# Patient Record
Sex: Female | Born: 1970
Health system: Southern US, Community
[De-identification: ages and names within clinical notes are randomized; demographics above are authoritative.]

## PROBLEM LIST (undated history)

## (undated) DIAGNOSIS — Z8614 Personal history of Methicillin resistant Staphylococcus aureus infection: Secondary | ICD-10-CM

## (undated) DIAGNOSIS — F429 Obsessive-compulsive disorder, unspecified: Secondary | ICD-10-CM

## (undated) DIAGNOSIS — D649 Anemia, unspecified: Secondary | ICD-10-CM

## (undated) DIAGNOSIS — I10 Essential (primary) hypertension: Secondary | ICD-10-CM

## (undated) DIAGNOSIS — M549 Dorsalgia, unspecified: Secondary | ICD-10-CM

## (undated) DIAGNOSIS — E1121 Type 2 diabetes mellitus with diabetic nephropathy: Secondary | ICD-10-CM

## (undated) DIAGNOSIS — E039 Hypothyroidism, unspecified: Secondary | ICD-10-CM

## (undated) DIAGNOSIS — Z8669 Personal history of other diseases of the nervous system and sense organs: Secondary | ICD-10-CM

## (undated) DIAGNOSIS — M79605 Pain in left leg: Secondary | ICD-10-CM

## (undated) DIAGNOSIS — M79604 Pain in right leg: Secondary | ICD-10-CM

## (undated) DIAGNOSIS — F319 Bipolar disorder, unspecified: Secondary | ICD-10-CM

## (undated) DIAGNOSIS — M1990 Unspecified osteoarthritis, unspecified site: Secondary | ICD-10-CM

## (undated) DIAGNOSIS — F32A Depression, unspecified: Secondary | ICD-10-CM

## (undated) DIAGNOSIS — K759 Inflammatory liver disease, unspecified: Secondary | ICD-10-CM

## (undated) DIAGNOSIS — E669 Obesity, unspecified: Secondary | ICD-10-CM

## (undated) DIAGNOSIS — K219 Gastro-esophageal reflux disease without esophagitis: Secondary | ICD-10-CM

## (undated) DIAGNOSIS — E079 Disorder of thyroid, unspecified: Secondary | ICD-10-CM

## (undated) DIAGNOSIS — F909 Attention-deficit hyperactivity disorder, unspecified type: Secondary | ICD-10-CM

## (undated) DIAGNOSIS — E119 Type 2 diabetes mellitus without complications: Secondary | ICD-10-CM

## (undated) DIAGNOSIS — M5430 Sciatica, unspecified side: Secondary | ICD-10-CM

## (undated) DIAGNOSIS — F329 Major depressive disorder, single episode, unspecified: Secondary | ICD-10-CM

## (undated) DIAGNOSIS — F419 Anxiety disorder, unspecified: Secondary | ICD-10-CM

## (undated) DIAGNOSIS — G8929 Other chronic pain: Secondary | ICD-10-CM

## (undated) DIAGNOSIS — F4010 Social phobia, unspecified: Secondary | ICD-10-CM

## (undated) DIAGNOSIS — L719 Rosacea, unspecified: Secondary | ICD-10-CM

## (undated) DIAGNOSIS — T83718A Erosion of other implanted mesh and other prosthetic materials to surrounding organ or tissue, initial encounter: Secondary | ICD-10-CM

## (undated) DIAGNOSIS — R300 Dysuria: Secondary | ICD-10-CM

## (undated) HISTORY — DX: Attention-deficit hyperactivity disorder, unspecified type: F90.9

## (undated) HISTORY — PX: SPINAL CORD STIMULATOR REMOVAL: SHX2423

## (undated) HISTORY — PX: EYE SURGERY: SHX253

## (undated) HISTORY — PX: BACK SURGERY: SHX140

## (undated) HISTORY — DX: Anxiety disorder, unspecified: F41.9

## (undated) HISTORY — PX: INCONTINENCE SURGERY: SHX676

## (undated) HISTORY — PX: SPINAL CORD STIMULATOR INSERTION: SHX5378

## (undated) SURGERY — ULTRASOUND, UPPER GI TRACT, ENDOSCOPIC
Anesthesia: Monitor Anesthesia Care | Laterality: Left

---

## 1998-01-12 ENCOUNTER — Ambulatory Visit (HOSPITAL_COMMUNITY): Admission: RE | Admit: 1998-01-12 | Discharge: 1998-01-13 | Payer: Self-pay | Admitting: Orthopaedic Surgery

## 1999-04-15 ENCOUNTER — Other Ambulatory Visit: Admission: RE | Admit: 1999-04-15 | Discharge: 1999-04-15 | Payer: Self-pay | Admitting: Obstetrics & Gynecology

## 1999-06-10 ENCOUNTER — Encounter: Payer: Self-pay | Admitting: Obstetrics and Gynecology

## 1999-06-10 ENCOUNTER — Ambulatory Visit (HOSPITAL_COMMUNITY): Admission: RE | Admit: 1999-06-10 | Discharge: 1999-06-10 | Payer: Self-pay | Admitting: Obstetrics and Gynecology

## 1999-10-31 ENCOUNTER — Inpatient Hospital Stay (HOSPITAL_COMMUNITY): Admission: AD | Admit: 1999-10-31 | Discharge: 1999-11-03 | Payer: Self-pay | Admitting: Obstetrics and Gynecology

## 1999-11-05 ENCOUNTER — Encounter: Admission: RE | Admit: 1999-11-05 | Discharge: 2000-01-11 | Payer: Self-pay | Admitting: Obstetrics and Gynecology

## 2001-05-04 ENCOUNTER — Ambulatory Visit (HOSPITAL_COMMUNITY): Admission: RE | Admit: 2001-05-04 | Discharge: 2001-05-04 | Payer: Self-pay | Admitting: Orthopaedic Surgery

## 2001-05-25 ENCOUNTER — Inpatient Hospital Stay (HOSPITAL_COMMUNITY): Admission: RE | Admit: 2001-05-25 | Discharge: 2001-05-26 | Payer: Self-pay | Admitting: Orthopaedic Surgery

## 2002-12-25 ENCOUNTER — Other Ambulatory Visit: Admission: RE | Admit: 2002-12-25 | Discharge: 2002-12-25 | Payer: Self-pay | Admitting: Obstetrics and Gynecology

## 2003-01-15 ENCOUNTER — Encounter: Payer: Self-pay | Admitting: Ophthalmology

## 2003-01-17 ENCOUNTER — Ambulatory Visit (HOSPITAL_COMMUNITY): Admission: RE | Admit: 2003-01-17 | Discharge: 2003-01-17 | Payer: Self-pay | Admitting: Ophthalmology

## 2003-01-17 ENCOUNTER — Encounter (INDEPENDENT_AMBULATORY_CARE_PROVIDER_SITE_OTHER): Payer: Self-pay | Admitting: *Deleted

## 2004-03-29 ENCOUNTER — Other Ambulatory Visit: Admission: RE | Admit: 2004-03-29 | Discharge: 2004-03-29 | Payer: Self-pay | Admitting: Obstetrics and Gynecology

## 2004-09-05 HISTORY — PX: TUBAL LIGATION: SHX77

## 2004-11-17 ENCOUNTER — Ambulatory Visit (HOSPITAL_COMMUNITY): Admission: RE | Admit: 2004-11-17 | Discharge: 2004-11-17 | Payer: Self-pay | Admitting: Obstetrics and Gynecology

## 2005-01-07 ENCOUNTER — Inpatient Hospital Stay (HOSPITAL_COMMUNITY): Admission: AD | Admit: 2005-01-07 | Discharge: 2005-01-08 | Payer: Self-pay | Admitting: Obstetrics and Gynecology

## 2005-03-07 ENCOUNTER — Inpatient Hospital Stay (HOSPITAL_COMMUNITY): Admission: AD | Admit: 2005-03-07 | Discharge: 2005-03-08 | Payer: Self-pay | Admitting: Obstetrics and Gynecology

## 2005-03-26 ENCOUNTER — Inpatient Hospital Stay (HOSPITAL_COMMUNITY): Admission: AD | Admit: 2005-03-26 | Discharge: 2005-03-29 | Payer: Self-pay | Admitting: Obstetrics and Gynecology

## 2005-03-28 ENCOUNTER — Encounter (INDEPENDENT_AMBULATORY_CARE_PROVIDER_SITE_OTHER): Payer: Self-pay | Admitting: *Deleted

## 2005-05-10 ENCOUNTER — Other Ambulatory Visit: Admission: RE | Admit: 2005-05-10 | Discharge: 2005-05-10 | Payer: Self-pay | Admitting: Obstetrics and Gynecology

## 2006-06-09 ENCOUNTER — Other Ambulatory Visit: Admission: RE | Admit: 2006-06-09 | Discharge: 2006-06-09 | Payer: Self-pay | Admitting: Obstetrics and Gynecology

## 2006-06-13 ENCOUNTER — Encounter: Admission: RE | Admit: 2006-06-13 | Discharge: 2006-06-13 | Payer: Self-pay | Admitting: Obstetrics and Gynecology

## 2006-09-05 HISTORY — PX: SPINAL FUSION: SHX223

## 2006-12-08 ENCOUNTER — Ambulatory Visit (HOSPITAL_COMMUNITY): Admission: RE | Admit: 2006-12-08 | Discharge: 2006-12-08 | Payer: Self-pay | Admitting: Ophthalmology

## 2007-01-15 ENCOUNTER — Encounter (INDEPENDENT_AMBULATORY_CARE_PROVIDER_SITE_OTHER): Payer: Self-pay | Admitting: Specialist

## 2007-01-15 ENCOUNTER — Ambulatory Visit (HOSPITAL_BASED_OUTPATIENT_CLINIC_OR_DEPARTMENT_OTHER): Admission: RE | Admit: 2007-01-15 | Discharge: 2007-01-15 | Payer: Self-pay | Admitting: Ophthalmology

## 2007-01-30 ENCOUNTER — Ambulatory Visit (HOSPITAL_COMMUNITY): Admission: RE | Admit: 2007-01-30 | Discharge: 2007-01-31 | Payer: Self-pay | Admitting: Neurosurgery

## 2007-02-19 ENCOUNTER — Encounter: Admission: RE | Admit: 2007-02-19 | Discharge: 2007-02-19 | Payer: Self-pay | Admitting: Neurosurgery

## 2007-02-27 ENCOUNTER — Ambulatory Visit (HOSPITAL_COMMUNITY): Admission: RE | Admit: 2007-02-27 | Discharge: 2007-03-01 | Payer: Self-pay | Admitting: Neurosurgery

## 2007-03-26 ENCOUNTER — Emergency Department (HOSPITAL_COMMUNITY): Admission: EM | Admit: 2007-03-26 | Discharge: 2007-03-26 | Payer: Self-pay | Admitting: Emergency Medicine

## 2007-04-12 ENCOUNTER — Encounter: Admission: RE | Admit: 2007-04-12 | Discharge: 2007-04-12 | Payer: Self-pay | Admitting: Neurological Surgery

## 2007-05-11 ENCOUNTER — Inpatient Hospital Stay (HOSPITAL_COMMUNITY): Admission: AD | Admit: 2007-05-11 | Discharge: 2007-05-14 | Payer: Self-pay | Admitting: Neurological Surgery

## 2007-06-19 ENCOUNTER — Encounter: Admission: RE | Admit: 2007-06-19 | Discharge: 2007-06-19 | Payer: Self-pay | Admitting: Neurological Surgery

## 2007-08-10 ENCOUNTER — Encounter: Admission: RE | Admit: 2007-08-10 | Discharge: 2007-08-10 | Payer: Self-pay | Admitting: Neurological Surgery

## 2007-09-06 HISTORY — PX: OTHER SURGICAL HISTORY: SHX169

## 2007-09-07 ENCOUNTER — Encounter: Admission: RE | Admit: 2007-09-07 | Discharge: 2007-09-07 | Payer: Self-pay | Admitting: Neurological Surgery

## 2007-11-13 ENCOUNTER — Encounter: Admission: RE | Admit: 2007-11-13 | Discharge: 2007-11-13 | Payer: Self-pay | Admitting: Neurological Surgery

## 2008-02-11 ENCOUNTER — Encounter: Admission: RE | Admit: 2008-02-11 | Discharge: 2008-02-11 | Payer: Self-pay | Admitting: Neurological Surgery

## 2008-05-05 ENCOUNTER — Encounter: Admission: RE | Admit: 2008-05-05 | Discharge: 2008-05-05 | Payer: Self-pay | Admitting: Neurological Surgery

## 2008-12-01 ENCOUNTER — Emergency Department (HOSPITAL_COMMUNITY): Admission: EM | Admit: 2008-12-01 | Discharge: 2008-12-01 | Payer: Self-pay | Admitting: Emergency Medicine

## 2008-12-10 ENCOUNTER — Other Ambulatory Visit: Payer: Self-pay

## 2008-12-10 ENCOUNTER — Inpatient Hospital Stay (HOSPITAL_COMMUNITY): Admission: RE | Admit: 2008-12-10 | Discharge: 2008-12-12 | Payer: Self-pay | Admitting: *Deleted

## 2008-12-10 ENCOUNTER — Ambulatory Visit: Payer: Self-pay | Admitting: *Deleted

## 2009-02-15 ENCOUNTER — Emergency Department: Payer: Self-pay | Admitting: Unknown Physician Specialty

## 2010-09-27 ENCOUNTER — Encounter
Admission: RE | Admit: 2010-09-27 | Discharge: 2010-09-27 | Payer: Self-pay | Source: Home / Self Care | Attending: Orthopaedic Surgery | Admitting: Orthopaedic Surgery

## 2010-10-30 ENCOUNTER — Emergency Department (HOSPITAL_COMMUNITY)
Admission: EM | Admit: 2010-10-30 | Discharge: 2010-10-30 | Disposition: A | Discharge status: Home / Self Care | Payer: Medicare Other | Attending: Emergency Medicine | Admitting: Emergency Medicine

## 2010-10-30 ENCOUNTER — Emergency Department (HOSPITAL_COMMUNITY): Payer: Medicare Other

## 2010-10-30 DIAGNOSIS — R0602 Shortness of breath: Secondary | ICD-10-CM | POA: Insufficient documentation

## 2010-10-30 DIAGNOSIS — IMO0002 Reserved for concepts with insufficient information to code with codable children: Secondary | ICD-10-CM | POA: Insufficient documentation

## 2010-10-30 DIAGNOSIS — F341 Dysthymic disorder: Secondary | ICD-10-CM | POA: Insufficient documentation

## 2010-10-30 DIAGNOSIS — J40 Bronchitis, not specified as acute or chronic: Secondary | ICD-10-CM | POA: Insufficient documentation

## 2010-10-30 DIAGNOSIS — R05 Cough: Secondary | ICD-10-CM | POA: Insufficient documentation

## 2010-10-30 DIAGNOSIS — K219 Gastro-esophageal reflux disease without esophagitis: Secondary | ICD-10-CM | POA: Insufficient documentation

## 2010-10-30 DIAGNOSIS — R059 Cough, unspecified: Secondary | ICD-10-CM | POA: Insufficient documentation

## 2010-12-15 LAB — DIFFERENTIAL
Basophils Absolute: 0.2 10*3/uL — ABNORMAL HIGH (ref 0.0–0.1)
Basophils Relative: 1 % (ref 0–1)
Eosinophils Absolute: 0.2 10*3/uL (ref 0.0–0.7)
Eosinophils Relative: 1 % (ref 0–5)
Lymphocytes Relative: 30 % (ref 12–46)
Lymphs Abs: 3.9 10*3/uL (ref 0.7–4.0)
Monocytes Absolute: 0.7 10*3/uL (ref 0.1–1.0)
Monocytes Relative: 6 % (ref 3–12)
Neutro Abs: 8 10*3/uL — ABNORMAL HIGH (ref 1.7–7.7)
Neutrophils Relative %: 62 % (ref 43–77)

## 2010-12-15 LAB — URINE MICROSCOPIC-ADD ON

## 2010-12-15 LAB — URINALYSIS, ROUTINE W REFLEX MICROSCOPIC
Bilirubin Urine: NEGATIVE
Glucose, UA: NEGATIVE mg/dL
Hgb urine dipstick: NEGATIVE
Ketones, ur: NEGATIVE mg/dL
Nitrite: NEGATIVE
Protein, ur: NEGATIVE mg/dL
Specific Gravity, Urine: 1.009 (ref 1.005–1.030)
Urobilinogen, UA: 0.2 mg/dL (ref 0.0–1.0)
pH: 6 (ref 5.0–8.0)

## 2010-12-15 LAB — BASIC METABOLIC PANEL
BUN: 7 mg/dL (ref 6–23)
CO2: 28 mEq/L (ref 19–32)
Calcium: 9.7 mg/dL (ref 8.4–10.5)
Chloride: 102 mEq/L (ref 96–112)
Creatinine, Ser: 0.73 mg/dL (ref 0.4–1.2)
GFR calc Af Amer: >60 mL/min (ref >60)
GFR calc non Af Amer: >60 mL/min (ref >60)
Glucose, Bld: 100 mg/dL — ABNORMAL HIGH (ref 70–99)
Potassium: 4.4 mEq/L (ref 3.5–5.1)
Sodium: 137 mEq/L (ref 135–145)

## 2010-12-15 LAB — CBC
HCT: 42.9 % (ref 36.0–46.0)
Hemoglobin: 14.4 g/dL (ref 12.0–15.0)
MCHC: 33.5 g/dL (ref 30.0–36.0)
MCV: 80.9 fL (ref 78.0–100.0)
Platelets: 409 10*3/uL — ABNORMAL HIGH (ref 150–400)
RBC: 5.3 MIL/uL — ABNORMAL HIGH (ref 3.87–5.11)
RDW: 17.4 % — ABNORMAL HIGH (ref 11.5–15.5)
WBC: 12.9 10*3/uL — ABNORMAL HIGH (ref 4.0–10.5)

## 2010-12-15 LAB — RAPID URINE DRUG SCREEN, HOSP PERFORMED
Amphetamines: NOT DETECTED
Barbiturates: NOT DETECTED
Benzodiazepines: POSITIVE — AB
Cocaine: NOT DETECTED
Opiates: POSITIVE — AB
Tetrahydrocannabinol: NOT DETECTED

## 2010-12-15 LAB — ETHANOL: Alcohol, Ethyl (B): <5 mg/dL (ref 0–10)

## 2010-12-15 LAB — POCT PREGNANCY, URINE: Preg Test, Ur: NEGATIVE

## 2011-01-18 NOTE — Op Note (Signed)
NAMEKIRSTINE, [name]                ACCOUNT NO.:  [id number]   MEDICAL RECORD NO.:  [id number]          PATIENT TYPE:  INP   LOCATION:  3172                         FACILITY:  [facility]   PHYSICIAN:  Tia Alert, MD     DATE OF BIRTH:  [birth year]   DATE OF PROCEDURE:  05/11/2007  DATE OF DISCHARGE:                               OPERATIVE REPORT   PREOPERATIVE DIAGNOSES:  1. Failed back syndrome.  2. Degenerative disease L4-5, L5-S1.  3. Recurrent lumbar disk herniation.  4. Back and leg pain.   POSTOPERATIVE DIAGNOSES:  1. Failed back syndrome.  2. Degenerative disease L4-5, L5-S1.  3. Recurrent lumbar disk herniation.  4. Back and leg pain.   PROCEDURES:  1. Redo lumbar decompressive laminectomy, hemifacetectomy and      foraminotomies at L4-5 and L5-S1 bilaterally for decompression of      the L4, L5 and S1 nerve roots.  2. Posterior lumbar interbody fusion L4-5, L5-S1 utilizing 8 mm      tangent interbody bone wedges and 8 mm PEEK interbody cages packed      with local autograft and Actifuse.  3. Intertransverse arthrodesis L4 to S1 bilaterally utilizing locally      harvested morselized autologous bone graft and Actifuse  4. Segmental fixation L4 to S1 bilaterally utilizing the Stryker      radius pedicle screw system.   SURGEON:  Dr. Marikay Alar.   ASSISTANT:  Dr. Altamease Oiler.   ANESTHESIA:  General endotracheal.   COMPLICATIONS:  None apparent.   INDICATIONS FOR PROCEDURE:  Ms. [name] is a 40 year old female who had  undergone two previous microdiskectomies, one at L4-5 and one at L5-S1  by another surgeon about 7 or 8 years ago.  She has had significant back  pain since that time with progressively worsening diskogenic pain with  some leg pain.  She underwent a spinal cord stimulator trial and failed  this and had to have it removed. I reviewed her films and felt that she  had significant disk desiccation. She had a CT myelogram and an MRI  which suggested  significant disk desiccation at L4-5 and L5-S1 with some  underfilling of the left S1 nerve root with recurrent disk herniation  and significant spondylosis and facet arthrosis.  I recommended a two-  level decompressive laminectomy and instrumented fusion.  She understood  the risks, benefits, and expected outcome and wished to proceed.   DESCRIPTION OF PROCEDURE:  The patient was taken to operating room and  after induction of adequate generalized endotracheal anesthesia, she was  rolled in the  prone position on chest rolls and all pressure points  were padded.  Her lumbar region was prepped with DuraPrep and then  draped in the usual sterile fashion. 10 mL of local anesthesia was  injected and her old incision was ellipsed out. The incision was then  carried down to the fascia.  The lumbosacral fascia was then opened and  the musculature was taken down in subperiosteal fashion to expose L4-5  and L5-S1. Once intraoperative fluoroscopy confirmed my levels, I took  the dissection out over the facets to expose the transverse processes of  L4 and L5 and the sacrum.  I then used a combination of Leksell rongeur  and Kerrison punches to perform complete laminectomies,  hemifacetectomies and foraminotomies at L4-5 and L5-S1.  There was  significant scarring at L4-5 on the right and L5-S1 on the left. I spent  considerable time tethering the scar from the bony edges and then  completing the hemifacetectomy. Wide foraminotomies were performed, the  nerve roots were well decompressed into their respective foramina and  once my decompression was complete, I began the posterior interbody  fusion by incising the disk space bilaterally at L5-S1distracting the  disk space up to 8 mm and then using a combination of the rotating  cutter, curettes and 8 mm cutting chisel to prepare the endplates  bilaterally at L5-S1.  We then used an 8 mm tangent interbody bone wedge  and an 8 mm Peak interbody cage  packed with local autograft and Actifuse  to perform the posterior lumbar interbody fusion at L5-S1. The midline  was packed with local autograft and Actifuse. The same procedure was  performed at L4-5, the annulus was incised. The initial diskectomy was  done with pituitary rongeurs and then we distracted the disk space up to  8 mm and then used a combination of the rotating cutter, round scraper  and cutting chisel to prepare the endplates. The midline was once again  packed with local autograft and Actifuse and we used a combination of  the 8 mm PEEK interbody cage and the tangent interbody bone wedge to  complete our PLIF at L4-5.  We then localized the pedicle screw entry  zones at L4, L5 and S1.  We probed each pedicle with a pedicle probe,  tapped each pedicle with a 5/5 tap and then placed 6.75 x 40 mm pedicle  screws in the L4 pedicles and L5 pedicles bilaterally and 6.75 x 30 mm  pedicle screws in the sacral pedicles bilaterally.  We then decorticated  the transverse processes and placed a mixture of local autograft and  Actifuse out over these to perform intertransverse arthrodesis L4 to S1.  We then placed lordotic rods into the multiaxial screw heads of the  pedicle screws and locked these into position with the locking caps and  the anti-torque device while achieving compression on our grafts.  I  then irrigated with saline solution containing bacitracin, dried all  bleeding points as best I could, placed a medium Hemovac drain through a  separate incision, lined the dura with Gelfoam and then closed the  muscle and then the fascia with #0 Vicryl, closed the subcutaneous and  subcuticular tissues with 2-0 and 3-0 Vicryl, and closed the skin with  Benzoin and Steri-Strips.  The drapes were removed, a sterile dressing  was applied.  The patient was awakened from general anesthesia and  transported to the recovery room in stable condition.  At the end of the  procedure all  sponge, needle and instrument counts were correct.      Tia Alert, MD  Electronically Signed     DSJ/MEDQ  D:  05/11/2007  T:  05/11/2007  Job:  [id number]

## 2011-01-18 NOTE — Discharge Summary (Signed)
[name], [name]                ACCOUNT NO.:  [id number]   MEDICAL RECORD NO.:  [id number]          PATIENT TYPE:  INP   LOCATION:  3038                         FACILITY:  [facility]   PHYSICIAN:  Tia Alert, MD     DATE OF BIRTH:  [birth year]   DATE OF ADMISSION:  05/11/2007  DATE OF DISCHARGE:  05/14/2007                               DISCHARGE SUMMARY   ADMISSION DIAGNOSIS:  Failed back syndrome with degenerative disease at  L4-5, L5-S1.   PROCEDURES:  Posterior lumbar interbody fusion L4-5, L5-S1.   HISTORY OF PRESENT ILLNESS:  [name] is a very pleasant, 40-year-  old female who had undergone two previous back surgeries by Dr. Ophelia Charter.  She had progressive low back pain.  Since that time, she underwent a  spinal cord stimulator placement which failed and actually became  infected.  She saw me as a second opinion.  She was found to have on MRI  and then CT myelogram significant degenerative disk disease with Modic  end-plate changes at L4-5 and L5-S1.  She complained of diskogenic back  pain.  She had facet arthropathy at the  two-levels with recurrent disk  bulges.  I recommended a two-level lumbar fusion.  She understood the  risks, benefits and expected outcome and wished to proceed.   HOSPITAL COURSE:  The patient was admitted on May 11, 2007, and  taken to the operating room where she underwent a two-level PLIF at L4-  5, L5-S1.  The patient tolerated the procedure well and was taken to the  recovery room and then to the floor in stable condition.  For details of  the operative procedure, please see the dictated operative note.  The  patient's hospital course was routine.  There were no complications.  She spent the first night at bed rest with a Foley catheter in place and  Dilaudid PCA for pain control.  The next day, she was up ambulating  without difficulty with the therapist.  She had appropriate back  soreness, but no more leg pain.  She had good strength in  all her  extremities.  Her Foley catheter was removed.  She was taken off her  Dilaudid PCA.  Her pain became more controlled on oral pain medications.  She remained afebrile with stable vital signs.  Her incision remained  clean, dry and intact.  She was discharged home in stable condition on  May 14, 2007, with plans to follow up with Dr. Yetta Barre in 2 weeks.   DISCHARGE MEDICATIONS:  1. Percocet.  2. Flexeril.   DISCHARGE DIAGNOSIS:  Posterior lumbar interbody fusion L4-5, L4-S1.      Tia Alert, MD  Electronically Signed     DSJ/MEDQ  D:  05/14/2007  T:  05/14/2007  Job:  [id number]

## 2011-01-18 NOTE — H&P (Signed)
[name], [name]                ACCOUNT NO.:  [id number]   MEDICAL RECORD NO.:  [id number]          PATIENT TYPE:  IPS   LOCATION:  0307                          FACILITY:  [facility]   PHYSICIAN:  Jasmine Pang, M.D. DATE OF BIRTH:  06-11-1971   DATE OF ADMISSION:  12/10/2008  DATE OF DISCHARGE:                       PSYCHIATRIC ADMISSION ASSESSMENT   A 40 year old female voluntarily admitted on December 10, 2008.   HISTORY OF PRESENT ILLNESS:  The patient reports a history of  depression, feeling very hopeless because of her chronic pain.  She  states that she can't take it anymore, was thinking about suicide but  states that she will not do it because of her children.  Again, having  thoughts about not wanting to live.  She has been experiencing panic  attacks again and depression and feels that she is not getting the help  that she needs due to her chronic pain.   PAST PSYCHIATRIC HISTORY:  First admission to [facility].  No current outpatient mental health treatment.  Currently on Effexor.   SOCIAL HISTORY:  She is a divorced female, has 2 children, ages 8 and 21.  Patient is on disability.   FAMILY HISTORY:  None.   ALCOHOL AND DRUG HISTORY:  Denies any alcohol or drug use.   PRIMARY CARE Reilley Valentine:  Was going to the [facility].  They  were tapering her methadone.  Her primary care Shaman Muscarella is [facility], sees Primus Bravo.   MEDICAL PROBLEMS:  1. History of back pain with back surgeries x5.  2. Hypothyroidism.  3. Heartburn.  4. States she gets multiple urinary tract infections.   MEDICATIONS LISTED:  1. Effexor 100 mg t.i.d.  2. Xanax 1 mg t.i.d.  3. Vitamin D weekly.  4. Ibuprofen 600 mg t.i.d.  5. Synthroid 50 mcg daily.  6. Again, her methadone was being tapered.   DRUG ALLERGIES:  NO KNOWN ALLERGIES.   PHYSICAL EXAM:  This is an overweight female.  She is very tearful,  complaining of pain.  She was assessed at  [facility] and her physical  exam is reviewed.  No significant findings.  Temperature of 97.7, 81  heart rate, 18 respirations, blood pressure is 150/87, 234 pounds, 5  feet 4 inches tall.   LABORATORY DATA:  Shows an alcohol level less than 5.  Urine pregnancy  test is negative.  WBC count is 12.9.  Urine drug screen is positive for  opiates and positive for benzodiazepines.   MENTAL STATUS EXAM:  She is fully alert and cooperative, somewhat  disheveled.  Her eye contact is fair.  Her speech is slow, soft.  She is  depressed and anxious, sobbing.  Thought processes are coherent and  within normal limits.  Her judgment is fair.  Insight is present.   AXIS I:  Depressive disorder, NOS.  AXIS II:  Deferred.  AXIS III:  1. Chronic back pain.  2. Hyperthyroidism.  3. GERD.  AXIS IV:  Medical problems, possible problems with access to health care  services.  AXIS V:  Current is 35.  PLAN:  Stabilize mood and thinking.  We will contract for safety.  We  will continue to assess other comorbidities.  We will continue with  patient's Effexor.  We will put patient on the clonidine protocol to  help with withdrawal symptoms from opiate use and also put patient on a  Librium protocol, detox off the Xanax which was discussed with the  patient, patient is agreeable.  We will also offer Seroquel on a p.r.n.  basis for anxiety and agitation and some mood stabilization.  Patient  will follow up with a primary care Maelynn Moroney.  We will attempt to make an  appointment for possible referral and management of her medical issues.   TENTATIVE LENGTH OF STAY:  At this time, is 2 to 3 days.      Landry Corporal, N.P.      Jasmine Pang, M.D.  Electronically Signed    JO/MEDQ  D:  12/12/2008  T:  12/12/2008  Job:  [id number]

## 2011-01-18 NOTE — Op Note (Signed)
[name], [name]                ACCOUNT NO.:  [id number]   MEDICAL RECORD NO.:  [id number]          PATIENT TYPE:  OIB   LOCATION:  3172                         FACILITY:  [facility]   PHYSICIAN:  Reinaldo Meeker, M.D. DATE OF BIRTH:  [birth year]   DATE OF PROCEDURE:  01/30/2007  DATE OF DISCHARGE:                               OPERATIVE REPORT   PREOPERATIVE DIAGNOSIS:  Failed back syndrome.   POSTOPERATIVE DIAGNOSIS:  Failed back syndrome.   PROCEDURE:  Permanent spinal stimulator placement and stimulator battery  placement   SURGEON:  Dr. Gerlene Fee.   PROCEDURE IN DETAIL:  After placed in the prone position, the patient's  back in the thoracic region, lumbar region and down to the left buttock  region, was prepped and draped in usual sterile fashion.  Localizing  fluoroscopy was used to identify the appropriate level.  Midline  incision made over the spinous process of approximately T10 and T11.  Using Bovie cutting current, the incision was carried down the spinous  processes.  Subperiosteal dissection was then carried out on the left-  sided spinous processes and lamina.  Self retractor was placed for  exposure.  X-rays showed approach to be good position.  The lamina of  T10 was then removed on the left side.  Ligamentum flavum and epidural  tissue removed until the thoracic spinal dura was well exposed.  Some  trial passing of the lead was carried out until was found to have been  passed into good position.  Incision was then made in the left buttock  carried down into the subcutaneous fat.  A pocket was made for the  battery and sized appropriately.  A passer was then passed from inferior  to superior direction and the lead passed through the passer without  difficulty.  Lead was then passed into good position until this was  covering the lower part of T8.  The entire T8-9 disk space region of T9,  the T9-T10 region.  It was well placed in the midline.  The wounds were  irrigated and bleeding controlled with Gelfoam.  The lead was then  attached to the battery and testing showed all leads to be in good  contact.  The battery was then placed in the buttock pouch with out in  the difficulty and the wound was irrigated copiously.  Both wounds were  closed with interrupted Vicryl on the fascia, subcutaneous subcu tissues  and staples were placed on the skin.  Sterile dressings were then  applied.  The patient was extubated, taken to recovery room in stable  condition.           ______________________________  Reinaldo Meeker, M.D.     ROK/MEDQ  D:  01/30/2007  T:  01/30/2007  Job:  [id number]

## 2011-01-18 NOTE — Op Note (Signed)
[name], [name]                ACCOUNT NO.:  [id number]   MEDICAL RECORD NO.:  [id number]          PATIENT TYPE:  AMB   LOCATION:  SDS                          FACILITY:  [facility]   PHYSICIAN:  Salley Scarlet., M.D.DATE OF BIRTH:  1971/08/28   DATE OF PROCEDURE:  01/19/2007  DATE OF DISCHARGE:                               OPERATIVE REPORT   PREOPERATIVE DIAGNOSIS:  Cyst, conjunctiva medial aspect, right eye.   POSTOPERATIVE DIAGNOSIS:  Cyst, conjunctiva medial aspect, right eye.   OPERATION:  Cyst excision.   ANESTHESIA:  Local using Xylocaine 1%.   PROCEDURE:  This is a 40 year old lady who recently underwent pterygium  excision from her right eye.  During the postoperative period, she  developed a cystic lesion posterior to the surgical site.  This lesion  was very uncomfortable and she requested it be excised.  She is  therefore admitted for that purpose.   PROCEDURE:  The the patient was prepped and draped in the usual manner.  The conjunctiva was infiltrated with 7 mL of Xylocaine in the region of  the cyst which was located just lateral to the medial canthus. Using  sharp and blunt dissection the lesion was excised in toto.  The  conjunctiva was reapproximated using an 8-0 roll Vicryl suture.  Polysporin ophthalmic ointment and pressure patch was applied.  The  patient tolerated the procedure well and was discharged to post  anesthesia recovery room in satisfactory condition.   She is instructed to remove the patch tomorrow ,to resume her drops four  times a day and see me in office in one week.   DISCHARGE DIAGNOSIS:  Conjunctival cyst, right eye.      Salley Scarlet., M.D.  Electronically Signed     TB/MEDQ  D:  01/20/2007  T:  01/20/2007  Job:  [id number]

## 2011-01-18 NOTE — Op Note (Signed)
NAMEJOSLYNE, [name]                ACCOUNT NO.:  [id number]   MEDICAL RECORD NO.:  [id number]          PATIENT TYPE:  OIB   LOCATION:  3035                         FACILITY:  [facility]   PHYSICIAN:  Reinaldo Meeker, M.D. DATE OF BIRTH:  December 11, 1970   DATE OF PROCEDURE:  02/27/2007  DATE OF DISCHARGE:                               OPERATIVE REPORT   PREOPERATIVE DIAGNOSIS:  Infected spinal stimulator battery.   POSTOPERATIVE DIAGNOSIS:  Infected spinal stimulator battery.   PROCEDURE:  Removal of spinal stimulator and spinal stimulator battery.   SURGEON:  Reinaldo Meeker, M.D.   PROCEDURE IN DETAIL:  After being placed in the prone position the  patient's buttock incision was reopened.  There was purulent material  was immediately encountered and cultured both aerobically and  anaerobically and sent for Gram stain.  The stimulator battery was then  withdrawn from the pocket.  Gentle pressure was used to try and bring  the entire lead out through the buttock incision.  This was done without  any difficulty and no resistance was met.  The lead and wire and battery  were removed intact.  The buttock incision was then irrigated copiously  with antibiotic irrigation and the necrotic looking tissue was removed.  The wound was then closed with interrupted Vicryl in the subcutaneous  and subcuticular tissues and Dermabond on the skin.  A sterile dressing  was then applied.  The patient was extubated, taken to recovery room in  stable condition.           ______________________________  Reinaldo Meeker, M.D.     ROK/MEDQ  D:  02/27/2007  T:  02/28/2007  Job:  [id number]

## 2011-01-21 NOTE — Op Note (Signed)
[name], [name]                ACCOUNT NO.:  [id number]   MEDICAL RECORD NO.:  [id number]          PATIENT TYPE:  INP   LOCATION:  9121                          FACILITY:  [facility]   PHYSICIAN:  Crist Fat. Rivard, M.D. DATE OF BIRTH:  November 07, 1970   DATE OF PROCEDURE:  03/28/2005  DATE OF DISCHARGE:                                 OPERATIVE REPORT   PREOPERATIVE DIAGNOSIS:  Desire for sterilization.   POSTOPERATIVE DIAGNOSIS:  Desire for sterilization.   ANESTHESIA:  Epidural, Dr. Jean Rosenthal.   PROCEDURE:  Postpartum bilateral tubal ligation.   SURGEON:  Dr. Estanislado Pandy.   ESTIMATED BLOOD LOSS:  Minimal.   PROCEDURE:  After being informed of the planned procedure with possible  complications including, irreversibility, failure rate of 1 in 1000,  bleeding, infection, and injury to other organs, informed consent is  obtained.  The patient is taken to OR #3 and given epidural anesthesia  through the previously placed epidural catheter.  She is placed in the  dorsal decubitus position, prepped and draped in a sterile fashion.  After  assessing adequate level of anesthesia, the umbilical area is infiltrated  with 10 mL of Marcaine 0.25, and we perform a semi-elliptical incision which  is brought down to the fascia.  Fascia is visually identified and grasped  with Allis forceps, and fascia is entered with Mayo scissors.  Peritoneum is  then identified, grasped with two hemostat clamps, and entered sharply.  We  are then able to perform the bilateral tubal ligation by identifying each  tube, grasping each tube with two Babcock forceps until the fimbrial end is  fully exteriorized.  Each tube is then doubly ligated, and a section of 1.5  cm is removed from the isthmi ampullary area.  Both stumps are doubly  ligated with 0 chromic.  Both stumps are then cauterized.  Hemostasis is  adequate on both sides.  Please note that some fimbrial bleeding on the left  tube was cauterized, and hemostasis was  adequate.   The fascia is then closed with a running suture of 0 Vicryl, and skin is  closed with a subcuticular suture of 4-0 Monocryl and Steri-Strips.   Instruments and sponge count is complete x 2.  Estimated blood loss is  minimal.  Procedure is well-tolerated by the patient, who is taken to  recovery room in a well and stable condition.       SAR/MEDQ  D:  03/28/2005  T:  03/28/2005  Job:  [id number]

## 2011-01-21 NOTE — Discharge Summary (Signed)
NAMEGERRIANNE, [name]                ACCOUNT NO.:  [id number]   MEDICAL RECORD NO.:  [id number]          PATIENT TYPE:  OIB   LOCATION:  3035                         FACILITY:  [facility]   PHYSICIAN:  Reinaldo Meeker, M.D. DATE OF BIRTH:  06/14/1971   DATE OF ADMISSION:  02/27/2007  DATE OF DISCHARGE:  03/01/2007                               DISCHARGE SUMMARY   PRIMARY DIAGNOSIS:  Infected spinal stimulator battery pack.   FINAL DIAGNOSIS:  Infected spinal stimulator battery pack.   PROCEDURE:  Removal of spinal stimulator.   HISTORY:  Ms. [name] is a 40 year old female who had a spinal  stimulator placed approximately ten days prior to this admission.  Now,  she has gross purulent discharge from the battery pocket and complains  of headache as well.  She is admitted this time for removal of her  spinal stimulator system.  She was not very happy with the results of  the stimulator anyway and it will not be put back at anytime in the  future.  On June 24, the patient was taken to the operating room where  she underwent the above-mentioned procedure.  The entire system was able  to be removed through the buttock pouch incision.  She tolerated the  procedure well over subsequent days; she was able to increase her  activities and advance her diet without difficulty.  A PICC line was  started so that she could have home antibiotics with Vancomycin.  By  June 26, she was up ambulating well.  A PICC line had been placed.  She  was ready for discharge.   DISCHARGE MEDICATIONS:  Vancomycin, pain medication.   Her condition was markedly improved versus admission.  Her buttock  incision appeared to be healing quite well at this time.   Plans are for her to follow up in the office in approximately four days'  time for a wound check.  She was instructed to call us if she had any  issues regarding her antibiotic treatment.           ______________________________  Reinaldo Meeker,  M.D.     ROK/MEDQ  D:  04/19/2007  T:  04/19/2007  Job:  [id number]

## 2011-01-21 NOTE — Discharge Summary (Signed)
[name], Kim                ACCOUNT NO.:  [id number]   MEDICAL RECORD NO.:  [id number]          PATIENT TYPE:  IPS   LOCATION:  0307                          FACILITY:  [facility]   PHYSICIAN:  Jasmine Pang, M.D. DATE OF BIRTH:  [birth year]   DATE OF ADMISSION:  12/10/2008  DATE OF DISCHARGE:  12/12/2008                               DISCHARGE SUMMARY   IDENTIFICATION:  This is a 40 year old divorced female who was admitted  on a voluntary basis on December 10, 2008.   HISTORY OF PRESENT ILLNESS:  The patient reports a history of  depression.  She states she is feeling very hopeless because of her  chronic pain.  She states that she cannot take it anymore.  She was  thinking about suicide, but states she will not do this because of her  children.  Again, she was having thoughts about not wanting to live.  She has been experiencing panic attacks and depression, and feels she is  not getting the help that she needs for her chronic pain.  For further  admission information, see psychiatric admission assessment.   PHYSICAL FINDINGS:  There were no acute physical or medical problems  noted.   LABORATORY DATA:  Shows an alcohol level of less than 5.  Urine  pregnancy test was negative.  WBC count was 12.9.  Urine drug screen was  positive for opiates and positive for benzodiazepines.   HOSPITAL COURSE:  Upon admission, the patient was restarted on her home  medications of venlafaxine HCl 100 mg 1 tablet three times daily, Xanax  1 mg 1 tablet three times daily, ibuprofen 600 mg 1 tablet three times a  day, and Synthroid 50 mcg p.o. 1 tablet in the morning.  She was started  on Ambien 10 mg p.o. q.h.s. p.r.n. insomnia.  She was also started on  clonidine detox protocol and Librium detox protocol and she was started  on Seroquel 50 mg p.o. t.i.d. p.r.n. anxiety and agitation.  Xanax was  discontinued with the start of the Librium detox protocol.  In  individual sessions, the patient was  initially disheveled with fair eye  contact.  There was positive psychomotor retardation.  Speech was soft  and slow.  Mood was depressed and anxious.  Affect consistent with mood.  There was no evidence of thought disorder or psychosis.  She  participated appropriately in unit therapeutic groups and activities.  On December 12, 2008, the patient wanted to go home.  Her sleep was good.  Appetite was good.  Mood was less depressed and less anxious.  Affect  consistent with mood.  There was no suicidal or homicidal ideation.  No  thoughts of self-injurious behavior.  No auditory or visual  hallucinations.  No paranoia or delusions.  Thoughts were logical and  goal-directed.  Thought content no predominant theme.  Cognitive grossly  intact.  Insight good.  Judgment good.  Impulse control good.  It was  felt the patient was safe for discharge today.   DISCHARGE DIAGNOSES:  Axis I:  Mood disorder, not otherwise specified.  Axis II:  None.  Axis III:  Chronic back pain, hypothyroidism, and gastroesophageal  reflux disease.  Axis IV:  Severe (medical problems, possible problems with access to  healthcare services, burden of psychiatric illness).  Axis V:  Global assessment of functioning was 50 upon discharge.  GAF  was 35 upon admission.  GAF was 65 highest past year.   DISCHARGE PLANS:  There was no specific activity level or dietary  restrictions.   POSTHOSPITAL CARE PLANS:  The patient will go to the [facility] on April 15th at 9 o'clock a.m.  She has a an  appointment with [facility] on April 20th at 10:45 a.m. for followup  medical issues.   DISCHARGE MEDICATIONS:  1. Effexor 100 mg t.i.d.  2. Synthroid 50 mcg daily.  3. Vitamin D as prescribed.  4. Ibuprofen as directed.  5. Librium taper will be continued.  6. She was not continued on the clonidine detox protocol.      Jasmine Pang, M.D.  Electronically Signed     BHS/MEDQ  D:  12/28/2008  T:   12/29/2008  Job:  [id number]

## 2011-01-21 NOTE — Op Note (Signed)
[name]. [facility]  Patient:    [name], [name] Visit Number: [id number] MRN: [id number]          Service Type: SUR Location: 5000 [id number] Attending Physician:  Jacki Cones Dictated by:   Veverly Fells Ophelia Charter, M.D. Proc. Date: 05/25/01 Admit Date:  05/25/2001                             Operative Report  PREOPERATIVE DIAGNOSIS:  L4-5 herniated nucleus pulposus right, L5-S1 herniated nucleus pulposus left.  POSTOPERATIVE DIAGNOSIS:  L4-5 herniated nucleus pulposus right, L5-S1 herniated nucleus pulposus left.  PROCEDURE:  L4-5 redo microdiskectomy, right L4-5; left L5-S1 microdiskectomy.  SURGEON:  Mark C. Ophelia Charter, M.D.  ASSISTANTS:  Humberto Leep. Wyonia Hough, M.D., and Zonia Kief, P.A.-C.  ANESTHESIA:  GOT.  ESTIMATED BLOOD LOSS:  100 ml.  DESCRIPTION OF PROCEDURE:  After induction of general anesthesia, patient was placed on the Andrews frame and preoperative Ancef was given prophylactically. She was placed in the kneeling position with careful padding and positioning. Back was prepped with Duraprep, area was scrubbed with towels, and sterile skin marker used on the old microdiskectomy incision at L4-5 and Betadine Vi-Drape applied.  Midline incision was made.  Dissection was performed on the right at L4-5 and the left at L5-S1.  Crosstable lateral x-ray was taken with a #4 Penfield at the L4-5 level, confirming this was the appropriate level. Laminotomy was enlarged, going cephalad.  The lateral gutters were debrided. Dissection in the normal area cephalad was performed, identifying the dura and then down to the L4 vertebral body floor.  This was followed distally, dissecting the scar tissue off the nerve root.  The nerve root was significantly displaced and adherent.  Using a nerve hook underneath the nerve root, a piece of disk material was mobilized and then gently grasped using micropituitaries, teasing a large free fragment loose.  Continued  dissection was performed, and this had loosened up the nerve root so the nerve root could be dissected away.  It was followed all the way past the pedicle and out the foramina.  The foramina were enlarged, and two other large chunks of free fragment disk material were removed.  There was disk material above and below the level of the disk, and this was removed with the large pituitaries and Kerrison rongeur and Epstein curettes.  Some disk was found also in the midline and using a combination of the hockey stick and Epstein curettes, disk was teased from the midline off to the right side and then removed.  Hockey stick could be taken through 180 degrees sweep, and the opposite pedicle was palpated with no areas of compression left.  Foramina were enlarged.  After irrigation with saline solution, attention was then turned to the L5-S1 level on the left.  At this level minimal laminotomy had to be performed.  There was some facet overhang, which had to be trimmed back to the level of the pedicle. A large bulging disk was present.  As soon as the posterior longitudinal ligament was incised, several large clumps of disk were removed.  These were all retained by the ligament.  There was no free fragment, and the disk was central and left-side.  Using Epstein curettes, up and down pituitaries, micropituitaries, the disk herniation was decompressed until the disk was flat.  Nerve root takeoff was slightly high, and the level of the disk actually was in the axilla of  the nerve root.  Once the disk was decompressed, the nerve root could easily be mobilized laterally, inspection of the axilla showing that it was clear, and out the foramina with no areas of compression. Irrigation with saline solution was performed.  Final sweeps were made of both levels with 180 degree sweeps made anterior to the dura with no areas of compression, no free fragments, and the foramina were wide open.  The fascia was  closed with 0 Vicryl, 2-0 Vicryl subcutaneous tissue, and 4-0 Vicryl subcuticular closure.  Tincture of benzoin and Steri-Strips, 4 x 4 and tape dressing.  The patient was transferred to the recovery room in stable condition.  Instrument count and needle count were correct.  As the patient was being extubated, NG tube was being removed and the patient bit down and bit the small tip of the NG tube.  Patient experienced no airway problems. Dictated by:   Veverly Fells Ophelia Charter, M.D. Attending Physician:  Jacki Cones DD:  05/25/01 TD:  05/25/01 Job: [id number]

## 2011-01-21 NOTE — H&P (Signed)
[facility]  Patient:    [name], [name]                         MRN: [id number] Adm. Date:  [id number] Attending:  Pleas Koch Dictator:   Mack Guise, C.N.M.                         History and Physical  HISTORY OF PRESENT ILLNESS:       [name] is a 40 year old, gravida 2, para 0-0-1-0 at term, EDD October 30, 1999, by 19-week ultrasound, who presents with spontaneous rupture of membranes at home on Saturday, October 30, 1999, at 0900 for clear fluid.  She has been leaking small amounts of clear fluid since that time, has been having mild and irregular contractions.  The patient states that she did not know that she should call, but friends at church today instructed her that her water might have broken and that she needed to call.  The patient was instructed to report to the maternity admissions unit at [facility] [place] for evaluation.  She presented at 1525.  She reports positive fetal movement, no bleeding.  She denies any headache, visual changes, or epigastric pain.  Her pregnancy has been followed by the C.N.M. service at [facility] and is remarkable for (1) History of depression.  The patient was taking Effexor prior to pregnancy and stopped with positive urine pregnancy test.  (2) Family history of congenital anomaly.  The patients half-sisters baby has an emphalocele. (3) Rubella nonimmune.  (4) Chronic constipation.  (5) Group B Strep negative. (6) Premature rupture of membranes x 31 hours.  PRENATAL LABORATORY DATA:         On April 15, 1999, hemoglobin and hematocrit  11.7 and 35.9, platelets 282,000.  Blood type and Rh O positive.  Antibody screen negative.  VDRL nonreactive.  Toxo titer is negative/negative.  Rubella not immune.  Hepatitis B surface antigen negative.  HIV nonreactive.  GC and chlamydia negative.  On May 24, 1999, MSAFP testing finds results within normal range. On August 12, 1999, at 28 weeks, one-hour glucose challenge 74, hemoglobin 11.4. Culture of the vaginal tract at 36 weeks negative for group B strep.  HISTORY OF PRESENT PREGNANCY:     This patient was initially evaluated at the office of [facility] on March 11, 1999, at approximately 5 weeks.  Her pregnancy has been essentially uncomplicated.  EDC was determined by 19-week ultrasound. Throughout the patients prenatal course, the growth of uterine fundus has been noted to be  compatible in size with her dates.  Systolic blood pressure has ranged from 100 to 120.  Diastolic blood pressure has ranged from 60 to 80.  There has been no proteinuria.  OBSTETRICAL HISTORY:              1989, induced AB.  PAST MEDICAL HISTORY:             The patient has a history of a heart murmur. She does not receive any prophylactic antibiotics.  She has a history of chronic constipation, history of depression.  In 1999, the patient had a ruptured disk.  FAMILY HISTORY:                   The patients half sister has a history of anemia, maternal grandmother with a history of diabetes, maternal grandmother with history of hypothyroidism, mother with history  of hyperthyroidism, second cousin with epilepsy, maternal grandmother and maternal grandfather with history of alcohol abuse.  MEDICATIONS:                      Prenatal vitamins.  ALLERGIES:                        No known drug allergies.  HABITS:                           The patient denies the use to tobacco, alcohol, or illicit drugs throughout her pregnancy.  She quit smoking one year ago, quit  using alcohol one and one-half years ago, and quit using illicit drugs two years ago.  GENETIC HISTORY:                  The patients half-sisters baby with emphalocele, and father of the babys nephew, abdominal surgery at birth.  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  Her husband, Ellianna Ruest, is African-American.  He is involved and supportive.  He works as a Consulting civil engineer and as a Financial risk analyst.  They are of the [employer] faith.  PHYSICAL EXAMINATION:  VITAL SIGNS:                      Afebrile.  Blood pressures have been 141/95, 140/84, 140/96.  HEENT:                            Unremarkable.  LUNGS:                            Clear.  HEART:                            Regular rate and rhythm.  ABDOMEN:                          Gravid in its contour.  Uterine fundus is noted to extend 42 cm above the level of the pubic symphysis.  Leopold maneuver finds the infant in longitudinal lie at presentation, and the estimated fetal weight is 8 to 8-1/2 pounds.  The baseline of the fetal heart rate monitor is 130s with average long-term variability.  Reactivity is present with no periodic changes.  The patient is contracting mildly and irregularly.  PELVIC:

## 2011-01-21 NOTE — H&P (Signed)
NAMEJUVIA, [name]                ACCOUNT NO.:  [id number]   MEDICAL RECORD NO.:  [id number]          PATIENT TYPE:  INP   LOCATION:  9165                          [facility]ILITY:  [facility]   PHYSICIAN:  Naima A. Dillard, M.D. DATE OF BIRTH:  1971/02/04   DATE OF ADMISSION:  03/26/2005  DATE OF DISCHARGE:                                HISTORY & PHYSICAL   HISTORY OF PRESENT ILLNESS:  [name] is a 40 year old married white  female, gravida 3, para 1-0-1-1, at 40-3/7 weeks who presents with leaking  fluid since 8:45 p.m. with mild contractions since then.  She denies  bleeding or signs and symptoms of PIH.  She reports positive fetal movement.  Her pregnancy has been followed by the [facility] certified nurse  midwife service and has been remarkable for:  (1) Anxiety/depression.  (2)  Back surgery x2.  (3) Group B Strep negative.   PRENATAL LABORATORY DATA:  Collected on August 26, 2004; hemoglobin 12.7,  hematocrit 38.8, platelets 279,000.  Blood type O positive, antibody  negative, RPR nonreactive, rubella immune, hepatitis B surface antigen  negative, HIV nonreactive.  Pap smear from July of 2005 was within normal  limits.  Gonorrhea negative, Chlamydia negative, cystic fibrosis negative.  1-hour Glucola from December 20, 2004, was 122.  Fetal fibronectin from that  same date was negative.  Gonorrhea and Chlamydia from that same date is  negative.  HSV-2 from that same date is negative.  Culture of the vaginal  tract for Group B Strep on February 17, 2005, was negative.   HISTORY OF PRESENT PREGNANCY:  The patient presented for care at [facility] on August 11, 2004, at [redacted] weeks gestation.  The patient was  given Reglan for negative.  In 2004, she had HSV-1 glycoprotein that was  positive and HSV-2 glycoprotein that was equivocal.  She had those redone at  26 weeks and the HSV-2 glycoprotein was negative.  Her quad screen was  within normal limits at 15 weeks.  Ultrasound  at [redacted] weeks gestation was a  poor anatomy scan that was redone at 21 weeks and the anatomy scan was  complete and confirmed EDC of March 23, 2005.  She had some menstrual cramps  at 26 weeks and had a negative fetal fibronectin.  Her cervix was long and  closed.  Her last month she had increasing discomforts of pregnancy, but  other than that her pregnancy has been unremarkable.   OB HISTORY:  She is a gravida 3, para 1-0-1-1.  In 1987 at questionable 5  months, she had a TAB.  In 2001, she had vaginal delivery of a female infant  weighing 7 pounds 12 ounces at [redacted] weeks gestation after 15 hours in labor.  She had an epidural for anesthesia.  She reports heavy bleeding post  delivery.   ALLERGIES:  No known drug allergies.   PAST MEDICAL HISTORY:  She experienced menarche at the age of 60 with 30-day  cycles lasting 7 days.  She reports having had the usual childhood diseases.  She was treated for  Chlamydia at the age of 35.   PAST SURGICAL HISTORY:  Remarkable for ruptured disk repair in 1999 and in  2002.  In 2004, she had outpatient eye surgery.   FAMILY HISTORY:  Remarkable for a mother with hyperthyroidism, maternal  grandmother with hypothyroidism.   GENETIC HISTORY:  Remarkable for half sister's child born with  gastroschisis.   SOCIAL HISTORY:  The patient is married to the father of the baby.  His name  is Francee Piccolo.  He is involved and supportive.  They are of the [place] faith.  They both have 2 years of college education.  The patient is employed full  time as a Medical sales representative.  The father of the baby is a full time Consulting civil engineer.  They deny any alcohol, tobacco, or illicit drug use with the pregnancy,  although, the patient did take Effexor prior to becoming pregnant for her  anxiety.   OBJECTIVE:  VITAL SIGNS:  Stable, she is afebrile.  HEENT:  Grossly within normal limits.  CHEST:  Clear to auscultation.  HEART:  Regular rate and rhythm.  ABDOMEN:  Gravid in contour with  the fundal height extending approximately  39 cm above the pubic symphysis.  Fetal heart rate is baseline 130's to  140's with the average longterm variability, occasional accelerations, no  decelerations, contractions every 5 minutes.  PELVIC:  The cervix is 2 cm, 70%, vertex at -2, with thin green fluid noted.  EXTREMITIES:  Normal.   ASSESSMENT:  1.  Intrauterine pregnancy at term.  2.  Spontaneous rupture of membranes.  3.  Early labor.  4.  Group B Strep negative.   PLAN:  Admit to the birthing suite.  Dr. Normand Sloop will be notified.  Routine  C.N.M. orders.  The patient plans to ambulate with a reactive strip to  encourage labor.  She will use Stadol when uncomfortable.      Cam Hai, C.N.M.      Naima A. Normand Sloop, M.D.  Electronically Signed    KS/MEDQ  D:  03/26/2005  T:  03/26/2005  Job:  [id number]

## 2011-01-21 NOTE — Discharge Summary (Signed)
NAMECINDA, HARA                ACCOUNT NO.:  [id number]   MEDICAL RECORD NO.:  [id number]          PATIENT TYPE:  INP   LOCATION:  9121                          FACILITY:  [facility]   PHYSICIAN:  Osborn Coho, M.D.   DATE OF BIRTH:  02-04-1971   DATE OF ADMISSION:  03/26/2005  DATE OF DISCHARGE:  03/29/2005                                 DISCHARGE SUMMARY   ADMITTING DIAGNOSES:  1.  Intrauterine pregnancy at term, delivered.  2.  Desires sterilization.   PROCEDURE:  1.  Normal spontaneous vaginal delivery.  2.  Postpartum bilateral tubal sterilization.   DISCHARGE DIAGNOSES:  1.  Intrauterine pregnancy at term, delivered.  2.  Postpartum bilateral tubal ligation.   Ms. Ratledge is a 40 year old gravida 3, para 1-0-1-1 who presents at 40-3/7  weeks with spontaneous rupture of membranes in early labor.  Her labor was  augmented with Pitocin and progressed normally.  She became complete and  pushed well to a normal spontaneous vaginal delivery with the birth of an 8  pound 4 ounce female infant named Lona with Apgar scores of 9 at one minute,  9 at five minutes.  Patient has done well in the immediate postpartum  period.  Her vital signs have remained stable.  She is afebrile.  On her  first postpartum day her hemoglobin was 9.6 and she underwent postpartum  bilateral tubal sterilization with Dr. Dois Davenport Rivard and has done well  following surgery and on this, her second postpartum day and first  postoperative day she is judged to be in satisfactory condition for  discharge.   DISCHARGE INSTRUCTIONS:  Per [facility] handout.   DISCHARGE MEDICATIONS:  1.  Motrin 600 mg p.o. q.6h. p.r.n. pain.  2.  Tylox one to two p.o. q.3-4h. pain.  3.  Prenatal vitamins.   Patient will follow up at the office of [facility] in six weeks.       SDM/MEDQ  D:  03/29/2005  T:  03/29/2005  Job:  [id number]

## 2011-01-21 NOTE — Op Note (Signed)
[name], [name]                            ACCOUNT NO.:  [id number]   MEDICAL RECORD NO.:  [id number]                   PATIENT TYPE:  OIB   LOCATION:  2867                                 FACILITY:  [facility]   PHYSICIAN:  Salley Scarlet., M.D.         DATE OF BIRTH:  01-23-1971   DATE OF PROCEDURE:  01/20/2003  DATE OF DISCHARGE:  01/17/2003                                 OPERATIVE REPORT   PREOPERATIVE DIAGNOSIS:  Pterygium, left eye.   POSTOPERATIVE DIAGNOSIS:  Pterygium, left eye.   OPERATION PERFORMED:  Pterygium excision.   SURGEON:  Nadyne Coombes, M.D.   ANESTHESIA:  Local using Xylocaine 2%, Marcaine 0.75%, [place].   INDICATIONS FOR PROCEDURE:  The patient is a 40 year old female who  complains of having a red knot on the white of her eyeball.  She was  evaluated and found to have an inflamed pterygium which was encroaching onto  approximately 3 mm of the cornea of her left eye.  The patient was initially  treated with topical steroids.  There was not a significant improvement in  the appearance of the lesion.  The patient was advised that she has a  pterygium and that in order to treat it, it would need to be excised.  The  patient in fact, requested that the pterygium be excised.  She is admitted  at this time for that purposed.   DESCRIPTION OF PROCEDURE:  Under the influence of IV sedation, the Van Lint  akinesia and retrobulbar anesthesia was given.  The patient was prepped and  draped in the usual manner.  A lid speculum was inserted under the upper and  lower lid of the left eye.  A curvilinear incision was made in the cornea  anterior to the insertion of the head of the pterygium.  The pterygium was  then excised in toto using sharp and blunt dissection.  After the sclera had  been exposed, the lesion was then excised and submitted to pathology for  study.  Hemostasis was achieved by using cautery.  The area immediately  adjacent to the limbus was  scraped with the blade and further cauterized  using the cautery.  The conjunctiva was then reapproximated leaving a bare  area immediately adjacent to the limbus.  1mL of Celestone and 0.36mL of  gentamicin were injected subconjunctivally.  Maxitrol ophthalmic was applied  to the eye.  A patch was applied.  The patient tolerated the procedure well  and was discharged to the post anesthesia care unit in satisfactory  condition.  She was instructed to rest today, to take Vicodin every four  hours as needed for pain and to see me in the office tomorrow for further  evaluation.   DISCHARGE DIAGNOSIS:  Pterygium, left eye.  Salley Scarlet., M.D.    TB/MEDQ  D:  01/20/2003  T:  01/21/2003  Job:  [id number]

## 2011-06-17 LAB — CBC
HCT: 37.4
Hemoglobin: 12.6
MCHC: 33.8
MCV: 80.8
Platelets: 294
RBC: 4.63
RDW: 14.3 — ABNORMAL HIGH
WBC: 7.4

## 2011-06-17 LAB — BASIC METABOLIC PANEL
BUN: 5 — ABNORMAL LOW
CO2: 26
Calcium: 9.3
Chloride: 103
Creatinine, Ser: 0.64
GFR calc Af Amer: >60
GFR calc non Af Amer: >60
Glucose, Bld: 88
Potassium: 4
Sodium: 135

## 2011-06-17 LAB — DIFFERENTIAL
Basophils Absolute: 0
Basophils Relative: 1
Eosinophils Absolute: 0.3
Eosinophils Relative: 4
Lymphocytes Relative: 44
Lymphs Abs: 3.3
Monocytes Absolute: 0.6
Monocytes Relative: 8
Neutro Abs: 3.3
Neutrophils Relative %: 44

## 2011-06-17 LAB — TYPE AND SCREEN
ABO/RH(D): O POS
Antibody Screen: NEGATIVE

## 2011-06-17 LAB — PROTIME-INR
INR: 1
Prothrombin Time: 13.1

## 2011-06-17 LAB — ABO/RH: ABO/RH(D): O POS

## 2011-06-17 LAB — APTT: aPTT: 31

## 2011-06-22 LAB — DIFFERENTIAL
Basophils Absolute: 0
Basophils Relative: 0
Eosinophils Absolute: 0.7
Eosinophils Relative: 6 — ABNORMAL HIGH
Lymphocytes Relative: 27
Lymphs Abs: 3.2
Monocytes Absolute: 1 — ABNORMAL HIGH
Monocytes Relative: 8
Neutro Abs: 7.1
Neutrophils Relative %: 59

## 2011-06-22 LAB — ANAEROBIC CULTURE

## 2011-06-22 LAB — CBC
HCT: 33.8 — ABNORMAL LOW
Hemoglobin: 11.5 — ABNORMAL LOW
MCHC: 33.9
MCV: 83.3
Platelets: 378
RBC: 4.06
RDW: 13.8
WBC: 12 — ABNORMAL HIGH

## 2011-06-22 LAB — WOUND CULTURE

## 2011-06-23 ENCOUNTER — Emergency Department (HOSPITAL_COMMUNITY)
Admission: EM | Admit: 2011-06-23 | Discharge: 2011-06-24 | Disposition: A | Discharge status: Home / Self Care | Payer: Medicare Other | Attending: Emergency Medicine | Admitting: Emergency Medicine

## 2011-06-23 DIAGNOSIS — M79609 Pain in unspecified limb: Secondary | ICD-10-CM | POA: Insufficient documentation

## 2011-06-23 DIAGNOSIS — M25559 Pain in unspecified hip: Secondary | ICD-10-CM | POA: Insufficient documentation

## 2011-06-23 DIAGNOSIS — W19XXXA Unspecified fall, initial encounter: Secondary | ICD-10-CM | POA: Insufficient documentation

## 2011-06-23 DIAGNOSIS — M545 Low back pain, unspecified: Secondary | ICD-10-CM | POA: Insufficient documentation

## 2011-06-23 DIAGNOSIS — K219 Gastro-esophageal reflux disease without esophagitis: Secondary | ICD-10-CM | POA: Insufficient documentation

## 2011-06-23 DIAGNOSIS — N760 Acute vaginitis: Secondary | ICD-10-CM | POA: Insufficient documentation

## 2011-06-23 DIAGNOSIS — A499 Bacterial infection, unspecified: Secondary | ICD-10-CM | POA: Insufficient documentation

## 2011-06-23 DIAGNOSIS — R110 Nausea: Secondary | ICD-10-CM | POA: Insufficient documentation

## 2011-06-23 DIAGNOSIS — R1033 Periumbilical pain: Secondary | ICD-10-CM | POA: Insufficient documentation

## 2011-06-23 DIAGNOSIS — B9689 Other specified bacterial agents as the cause of diseases classified elsewhere: Secondary | ICD-10-CM | POA: Insufficient documentation

## 2011-06-23 DIAGNOSIS — N898 Other specified noninflammatory disorders of vagina: Secondary | ICD-10-CM | POA: Insufficient documentation

## 2011-06-23 LAB — URINALYSIS, ROUTINE W REFLEX MICROSCOPIC
Bilirubin Urine: NEGATIVE
Glucose, UA: NEGATIVE mg/dL
Ketones, ur: NEGATIVE mg/dL
Leukocytes, UA: NEGATIVE
Nitrite: NEGATIVE
Protein, ur: NEGATIVE mg/dL
Specific Gravity, Urine: 1.011 (ref 1.005–1.030)
Urobilinogen, UA: 0.2 mg/dL (ref 0.0–1.0)
pH: 6 (ref 5.0–8.0)

## 2011-06-23 LAB — POCT I-STAT, CHEM 8
BUN: 14 mg/dL (ref 6–23)
Calcium, Ion: 1.29 mmol/L (ref 1.12–1.32)
Chloride: 101 mEq/L (ref 96–112)
Creatinine, Ser: 0.8 mg/dL (ref 0.50–1.10)
Glucose, Bld: 67 mg/dL — ABNORMAL LOW (ref 70–99)
HCT: 41 % (ref 36.0–46.0)
Hemoglobin: 13.9 g/dL (ref 12.0–15.0)
Potassium: 3.9 mEq/L (ref 3.5–5.1)
Sodium: 139 mEq/L (ref 135–145)
TCO2: 28 mmol/L (ref 0–100)

## 2011-06-23 LAB — DIFFERENTIAL
Basophils Absolute: 0 10*3/uL (ref 0.0–0.1)
Basophils Relative: 0 % (ref 0–1)
Eosinophils Absolute: 0.2 10*3/uL (ref 0.0–0.7)
Eosinophils Relative: 2 % (ref 0–5)
Lymphocytes Relative: 35 % (ref 12–46)
Lymphs Abs: 4 10*3/uL (ref 0.7–4.0)
Monocytes Absolute: 0.7 10*3/uL (ref 0.1–1.0)
Monocytes Relative: 6 % (ref 3–12)
Neutro Abs: 6.4 10*3/uL (ref 1.7–7.7)
Neutrophils Relative %: 56 % (ref 43–77)

## 2011-06-23 LAB — CBC
HCT: 40 % (ref 36.0–46.0)
Hemoglobin: 13.3 g/dL (ref 12.0–15.0)
MCH: 28.1 pg (ref 26.0–34.0)
MCHC: 33.3 g/dL (ref 30.0–36.0)
MCV: 84.6 fL (ref 78.0–100.0)
Platelets: 259 10*3/uL (ref 150–400)
RBC: 4.73 MIL/uL (ref 3.87–5.11)
RDW: 14.8 % (ref 11.5–15.5)
WBC: 11.4 10*3/uL — ABNORMAL HIGH (ref 4.0–10.5)

## 2011-06-23 LAB — URINE MICROSCOPIC-ADD ON

## 2011-06-24 LAB — WET PREP, GENITAL
Trich, Wet Prep: NONE SEEN
WBC, Wet Prep HPF POC: NONE SEEN
Yeast Wet Prep HPF POC: NONE SEEN

## 2011-06-25 LAB — GC/CHLAMYDIA PROBE AMP, GENITAL
Chlamydia, DNA Probe: NEGATIVE
GC Probe Amp, Genital: NEGATIVE

## 2011-06-29 ENCOUNTER — Emergency Department (INDEPENDENT_AMBULATORY_CARE_PROVIDER_SITE_OTHER): Payer: Medicare Other

## 2011-06-29 ENCOUNTER — Emergency Department (HOSPITAL_COMMUNITY): Payer: Medicare Other

## 2011-06-29 ENCOUNTER — Emergency Department (HOSPITAL_BASED_OUTPATIENT_CLINIC_OR_DEPARTMENT_OTHER)
Admission: EM | Admit: 2011-06-29 | Discharge: 2011-06-29 | Disposition: A | Discharge status: Home / Self Care | Payer: Medicare Other | Attending: Emergency Medicine | Admitting: Emergency Medicine

## 2011-06-29 DIAGNOSIS — N83201 Unspecified ovarian cyst, right side: Secondary | ICD-10-CM

## 2011-06-29 DIAGNOSIS — R109 Unspecified abdominal pain: Secondary | ICD-10-CM

## 2011-06-29 DIAGNOSIS — N83209 Unspecified ovarian cyst, unspecified side: Secondary | ICD-10-CM

## 2011-06-29 DIAGNOSIS — F329 Major depressive disorder, single episode, unspecified: Secondary | ICD-10-CM | POA: Insufficient documentation

## 2011-06-29 DIAGNOSIS — N898 Other specified noninflammatory disorders of vagina: Secondary | ICD-10-CM | POA: Insufficient documentation

## 2011-06-29 DIAGNOSIS — E079 Disorder of thyroid, unspecified: Secondary | ICD-10-CM | POA: Insufficient documentation

## 2011-06-29 DIAGNOSIS — N939 Abnormal uterine and vaginal bleeding, unspecified: Secondary | ICD-10-CM

## 2011-06-29 DIAGNOSIS — F3289 Other specified depressive episodes: Secondary | ICD-10-CM | POA: Insufficient documentation

## 2011-06-29 HISTORY — DX: Rosacea, unspecified: L71.9

## 2011-06-29 HISTORY — DX: Disorder of thyroid, unspecified: E07.9

## 2011-06-29 HISTORY — DX: Major depressive disorder, single episode, unspecified: F32.9

## 2011-06-29 HISTORY — DX: Depression, unspecified: F32.A

## 2011-06-29 LAB — DIFFERENTIAL
Basophils Absolute: 0 10*3/uL (ref 0.0–0.1)
Basophils Relative: 0 % (ref 0–1)
Eosinophils Absolute: 0.2 10*3/uL (ref 0.0–0.7)
Eosinophils Relative: 2 % (ref 0–5)
Lymphocytes Relative: 33 % (ref 12–46)
Lymphs Abs: 3.3 10*3/uL (ref 0.7–4.0)
Monocytes Absolute: 0.8 10*3/uL (ref 0.1–1.0)
Monocytes Relative: 8 % (ref 3–12)
Neutro Abs: 5.8 10*3/uL (ref 1.7–7.7)
Neutrophils Relative %: 57 % (ref 43–77)

## 2011-06-29 LAB — URINALYSIS, ROUTINE W REFLEX MICROSCOPIC
Bilirubin Urine: NEGATIVE
Glucose, UA: NEGATIVE mg/dL
Hgb urine dipstick: NEGATIVE
Ketones, ur: NEGATIVE mg/dL
Leukocytes, UA: NEGATIVE
Nitrite: NEGATIVE
Protein, ur: NEGATIVE mg/dL
Specific Gravity, Urine: 1.006 (ref 1.005–1.030)
Urobilinogen, UA: 0.2 mg/dL (ref 0.0–1.0)
pH: 7 (ref 5.0–8.0)

## 2011-06-29 LAB — WET PREP, GENITAL
Trich, Wet Prep: NONE SEEN
Yeast Wet Prep HPF POC: NONE SEEN

## 2011-06-29 LAB — CBC
HCT: 39.6 % (ref 36.0–46.0)
Hemoglobin: 13.2 g/dL (ref 12.0–15.0)
MCH: 27.4 pg (ref 26.0–34.0)
MCHC: 33.3 g/dL (ref 30.0–36.0)
MCV: 82.3 fL (ref 78.0–100.0)
Platelets: 231 10*3/uL (ref 150–400)
RBC: 4.81 MIL/uL (ref 3.87–5.11)
RDW: 15 % (ref 11.5–15.5)
WBC: 10.1 10*3/uL (ref 4.0–10.5)

## 2011-06-29 LAB — COMPREHENSIVE METABOLIC PANEL
ALT: 36 U/L — ABNORMAL HIGH (ref 0–35)
AST: 16 U/L (ref 0–37)
Albumin: 3.9 g/dL (ref 3.5–5.2)
Alkaline Phosphatase: 63 U/L (ref 39–117)
BUN: 9 mg/dL (ref 6–23)
CO2: 26 mEq/L (ref 19–32)
Calcium: 9.3 mg/dL (ref 8.4–10.5)
Chloride: 103 mEq/L (ref 96–112)
Creatinine, Ser: 0.6 mg/dL (ref 0.50–1.10)
GFR calc Af Amer: >90 mL/min (ref >90)
GFR calc non Af Amer: >90 mL/min (ref >90)
Glucose, Bld: 101 mg/dL — ABNORMAL HIGH (ref 70–99)
Potassium: 3.9 mEq/L (ref 3.5–5.1)
Sodium: 139 mEq/L (ref 135–145)
Total Bilirubin: 0.3 mg/dL (ref 0.3–1.2)
Total Protein: 6.9 g/dL (ref 6.0–8.3)

## 2011-06-29 LAB — PREGNANCY, URINE: Preg Test, Ur: NEGATIVE

## 2011-06-29 LAB — LIPASE, BLOOD: Lipase: 15 U/L (ref 11–59)

## 2011-06-29 LAB — OCCULT BLOOD X 1 CARD TO LAB, STOOL: Fecal Occult Bld: POSITIVE

## 2011-06-29 MED ORDER — MORPHINE SULFATE 4 MG/ML IJ SOLN
4.0000 mg | Freq: Once | INTRAMUSCULAR | Status: AC
  Administered 2011-06-29: 4 mg via INTRAVENOUS
  Filled 2011-06-29: qty 1

## 2011-06-29 MED ORDER — IBUPROFEN 600 MG PO TABS: 600.0000 mg | ORAL_TABLET | Freq: Four times a day (QID) | ORAL | Status: AC | PRN

## 2011-06-29 MED ORDER — ONDANSETRON HCL 4 MG/2ML IJ SOLN
4.0000 mg | Freq: Once | INTRAMUSCULAR | Status: AC
  Administered 2011-06-29: 4 mg via INTRAVENOUS
  Filled 2011-06-29: qty 2

## 2011-06-29 MED ORDER — SODIUM CHLORIDE 0.9 % IV BOLUS (SEPSIS)
1000.0000 mL | Freq: Once | INTRAVENOUS | Status: AC
  Administered 2011-06-29: 1000 mL via INTRAVENOUS

## 2011-06-29 MED ORDER — HYDROMORPHONE HCL 1 MG/ML IJ SOLN
1.0000 mg | Freq: Once | INTRAMUSCULAR | Status: AC
  Administered 2011-06-29: 1 mg via INTRAVENOUS
  Filled 2011-06-29: qty 1

## 2011-06-29 NOTE — ED Notes (Signed)
Pt requesting cath for I&O cath while she is in ultrasound.  Pt provided with cath and assisted to BR.

## 2011-06-29 NOTE — ED Notes (Signed)
Pt transported to ultrasound.

## 2011-06-29 NOTE — ED Notes (Signed)
Tenny Craw, PA-C at bedside

## 2011-06-29 NOTE — ED Notes (Signed)
Pt reports onset of vaginal bleeding 10 days ago and now reports abdominal pain.  She has been seen several times for similar symptoms.  CT scan and labs performed.

## 2011-06-29 NOTE — ED Notes (Signed)
Pt ambulatory to BR

## 2011-06-29 NOTE — ED Provider Notes (Signed)
History     CSN: [id number] Arrival date & time: 06/29/2011 10:40 AM   First MD Initiated Contact with Patient 06/29/11 1049      Chief Complaint  Patient presents with  . Vaginal Bleeding  . Abdominal Pain    (Consider location/radiation/quality/duration/timing/severity/associated sxs/prior treatment) Patient is a 40 y.o. female presenting with vaginal bleeding and abdominal pain. The history is provided by the patient.  Vaginal Bleeding Episode onset: 1.5 weeks ago. The problem occurs constantly. The problem has been unchanged. Associated symptoms include abdominal pain, a fever and nausea. Pertinent negatives include no vomiting. The symptoms are aggravated by nothing.  Abdominal Pain The primary symptoms of the illness include abdominal pain, fever, nausea and vaginal bleeding. The primary symptoms of the illness do not include vomiting or diarrhea. Episode onset: 1.5 weeks ago   The abdominal pain is generalized.  The fever began 3 to 5 days ago. The maximum temperature recorded prior to her arrival was 100 to 100.9 F. The temperature was taken by a tympanic thermometer.   The quantity of blood was heavier than menses (Pt states 1 pad per hour. ).  The patient states that she believes she is currently not pregnant. The patient has not had a change in bowel habit. Symptoms associated with the illness do not include urgency, hematuria or frequency.  Vaginal Bleeding Episode onset: 1.5 weeks ago. The problem occurs constantly. The problem has been unchanged. Associated symptoms include abdominal pain. The symptoms are aggravated by nothing.   Pt seen by her PCP 2 days ago and had a CT scan yesterday (does not know the results).  Past Medical History  Diagnosis Date  . Depression   . Thyroid disease   . Rosacea     Past Surgical History  Procedure Date  . Back surgery   . Eye surgery   . Incontinence surgery     No family history on file.  History  Substance Use Topics   . Smoking status: Current Everyday Smoker -- 0.5 packs/day  . Smokeless tobacco: Never Used  . Alcohol Use: No    OB History    Grav Para Term Preterm Abortions TAB SAB Ect Mult Living                  Review of Systems  Constitutional: Positive for fever.  Gastrointestinal: Positive for nausea and abdominal pain. Negative for vomiting and diarrhea.  Genitourinary: Positive for vaginal bleeding. Negative for urgency, frequency and hematuria.  All other systems reviewed and are negative.    Allergies  Celebrex; Lyrica; Prozac; and Wellbutrin  Home Medications   Current Outpatient Rx  Name Route Sig Dispense Refill  . ALPRAZOLAM 1 MG PO TABS Oral Take 1 mg by mouth 3 (three) times daily.      Marland Kitchen CITALOPRAM HYDROBROMIDE 40 MG PO TABS Oral Take 40 mg by mouth daily.      Marland Kitchen DOXYCYCLINE HYCLATE 100 MG PO CAPS Oral Take 100 mg by mouth 2 (two) times daily.      Marland Kitchen LEVOTHYROXINE SODIUM 50 MCG PO TABS Oral Take 50 mcg by mouth daily.      Marland Kitchen METHOCARBAMOL 750 MG PO TABS Oral Take 750 mg by mouth 4 (four) times daily.      . MORPHINE SULFATE 30 MG PO TABS Oral Take 30 mg by mouth 4 (four) times daily.      . OXYCODONE HCL 40 MG PO TB12 Oral Take 40 mg by mouth 6 (six) times daily.      Marland Kitchen  OXYCODONE-ACETAMINOPHEN 7.5-325 MG PO TABS Oral Take 1 tablet by mouth every 4 (four) hours as needed.      Marland Kitchen PRAMIPEXOLE DIHYDROCHLORIDE 0.25 MG PO TABS Oral Take 0.25 mg by mouth Nightly.      . SULFAMETHOXAZOLE-TMP DS 800-160 MG PO TABS Oral Take 1 tablet by mouth 2 (two) times daily.        BP 116/67  Pulse 72  Temp(Src) 98.8 F (37.1 C) (Oral)  Resp 16  Ht 5\' 4"  (1.626 m)  Wt 200 lb (90.719 kg)  BMI 34.33 kg/m2  SpO2 99%  LMP 06/29/2011  Physical Exam  Constitutional: She appears well-developed and well-nourished.  HENT:  Head: Normocephalic and atraumatic.  Eyes: Pupils are equal, round, and reactive to light.  Cardiovascular: Normal rate, regular rhythm, normal heart sounds and  intact distal pulses.   Pulmonary/Chest: Effort normal and breath sounds normal.  Abdominal: Soft. Bowel sounds are normal. She exhibits no distension and no mass. There is tenderness in the suprapubic area. There is no rebound, no guarding and no tenderness at McBurney's point.  Genitourinary: Uterus normal. Rectal exam shows external hemorrhoid. Rectal exam shows no mass and no tenderness. Guaiac positive stool. No breast swelling or discharge. Cervix exhibits no motion tenderness. There is tenderness and bleeding around the vagina. No vaginal discharge found.       No CVA tenderness.  Mild dark blood, mild bilateral bimanuel tenderness.   Mild gross blood on rectal exam, possibly from vaginal bleeding.  Stool color normal.   Musculoskeletal: Normal range of motion.  Skin: Skin is warm and dry.    ED Course  Procedures (including critical care time)  Reviewed CT scan from 06/28/11, functional 2.8 cm R ovarian cyst, normal appearing appendix.  Will obtain US pelvis.    Labs Reviewed  CBC  DIFFERENTIAL  COMPREHENSIVE METABOLIC PANEL  LIPASE, BLOOD  URINALYSIS, ROUTINE W REFLEX MICROSCOPIC  URINE CULTURE  PREGNANCY, URINE  WET PREP, GENITAL  GC/CHLAMYDIA PROBE AMP, GENITAL   No results found.   No diagnosis found. Morphine 4 mg IVP, zofran 4 mg IVP.  Dilaudid 1 mg IVP.   MDM  CT from yesterday reviewed.  No fever, elevated WBC.  [place] confirms cyst.  Do not believe repeat CT is warranted.  Pt to follow up closely with her PCP.    Medical screening examination/treatment/procedure(s) were performed by non-physician practitioner and as supervising physician I was immediately available for consultation/collaboration. Osvaldo Human, M.D.      [place], [place] 06/29/11 [address] [place], [place] 06/29/11 1336  Carleene Cooper III, MD 06/29/11 [phone]

## 2011-06-30 LAB — URINE CULTURE
Colony Count: NO GROWTH
Culture  Setup Time: 201210241507
Culture: NO GROWTH

## 2011-06-30 LAB — GC/CHLAMYDIA PROBE AMP, GENITAL
Chlamydia, DNA Probe: NEGATIVE
GC Probe Amp, Genital: NEGATIVE

## 2011-09-21 ENCOUNTER — Emergency Department (HOSPITAL_BASED_OUTPATIENT_CLINIC_OR_DEPARTMENT_OTHER)
Admission: EM | Admit: 2011-09-21 | Discharge: 2011-09-21 | Disposition: A | Discharge status: Home / Self Care | Payer: Medicare Other | Attending: Emergency Medicine | Admitting: Emergency Medicine

## 2011-09-21 ENCOUNTER — Encounter (HOSPITAL_BASED_OUTPATIENT_CLINIC_OR_DEPARTMENT_OTHER): Payer: Self-pay | Admitting: *Deleted

## 2011-09-21 ENCOUNTER — Emergency Department (INDEPENDENT_AMBULATORY_CARE_PROVIDER_SITE_OTHER): Payer: Medicare Other

## 2011-09-21 ENCOUNTER — Other Ambulatory Visit: Payer: Self-pay

## 2011-09-21 DIAGNOSIS — F17200 Nicotine dependence, unspecified, uncomplicated: Secondary | ICD-10-CM | POA: Insufficient documentation

## 2011-09-21 DIAGNOSIS — Z79899 Other long term (current) drug therapy: Secondary | ICD-10-CM | POA: Insufficient documentation

## 2011-09-21 DIAGNOSIS — F329 Major depressive disorder, single episode, unspecified: Secondary | ICD-10-CM | POA: Insufficient documentation

## 2011-09-21 DIAGNOSIS — R079 Chest pain, unspecified: Secondary | ICD-10-CM

## 2011-09-21 DIAGNOSIS — F3289 Other specified depressive episodes: Secondary | ICD-10-CM | POA: Insufficient documentation

## 2011-09-21 DIAGNOSIS — E079 Disorder of thyroid, unspecified: Secondary | ICD-10-CM | POA: Insufficient documentation

## 2011-09-21 LAB — BASIC METABOLIC PANEL
BUN: 10 mg/dL (ref 6–23)
CO2: 27 mEq/L (ref 19–32)
Calcium: 9 mg/dL (ref 8.4–10.5)
Chloride: 100 mEq/L (ref 96–112)
Creatinine, Ser: 1 mg/dL (ref 0.50–1.10)
GFR calc Af Amer: 82 mL/min — ABNORMAL LOW (ref >90)
GFR calc non Af Amer: 71 mL/min — ABNORMAL LOW (ref >90)
Glucose, Bld: 97 mg/dL (ref 70–99)
Potassium: 3.7 mEq/L (ref 3.5–5.1)
Sodium: 136 mEq/L (ref 135–145)

## 2011-09-21 LAB — DIFFERENTIAL
Basophils Absolute: 0 10*3/uL (ref 0.0–0.1)
Basophils Relative: 0 % (ref 0–1)
Eosinophils Absolute: 0.4 10*3/uL (ref 0.0–0.7)
Eosinophils Relative: 4 % (ref 0–5)
Lymphocytes Relative: 36 % (ref 12–46)
Lymphs Abs: 3.6 10*3/uL (ref 0.7–4.0)
Monocytes Absolute: 0.8 10*3/uL (ref 0.1–1.0)
Monocytes Relative: 8 % (ref 3–12)
Neutro Abs: 5.2 10*3/uL (ref 1.7–7.7)
Neutrophils Relative %: 52 % (ref 43–77)

## 2011-09-21 LAB — CBC
HCT: 36.7 % (ref 36.0–46.0)
Hemoglobin: 12.3 g/dL (ref 12.0–15.0)
MCH: 27.5 pg (ref 26.0–34.0)
MCHC: 33.5 g/dL (ref 30.0–36.0)
MCV: 81.9 fL (ref 78.0–100.0)
Platelets: 234 10*3/uL (ref 150–400)
RBC: 4.48 MIL/uL (ref 3.87–5.11)
RDW: 14.4 % (ref 11.5–15.5)
WBC: 9.9 10*3/uL (ref 4.0–10.5)

## 2011-09-21 LAB — TROPONIN I: Troponin I: <0.3 ng/mL (ref <0.30)

## 2011-09-21 NOTE — ED Provider Notes (Signed)
History     CSN: [id number]  Arrival date & time 09/21/11  1306   First MD Initiated Contact with Patient 09/21/11 1331      Chief Complaint  Patient presents with  . Chest Pain    (Consider location/radiation/quality/duration/timing/severity/associated sxs/prior treatment) HPI Patient presents to the emergency room with complaints of chest pain that started about one hour ago. Patient states it is a sharp pain in the left side of her chest that lasts about one to 2 seconds. She's had a couple episodes of that since it started. Patient denies any shortness of breath with this specifically. She has noticed that she has had some trouble with heartburn over the last several days. She has noted some back pain in the last week but that's not necessary associated with this. She has had some fatigue as well. Patient does smoke but denies any history of heart disease. He does not have any history of blood clots. Patient is feeling better currently want to make sure there is nothing serious going on Past Medical History  Diagnosis Date  . Depression   . Thyroid disease   . Rosacea     Past Surgical History  Procedure Date  . Back surgery   . Eye surgery   . Incontinence surgery     No family history on file.  History  Substance Use Topics  . Smoking status: Current Everyday Smoker -- 0.5 packs/day  . Smokeless tobacco: Never Used  . Alcohol Use: No    OB History    Grav Para Term Preterm Abortions TAB SAB Ect Mult Living                  Review of Systems  All other systems reviewed and are negative.    Allergies  Celebrex; Lyrica; Prozac; Symbyax; and Wellbutrin  Home Medications   Current Outpatient Rx  Name Route Sig Dispense Refill  . ALPRAZOLAM 1 MG PO TABS Oral Take 1 mg by mouth 3 (three) times daily.      Marland Kitchen CITALOPRAM HYDROBROMIDE 40 MG PO TABS Oral Take 40 mg by mouth daily.      Marland Kitchen DOXYCYCLINE HYCLATE 100 MG PO CAPS Oral Take 100 mg by mouth 2 (two) times  daily.      Marland Kitchen LEVOTHYROXINE SODIUM 50 MCG PO TABS Oral Take 50 mcg by mouth daily.      Marland Kitchen METHOCARBAMOL 750 MG PO TABS Oral Take 750 mg by mouth 4 (four) times daily.      . MORPHINE SULFATE 30 MG PO TABS Oral Take 30 mg by mouth 4 (four) times daily.      . OXYCODONE HCL ER 40 MG PO TB12 Oral Take 40 mg by mouth 6 (six) times daily.      . OXYCODONE-ACETAMINOPHEN 7.5-325 MG PO TABS Oral Take 1 tablet by mouth every 4 (four) hours as needed.      Marland Kitchen PRAMIPEXOLE DIHYDROCHLORIDE 0.25 MG PO TABS Oral Take 0.25 mg by mouth Nightly.      . SULFAMETHOXAZOLE-TMP DS 800-160 MG PO TABS Oral Take 1 tablet by mouth 2 (two) times daily.        BP 115/65  Pulse 69  Temp(Src) 98.2 F (36.8 C) (Oral)  Resp 20  SpO2 98%  Physical Exam  Nursing note and vitals reviewed. Constitutional: She is oriented to person, place, and time. She appears well-developed and well-nourished. No distress.  HENT:  Head: Normocephalic and atraumatic.  Right Ear: External ear normal.  Left Ear: External ear normal.  Eyes: Conjunctivae are normal. Right eye exhibits no discharge. Left eye exhibits no discharge. No scleral icterus.  Neck: Neck supple. No tracheal deviation present.  Cardiovascular: Normal rate, regular rhythm and normal heart sounds.   Pulmonary/Chest: Effort normal. No stridor. No respiratory distress. She has no wheezes. She has no rales. She exhibits no tenderness.  Abdominal: Soft. She exhibits no distension. There is no tenderness. There is no guarding.  Musculoskeletal: She exhibits no edema and no tenderness.  Neurological: She is alert and oriented to person, place, and time. Cranial nerve deficit: no gross deficits.  Skin: Skin is warm and dry. No rash noted.  Psychiatric: She has a normal mood and affect.    ED Course  Procedures (including critical care time)  Date: 09/21/2011  Rate: 60  Rhythm: normal sinus rhythm  QRS Axis: normal  Intervals: normal  ST/T Wave abnormalities: normal   Conduction Disutrbances:none  Narrative Interpretation:   Old EKG Reviewed: No significant changes   Labs Reviewed  BASIC METABOLIC PANEL - Abnormal; Notable for the following:    GFR calc non Af Amer 71 (*)    GFR calc Af Amer 82 (*)    All other components within normal limits  CBC  DIFFERENTIAL  TROPONIN I   Dg Chest 2 View  09/21/2011  *RADIOLOGY REPORT*  Clinical Data: Sudden onset sharp left chest pain, smoker  CHEST - 2 VIEW  Comparison: 10/30/2010  Findings: Normal heart size, mediastinal contours, and pulmonary vascularity. Mild chronic peribronchial thickening. No pulmonary infiltrate, pleural effusion or pneumothorax. No acute osseous findings. Prior lumbar surgery.  IMPRESSION: Mild chronic bronchitic changes.  Original Report Authenticated By: Lollie Marrow, M.D.     No diagnosis found.    MDM  The patient's symptoms are atypical for cardiac disease. She is overall very low risk. At this point I doubt acute coronary syndrome, pulmonary embolism, aortic dissection. The patient appears safe and stable for outpatient follow up with her primary.          Celene Kras, MD 09/21/11 [phone]

## 2011-09-21 NOTE — ED Notes (Signed)
Sudden onset sharp pain in her left chest x 1 hour ago. She started doing water aerobics 2 days ago and had just left the gym today when the pain started.

## 2012-06-25 DIAGNOSIS — E039 Hypothyroidism, unspecified: Secondary | ICD-10-CM | POA: Insufficient documentation

## 2012-06-25 DIAGNOSIS — D72829 Elevated white blood cell count, unspecified: Secondary | ICD-10-CM | POA: Insufficient documentation

## 2012-06-25 DIAGNOSIS — R5383 Other fatigue: Secondary | ICD-10-CM | POA: Insufficient documentation

## 2012-06-25 DIAGNOSIS — E538 Deficiency of other specified B group vitamins: Secondary | ICD-10-CM | POA: Insufficient documentation

## 2012-06-25 DIAGNOSIS — E559 Vitamin D deficiency, unspecified: Secondary | ICD-10-CM | POA: Insufficient documentation

## 2012-06-25 DIAGNOSIS — G8929 Other chronic pain: Secondary | ICD-10-CM | POA: Insufficient documentation

## 2012-11-26 ENCOUNTER — Encounter (HOSPITAL_COMMUNITY): Payer: Self-pay | Admitting: Emergency Medicine

## 2012-11-26 ENCOUNTER — Emergency Department (HOSPITAL_COMMUNITY)
Admission: EM | Admit: 2012-11-26 | Discharge: 2012-11-26 | Disposition: A | Discharge status: Home / Self Care | Payer: Medicare Other | Attending: Emergency Medicine | Admitting: Emergency Medicine

## 2012-11-26 DIAGNOSIS — G8929 Other chronic pain: Secondary | ICD-10-CM | POA: Insufficient documentation

## 2012-11-26 DIAGNOSIS — M549 Dorsalgia, unspecified: Secondary | ICD-10-CM | POA: Insufficient documentation

## 2012-11-26 DIAGNOSIS — Z872 Personal history of diseases of the skin and subcutaneous tissue: Secondary | ICD-10-CM | POA: Insufficient documentation

## 2012-11-26 DIAGNOSIS — E079 Disorder of thyroid, unspecified: Secondary | ICD-10-CM | POA: Insufficient documentation

## 2012-11-26 DIAGNOSIS — R109 Unspecified abdominal pain: Secondary | ICD-10-CM | POA: Insufficient documentation

## 2012-11-26 DIAGNOSIS — F329 Major depressive disorder, single episode, unspecified: Secondary | ICD-10-CM | POA: Insufficient documentation

## 2012-11-26 DIAGNOSIS — Z79899 Other long term (current) drug therapy: Secondary | ICD-10-CM | POA: Insufficient documentation

## 2012-11-26 DIAGNOSIS — F17200 Nicotine dependence, unspecified, uncomplicated: Secondary | ICD-10-CM | POA: Insufficient documentation

## 2012-11-26 DIAGNOSIS — F3289 Other specified depressive episodes: Secondary | ICD-10-CM | POA: Insufficient documentation

## 2012-11-26 HISTORY — DX: Dysuria: R30.0

## 2012-11-26 HISTORY — DX: Other chronic pain: G89.29

## 2012-11-26 HISTORY — DX: Dorsalgia, unspecified: M54.9

## 2012-11-26 LAB — URINALYSIS, ROUTINE W REFLEX MICROSCOPIC
Bilirubin Urine: NEGATIVE
Glucose, UA: NEGATIVE mg/dL
Hgb urine dipstick: NEGATIVE
Ketones, ur: NEGATIVE mg/dL
Leukocytes, UA: NEGATIVE
Nitrite: NEGATIVE
Protein, ur: NEGATIVE mg/dL
Specific Gravity, Urine: 1.008 (ref 1.005–1.030)
Urobilinogen, UA: 0.2 mg/dL (ref 0.0–1.0)
pH: 6.5 (ref 5.0–8.0)

## 2012-11-26 LAB — CBC WITH DIFFERENTIAL/PLATELET
Basophils Absolute: 0 10*3/uL (ref 0.0–0.1)
Basophils Relative: 1 % (ref 0–1)
Eosinophils Absolute: 0.4 10*3/uL (ref 0.0–0.7)
Eosinophils Relative: 5 % (ref 0–5)
HCT: 36.6 % (ref 36.0–46.0)
Hemoglobin: 12.4 g/dL (ref 12.0–15.0)
Lymphocytes Relative: 38 % (ref 12–46)
Lymphs Abs: 3 10*3/uL (ref 0.7–4.0)
MCH: 28.4 pg (ref 26.0–34.0)
MCHC: 33.9 g/dL (ref 30.0–36.0)
MCV: 83.8 fL (ref 78.0–100.0)
Monocytes Absolute: 0.6 10*3/uL (ref 0.1–1.0)
Monocytes Relative: 8 % (ref 3–12)
Neutro Abs: 3.8 10*3/uL (ref 1.7–7.7)
Neutrophils Relative %: 49 % (ref 43–77)
Platelets: 208 10*3/uL (ref 150–400)
RBC: 4.37 MIL/uL (ref 3.87–5.11)
RDW: 13 % (ref 11.5–15.5)
WBC: 7.8 10*3/uL (ref 4.0–10.5)

## 2012-11-26 LAB — BASIC METABOLIC PANEL
BUN: 15 mg/dL (ref 6–23)
CO2: 28 mEq/L (ref 19–32)
Calcium: 9.5 mg/dL (ref 8.4–10.5)
Chloride: 102 mEq/L (ref 96–112)
Creatinine, Ser: 0.69 mg/dL (ref 0.50–1.10)
GFR calc Af Amer: >90 mL/min (ref >90)
GFR calc non Af Amer: >90 mL/min (ref >90)
Glucose, Bld: 95 mg/dL (ref 70–99)
Potassium: 3.8 mEq/L (ref 3.5–5.1)
Sodium: 138 mEq/L (ref 135–145)

## 2012-11-26 MED ORDER — SODIUM CHLORIDE 0.9 % IV SOLN
INTRAVENOUS | Status: DC
  Administered 2012-11-26: 125 mL/h via INTRAVENOUS

## 2012-11-26 MED ORDER — HYDROMORPHONE HCL PF 1 MG/ML IJ SOLN
1.0000 mg | INTRAMUSCULAR | Status: DC | PRN
  Administered 2012-11-26 (×2): 1 mg via INTRAVENOUS
  Filled 2012-11-26 (×2): qty 1

## 2012-11-26 NOTE — ED Notes (Signed)
Chronic back pain and is a pt of the pain clinic and that she has taken all the pain medication avaliable for it and has a pain stimulator not helping. Fever at home for 1 month. Constipation with bright red color large amount on tissue with bm. Sat was last bm.

## 2012-11-26 NOTE — ED Provider Notes (Signed)
History    CSN: [id number] Arrival date & time 11/26/12  1750 First MD Initiated Contact with Patient 11/26/12 1826     Chief Complaint  Patient presents with  . Back Pain  . Groin Pain   HPI Pt has been having back pain for the last three days.  She has history of prior back surgeries including spinal fusion.  For months she has had hip pain and groin pain.  Movement increases the pain.  She goes to a pain management center and is taking the medications as instructed.  None of it seems to be helping.  Pt talked to her doctor and is going to get epidural shots.  She called her doctor today and was told that her epidural was not set up yet.  For three months she has had groin pain.  For the last month she feels like there is only one decent day per week.  These symptoms are progressing.    Patient states she does have chronic pain. She is concerned that her symptoms are progressing and she has not been in to get any repeat imaging tests. Patient feels like she is having low-grade fevers but has not measured a temperature.  She's not having vomiting or diarrhea. Patient normally has to self catheterize herself every 3 hours. This is unchanged.  She sees Dr. Sharolyn Douglas as well as pain management. Past Medical History  Diagnosis Date  . Depression   . Thyroid disease   . Rosacea   . Chronic back pain greater than 3 months duration   . Dysuria     has to have self in and out cath     Past Surgical History  Procedure Laterality Date  . Back surgery    . Eye surgery    . Incontinence surgery    . Spinal fusion  2008  . Bladder mesh  2009    No family history on file.  History  Substance Use Topics  . Smoking status: Current Every Day Smoker -- 0.50 packs/day  . Smokeless tobacco: Never Used  . Alcohol Use: No    OB History   Grav Para Term Preterm Abortions TAB SAB Ect Mult Living                  Review of Systems  Allergies  Pregabalin; Celebrex; Effexor; Prozac;  Symbyax; and Wellbutrin  Home Medications   Current Outpatient Rx  Name  Route  Sig  Dispense  Refill  . ALPRAZolam (XANAX) 1 MG tablet   Oral   Take 1 mg by mouth 3 (three) times daily.           Marland Kitchen aspirin 325 MG tablet   Oral   Take 325 mg by mouth every 4 (four) hours as needed for pain.         . citalopram (CELEXA) 40 MG tablet   Oral   Take 40 mg by mouth daily.          Marland Kitchen levothyroxine (SYNTHROID, LEVOTHROID) 50 MCG tablet   Oral   Take 50 mcg by mouth daily.           Marland Kitchen lidocaine (LIDODERM) 5 %   Transdermal   Place 1 patch onto the skin daily. Remove & Discard patch within 12 hours or as directed by MD         . morphine (MSIR) 30 MG tablet   Oral   Take 30 mg by mouth every 6 (six) hours as  needed for pain.          Marland Kitchen sulfamethoxazole-trimethoprim (BACTRIM DS) 800-160 MG per tablet   Oral   Take 1 tablet by mouth 2 (two) times daily.             BP 122/72  Pulse 70  Temp(Src) 98.8 F (37.1 C) (Oral)  Resp 19  SpO2 95%  Physical Exam  Nursing note and vitals reviewed. Constitutional: She appears well-developed and well-nourished. No distress.  Obese  HENT:  Head: Normocephalic and atraumatic.  Right Ear: External ear normal.  Left Ear: External ear normal.  Nose: Nose normal.  Eyes: Conjunctivae and EOM are normal. Right eye exhibits no discharge. Left eye exhibits no discharge. No scleral icterus.  Neck: Neck supple. No tracheal deviation present.  Cardiovascular: Normal rate.   Pulmonary/Chest: Effort normal. No stridor. No respiratory distress.  Musculoskeletal: She exhibits no edema and no tenderness.       Lumbar back: She exhibits decreased range of motion, tenderness, pain and spasm. She exhibits no swelling and no edema.  No erythema over her surgical site  Neurological: She is alert. She is not disoriented. No sensory deficit. Cranial nerve deficit: no gross deficits. She exhibits normal muscle tone. Coordination normal.   Reflex Scores:      Patellar reflexes are 2+ on the right side and 2+ on the left side.      Achilles reflexes are 2+ on the right side and 2+ on the left side. Skin: Skin is warm and dry. No rash noted. She is not diaphoretic. No erythema.  Psychiatric: She has a normal mood and affect. Her behavior is normal. Thought content normal.    ED Course  Procedures (including critical care time)  Labs Reviewed  CBC WITH DIFFERENTIAL  BASIC METABOLIC PANEL  URINALYSIS, ROUTINE W REFLEX MICROSCOPIC   No results found.   1. Chronic back pain       MDM  Patient has an unfortunate history of chronic back problems associated with several prior spinal surgeries. Patient has been seeing her pain management Dr. and has seen her spine surgeon the past. Patient's concerned that her symptoms are progressing and she has not had any recent imaging tests. Patient does not have any signs of any acute neurologic dysfunction. She has good strength. She has normal sensation. I doubt cauda equina syndrome or any other acute neurosurgical emergency.  Do feel that the patient would probably benefit from repeat spinal imaging. However, all those prior tests have been done at another medical facility. I do not feel that there is any emergent reason to have that done this evening. Suggest the patient follow up with her spine surgeon to discuss additional treatment. Patient was given medications for her pain. She understands this plan and agrees.        Celene Kras, MD 11/26/12 2107

## 2012-11-30 ENCOUNTER — Other Ambulatory Visit: Payer: Self-pay | Admitting: Orthopaedic Surgery

## 2012-11-30 DIAGNOSIS — M545 Low back pain, unspecified: Secondary | ICD-10-CM

## 2014-09-29 DIAGNOSIS — Z79891 Long term (current) use of opiate analgesic: Secondary | ICD-10-CM | POA: Diagnosis not present

## 2014-09-29 DIAGNOSIS — M791 Myalgia: Secondary | ICD-10-CM | POA: Diagnosis not present

## 2014-09-29 DIAGNOSIS — M961 Postlaminectomy syndrome, not elsewhere classified: Secondary | ICD-10-CM | POA: Diagnosis not present

## 2014-09-29 DIAGNOSIS — M4726 Other spondylosis with radiculopathy, lumbar region: Secondary | ICD-10-CM | POA: Diagnosis not present

## 2014-09-29 DIAGNOSIS — M5136 Other intervertebral disc degeneration, lumbar region: Secondary | ICD-10-CM | POA: Diagnosis not present

## 2014-09-29 DIAGNOSIS — G894 Chronic pain syndrome: Secondary | ICD-10-CM | POA: Diagnosis not present

## 2014-11-24 DIAGNOSIS — Z79891 Long term (current) use of opiate analgesic: Secondary | ICD-10-CM | POA: Diagnosis not present

## 2014-11-24 DIAGNOSIS — M5136 Other intervertebral disc degeneration, lumbar region: Secondary | ICD-10-CM | POA: Diagnosis not present

## 2014-11-24 DIAGNOSIS — G894 Chronic pain syndrome: Secondary | ICD-10-CM | POA: Diagnosis not present

## 2014-11-24 DIAGNOSIS — M961 Postlaminectomy syndrome, not elsewhere classified: Secondary | ICD-10-CM | POA: Diagnosis not present

## 2014-11-24 DIAGNOSIS — M4726 Other spondylosis with radiculopathy, lumbar region: Secondary | ICD-10-CM | POA: Diagnosis not present

## 2014-12-12 DIAGNOSIS — R509 Fever, unspecified: Secondary | ICD-10-CM | POA: Diagnosis not present

## 2014-12-12 DIAGNOSIS — E039 Hypothyroidism, unspecified: Secondary | ICD-10-CM | POA: Diagnosis not present

## 2014-12-12 DIAGNOSIS — E785 Hyperlipidemia, unspecified: Secondary | ICD-10-CM | POA: Diagnosis not present

## 2014-12-12 DIAGNOSIS — F419 Anxiety disorder, unspecified: Secondary | ICD-10-CM | POA: Diagnosis not present

## 2014-12-12 DIAGNOSIS — E559 Vitamin D deficiency, unspecified: Secondary | ICD-10-CM | POA: Diagnosis not present

## 2014-12-26 ENCOUNTER — Ambulatory Visit (HOSPITAL_COMMUNITY): Payer: Medicare Other | Admitting: Psychiatry

## 2015-01-02 ENCOUNTER — Ambulatory Visit (INDEPENDENT_AMBULATORY_CARE_PROVIDER_SITE_OTHER): Payer: Medicare Other | Admitting: Psychiatry

## 2015-01-02 ENCOUNTER — Encounter (HOSPITAL_COMMUNITY): Payer: Self-pay | Admitting: Psychiatry

## 2015-01-02 VITALS — BP 124/80 | HR 60 | Ht 64.0 in | Wt 205.0 lb

## 2015-01-02 DIAGNOSIS — F411 Generalized anxiety disorder: Secondary | ICD-10-CM | POA: Diagnosis not present

## 2015-01-02 DIAGNOSIS — G8929 Other chronic pain: Secondary | ICD-10-CM | POA: Insufficient documentation

## 2015-01-02 DIAGNOSIS — F0630 Mood disorder due to known physiological condition, unspecified: Secondary | ICD-10-CM | POA: Diagnosis not present

## 2015-01-02 DIAGNOSIS — F064 Anxiety disorder due to known physiological condition: Secondary | ICD-10-CM

## 2015-01-02 DIAGNOSIS — M545 Low back pain, unspecified: Secondary | ICD-10-CM | POA: Insufficient documentation

## 2015-01-02 NOTE — Patient Instructions (Signed)
Patient on xanax only for last 2 weeks. Taper down in next 1 week and stop, before she start getting dependent on benzodiazepine.  Patient can increase buspirone uptil 10mg  tid for anxiety. Patient on pain medications.  Neurontin also helpful as anti anxiety med along with she is taking cymbalta.
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
## 2015-01-13 DIAGNOSIS — R609 Edema, unspecified: Secondary | ICD-10-CM | POA: Diagnosis not present

## 2015-01-13 DIAGNOSIS — F419 Anxiety disorder, unspecified: Secondary | ICD-10-CM | POA: Diagnosis not present

## 2015-02-19 DIAGNOSIS — G894 Chronic pain syndrome: Secondary | ICD-10-CM | POA: Diagnosis not present

## 2015-02-19 DIAGNOSIS — M4726 Other spondylosis with radiculopathy, lumbar region: Secondary | ICD-10-CM | POA: Diagnosis not present

## 2015-02-19 DIAGNOSIS — M961 Postlaminectomy syndrome, not elsewhere classified: Secondary | ICD-10-CM | POA: Diagnosis not present

## 2015-02-19 DIAGNOSIS — Z79891 Long term (current) use of opiate analgesic: Secondary | ICD-10-CM | POA: Diagnosis not present

## 2015-02-19 DIAGNOSIS — M5136 Other intervertebral disc degeneration, lumbar region: Secondary | ICD-10-CM | POA: Diagnosis not present

## 2015-04-13 ENCOUNTER — Encounter (HOSPITAL_COMMUNITY): Payer: Self-pay | Admitting: *Deleted

## 2015-04-13 ENCOUNTER — Emergency Department (HOSPITAL_COMMUNITY)
Admission: EM | Admit: 2015-04-13 | Discharge: 2015-04-14 | Disposition: A | Discharge status: Other Acute Inpatient Hospital | Payer: Medicare Other | Attending: Emergency Medicine | Admitting: Emergency Medicine

## 2015-04-13 DIAGNOSIS — Z872 Personal history of diseases of the skin and subcutaneous tissue: Secondary | ICD-10-CM | POA: Diagnosis not present

## 2015-04-13 DIAGNOSIS — E079 Disorder of thyroid, unspecified: Secondary | ICD-10-CM | POA: Insufficient documentation

## 2015-04-13 DIAGNOSIS — Z720 Tobacco use: Secondary | ICD-10-CM | POA: Diagnosis not present

## 2015-04-13 DIAGNOSIS — G8929 Other chronic pain: Secondary | ICD-10-CM | POA: Insufficient documentation

## 2015-04-13 DIAGNOSIS — F332 Major depressive disorder, recurrent severe without psychotic features: Secondary | ICD-10-CM | POA: Diagnosis present

## 2015-04-13 DIAGNOSIS — Z791 Long term (current) use of non-steroidal anti-inflammatories (NSAID): Secondary | ICD-10-CM | POA: Insufficient documentation

## 2015-04-13 DIAGNOSIS — F333 Major depressive disorder, recurrent, severe with psychotic symptoms: Secondary | ICD-10-CM | POA: Diagnosis present

## 2015-04-13 DIAGNOSIS — Z79899 Other long term (current) drug therapy: Secondary | ICD-10-CM | POA: Insufficient documentation

## 2015-04-13 DIAGNOSIS — Z3202 Encounter for pregnancy test, result negative: Secondary | ICD-10-CM | POA: Diagnosis not present

## 2015-04-13 DIAGNOSIS — Z7982 Long term (current) use of aspirin: Secondary | ICD-10-CM | POA: Diagnosis not present

## 2015-04-13 DIAGNOSIS — R45851 Suicidal ideations: Secondary | ICD-10-CM | POA: Diagnosis not present

## 2015-04-13 DIAGNOSIS — F1310 Sedative, hypnotic or anxiolytic abuse, uncomplicated: Secondary | ICD-10-CM | POA: Insufficient documentation

## 2015-04-13 DIAGNOSIS — F419 Anxiety disorder, unspecified: Secondary | ICD-10-CM | POA: Diagnosis present

## 2015-04-13 DIAGNOSIS — F329 Major depressive disorder, single episode, unspecified: Secondary | ICD-10-CM | POA: Insufficient documentation

## 2015-04-13 DIAGNOSIS — F1110 Opioid abuse, uncomplicated: Secondary | ICD-10-CM | POA: Insufficient documentation

## 2015-04-13 LAB — COMPREHENSIVE METABOLIC PANEL
ALT: 30 U/L (ref 14–54)
AST: 23 U/L (ref 15–41)
Albumin: 4.4 g/dL (ref 3.5–5.0)
Alkaline Phosphatase: 64 U/L (ref 38–126)
Anion gap: 10 (ref 5–15)
BUN: 10 mg/dL (ref 6–20)
CO2: 24 mmol/L (ref 22–32)
Calcium: 9.4 mg/dL (ref 8.9–10.3)
Chloride: 103 mmol/L (ref 101–111)
Creatinine, Ser: 0.67 mg/dL (ref 0.44–1.00)
GFR calc Af Amer: >60 mL/min (ref >60)
GFR calc non Af Amer: >60 mL/min (ref >60)
Glucose, Bld: 100 mg/dL — ABNORMAL HIGH (ref 65–99)
Potassium: 3.7 mmol/L (ref 3.5–5.1)
Sodium: 137 mmol/L (ref 135–145)
Total Bilirubin: 0.7 mg/dL (ref 0.3–1.2)
Total Protein: 7.9 g/dL (ref 6.5–8.1)

## 2015-04-13 LAB — RAPID URINE DRUG SCREEN, HOSP PERFORMED
Amphetamines: NOT DETECTED
Barbiturates: NOT DETECTED
Benzodiazepines: POSITIVE — AB
Cocaine: NOT DETECTED
Opiates: POSITIVE — AB
Tetrahydrocannabinol: NOT DETECTED

## 2015-04-13 LAB — CBC
HCT: 45.9 % (ref 36.0–46.0)
Hemoglobin: 15.4 g/dL — ABNORMAL HIGH (ref 12.0–15.0)
MCH: 28.6 pg (ref 26.0–34.0)
MCHC: 33.6 g/dL (ref 30.0–36.0)
MCV: 85.2 fL (ref 78.0–100.0)
Platelets: 255 10*3/uL (ref 150–400)
RBC: 5.39 MIL/uL — ABNORMAL HIGH (ref 3.87–5.11)
RDW: 14 % (ref 11.5–15.5)
WBC: 12.6 10*3/uL — ABNORMAL HIGH (ref 4.0–10.5)

## 2015-04-13 LAB — I-STAT BETA HCG BLOOD, ED (MC, WL, AP ONLY): I-stat hCG, quantitative: <5 m[IU]/mL (ref <5)

## 2015-04-13 LAB — ETHANOL: Alcohol, Ethyl (B): <5 mg/dL (ref <5)

## 2015-04-13 LAB — SALICYLATE LEVEL: Salicylate Lvl: <4 mg/dL (ref 2.8–30.0)

## 2015-04-13 LAB — ACETAMINOPHEN LEVEL: Acetaminophen (Tylenol), Serum: <10 ug/mL — ABNORMAL LOW (ref 10–30)

## 2015-04-13 MED ORDER — ALUM & MAG HYDROXIDE-SIMETH 200-200-20 MG/5ML PO SUSP
30.0000 mL | ORAL | Status: DC | PRN
  Administered 2015-04-14: 30 mL via ORAL
  Filled 2015-04-13: qty 30

## 2015-04-13 MED ORDER — METHOCARBAMOL 500 MG PO TABS
500.0000 mg | ORAL_TABLET | Freq: Three times a day (TID) | ORAL | Status: DC | PRN
  Administered 2015-04-13 – 2015-04-14 (×2): 500 mg via ORAL
  Filled 2015-04-13 (×2): qty 1

## 2015-04-13 MED ORDER — ZOLPIDEM TARTRATE 5 MG PO TABS
5.0000 mg | ORAL_TABLET | Freq: Every evening | ORAL | Status: DC | PRN
  Administered 2015-04-14: 5 mg via ORAL
  Filled 2015-04-13: qty 1

## 2015-04-13 MED ORDER — NAPROXEN 500 MG PO TABS
500.0000 mg | ORAL_TABLET | Freq: Two times a day (BID) | ORAL | Status: DC | PRN
  Administered 2015-04-14 (×2): 500 mg via ORAL
  Filled 2015-04-13 (×2): qty 1

## 2015-04-13 MED ORDER — ACETAMINOPHEN 325 MG PO TABS
650.0000 mg | ORAL_TABLET | ORAL | Status: DC | PRN
  Administered 2015-04-13 – 2015-04-14 (×2): 650 mg via ORAL
  Filled 2015-04-13 (×2): qty 2

## 2015-04-13 MED ORDER — LOPERAMIDE HCL 2 MG PO CAPS
2.0000 mg | ORAL_CAPSULE | ORAL | Status: DC | PRN
  Administered 2015-04-13: 2 mg via ORAL
  Filled 2015-04-13: qty 1

## 2015-04-13 MED ORDER — ONDANSETRON 4 MG PO TBDP
4.0000 mg | ORAL_TABLET | Freq: Four times a day (QID) | ORAL | Status: DC | PRN
  Filled 2015-04-13: qty 1

## 2015-04-13 MED ORDER — DICYCLOMINE HCL 20 MG PO TABS
20.0000 mg | ORAL_TABLET | Freq: Four times a day (QID) | ORAL | Status: DC | PRN
  Administered 2015-04-14: 20 mg via ORAL
  Filled 2015-04-13: qty 1

## 2015-04-13 MED ORDER — HYDROXYZINE HCL 25 MG PO TABS
25.0000 mg | ORAL_TABLET | Freq: Four times a day (QID) | ORAL | Status: DC | PRN
  Administered 2015-04-14 (×2): 25 mg via ORAL
  Filled 2015-04-13 (×2): qty 1

## 2015-04-13 MED ORDER — ONDANSETRON HCL 4 MG PO TABS: 4.0000 mg | ORAL_TABLET | Freq: Three times a day (TID) | ORAL | Status: DC | PRN

## 2015-04-13 MED ORDER — LORAZEPAM 1 MG PO TABS
1.0000 mg | ORAL_TABLET | Freq: Three times a day (TID) | ORAL | Status: DC | PRN
  Administered 2015-04-13 – 2015-04-14 (×3): 1 mg via ORAL
  Filled 2015-04-13 (×3): qty 1

## 2015-04-13 NOTE — ED Notes (Signed)
Bed: [facility] Expected date: 04/13/15 Expected time: 10:08 PM Means of arrival:  Comments: Hold for Triage 4

## 2015-04-13 NOTE — ED Notes (Signed)
Pt states that she has been going to pain management for 6 years; pt states that she takes Oxycodone 30mg , Fentanyl Patches and Xanax; pt states that they have been cutting back on her medications; pt states that she has been out of her medicine for 1 week because she has been taking it like she used to instead of how it is prescribed; pt states that she just want to end it all because she doesn't want to hurt any more; pt denies plan and states "I have 2 beautiful children to live for"; pt tearful and crying and states "I feel like I had no where to go"

## 2015-04-13 NOTE — ED Notes (Signed)
Patient tearful, anxious. Reports passive SI. Feeling hopeless. "I feel like I'm going crazy. I can't keep going through withdraw". Patient denies HI, AVH at present. Rates feelings of anxiety and depression 10/10. States that sleep and appetite are decreased when she is without her medications. States that she needs help with withdraw/detox.   Encouragement offered.   Q 15 safety checks in place.

## 2015-04-13 NOTE — ED Provider Notes (Signed)
CSN: [id number]     Arrival date & time 04/13/15  1941 History  This chart was scribed for Dalia Heading, PA-C, working with Serita Grit, MD by Steva Colder, ED Scribe. The patient was seen in room WTR4/WLPT4 at 9:45 PM.    Chief Complaint  Patient presents with  . Suicidal  . Opiate Withdrawl       The history is provided by the patient. No language interpreter was used.    HPI Comments: [name] is a 44 y.o. female with a medical hx of depression who presents to the Emergency Department complaining of suicidal. Pt reports that she has been going to pain management for 6 years and that she takes pt states oxycodone, fentanyl patches and xanax. Pt also notes that her pain management clinic has been cutting down on her medications. Pt reports that she is now out of her medications because she has been taking them as they were originally Rx not how they are newly Rx. Pt notes that she "can't adjust to the new Rx" and she is going through withdrawals every month. Pt notes that she does not want to hurt herself and she has two beautiful children and she just wants the withdrawal pain to go away. She denies any other symptoms.   Past Medical History  Diagnosis Date  . Depression   . Thyroid disease   . Rosacea   . Chronic back pain greater than 3 months duration   . Dysuria     has to have self in and out cath    Past Surgical History  Procedure Laterality Date  . Back surgery    . Eye surgery    . Incontinence surgery    . Spinal fusion  2008  . Bladder mesh  2009   No family history on file. History  Substance Use Topics  . Smoking status: Current Every Day Smoker -- 1.00 packs/day    Types: Cigarettes  . Smokeless tobacco: Never Used  . Alcohol Use: Yes     Comment: pt states "When I run out of medicine"   OB History    No data available     Review of Systems  Skin: Negative for color change, rash and wound.  Psychiatric/Behavioral: Positive for suicidal  ideas.      Allergies  Pregabalin; Celebrex; Effexor; Prozac; Symbyax; and Wellbutrin  Home Medications   Prior to Admission medications   Medication Sig Start Date End Date Taking? Authorizing Provider  ALPRAZolam Duanne Moron) 1 MG tablet Take 1 mg by mouth 3 (three) times daily.     Yes Historical Provider, MD  aspirin 325 MG tablet Take 325 mg by mouth every 4 (four) hours as needed for pain.   Yes Historical Provider, MD  busPIRone (BUSPAR) 7.5 MG tablet Take 7.5 mg by mouth 2 (two) times daily.  12/12/14  Yes Historical Provider, MD  DULoxetine (CYMBALTA) 60 MG capsule take 1 capsule by mouth twice a day 12/29/14  Yes Historical Provider, MD  etodolac (LODINE) 300 MG capsule Take 300 mg by mouth 2 (two) times daily.   Yes Historical Provider, MD  fentaNYL (DURAGESIC - DOSED MCG/HR) 25 MCG/HR patch apply 1 patch and replace every 72 hours 02/20/15  Yes Historical Provider, MD  gabapentin (NEURONTIN) 300 MG capsule Take 900 mg by mouth at bedtime.  03/31/15  Yes Historical Provider, MD  levothyroxine (SYNTHROID, LEVOTHROID) 50 MCG tablet Take 50 mcg by mouth daily.     Yes Historical Provider, MD  oxycodone (ROXICODONE) 30 MG immediate release tablet 5 times a day 05/05/11  Yes Historical Provider, MD  pravastatin (PRAVACHOL) 20 MG tablet Take 20 mg by mouth daily.  02/27/15   Historical Provider, MD  sulfamethoxazole-trimethoprim (BACTRIM DS) 800-160 MG per tablet Take 1 tablet by mouth 2 (two) times daily as needed (uniray tract infection).     Historical Provider, MD   BP 146/90 mmHg  Pulse 89  Temp(Src) 98.4 F (36.9 C) (Oral)  Resp 18  Ht 5\' 4"  (1.626 m)  Wt 200 lb (90.719 kg)  BMI 34.31 kg/m2  SpO2 100%  LMP 03/30/2015 Physical Exam  Constitutional: She is oriented to person, place, and time. She appears well-developed and well-nourished. No distress.  HENT:  Head: Normocephalic and atraumatic.  Eyes: EOM are normal.  Neck: Neck supple. No tracheal deviation present.   Cardiovascular: Normal rate, regular rhythm and normal heart sounds.  Exam reveals no gallop and no friction rub.   No murmur heard. Pulmonary/Chest: Effort normal and breath sounds normal. No respiratory distress. She has no wheezes. She has no rales.  Musculoskeletal: Normal range of motion.  Neurological: She is alert and oriented to person, place, and time.  Skin: Skin is warm and dry.  Psychiatric: She has a normal mood and affect. Her behavior is normal.  Nursing note and vitals reviewed.   ED Course  Procedures (including critical care time) DIAGNOSTIC STUDIES: Oxygen Saturation is 100% on RA, nl by my interpretation.    COORDINATION OF CARE: 9:47 PM Discussed treatment plan with pt at bedside and pt agreed to plan.   Labs Review Labs Reviewed  COMPREHENSIVE METABOLIC PANEL - Abnormal; Notable for the following:    Glucose, Bld 100 (*)    All other components within normal limits  ACETAMINOPHEN LEVEL - Abnormal; Notable for the following:    Acetaminophen (Tylenol), Serum <10 (*)    All other components within normal limits  CBC - Abnormal; Notable for the following:    WBC 12.6 (*)    RBC 5.39 (*)    Hemoglobin 15.4 (*)    All other components within normal limits  ETHANOL  SALICYLATE LEVEL  URINE RAPID DRUG SCREEN, HOSP PERFORMED  I-STAT BETA HCG BLOOD, ED ([facility], [facility], [facility] ONLY)    Patient will need TTS assessment    Dalia Heading, PA-C 04/14/15 0543  Dalia Heading, PA-C 04/14/15 2197  Serita Grit, MD 04/16/15 [phone]

## 2015-04-14 ENCOUNTER — Encounter (HOSPITAL_COMMUNITY): Payer: Self-pay | Admitting: *Deleted

## 2015-04-14 ENCOUNTER — Inpatient Hospital Stay (HOSPITAL_COMMUNITY)
Admission: AD | Admit: 2015-04-14 | Discharge: 2015-04-17 | DRG: 885 | Disposition: A | Discharge status: Home / Self Care | Payer: Medicare Other | Source: Intra-hospital | Attending: Psychiatry | Admitting: Psychiatry

## 2015-04-14 DIAGNOSIS — F329 Major depressive disorder, single episode, unspecified: Secondary | ICD-10-CM | POA: Diagnosis not present

## 2015-04-14 DIAGNOSIS — F17210 Nicotine dependence, cigarettes, uncomplicated: Secondary | ICD-10-CM | POA: Diagnosis present

## 2015-04-14 DIAGNOSIS — R45851 Suicidal ideations: Secondary | ICD-10-CM | POA: Insufficient documentation

## 2015-04-14 DIAGNOSIS — F1120 Opioid dependence, uncomplicated: Secondary | ICD-10-CM | POA: Diagnosis not present

## 2015-04-14 DIAGNOSIS — F332 Major depressive disorder, recurrent severe without psychotic features: Secondary | ICD-10-CM | POA: Diagnosis present

## 2015-04-14 DIAGNOSIS — F411 Generalized anxiety disorder: Secondary | ICD-10-CM | POA: Diagnosis present

## 2015-04-14 DIAGNOSIS — M545 Low back pain, unspecified: Secondary | ICD-10-CM | POA: Diagnosis present

## 2015-04-14 DIAGNOSIS — Z981 Arthrodesis status: Secondary | ICD-10-CM

## 2015-04-14 DIAGNOSIS — F419 Anxiety disorder, unspecified: Secondary | ICD-10-CM | POA: Diagnosis present

## 2015-04-14 DIAGNOSIS — F1190 Opioid use, unspecified, uncomplicated: Secondary | ICD-10-CM | POA: Diagnosis not present

## 2015-04-14 DIAGNOSIS — G8929 Other chronic pain: Secondary | ICD-10-CM | POA: Diagnosis present

## 2015-04-14 DIAGNOSIS — F319 Bipolar disorder, unspecified: Secondary | ICD-10-CM | POA: Diagnosis present

## 2015-04-14 DIAGNOSIS — F1310 Sedative, hypnotic or anxiolytic abuse, uncomplicated: Secondary | ICD-10-CM | POA: Diagnosis not present

## 2015-04-14 DIAGNOSIS — F333 Major depressive disorder, recurrent, severe with psychotic symptoms: Secondary | ICD-10-CM | POA: Diagnosis present

## 2015-04-14 MED ORDER — PRAVASTATIN SODIUM 20 MG PO TABS
20.0000 mg | ORAL_TABLET | Freq: Every day | ORAL | Status: DC
  Administered 2015-04-15 – 2015-04-17 (×3): 20 mg via ORAL
  Filled 2015-04-14 (×5): qty 1

## 2015-04-14 MED ORDER — ALUM & MAG HYDROXIDE-SIMETH 200-200-20 MG/5ML PO SUSP
30.0000 mL | ORAL | Status: DC | PRN
  Administered 2015-04-15: 30 mL via ORAL
  Filled 2015-04-14: qty 30

## 2015-04-14 MED ORDER — METHOCARBAMOL 500 MG PO TABS
500.0000 mg | ORAL_TABLET | Freq: Three times a day (TID) | ORAL | Status: DC | PRN
  Administered 2015-04-15 – 2015-04-16 (×2): 500 mg via ORAL
  Filled 2015-04-14 (×2): qty 1

## 2015-04-14 MED ORDER — CLONIDINE HCL 0.1 MG PO TABS: 0.1000 mg | ORAL_TABLET | Freq: Every day | ORAL | Status: DC

## 2015-04-14 MED ORDER — CLONIDINE HCL 0.1 MG PO TABS
0.1000 mg | ORAL_TABLET | ORAL | Status: DC
  Filled 2015-04-14 (×4): qty 1

## 2015-04-14 MED ORDER — MAGNESIUM HYDROXIDE 400 MG/5ML PO SUSP: 30.0000 mL | Freq: Every day | ORAL | Status: DC | PRN

## 2015-04-14 MED ORDER — ACETAMINOPHEN 325 MG PO TABS: 650.0000 mg | ORAL_TABLET | Freq: Four times a day (QID) | ORAL | Status: DC | PRN

## 2015-04-14 MED ORDER — LOPERAMIDE HCL 2 MG PO CAPS
2.0000 mg | ORAL_CAPSULE | ORAL | Status: DC | PRN
  Administered 2015-04-15: 4 mg via ORAL
  Filled 2015-04-14: qty 2

## 2015-04-14 MED ORDER — LEVOTHYROXINE SODIUM 50 MCG PO TABS
50.0000 ug | ORAL_TABLET | Freq: Every day | ORAL | Status: DC
  Filled 2015-04-14: qty 1

## 2015-04-14 MED ORDER — DICYCLOMINE HCL 20 MG PO TABS: 20.0000 mg | ORAL_TABLET | Freq: Four times a day (QID) | ORAL | Status: DC | PRN

## 2015-04-14 MED ORDER — ONDANSETRON 4 MG PO TBDP: 4.0000 mg | ORAL_TABLET | Freq: Four times a day (QID) | ORAL | Status: DC | PRN

## 2015-04-14 MED ORDER — HYDROXYZINE HCL 25 MG PO TABS
25.0000 mg | ORAL_TABLET | Freq: Four times a day (QID) | ORAL | Status: DC | PRN
  Administered 2015-04-15 – 2015-04-16 (×2): 25 mg via ORAL
  Filled 2015-04-14 (×2): qty 1

## 2015-04-14 MED ORDER — TRAZODONE HCL 50 MG PO TABS
50.0000 mg | ORAL_TABLET | Freq: Every evening | ORAL | Status: DC | PRN
  Administered 2015-04-15 – 2015-04-17 (×3): 50 mg via ORAL
  Filled 2015-04-14: qty 28
  Filled 2015-04-14: qty 1
  Filled 2015-04-14: qty 28
  Filled 2015-04-14 (×6): qty 1

## 2015-04-14 MED ORDER — ASPIRIN 325 MG PO TABS
325.0000 mg | ORAL_TABLET | Freq: Every day | ORAL | Status: DC
  Administered 2015-04-15 – 2015-04-17 (×3): 325 mg via ORAL
  Filled 2015-04-14 (×5): qty 1

## 2015-04-14 MED ORDER — TRAZODONE HCL 50 MG PO TABS: 50.0000 mg | ORAL_TABLET | Freq: Every evening | ORAL | Status: DC | PRN

## 2015-04-14 MED ORDER — GABAPENTIN 400 MG PO CAPS
400.0000 mg | ORAL_CAPSULE | Freq: Three times a day (TID) | ORAL | Status: DC
  Administered 2015-04-14 (×2): 400 mg via ORAL
  Filled 2015-04-14 (×2): qty 1

## 2015-04-14 MED ORDER — DULOXETINE HCL 60 MG PO CPEP
60.0000 mg | ORAL_CAPSULE | Freq: Two times a day (BID) | ORAL | Status: DC
  Filled 2015-04-14 (×2): qty 1

## 2015-04-14 MED ORDER — BUSPIRONE HCL 10 MG PO TABS
10.0000 mg | ORAL_TABLET | Freq: Two times a day (BID) | ORAL | Status: DC
  Administered 2015-04-14: 10 mg via ORAL
  Filled 2015-04-14: qty 1

## 2015-04-14 MED ORDER — CLONIDINE HCL 0.1 MG PO TABS
0.1000 mg | ORAL_TABLET | Freq: Four times a day (QID) | ORAL | Status: AC
  Administered 2015-04-14 – 2015-04-16 (×8): 0.1 mg via ORAL
  Filled 2015-04-14 (×11): qty 1

## 2015-04-14 MED ORDER — DULOXETINE HCL 60 MG PO CPEP
120.0000 mg | ORAL_CAPSULE | Freq: Every day | ORAL | Status: DC
Start: 2015-04-14 — End: 2015-04-14
  Administered 2015-04-14: 120 mg via ORAL
  Filled 2015-04-14: qty 2

## 2015-04-14 MED ORDER — GABAPENTIN 300 MG PO CAPS
900.0000 mg | ORAL_CAPSULE | Freq: Every day | ORAL | Status: DC
  Administered 2015-04-14 – 2015-04-16 (×3): 900 mg via ORAL
  Filled 2015-04-14 (×4): qty 3
  Filled 2015-04-14: qty 42
  Filled 2015-04-14: qty 3

## 2015-04-14 MED ORDER — CLONIDINE HCL 0.1 MG PO TABS: 0.1000 mg | ORAL_TABLET | ORAL | Status: DC

## 2015-04-14 MED ORDER — CLONIDINE HCL 0.1 MG PO TABS
0.1000 mg | ORAL_TABLET | Freq: Four times a day (QID) | ORAL | Status: DC
  Administered 2015-04-14: 0.1 mg via ORAL
  Filled 2015-04-14: qty 1

## 2015-04-14 MED ORDER — LEVOTHYROXINE SODIUM 50 MCG PO TABS
50.0000 ug | ORAL_TABLET | Freq: Every day | ORAL | Status: DC
  Administered 2015-04-15 – 2015-04-17 (×3): 50 ug via ORAL
  Filled 2015-04-14 (×4): qty 1
  Filled 2015-04-14: qty 2
  Filled 2015-04-14: qty 1

## 2015-04-14 MED ORDER — DULOXETINE HCL 60 MG PO CPEP
60.0000 mg | ORAL_CAPSULE | Freq: Two times a day (BID) | ORAL | Status: DC
  Filled 2015-04-14 (×3): qty 1

## 2015-04-14 MED ORDER — BUSPIRONE HCL 15 MG PO TABS
7.5000 mg | ORAL_TABLET | Freq: Two times a day (BID) | ORAL | Status: DC
  Administered 2015-04-15 – 2015-04-17 (×5): 7.5 mg via ORAL
  Filled 2015-04-14 (×2): qty 1
  Filled 2015-04-14: qty 14
  Filled 2015-04-14 (×4): qty 1
  Filled 2015-04-14: qty 14
  Filled 2015-04-14: qty 1

## 2015-04-14 NOTE — ED Notes (Signed)
Patient aware of her transfer to [facility]

## 2015-04-14 NOTE — BH Assessment (Signed)
Per Delila Pereyra, pt appears appropriate for [facility], however, nursing staff is critically low until 1100. Pt will be reviewed by [facility], later in AM for possible placement at [facility].   Lear Ng, [facility] Triage Specialist 04/14/2015 5:03 AM

## 2015-04-14 NOTE — ED Notes (Signed)
Patient accepted to [facility] by Reginold Agent, NP and Dr. Darleene Cleaver. Room assignment 307-1. Support paperwork completed. Patient to be transferred to [facility] after 8pm.

## 2015-04-14 NOTE — ED Notes (Addendum)
Report given to [facility]. Will notify [place] in about an hour for transportation.

## 2015-04-14 NOTE — ED Notes (Signed)
Patient complained of anxiety and heart burn. PO PRN of Vistaril 25 mg for anxiety and MAALOX 300 ML for heart burn given as prescribed. Will continue to monitor patient.

## 2015-04-14 NOTE — ED Notes (Signed)
Patient c/o body aches and anxiety r/t withdrawal.  PRN medication requested and received.

## 2015-04-14 NOTE — ED Notes (Signed)
Introduced self to pt, who was tearful, anxious, and complaining of spasms and chronic pain. She said she slept poorly as well. She spoke of wanting to be with her children and said she was uncertain if she had SI. "I don't know any more," she said. When prompted, she said she might walk into traffic if she had SI. She denied HI and AVH. PRNs given as applicable/ordered. Will continue to monitor for needs/safety.

## 2015-04-14 NOTE — Consult Note (Signed)
[facility] Face-to-Face Psychiatry Consult   Reason for Consult:  Major depressive disorder, recurrent severe, Opoid use disorder, severe, Benzodiazepine use disorder, severe Referring Physician:  EDP Patient Identification: LYNNDA WIERSMA MRN:  [id number] Principal Diagnosis: Major depressive disorder, recurrent severe without psychotic features Diagnosis:   Patient Active Problem List   Diagnosis Date Noted  . Major depressive disorder, recurrent severe without psychotic features [F33.2] 04/14/2015    Priority: High  . Severe recurrent major depression w/psychotic features, mood-congruent [F33.3] 04/14/2015  . Anxiety disorder [F41.9] 04/14/2015  . Chronic LBP [M54.5, G89.29] 01/02/2015  . Adult hypothyroidism [E03.9] 06/25/2012    Total Time spent with patient: 1 hour  Subjective:   Lasandra L Hietpas is a 44 y.o. female patient admitted with Major depressive disorder, recurrent severe, Opoid use disorder, severe, Benzodiazepine use disorder, severe  HPI: Caucasian female, 45 years old was evaluated for increased feelings of depression, anxiety and asking to get off " all Narcotic pain medications"  Patient reports that she has struggled with depression, anxiety and Back pain that has led to her taking pain medications for 17 years.  Patient reports that she has been suffering from Depression for "most of my life"   Patient reports that she has been taking Xanax for anxiety and for sleep for almost 7 years.  Patient sees a provider at [facility] but have never been been by a Management consultant.  Patient was tearful and stated that she does not want to kill herself for the love she has for her kids.  Patient denies previous suicide attempt, homicidal thought, auditory/ visual hallucinations.  Patient reports poor sleep unless she takes her Xanax.  She reports good appetite and stated that her reason for coming in was to get off Narcotic medications and seek treatment from a  Psychiatrist and counselor..  She has been accepted for admission and we will be seeking placement at any facility with available beds.  HPI Elements:   Location:  MDD disorder, recurrent, severe, Opioiod use disorder, severe, Benzodiazepine use disorder, severe, Suicidal ideation, . Quality:  severe. Severity:  severe. Timing:  Acute. Duration:  Chronic Mental illness. Context:  Brought self in for increased depression and asking to be taken off all Narcotics..  Past Medical History:  Past Medical History  Diagnosis Date  . Depression   . Thyroid disease   . Rosacea   . Chronic back pain greater than 3 months duration   . Dysuria     has to have self in and out cath     Past Surgical History  Procedure Laterality Date  . Back surgery    . Eye surgery    . Incontinence surgery    . Spinal fusion  2008  . Bladder mesh  2009   Family History: No family history on file. Social History:  History  Alcohol Use  . Yes    Comment: pt states "When I run out of medicine"     History  Drug Use No    History   Social History  . Marital Status: Legally Separated    Spouse Name: N/A  . Number of Children: N/A  . Years of Education: N/A   Social History Main Topics  . Smoking status: Current Every Day Smoker -- 1.00 packs/day    Types: Cigarettes  . Smokeless tobacco: Never Used  . Alcohol Use: Yes     Comment: pt states "When I run out of medicine"  . Drug Use: No  .  Sexual Activity: Not on file   Other Topics Concern  . None   Social History Narrative   Additional Social History:    Pain Medications: Oxycodone 30 mg; Fentynal patch 22m Prescriptions: See PTA medication list Over the Counter: see PTA medication list. History of alcohol / drug use?: Yes Name of Substance 1: ETOH (beer) 1 - Age of First Use: 732862years of age 44- Amount (size/oz): will drink to try to numb physical pain.  Will drink around 5 beers at a time. 1 - Frequency: About once or twice in a  month 1 - Duration: On-going 1 - Last Use / Amount: 08/07 drank five beers                   Allergies:   Allergies  Allergen Reactions  . Pregabalin Anaphylaxis  . Celebrex [Celecoxib] Nausea And Vomiting  . Effexor [Venlafaxine Hcl]     Serotonin   . Prozac [Fluoxetine Hcl] Swelling  . Symbyax [Olanzapine-Fluoxetine Hcl] Swelling  . Wellbutrin [Bupropion Hcl]     Labs:  Results for orders placed or performed during the hospital encounter of 04/13/15 (from the past 48 hour(s))  Comprehensive metabolic panel     Status: Abnormal   Collection Time: 04/13/15  8:28 PM  Result Value Ref Range   Sodium 137 135 - 145 mmol/L   Potassium 3.7 3.5 - 5.1 mmol/L   Chloride 103 101 - 111 mmol/L   CO2 24 22 - 32 mmol/L   Glucose, Bld 100 (H) 65 - 99 mg/dL   BUN 10 6 - 20 mg/dL   Creatinine, Ser 0.67 0.44 - 1.00 mg/dL   Calcium 9.4 8.9 - 10.3 mg/dL   Total Protein 7.9 6.5 - 8.1 g/dL   Albumin 4.4 3.5 - 5.0 g/dL   AST 23 15 - 41 U/L   ALT 30 14 - 54 U/L   Alkaline Phosphatase 64 38 - 126 U/L   Total Bilirubin 0.7 0.3 - 1.2 mg/dL   GFR calc non Af Amer >60 >60 mL/min   GFR calc Af Amer >60 >60 mL/min    Comment: (NOTE) The eGFR has been calculated using the CKD EPI equation. This calculation has not been validated in all clinical situations. eGFR's persistently <60 mL/min signify possible Chronic Kidney Disease.    Anion gap 10 5 - 15  Ethanol (ETOH)     Status: None   Collection Time: 04/13/15  8:28 PM  Result Value Ref Range   Alcohol, Ethyl (B) <5 <5 mg/dL    Comment:        LOWEST DETECTABLE LIMIT FOR SERUM ALCOHOL IS 5 mg/dL FOR MEDICAL PURPOSES ONLY   Salicylate level     Status: None   Collection Time: 04/13/15  8:28 PM  Result Value Ref Range   Salicylate Lvl <<9.42.8 - 30.0 mg/dL  Acetaminophen level     Status: Abnormal   Collection Time: 04/13/15  8:28 PM  Result Value Ref Range   Acetaminophen (Tylenol), Serum <10 (L) 10 - 30 ug/mL    Comment:         THERAPEUTIC CONCENTRATIONS VARY SIGNIFICANTLY. A RANGE OF 10-30 ug/mL MAY BE AN EFFECTIVE CONCENTRATION FOR MANY PATIENTS. HOWEVER, SOME ARE BEST TREATED AT CONCENTRATIONS OUTSIDE THIS RANGE. ACETAMINOPHEN CONCENTRATIONS >150 ug/mL AT 4 HOURS AFTER INGESTION AND >50 ug/mL AT 12 HOURS AFTER INGESTION ARE OFTEN ASSOCIATED WITH TOXIC REACTIONS.   CBC     Status: Abnormal   Collection Time: 04/13/15  8:28 PM  Result Value Ref Range   WBC 12.6 (H) 4.0 - 10.5 K/uL   RBC 5.39 (H) 3.87 - 5.11 MIL/uL   Hemoglobin 15.4 (H) 12.0 - 15.0 g/dL   HCT 45.9 36.0 - 46.0 %   MCV 85.2 78.0 - 100.0 fL   MCH 28.6 26.0 - 34.0 pg   MCHC 33.6 30.0 - 36.0 g/dL   RDW 14.0 11.5 - 15.5 %   Platelets 255 150 - 400 K/uL  I-Stat beta hCG blood, ED ([facility], [facility], [facility] only)     Status: None   Collection Time: 04/13/15  8:39 PM  Result Value Ref Range   I-stat hCG, quantitative <5.0 <5 mIU/mL   Comment 3            Comment:   GEST. AGE      CONC.  (mIU/mL)   <=1 WEEK        5 - 50     2 WEEKS       50 - 500     3 WEEKS       100 - 10,000     4 WEEKS     1,000 - 30,000        FEMALE AND NON-PREGNANT FEMALE:     LESS THAN 5 mIU/mL   Urine rapid drug screen (hosp performed) (Not at [facility])     Status: Abnormal   Collection Time: 04/13/15  9:49 PM  Result Value Ref Range   Opiates POSITIVE (A) NONE DETECTED   Cocaine NONE DETECTED NONE DETECTED   Benzodiazepines POSITIVE (A) NONE DETECTED   Amphetamines NONE DETECTED NONE DETECTED   Tetrahydrocannabinol NONE DETECTED NONE DETECTED   Barbiturates NONE DETECTED NONE DETECTED    Comment:        DRUG SCREEN FOR MEDICAL PURPOSES ONLY.  IF CONFIRMATION IS NEEDED FOR ANY PURPOSE, NOTIFY LAB WITHIN 5 DAYS.        LOWEST DETECTABLE LIMITS FOR URINE DRUG SCREEN Drug Class       Cutoff (ng/mL) Amphetamine      1000 Barbiturate      200 Benzodiazepine   726 Tricyclics       203 Opiates          300 Cocaine          300 THC              50     Vitals: Blood  pressure 111/78, pulse 64, temperature 98.1 F (36.7 C), temperature source Oral, resp. rate 18, height 5' 4"  (1.626 m), weight 90.719 kg (200 lb), last menstrual period 03/30/2015, SpO2 100 %.  Risk to Self: Suicidal Ideation: Yes-Currently Present Suicidal Intent: Yes-Currently Present Is patient at risk for suicide?: Yes Suicidal Plan?: Yes-Currently Present Specify Current Suicidal Plan: Step in front of a bus Access to Means: Yes Specify Access to Suicidal Means: Traffic What has been your use of drugs/alcohol within the last 12 months?: ETOH a little How many times?: 0 Other Self Harm Risks: None Triggers for Past Attempts: None known Intentional Self Injurious Behavior: None Risk to Others: Homicidal Ideation: No Thoughts of Harm to Others: No Current Homicidal Intent: No Current Homicidal Plan: No Access to Homicidal Means: No Identified Victim: No one History of harm to others?: No Assessment of Violence: None Noted Violent Behavior Description: None reported Does patient have access to weapons?: No Criminal Charges Pending?: No Does patient have a court date: No Prior Inpatient Therapy: Prior Inpatient Therapy: No Prior  Therapy Dates: N/a Prior Therapy Facilty/Provider(s): N/A Reason for Treatment: N/A Prior Outpatient Therapy: Prior Outpatient Therapy: No Prior Therapy Dates: None Prior Therapy Facilty/Provider(s): N/A Reason for Treatment: N/A Does patient have an ACCT team?: No Does patient have Intensive In-House Services?  : No Does patient have [facility] services? : No Does patient have [facility] services?: No  Current Facility-Administered Medications  Medication Dose Route Frequency Provider Last Rate Last Dose  . acetaminophen (TYLENOL) tablet 650 mg  650 mg Oral Q4H PRN Dalia Heading, PA-C   650 mg at 04/14/15 0716  . alum & mag hydroxide-simeth (MAALOX/MYLANTA) 200-200-20 MG/5ML suspension 30 mL  30 mL Oral PRN Dalia Heading, PA-C      . busPIRone  (BUSPAR) tablet 10 mg  10 mg Oral BID Alonie Gazzola   10 mg at 04/14/15 1140  . dicyclomine (BENTYL) tablet 20 mg  20 mg Oral Q6H PRN Laverle Hobby, PA-C   20 mg at 04/14/15 0230  . DULoxetine (CYMBALTA) DR capsule 120 mg  120 mg Oral Daily Delfin Gant, NP   120 mg at 04/14/15 1156  . gabapentin (NEURONTIN) capsule 400 mg  400 mg Oral TID Mihail Prettyman   400 mg at 04/14/15 1140  . hydrOXYzine (ATARAX/VISTARIL) tablet 25 mg  25 mg Oral Q6H PRN Laverle Hobby, PA-C   25 mg at 04/14/15 1143  . [START ON 04/15/2015] levothyroxine (SYNTHROID, LEVOTHROID) tablet 50 mcg  50 mcg Oral QAC breakfast Orvan Papadakis      . loperamide (IMODIUM) capsule 2-4 mg  2-4 mg Oral PRN Laverle Hobby, PA-C   2 mg at 04/13/15 2250  . LORazepam (ATIVAN) tablet 1 mg  1 mg Oral Q8H PRN Dalia Heading, PA-C   1 mg at 04/14/15 0716  . methocarbamol (ROBAXIN) tablet 500 mg  500 mg Oral Q8H PRN Laverle Hobby, PA-C   500 mg at 04/14/15 9470  . naproxen (NAPROSYN) tablet 500 mg  500 mg Oral BID PRN Laverle Hobby, PA-C   500 mg at 04/14/15 0230  . ondansetron (ZOFRAN-ODT) disintegrating tablet 4 mg  4 mg Oral Q6H PRN Laverle Hobby, PA-C      . traZODone (DESYREL) tablet 50 mg  50 mg Oral QHS PRN Pierra Skora       Current Outpatient Prescriptions  Medication Sig Dispense Refill  . ALPRAZolam (XANAX) 1 MG tablet Take 1 mg by mouth 3 (three) times daily.      Marland Kitchen aspirin 325 MG tablet Take 325 mg by mouth every 4 (four) hours as needed for pain.    . busPIRone (BUSPAR) 7.5 MG tablet Take 7.5 mg by mouth 2 (two) times daily.     . DULoxetine (CYMBALTA) 60 MG capsule take 1 capsule by mouth twice a day    . etodolac (LODINE) 300 MG capsule Take 300 mg by mouth 2 (two) times daily.    . fentaNYL (DURAGESIC - DOSED MCG/HR) 25 MCG/HR patch apply 1 patch and replace every 72 hours  0  . gabapentin (NEURONTIN) 300 MG capsule Take 900 mg by mouth at bedtime.   0  . levothyroxine (SYNTHROID, LEVOTHROID) 50 MCG  tablet Take 50 mcg by mouth daily.      Marland Kitchen oxycodone (ROXICODONE) 30 MG immediate release tablet 5 times a day    . pravastatin (PRAVACHOL) 20 MG tablet Take 20 mg by mouth daily.   0  . sulfamethoxazole-trimethoprim (BACTRIM DS) 800-160 MG per tablet Take 1 tablet by mouth 2 (  two) times daily as needed (uniray tract infection).       Musculoskeletal: Strength & Muscle Tone: seen in bed lying down Gait & Station: seen in bed lying down Patient leans: seen in bed lying down  Psychiatric Specialty Exam: Physical Exam  Review of Systems  Constitutional: Negative.   HENT: Negative.   Eyes: Negative.   Respiratory: Negative.   Cardiovascular: Negative.   Gastrointestinal: Negative.   Genitourinary: Negative.   Musculoskeletal: Negative.   Skin: Negative.   Neurological: Negative.   Endo/Heme/Allergies: Negative.     Blood pressure 111/78, pulse 64, temperature 98.1 F (36.7 C), temperature source Oral, resp. rate 18, height 5' 4"  (1.626 m), weight 90.719 kg (200 lb), last menstrual period 03/30/2015, SpO2 100 %.Body mass index is 34.31 kg/(m^2).  General Appearance: Casual and Fairly Groomed  Eye Contact::  Good  Speech:  Clear and Coherent and Normal Rate  Volume:  Normal  Mood:  Anxious and Depressed  Affect:  Congruent, Depressed and Flat  Thought Process:  Coherent, Goal Directed and Intact  Orientation:  Full (Time, Place, and Person)  Thought Content:  WDL  Suicidal Thoughts:  Yes.  without intent/plan  Homicidal Thoughts:  No  Memory:  Immediate;   Good Recent;   Good Remote;   Good  Judgement:  Impaired  Insight:  Shallow  Psychomotor Activity:  Psychomotor Retardation  Concentration:  Good  Recall:  NA  Fund of Knowledge:Fair  Language: Good  Akathisia:  NA  Handed:  Right  AIMS (if indicated):     Assets:  Desire for Improvement  ADL's:  Intact  Cognition: WNL  Sleep:      Medical Decision Making: Review of Psycho-Social Stressors (1), Established Problem,  Worsening (2), Independent Review of image, tracing or specimen (2) and Review of Medication Regimen & Side Effects (2)  Treatment Plan Summary: Daily contact with patient to assess and evaluate symptoms and progress in treatment and Medication management  Plan:  Resume all home medication, We will use our clonidine protocol for Opiate detox. Disposition: Admit, seek placement  Delfin Gant   PMHNP-BC 04/14/2015 11:58 AM Patient seen face-to-face for psychiatric evaluation, chart reviewed and case discussed with the physician extender and developed treatment plan. Reviewed the information documented and agree with the treatment plan. Corena Pilgrim, MD
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
## 2015-04-15 ENCOUNTER — Encounter (HOSPITAL_COMMUNITY): Payer: Self-pay | Admitting: Psychiatry

## 2015-04-15 DIAGNOSIS — R45851 Suicidal ideations: Secondary | ICD-10-CM

## 2015-04-15 DIAGNOSIS — F1310 Sedative, hypnotic or anxiolytic abuse, uncomplicated: Secondary | ICD-10-CM

## 2015-04-15 DIAGNOSIS — F319 Bipolar disorder, unspecified: Secondary | ICD-10-CM | POA: Diagnosis present

## 2015-04-15 DIAGNOSIS — F1190 Opioid use, unspecified, uncomplicated: Secondary | ICD-10-CM

## 2015-04-15 DIAGNOSIS — F332 Major depressive disorder, recurrent severe without psychotic features: Principal | ICD-10-CM

## 2015-04-15 DIAGNOSIS — F411 Generalized anxiety disorder: Secondary | ICD-10-CM | POA: Diagnosis present

## 2015-04-15 LAB — LIPID PANEL
Cholesterol: 247 mg/dL — ABNORMAL HIGH (ref 0–200)
HDL: 37 mg/dL — ABNORMAL LOW (ref >40)
LDL Cholesterol: 174 mg/dL — ABNORMAL HIGH (ref 0–99)
Total CHOL/HDL Ratio: 6.7 RATIO
Triglycerides: 179 mg/dL — ABNORMAL HIGH (ref <150)
VLDL: 36 mg/dL (ref 0–40)

## 2015-04-15 LAB — TSH: TSH: 1.907 u[IU]/mL (ref 0.350–4.500)

## 2015-04-15 MED ORDER — NICOTINE 21 MG/24HR TD PT24
21.0000 mg | MEDICATED_PATCH | Freq: Every day | TRANSDERMAL | Status: DC
  Administered 2015-04-15 – 2015-04-17 (×3): 21 mg via TRANSDERMAL
  Filled 2015-04-15 (×3): qty 1
  Filled 2015-04-15: qty 14
  Filled 2015-04-15: qty 1

## 2015-04-15 MED ORDER — PANTOPRAZOLE SODIUM 20 MG PO TBEC
20.0000 mg | DELAYED_RELEASE_TABLET | Freq: Every day | ORAL | Status: DC
  Administered 2015-04-15 – 2015-04-17 (×3): 20 mg via ORAL
  Filled 2015-04-15 (×6): qty 1

## 2015-04-15 MED ORDER — DULOXETINE HCL 60 MG PO CPEP
120.0000 mg | ORAL_CAPSULE | Freq: Every day | ORAL | Status: DC
  Administered 2015-04-15 – 2015-04-17 (×3): 120 mg via ORAL
  Filled 2015-04-15 (×2): qty 2
  Filled 2015-04-15: qty 28
  Filled 2015-04-15 (×2): qty 2

## 2015-04-15 MED ORDER — ARIPIPRAZOLE 2 MG PO TABS
2.0000 mg | ORAL_TABLET | Freq: Every day | ORAL | Status: DC
  Administered 2015-04-15 – 2015-04-17 (×3): 2 mg via ORAL
  Filled 2015-04-15 (×4): qty 1
  Filled 2015-04-15: qty 14

## 2015-04-15 MED ORDER — LORATADINE 10 MG PO TABS
10.0000 mg | ORAL_TABLET | Freq: Every day | ORAL | Status: DC | PRN
  Administered 2015-04-15 – 2015-04-17 (×3): 10 mg via ORAL
  Filled 2015-04-15 (×3): qty 1

## 2015-04-15 MED ORDER — LORAZEPAM 1 MG PO TABS
1.0000 mg | ORAL_TABLET | Freq: Four times a day (QID) | ORAL | Status: DC | PRN
  Administered 2015-04-15 – 2015-04-17 (×6): 1 mg via ORAL
  Filled 2015-04-15 (×6): qty 1

## 2015-04-15 MED ORDER — LAMOTRIGINE 25 MG PO TABS
25.0000 mg | ORAL_TABLET | Freq: Every day | ORAL | Status: DC
  Administered 2015-04-15 – 2015-04-17 (×3): 25 mg via ORAL
  Filled 2015-04-15 (×2): qty 1
  Filled 2015-04-15: qty 14
  Filled 2015-04-15 (×2): qty 1

## 2015-04-15 NOTE — Progress Notes (Signed)
Patient ID: [name], female   DOB: July 30, 1971, 44 y.o.   MRN: [id number] PER STATE REGULATIONS 482.30  THIS CHART WAS REVIEWED FOR MEDICAL NECESSITY WITH RESPECT TO THE PATIENT'S ADMISSION/DURATION OF STAY.  NEXT REVIEW DATE:04/18/15  Roma Schanz, RN, BSN CASE MANAGER

## 2015-04-15 NOTE — Progress Notes (Signed)
Recreation Therapy Notes  Date: 08.10.16 Time: 930 am Location: [address] Group Room  Group Topic: Stress Management  Goal Area(s) Addresses:  Patient will verbalize importance of using healthy stress management.  Patient will identify positive emotions associated with healthy stress management.   Behavioral Response:  Engaged  Intervention: Stress Management  Activity : Progressive Muscle Relaxation. LRT will introduce and instruct patients on the stress management technique of progressive muscle relaxation. Patients were asked to follow a long with a script read a loud by LRT to participate in the stress management technique of progressive muscle relaxation.  Education: Stress Management, Discharge Planning.   Education Outcome: Acknowledges edcuation/In group clarification offered/Needs additional education  Clinical Observations/Feedback: Patient attended the group.    Victorino Sparrow, LRT/CTRS         Victorino Sparrow A 04/15/2015 3:45 PM

## 2015-04-15 NOTE — Progress Notes (Addendum)
DAR Note: Pt was visible on the unit.  Pleasant and cooperative.  Attending groups.  Minimal interaction with peers but interacts with roommate.  Denies SI/HI or A/V hallucinations.  Self inventory completed with 10/10 for depression and 10/10 for anxiety.  She endorses feeling hopeless and helpless 10/10.  She reports that she thought she would be out of her withdrawal period and was hoping not to stay in the hospital to long because she is needing to get home as soon as possible to take care of her family.  Talked with her about making sure that she has a good aftercare plan to help her continue to remain clean.  She was given multiple prn's for pain, anxiety, heartburn with good relief.  VSS.  She reports that she doesn't like the clonidine and refused to take her 1700 dose of medication.  EKG completed per order, awaiting results.  Medication education provided for Lamictal and Abilify.  She was able to verbalize understanding and encouraged her to contact staff if she had any further questions.  [name] reports that she has some bladder leakage at times and needs feminine napkins, pads provided and stated that this is not a new problem.  Support and encouragement provided.  Q 15 minute checks for safety maintained.

## 2015-04-15 NOTE — Plan of Care (Signed)
Problem: Alteration in mood & ability to function due to Goal: STG: Patient verbalizes decreases in signs of withdrawal Outcome: Progressing She was able to discuss her current symptoms of withdrawal.  She reports that she continues to experience withdrawal symptoms.

## 2015-04-15 NOTE — Tx Team (Addendum)
Initial Interdisciplinary Treatment Plan   PATIENT STRESSORS: Health problems Substance abuse   PATIENT STRENGTHS: Ability for insight Capable of independent living Motivation for treatment/growth Supportive family/friends   PROBLEM LIST: Problem List/Patient Goals Date to be addressed Date deferred Reason deferred Estimated date of resolution  Opiate Abuse 04/15/15      " I can't do this any more";  04/15/15     "I can't live this way anymore" 04/15/15     Suicide Thoughts 04/15/15                                    DISCHARGE CRITERIA:  Ability to meet basic life and health needs Improved stabilization in mood, thinking, and/or behavior Motivation to continue treatment in a less acute level of care Withdrawal symptoms are absent or subacute and managed without 24-hour nursing intervention  PRELIMINARY DISCHARGE PLAN: Attend PHP/IOP Attend 12-step recovery group Return to previous living arrangement  PATIENT/FAMIILY INVOLVEMENT: This treatment plan has been presented to and reviewed with the patient, [name], and/or family member.  The patient and family have been given the opportunity to ask questions and make suggestions.  [place], [place] 04/15/2015, 12:10 AM

## 2015-04-15 NOTE — H&P (Signed)
Psychiatric Admission Assessment Adult  Patient Identification: [name] MRN:  [id number] Date of Evaluation:  04/15/2015 Chief Complaint:  Major Depression, Recurrent, Severe Opiate Use Disorder, Severe Benzodiazepine Use Disorder, Severe Principal Diagnosis: <principal problem not specified> Diagnosis:   Patient Active Problem List   Diagnosis Date Noted  . Severe recurrent major depression w/psychotic features, mood-congruent [F33.3] 04/14/2015  . Anxiety disorder [F41.9] 04/14/2015  . Major depressive disorder, recurrent severe without psychotic features [F33.2] 04/14/2015  . Suicidal ideations [R45.851]   . Chronic LBP [M54.5, G89.29] 01/02/2015  . Adult hypothyroidism [E03.9] 06/25/2012     History of Present Illness: [name] is an 44 y.o. Female, initially seen by TTS with suicidal ideations.  She states that she came in wanting to get help with opiates.  She reported having has 6 back surgeries with 2 of them being spinal fusions/pins/rods.  Since 2008 she has been on opiate control for the pain.  She is an established patient of [facility].  She does report that she ran out of her Oxy 30 mg (she can take 4-6x/day) and will not be able to get any more until Sept.  She states that she is tired of being dependent on opiates.  She states finding herself more and more dependent to the point that it was all she ever thought about.  She also felt that her depression worsened in the last year.    She reports carrying a diagnosis of PMDD wherein certain times of the month, she can hardly get out of bed.  At other times, she would cry as soon as she woke up.  She had been on Cymbalta 120 mg over the past year and states this helps her.  She last saw a mental health health professional 3 mos ago in a [place] facility. Seen today, patient was very tearful.  "I have a beautiful home, my BF is supportive of me, I want to be a better mom,  I try to get put of bed to  do errands but I don't want to leave out of the house."  Per  TTS: Patient says that she has been having thoughts of killing herself. She cites stepping in front of a bus as a plan. Patient has been very depressed over the last year. She says she has a lot of pain also. She says "I am not asking for opiates." She goes to a pain management doctor, her name is Kriste Basque at [facility]. Patient says that over the last three months or more she has been running out of medication before it is due to run out. She has been supplementing for those 5-7 days per month by drinking beer to ease pain. She says that this is not working. Chronic pain is from degenerative disc disease which has resulted in her having some rods & pins in lower back and spinal fusion. She said that the pain that she is experiencing now is the same as when she has had to have surgery in the past. Patient says that she sometimes has to self cath when she needs to urinate.  Patient mentioned feeling bad because she has some passive thoughts about harming her two children before killing herself. She says she would not do this however. Patient denies any A/V hallucinations. Patient says that she feels that she cannot get motivated to do things for herself. Patient says that she gets OCD about the locks on the doors at night, having to stare  at them for a few minutes before she acknowledges that they are locked. Patient has been isolating herself from others.  Patient has been living with her fiance and her two children. Patient says that she had feelings of guilt about not being able to care for her children like she used to do. Patient talks about being upset with her ex-husband who lives overseas. She currently lives with her children and fiance.  Elements:  Location:  Opiate dependence and depression. Quality:  feelings of hopelessness, worthless, helpless. Duration:  in the last year. Context:   worsening depression, see HPI. Associated Signs/Symptoms: Depression Symptoms:  depressed mood, difficulty concentrating, hopelessness, (Hypo) Manic Symptoms:  Irritable Mood, Anxiety Symptoms:  Excessive Worry, Psychotic Symptoms:  NA PTSD Symptoms: NA Total Time spent with patient: 30 minutes  Past Medical History:  Past Medical History  Diagnosis Date  . Depression   . Thyroid disease   . Rosacea   . Chronic back pain greater than 3 months duration   . Dysuria     has to have self in and out cath     Past Surgical History  Procedure Laterality Date  . Back surgery    . Eye surgery    . Incontinence surgery    . Spinal fusion  2008  . Bladder mesh  2009   Family History: History reviewed. No pertinent family history. Social History:  History  Alcohol Use  . Yes    Comment: pt states "When I run out of medicine"     History  Drug Use No    Social History   Social History  . Marital Status: Legally Separated    Spouse Name: N/A  . Number of Children: N/A  . Years of Education: N/A   Social History Main Topics  . Smoking status: Current Every Day Smoker -- 1.00 packs/day    Types: Cigarettes  . Smokeless tobacco: Never Used  . Alcohol Use: Yes     Comment: pt states "When I run out of medicine"  . Drug Use: No  . Sexual Activity: Not Asked   Other Topics Concern  . None   Social History Narrative   Additional Social History:    Pain Medications: Oxycodone History of alcohol / drug use?: Yes (ocassionaslly) Negative Consequences of Use: Personal relationships Withdrawal Symptoms: Fever / Chills    Musculoskeletal: Strength & Muscle Tone: within normal limits Gait & Station: normal Patient leans: N/A  Psychiatric Specialty Exam: Physical Exam  Vitals reviewed. Psychiatric: Her mood appears anxious. She exhibits a depressed mood.    Review of Systems  All other systems reviewed and are negative.   Blood pressure 127/77, pulse 90,  temperature 98.7 F (37.1 C), temperature source Oral, resp. rate 18, height 5\' 4"  (1.626 m), weight 90.266 kg (199 lb), last menstrual period 03/30/2015, SpO2 100 %.Body mass index is 34.14 kg/(m^2).  General Appearance: Disheveled  Eye Contact::  Good  Speech:  Clear and Coherent   Volume:  Normal  Mood:  Depressed, Hopeless and Worthless  Affect:  Appropriate  Thought Process:  Goal Directed and Intact  Orientation:  Full (Time, Place, and Person)  Thought Content:  Rumination  Suicidal Thoughts:  Yes.  with intent/plan  Homicidal Thoughts:  No  Memory:  Immediate;   Good Recent;   Good Remote;   Good  Judgement:  Intact  Insight:  Present  Psychomotor Activity:  Normal  Concentration:  Good  Recall:  Good  Fund of Knowledge:Good  Language: Good  Akathisia:  Negative  Handed:  Right  AIMS (if indicated):     Assets:  Desire for Improvement Housing Intimacy Physical Health Resilience Social Support  ADL's:  Intact  Cognition: WNL  Sleep:  Number of Hours: 4.25   Risk to Self: Is patient at risk for suicide?: Yes Risk to Others:   Prior Inpatient Therapy:   Prior Outpatient Therapy:    Alcohol Screening: 1. How often do you have a drink containing alcohol?: Monthly or less 2. How many drinks containing alcohol do you have on a typical day when you are drinking?: 5 or 6 3. How often do you have six or more drinks on one occasion?: Never Preliminary Score: 2 4. How often during the last year have you found that you were not able to stop drinking once you had started?: Never 5. How often during the last year have you failed to do what was normally expected from you becasue of drinking?: Never 6. How often during the last year have you needed a first drink in the morning to get yourself going after a heavy drinking session?: Never 7. How often during the last year have you had a feeling of guilt of remorse after drinking?: Less than monthly 8. How often during the last  year have you been unable to remember what happened the night before because you had been drinking?: Never 9. Have you or someone else been injured as a result of your drinking?: No 10. Has a relative or friend or a doctor or another health worker been concerned about your drinking or suggested you cut down?: No Alcohol Use Disorder Identification Test Final Score (AUDIT): 4 Brief Intervention: AUDIT score less than 7 or less-screening does not suggest unhealthy drinking-brief intervention not indicated  Allergies:   Allergies  Allergen Reactions  . Pregabalin Anaphylaxis  . Celebrex [Celecoxib] Nausea And Vomiting  . Effexor [Venlafaxine Hcl]     Serotonin   . Prozac [Fluoxetine Hcl] Swelling  . Symbyax [Olanzapine-Fluoxetine Hcl] Swelling  . Wellbutrin [Bupropion Hcl]    Lab Results:  Results for orders placed or performed during the hospital encounter of 04/14/15 (from the past 48 hour(s))  TSH     Status: None   Collection Time: 04/15/15  6:50 AM  Result Value Ref Range   TSH 1.907 0.350 - 4.500 uIU/mL    Comment: Performed at [facility]  Lipid panel, fasting     Status: Abnormal   Collection Time: 04/15/15  6:50 AM  Result Value Ref Range   Cholesterol 247 (H) 0 - 200 mg/dL   Triglycerides 179 (H) <150 mg/dL   HDL 37 (L) >40 mg/dL   Total CHOL/HDL Ratio 6.7 RATIO   VLDL 36 0 - 40 mg/dL   LDL Cholesterol 174 (H) 0 - 99 mg/dL    Comment:        Total Cholesterol/HDL:CHD Risk Coronary Heart Disease Risk Table                     Men   Women  1/2 Average Risk   3.4   3.3  Average Risk       5.0   4.4  2 X Average Risk   9.6   7.1  3 X Average Risk  23.4   11.0        Use the calculated Patient Ratio above and the CHD Risk Table to determine the patient's CHD Risk.  ATP III CLASSIFICATION (LDL):  <100     mg/dL   Optimal  100-129  mg/dL   Near or Above                    Optimal  130-159  mg/dL   Borderline  160-189  mg/dL   High  >190      mg/dL   Very High Performed at [facility]    Current Medications: Current Facility-Administered Medications  Medication Dose Route Frequency Provider Last Rate Last Dose  . acetaminophen (TYLENOL) tablet 650 mg  650 mg Oral Q6H PRN Laverle Hobby, PA-C      . alum & mag hydroxide-simeth (MAALOX/MYLANTA) 200-200-20 MG/5ML suspension 30 mL  30 mL Oral Q4H PRN Laverle Hobby, PA-C      . aspirin tablet 325 mg  325 mg Oral Daily Laverle Hobby, PA-C   325 mg at 04/15/15 0830  . busPIRone (BUSPAR) tablet 7.5 mg  7.5 mg Oral BID Laverle Hobby, PA-C   7.5 mg at 04/15/15 0626  . cloNIDine (CATAPRES) tablet 0.1 mg  0.1 mg Oral QID Laverle Hobby, PA-C   0.1 mg at 04/15/15 9485   Followed by  . [START ON 04/17/2015] cloNIDine (CATAPRES) tablet 0.1 mg  0.1 mg Oral BH-qamhs Spencer E Simon, PA-C       Followed by  . [START ON 04/20/2015] cloNIDine (CATAPRES) tablet 0.1 mg  0.1 mg Oral QAC breakfast Laverle Hobby, PA-C      . dicyclomine (BENTYL) tablet 20 mg  20 mg Oral Q6H PRN Laverle Hobby, PA-C      . DULoxetine (CYMBALTA) DR capsule 120 mg  120 mg Oral Daily Nicholaus Bloom, MD      . gabapentin (NEURONTIN) capsule 900 mg  900 mg Oral QHS Laverle Hobby, PA-C   900 mg at 04/14/15 2352  . hydrOXYzine (ATARAX/VISTARIL) tablet 25 mg  25 mg Oral Q6H PRN Laverle Hobby, PA-C   25 mg at 04/15/15 0830  . levothyroxine (SYNTHROID, LEVOTHROID) tablet 50 mcg  50 mcg Oral QAC breakfast Laverle Hobby, PA-C   50 mcg at 04/15/15 4627  . loperamide (IMODIUM) capsule 2-4 mg  2-4 mg Oral PRN Laverle Hobby, PA-C   4 mg at 04/15/15 0350  . magnesium hydroxide (MILK OF MAGNESIA) suspension 30 mL  30 mL Oral Daily PRN Laverle Hobby, PA-C      . methocarbamol (ROBAXIN) tablet 500 mg  500 mg Oral Q8H PRN Laverle Hobby, PA-C      . nicotine (NICODERM CQ - dosed in mg/24 hours) patch 21 mg  21 mg Transdermal Daily Laverle Hobby, PA-C   21 mg at 04/15/15 0829  . ondansetron (ZOFRAN-ODT)  disintegrating tablet 4 mg  4 mg Oral Q6H PRN Laverle Hobby, PA-C      . pravastatin (PRAVACHOL) tablet 20 mg  20 mg Oral Daily Laverle Hobby, PA-C   20 mg at 04/15/15 0834  . traZODone (DESYREL) tablet 50 mg  50 mg Oral QHS,MR X 1 Spencer E Simon, PA-C       PTA Medications: Prescriptions prior to admission  Medication Sig Dispense Refill Last Dose  . aspirin 325 MG tablet Take 325 mg by mouth every 4 (four) hours as needed for pain.   Past Month at Unknown time  . busPIRone (BUSPAR) 7.5 MG tablet Take 7.5 mg by mouth 2 (two) times daily.  04/13/2015 at Unknown time  . DULoxetine (CYMBALTA) 60 MG capsule take 1 capsule by mouth twice a day   04/13/2015 at Unknown time  . gabapentin (NEURONTIN) 300 MG capsule Take 900 mg by mouth at bedtime.   0 04/12/2015 at unknown time  . levothyroxine (SYNTHROID, LEVOTHROID) 50 MCG tablet Take 50 mcg by mouth daily.     04/13/2015 at Unknown time  . pravastatin (PRAVACHOL) 20 MG tablet Take 20 mg by mouth daily.   0 unknown at unknown time    Previous Psychotropic Medications: Yes   Substance Abuse History in the last 12 months:  Yes.    Consequences of Substance Abuse: NA  Results for orders placed or performed during the hospital encounter of 04/14/15 (from the past 72 hour(s))  TSH     Status: None   Collection Time: 04/15/15  6:50 AM  Result Value Ref Range   TSH 1.907 0.350 - 4.500 uIU/mL    Comment: Performed at [facility]  Lipid panel, fasting     Status: Abnormal   Collection Time: 04/15/15  6:50 AM  Result Value Ref Range   Cholesterol 247 (H) 0 - 200 mg/dL   Triglycerides 179 (H) <150 mg/dL   HDL 37 (L) >40 mg/dL   Total CHOL/HDL Ratio 6.7 RATIO   VLDL 36 0 - 40 mg/dL   LDL Cholesterol 174 (H) 0 - 99 mg/dL    Comment:        Total Cholesterol/HDL:CHD Risk Coronary Heart Disease Risk Table                     Men   Women  1/2 Average Risk   3.4   3.3  Average Risk       5.0   4.4  2 X Average Risk   9.6    7.1  3 X Average Risk  23.4   11.0        Use the calculated Patient Ratio above and the CHD Risk Table to determine the patient's CHD Risk.        ATP III CLASSIFICATION (LDL):  <100     mg/dL   Optimal  100-129  mg/dL   Near or Above                    Optimal  130-159  mg/dL   Borderline  160-189  mg/dL   High  >190     mg/dL   Very High Performed at [facility]     Observation Level/Precautions:  15 minute checks  Laboratory:  per ED  Psychotherapy:  Group   Medications:  As per medlist  Consultations:  As needed  Discharge Concerns:  Safety  Estimated LOS:  2-7 days  Other:     Psychological Evaluations: Yes   Treatment Plan Summary: Admit for crisis management and mood stabilization. Medication management to re-stabilize current mood:  Keep on Cymbalta 120 mg daily and Abilify 2 mg QHS Group counseling sessions for coping skills Medical consults as needed Review and reinstate any pertinent home medications for other health problems  Medical Decision Making:  Review of Psycho-Social Stressors (1), Discuss test with performing physician (1), Decision to obtain old records (1), Review and summation of old records (2), Review of Medication Regimen & Side Effects (2) and Review of New Medication or Change in Dosage (2)  I certify that inpatient services furnished can reasonably be expected to improve the patient's condition.  Freda Munro May Agustin AGNP-BC 8/10/201611:20 AM I personally assessed the patient, reviewed the physical exam and labs and formulated the treatment plan Geralyn Flash A. Sabra Heck, M.D.

## 2015-04-15 NOTE — BHH Suicide Risk Assessment (Signed)
[facility] Admission Suicide Risk Assessment   Nursing information obtained from:  Patient Demographic factors:  Caucasian Current Mental Status:  Self-harm thoughts Loss Factors:    Historical Factors:    Risk Reduction Factors:  Living with another person, especially a relative Total Time spent with patient: 45 minutes Principal Problem: <principal problem not specified> Diagnosis:   Patient Active Problem List   Diagnosis Date Noted  . Severe recurrent major depression w/psychotic features, mood-congruent [F33.3] 04/14/2015  . Anxiety disorder [F41.9] 04/14/2015  . Major depressive disorder, recurrent severe without psychotic features [F33.2] 04/14/2015  . Suicidal ideations [R45.851]   . Chronic LBP [M54.5, G89.29] 01/02/2015  . Adult hypothyroidism [E03.9] 06/25/2012     Continued Clinical Symptoms:  Alcohol Use Disorder Identification Test Final Score (AUDIT): 4 The "Alcohol Use Disorders Identification Test", Guidelines for Use in Primary Care, Second Edition.  World Pharmacologist [facility]). Score between 0-7:  no or low risk or alcohol related problems. Score between 8-15:  moderate risk of alcohol related problems. Score between 16-19:  high risk of alcohol related problems. Score 20 or above:  warrants further diagnostic evaluation for alcohol dependence and treatment.   CLINICAL FACTORS:   Bipolar Disorder:   Depressive phase   Musculoskeletal: Strength & Muscle Tone: within normal limits Gait & Station: normal Patient leans: normal  Psychiatric Specialty Exam: Physical Exam  Review of Systems  Constitutional: Negative.   Musculoskeletal: Positive for back pain, joint pain and neck pain.  Skin: Negative.   Psychiatric/Behavioral: Positive for depression.    Blood pressure 110/71, pulse 71, temperature 98.7 F (37.1 C), temperature source Oral, resp. rate 18, height 5\' 4"  (1.626 m), weight 90.266 kg (199 lb), last menstrual period 03/30/2015, SpO2 100 %.Body  mass index is 34.14 kg/(m^2).  General Appearance: Fairly Groomed  Engineer, water::  Fair  Speech:  Clear and Coherent  Volume:  fluctuates  Mood:  Anxious and Depressed  Affect:  Depressed and Tearful  Thought Process:  Coherent and Goal Directed  Orientation:  Full (Time, Place, and Person)  Thought Content:  symptoms events worries concerns  Suicidal Thoughts:  No  Homicidal Thoughts:  No  Memory:  Immediate;   Fair Recent;   Fair Remote;   Fair  Judgement:  Fair  Insight:  Present  Psychomotor Activity:  Restlessness  Concentration:  Fair  Recall:  [employer] of Knowledge:Fair  Language: Fair  Akathisia:  No  Handed:  Right  AIMS (if indicated):     Assets:  Desire for Improvement Housing Social Support  Sleep:  Number of Hours: 4.25  Cognition: WNL  ADL's:  Intact     COGNITIVE FEATURES THAT CONTRIBUTE TO RISK:  Closed-mindedness, Polarized thinking and Thought constriction (tunnel vision)    SUICIDE RISK:   Mild:  Suicidal ideation of limited frequency, intensity, duration, and specificity.  There are no identifiable plans, no associated intent, mild dysphoria and related symptoms, good self-control (both objective and subjective assessment), few other risk factors, and identifiable protective factors, including available and accessible social support.  PLAN OF CARE:  44 Y/o female with a long standing history of depression and chronic pain.  She was 44 Y/O started having depression. Moved from [place] to [place]. States she grew up with a sarcastic step father and she was the "sensitive kid" states in college she had a lot of social anxiety. States that now she does not get out of the house unless absolutely necessary. She has been dealing with chronic pain,  she has been overusing the opioids due to tolerance. States she stays in pain. Her depression is worst during the winter months, she has had post partum depression with her two pregnancies  with thoughts of wanting to hurt her  kids. She does not know much about his biological father other than [place] Does not know much about that side of the family. She does know that in her mother's side she has cousins who are mentally ill.  She has also have very negative responses to antidepressants. With the above presentation we need to R/O Bipolar Depression. Supportive approach/coping skills Depression; will continue the Cymbalta at 120 mg ( she has withdrawal even if she takes it but not at the same time) she has not have adverse side effects to it. Will go ahead and start Abilify although considering Latuda as an option Will also start Lamictal 25 mg daily ( skin rash discussed) Pain management; she is willing to be off opioids and start all over again with her pain management clinic Medical Decision Making:  Review of Psycho-Social Stressors (1), Review or order clinical lab tests (1), Review of Medication Regimen & Side Effects (2) and Review of New Medication or Change in Dosage (2)  I certify that inpatient services furnished can reasonably be expected to improve the patient's condition.   [place]in[place] A 04/15/2015, 2:33 PM

## 2015-04-15 NOTE — Progress Notes (Signed)
This is a 44 years old Caucasian female admitted to the unit this evening for Opiate detox. Patient reported that he had history of several back surgeries including 4 rods on her lower back. She said she has been taking about 150 mg oxycodone daily for years. She stated; " I'm tired of this and I just want to get off". She has passive SI but verbally contracted for safety. Skin assessment within normal limit. Writer encouraged and supported patient. Q 15 minute check continues as ordered for safety.

## 2015-04-15 NOTE — BHH Group Notes (Signed)
   [facility] LCSW Aftercare Discharge Planning Group Note  04/15/2015  8:45 AM   Participation Quality: Alert, Appropriate and Oriented  Mood/Affect: Depressed and Flat; tearful  Depression Rating: 10  Anxiety Rating: 10  Thoughts of Suicide: Pt denies SI/HI  Will you contract for safety? Yes  Current AVH: Pt denies  Plan for Discharge/Comments: Pt attended discharge planning group and actively participated in group. CSW provided pt with today's workbook. Patient became tearful when asked how she was doing today. She reports that she wants to get better and get off opiates. She plans to return home to follow up with outpatient services at discharge.  Transportation Means: Pt reports access to transportation  Supports: No supports mentioned at this time  Tilden Fossa, MSW, [place] Social Worker [employer] [phone]

## 2015-04-15 NOTE — Progress Notes (Signed)
Pt attended NA speaker meeting and was engaged and attentive.
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
## 2015-04-16 LAB — T4, FREE: Free T4: 1 ng/dL (ref 0.61–1.12)

## 2015-04-16 LAB — GABAPENTIN LEVEL: Gabapentin Lvl: 5.4 ug/mL

## 2015-04-16 MED ORDER — KETOROLAC TROMETHAMINE 30 MG/ML IJ SOLN
30.0000 mg | Freq: Once | INTRAMUSCULAR | Status: AC
  Administered 2015-04-16: 30 mg via INTRAMUSCULAR
  Filled 2015-04-16 (×2): qty 1

## 2015-04-16 MED ORDER — GABAPENTIN 300 MG PO CAPS
300.0000 mg | ORAL_CAPSULE | Freq: Three times a day (TID) | ORAL | Status: DC
  Administered 2015-04-16 – 2015-04-17 (×3): 300 mg via ORAL
  Filled 2015-04-16 (×4): qty 1
  Filled 2015-04-16 (×2): qty 42
  Filled 2015-04-16: qty 1
  Filled 2015-04-16: qty 42

## 2015-04-16 MED ORDER — METHOCARBAMOL 750 MG PO TABS
750.0000 mg | ORAL_TABLET | Freq: Three times a day (TID) | ORAL | Status: DC
  Administered 2015-04-16 – 2015-04-17 (×3): 750 mg via ORAL
  Filled 2015-04-16 (×8): qty 1

## 2015-04-16 MED ORDER — HYDROXYZINE HCL 50 MG PO TABS
50.0000 mg | ORAL_TABLET | Freq: Four times a day (QID) | ORAL | Status: DC | PRN
  Administered 2015-04-17: 50 mg via ORAL
  Filled 2015-04-16: qty 1

## 2015-04-16 MED ORDER — LIDOCAINE 5 % EX PTCH
3.0000 | MEDICATED_PATCH | CUTANEOUS | Status: DC
  Administered 2015-04-16: 3 via TRANSDERMAL
  Filled 2015-04-16 (×6): qty 3

## 2015-04-16 MED ORDER — KETOROLAC TROMETHAMINE 10 MG PO TABS
10.0000 mg | ORAL_TABLET | Freq: Four times a day (QID) | ORAL | Status: DC
  Administered 2015-04-16 – 2015-04-17 (×4): 10 mg via ORAL
  Filled 2015-04-16 (×12): qty 1

## 2015-04-16 NOTE — Progress Notes (Signed)
Patient attended [place] group and participated.

## 2015-04-16 NOTE — Progress Notes (Signed)
[facility] MD Progress Note  04/16/2015 8:49 PM [name]  MRN:  [id number] Subjective: [name] states she really wants her life be better does not want to give up. States she has accepted she is not going to be on opioids. She wants to have other options to manage her pain. She has tolerated the Lamictal and the Abilify so far,. Understands she is not going to see much benefit from these medications right now but she is hopeful Principal Problem: Major depressive disorder, recurrent severe without psychotic features Diagnosis:   Patient Active Problem List   Diagnosis Date Noted  . GAD (generalized anxiety disorder) [F41.1] 04/15/2015  . Bipolar depression [F31.30] 04/15/2015  . Severe recurrent major depression w/psychotic features, mood-congruent [F33.3] 04/14/2015  . Anxiety disorder [F41.9] 04/14/2015  . Major depressive disorder, recurrent severe without psychotic features [F33.2] 04/14/2015  . Suicidal ideations [R45.851]   . Chronic LBP [M54.5, G89.29] 01/02/2015  . Adult hypothyroidism [E03.9] 06/25/2012   Total Time spent with patient: 30 minutes   Past Medical History:  Past Medical History  Diagnosis Date  . Depression   . Thyroid disease   . Rosacea   . Chronic back pain greater than 3 months duration   . Dysuria     has to have self in and out cath     Past Surgical History  Procedure Laterality Date  . Back surgery    . Eye surgery    . Incontinence surgery    . Spinal fusion  2008  . Bladder mesh  2009   Family History: History reviewed. No pertinent family history. Social History:  History  Alcohol Use  . Yes    Comment: pt states "When I run out of medicine"     History  Drug Use No    Social History   Social History  . Marital Status: Legally Separated    Spouse Name: N/A  . Number of Children: N/A  . Years of Education: N/A   Social History Main Topics  . Smoking status: Current Every Day Smoker -- 1.00 packs/day    Types: Cigarettes  .  Smokeless tobacco: Never Used  . Alcohol Use: Yes     Comment: pt states "When I run out of medicine"  . Drug Use: No  . Sexual Activity: Not Asked   Other Topics Concern  . None   Social History Narrative   Additional History:    Sleep: Fair  Appetite:  Fair   Assessment:   Musculoskeletal: Strength & Muscle Tone: within normal limits Gait & Station: normal Patient leans: normal   Psychiatric Specialty Exam: Physical Exam  Review of Systems  Constitutional: Negative.   HENT: Negative.   Eyes: Negative.   Respiratory: Negative.   Cardiovascular: Negative.   Gastrointestinal: Negative.   Genitourinary: Negative.   Musculoskeletal: Positive for back pain.  Skin: Negative.   Neurological: Negative.   Endo/Heme/Allergies: Negative.   Psychiatric/Behavioral: Positive for depression. The patient is nervous/anxious.     Blood pressure 104/68, pulse 94, temperature 98.6 F (37 C), temperature source Oral, resp. rate 16, height 5\' 4"  (1.626 m), weight 90.266 kg (199 lb), last menstrual period 03/30/2015, SpO2 100 %.Body mass index is 34.14 kg/(m^2).  General Appearance: Fairly Groomed  Engineer, water::  Fair  Speech:  Clear and Coherent  Volume:  Normal  Mood:  Anxious and worried in pain  Affect:  Labile  Thought Process:  Coherent and Goal Directed  Orientation:  Full (Time, Place, and Person)  Thought Content:  symptoms events worries concerns  Suicidal Thoughts:  No  Homicidal Thoughts:  No  Memory:  Immediate;   Fair Recent;   Fair Remote;   Fair  Judgement:  Fair  Insight:  Present  Psychomotor Activity:  Restlessness  Concentration:  Fair  Recall:  [employer] of Knowledge:Fair  Language: Fair  Akathisia:  No  Handed:  Right  AIMS (if indicated):     Assets:  Desire for Improvement  ADL's:  Intact  Cognition: WNL  Sleep:  Number of Hours: 5     Current Medications: Current Facility-Administered Medications  Medication Dose Route Frequency  Provider Last Rate Last Dose  . acetaminophen (TYLENOL) tablet 650 mg  650 mg Oral Q6H PRN Laverle Hobby, PA-C      . alum & mag hydroxide-simeth (MAALOX/MYLANTA) 200-200-20 MG/5ML suspension 30 mL  30 mL Oral Q4H PRN Laverle Hobby, PA-C   30 mL at 04/15/15 1542  . ARIPiprazole (ABILIFY) tablet 2 mg  2 mg Oral Daily Kerrie Buffalo, NP   2 mg at 04/16/15 0816  . aspirin tablet 325 mg  325 mg Oral Daily Laverle Hobby, PA-C   325 mg at 04/16/15 0816  . busPIRone (BUSPAR) tablet 7.5 mg  7.5 mg Oral BID Laverle Hobby, PA-C   7.5 mg at 04/16/15 1713  . cloNIDine (CATAPRES) tablet 0.1 mg  0.1 mg Oral QID Laverle Hobby, PA-C   0.1 mg at 04/16/15 1713   Followed by  . [START ON 04/17/2015] cloNIDine (CATAPRES) tablet 0.1 mg  0.1 mg Oral BH-qamhs Spencer E Simon, PA-C       Followed by  . [START ON 04/20/2015] cloNIDine (CATAPRES) tablet 0.1 mg  0.1 mg Oral QAC breakfast Laverle Hobby, PA-C      . dicyclomine (BENTYL) tablet 20 mg  20 mg Oral Q6H PRN Laverle Hobby, PA-C      . DULoxetine (CYMBALTA) DR capsule 120 mg  120 mg Oral Daily Nicholaus Bloom, MD   120 mg at 04/16/15 0816  . gabapentin (NEURONTIN) capsule 300 mg  300 mg Oral TID Nicholaus Bloom, MD   300 mg at 04/16/15 1713  . gabapentin (NEURONTIN) capsule 900 mg  900 mg Oral QHS Laverle Hobby, PA-C   900 mg at 04/15/15 2139  . hydrOXYzine (ATARAX/VISTARIL) tablet 50 mg  50 mg Oral Q6H PRN Nicholaus Bloom, MD      . ketorolac (TORADOL) tablet 10 mg  10 mg Oral 4 times per day Nicholaus Bloom, MD   10 mg at 04/16/15 1712  . lamoTRIgine (LAMICTAL) tablet 25 mg  25 mg Oral Daily Nicholaus Bloom, MD   25 mg at 04/16/15 0817  . levothyroxine (SYNTHROID, LEVOTHROID) tablet 50 mcg  50 mcg Oral QAC breakfast Laverle Hobby, PA-C   50 mcg at 04/16/15 5053  . lidocaine (LIDODERM) 5 % 3 patch  3 patch Transdermal Q24H Nicholaus Bloom, MD   3 patch at 04/16/15 1800  . loperamide (IMODIUM) capsule 2-4 mg  2-4 mg Oral PRN Laverle Hobby, PA-C   4 mg at  04/15/15 9767  . loratadine (CLARITIN) tablet 10 mg  10 mg Oral Daily PRN Nicholaus Bloom, MD   10 mg at 04/16/15 1453  . LORazepam (ATIVAN) tablet 1 mg  1 mg Oral Q6H PRN Nicholaus Bloom, MD   1 mg at 04/16/15 1453  . magnesium hydroxide (MILK OF MAGNESIA) suspension  30 mL  30 mL Oral Daily PRN Laverle Hobby, PA-C      . methocarbamol (ROBAXIN) tablet 750 mg  750 mg Oral TID Nicholaus Bloom, MD   750 mg at 04/16/15 1713  . nicotine (NICODERM CQ - dosed in mg/24 hours) patch 21 mg  21 mg Transdermal Daily Laverle Hobby, PA-C   21 mg at 04/16/15 0818  . ondansetron (ZOFRAN-ODT) disintegrating tablet 4 mg  4 mg Oral Q6H PRN Laverle Hobby, PA-C      . pantoprazole (PROTONIX) EC tablet 20 mg  20 mg Oral Daily Nicholaus Bloom, MD   20 mg at 04/16/15 0817  . pravastatin (PRAVACHOL) tablet 20 mg  20 mg Oral Daily Laverle Hobby, PA-C   20 mg at 04/16/15 0815  . traZODone (DESYREL) tablet 50 mg  50 mg Oral QHS,MR X 1 Laverle Hobby, PA-C   50 mg at 04/15/15 2139    Lab Results:  Results for orders placed or performed during the hospital encounter of 04/14/15 (from the past 48 hour(s))  TSH     Status: None   Collection Time: 04/15/15  6:50 AM  Result Value Ref Range   TSH 1.907 0.350 - 4.500 uIU/mL    Comment: Performed at [facility]  Lipid panel, fasting     Status: Abnormal   Collection Time: 04/15/15  6:50 AM  Result Value Ref Range   Cholesterol 247 (H) 0 - 200 mg/dL   Triglycerides 179 (H) <150 mg/dL   HDL 37 (L) >40 mg/dL   Total CHOL/HDL Ratio 6.7 RATIO   VLDL 36 0 - 40 mg/dL   LDL Cholesterol 174 (H) 0 - 99 mg/dL    Comment:        Total Cholesterol/HDL:CHD Risk Coronary Heart Disease Risk Table                     Men   Women  1/2 Average Risk   3.4   3.3  Average Risk       5.0   4.4  2 X Average Risk   9.6   7.1  3 X Average Risk  23.4   11.0        Use the calculated Patient Ratio above and the CHD Risk Table to determine the patient's CHD Risk.         ATP III CLASSIFICATION (LDL):  <100     mg/dL   Optimal  100-129  mg/dL   Near or Above                    Optimal  130-159  mg/dL   Borderline  160-189  mg/dL   High  >190     mg/dL   Very High Performed at [facility]   T4, free     Status: None   Collection Time: 04/16/15  6:24 AM  Result Value Ref Range   Free T4 1.00 0.61 - 1.12 ng/dL    Comment: Performed at [facility]    Physical Findings: AIMS: Facial and Oral Movements Muscles of Facial Expression: None, normal Lips and Perioral Area: None, normal Jaw: None, normal Tongue: None, normal,Extremity Movements Upper (arms, wrists, hands, fingers): None, normal Lower (legs, knees, ankles, toes): None, normal, Trunk Movements Neck, shoulders, hips: None, normal, Overall Severity Severity of abnormal movements (highest score from questions above): None, normal Incapacitation due to abnormal movements: None, normal Patient's awareness  of abnormal movements (rate only patient's report): No Awareness, Dental Status Current problems with teeth and/or dentures?: No Does patient usually wear dentures?: No  CIWA:  CIWA-Ar Total: 0 COWS:  COWS Total Score: 1  Treatment Plan Summary: Daily contact with patient to assess and evaluate symptoms and progress in treatment and Medication management Supportive approach/coping skills Mood instability; continue the Abilify and the Lamictal Pain; will change the Robaxin to 750 mg TID Will increase the Neurontin to 300 mg TID and keep the 900 mg at night Will use Nicoderm patches in her back Will work with CBT/mindfulness Medical Decision Making:  Review of Psycho-Social Stressors (1) and Review of Medication Regimen & Side Effects (2)     Ishmel Acevedo A 04/16/2015, 8:49 PM

## 2015-04-16 NOTE — BHH Suicide Risk Assessment (Signed)
[place] INPATIENT:  Family/Significant Other Suicide Prevention Education  Suicide Prevention Education:  Contact Attempts: Fiance Ginger Organ [phone], (name of family member/significant other) has been identified by the patient as the family member/significant other with whom the patient will be residing, and identified as the person(s) who will aid the patient in the event of a mental health crisis.  With written consent from the patient, two attempts were made to provide suicide prevention education, prior to and/or following the patient's discharge.  We were unsuccessful in providing suicide prevention education.  A suicide education pamphlet was given to the patient to share with family/significant other.  Date and time of first attempt: 04/16/15 at 3:25pm Date and time of second attempt: 04/17/15 at 11:18am  Ananth Fiallos, Casimiro Needle 04/16/2015, 3:28 PM

## 2015-04-16 NOTE — Progress Notes (Signed)
[name] has been uncomfortable  And anxious all day. She says this is pretty much ehr norm. She has received many prns today, for anxiety and pain. She has attended her groups and she has allowed this nurse to process with her.    A She is anxious, tearful and childlike, at times. She has difficulty idnetifying her needs and how to aske to get them met. She completed her daily assessment and on it she wrote she deneid SI and she rated her depression , anxiety and hopelessness  " 9/10/6"., respectively.   R Safety is in place and poc cont.

## 2015-04-16 NOTE — Progress Notes (Signed)
Pt is alert and oriented x4. Pt with several crying spells during conversation with nurse endorses severe anxiety and depression. She states, "Both my anxiety and depression are at a 10 right now. I just spoke to my kids just now and they want me to come home." Pt also verbalizing have good support system; she states, "I have a lot of people that love and care about me; I just don't understand why she keep feeling this way (depressed and anxious); I know I have to get better for my kids." Pt verbalized mild back pain of 2 on a 0-10 pain scale; however, denies SI/HI/VAH. Pt continues to be pleasant and cooperative. A: Medications administered as prescribed.  Pt attended the NA group. Support, encouragement, and safe environment provided.  15-minute safety checks continue. R: Pt was med compliant.  Safety checks continue

## 2015-04-16 NOTE — Progress Notes (Signed)
D Pt. Denies SI and HI, no complaints of pain and discomfort noted at present time.  A Writer offered support and encouragement, discussed discharge plans with pt.  R Pt. Reports she is planning discharge tomorrow.  Pt. Rates her depression at a 0,, but reports her anxiety can still be high at times but cannot tell writer what her triggers are.  Pt. Remains safe on the unit.

## 2015-04-16 NOTE — Clinical Social Work Note (Signed)
IOP referral made at MD's request.  Tilden Fossa, MSW, [place] Worker [facility] [phone]

## 2015-04-16 NOTE — BHH Group Notes (Signed)
[place] LCSW Group Therapy 04/16/2015 1:15 PM Type of Therapy: Group Therapy Participation Level: Active  Participation Quality: Attentive, Sharing and Supportive  Affect: Depressed and Flat  Cognitive: Alert and Oriented  Insight: Developing/Improving and Engaged  Engagement in Therapy: Developing/Improving and Engaged  Modes of Intervention: Activity, Clarification, Confrontation, Discussion, Education, Exploration, Limit-setting, Orientation, Problem-solving, Rapport Building, Art therapist, Socialization and Support  Summary of Progress/Problems: Patient was attentive and engaged with speaker from [place]. Patient was attentive to speaker while they shared their story of dealing with mental health and overcoming it. Patient expressed interest in their programs and services and received information on their agency. Patient processed ways they can relate to the speaker.   Tilden Fossa, MSW, [place] Worker [facility] [phone]

## 2015-04-16 NOTE — BHH Group Notes (Signed)
LATE ENTRY FROM 8/10:  [place] LCSW Group Therapy 04/15/15  1:15 PM Type of Therapy: Group Therapy Participation Level: Active  Participation Quality: Attentive, Sharing and Supportive  Affect: Depressed and Flat, tearful  Cognitive: Alert and Oriented  Insight: Developing/Improving and Engaged  Engagement in Therapy: Developing/Improving and Engaged  Modes of Intervention: Clarification, Confrontation, Discussion, Education, Exploration, Limit-setting, Orientation, Problem-solving, Rapport Building, Art therapist, Socialization and Support  Summary of Progress/Problems: The topic for group today was emotional regulation. This group focused on both positive and negative emotion identification and allowed group members to process ways to identify feelings, regulate negative emotions, and find healthy ways to manage internal/external emotions. Group members were asked to reflect on a time when their reaction to an emotion led to a negative outcome and explored how alternative responses using emotion regulation would have benefited them. Group members were also asked to discuss a time when emotion regulation was utilized when a negative emotion was experienced. Patient engaged in discussion of emotional triggers and healthy coping skills. CSW reviewed cognitive triangle with group.   Tilden Fossa, MSW, [place] Worker [facility] [phone]

## 2015-04-17 DIAGNOSIS — F332 Major depressive disorder, recurrent severe without psychotic features: Secondary | ICD-10-CM | POA: Diagnosis present

## 2015-04-17 LAB — PROLACTIN: Prolactin: 8.4 ng/mL (ref 4.8–23.3)

## 2015-04-17 MED ORDER — ASPIRIN 325 MG PO TABS: 325.0000 mg | ORAL_TABLET | Freq: Every day | ORAL | Status: DC

## 2015-04-17 MED ORDER — NICOTINE 21 MG/24HR TD PT24: 21.0000 mg | MEDICATED_PATCH | Freq: Every day | TRANSDERMAL | Status: DC

## 2015-04-17 MED ORDER — HYDROXYZINE HCL 50 MG PO TABS: 50.0000 mg | ORAL_TABLET | Freq: Four times a day (QID) | ORAL | Status: DC | PRN

## 2015-04-17 MED ORDER — LEVOTHYROXINE SODIUM 50 MCG PO TABS: 50.0000 ug | ORAL_TABLET | Freq: Every day | ORAL | Status: DC

## 2015-04-17 MED ORDER — BUSPIRONE HCL 7.5 MG PO TABS: 7.5000 mg | ORAL_TABLET | Freq: Two times a day (BID) | ORAL | Status: DC

## 2015-04-17 MED ORDER — ARIPIPRAZOLE 2 MG PO TABS: 2.0000 mg | ORAL_TABLET | Freq: Every day | ORAL | Status: DC

## 2015-04-17 MED ORDER — METHOCARBAMOL 750 MG PO TABS: 750.0000 mg | ORAL_TABLET | Freq: Three times a day (TID) | ORAL | Status: DC | PRN

## 2015-04-17 MED ORDER — DULOXETINE HCL 60 MG PO CPEP: 120.0000 mg | ORAL_CAPSULE | Freq: Every day | ORAL | Status: DC

## 2015-04-17 MED ORDER — TRAZODONE HCL 50 MG PO TABS: 50.0000 mg | ORAL_TABLET | Freq: Every evening | ORAL | Status: DC | PRN

## 2015-04-17 MED ORDER — PANTOPRAZOLE SODIUM 20 MG PO TBEC: 20.0000 mg | DELAYED_RELEASE_TABLET | Freq: Every day | ORAL | Status: DC

## 2015-04-17 MED ORDER — GABAPENTIN 300 MG PO CAPS: 300.0000 mg | ORAL_CAPSULE | Freq: Three times a day (TID) | ORAL | Status: DC

## 2015-04-17 MED ORDER — LIDOCAINE 5 % EX PTCH: 3.0000 | MEDICATED_PATCH | CUTANEOUS | Status: DC

## 2015-04-17 MED ORDER — GABAPENTIN 300 MG PO CAPS: 900.0000 mg | ORAL_CAPSULE | Freq: Every day | ORAL | Status: DC

## 2015-04-17 MED ORDER — PRAVASTATIN SODIUM 20 MG PO TABS: 20.0000 mg | ORAL_TABLET | Freq: Every day | ORAL | Status: DC

## 2015-04-17 MED ORDER — LAMOTRIGINE 25 MG PO TABS: 25.0000 mg | ORAL_TABLET | Freq: Every day | ORAL | Status: DC

## 2015-04-17 NOTE — Progress Notes (Signed)
  [facility] Adult Case Management Discharge Plan :  Will you be returning to the same living situation after discharge:  Yes,  Patient plans to return home At discharge, do you have transportation home?: Yes,  patient reports access to transportation Do you have the ability to pay for your medications: Yes,  patient will be provided with prescriptions at discharge.  Release of information consent forms completed and in the chart;  Patient's signature needed at discharge.  Patient to Follow up at: Follow-up Information    Follow up with [facility] On 04/22/2015.   Why:  Start IOP with Dellia Nims on Wednesday August 17th at 8:45 am. Please bring insurance card and call office if you need to reschedule.   Contact information:   [address]  [place] [address] [phone]      Patient denies SI/HI: Yes,  denies    Safety Planning and Suicide Prevention discussed: Yes,  with patient   Have you used any form of tobacco in the last 30 days? (Cigarettes, Smokeless Tobacco, Cigars, and/or Pipes): Yes  Has patient been referred to the Quitline?: Patient refused referral  Merna Baldi, Casimiro Needle 04/17/2015, 11:16 AM

## 2015-04-17 NOTE — Clinical Social Work Note (Signed)
CSW provided patient with listing of mental health providers for after she completes IOP program.  Tilden Fossa, MSW, [place] Worker [facility] [phone]

## 2015-04-17 NOTE — BHH Group Notes (Addendum)
[place] Group Notes:  (Nursing/MHT/Case Management/Adjunct)  Date: 04/16/2015    LATE ENTRY Time:  0900  Type of Therapy:  Nurse Education : The group was focused on helping patients identify unhealthy behaviors .  Participation Level:  good  Participation Quality:  good  Affect: Anxious   Cognitive:  Intact  Insight:  Minimal  Engagement in Group:  Engaged  Modes of Intervention: discussion   Summary of Progress/Problems:  Lauralyn Primes 04/17/2015, 9:51 AM

## 2015-04-17 NOTE — BHH Group Notes (Signed)
   [facility] LCSW Aftercare Discharge Planning Group Note  04/17/2015  8:45 AM   Participation Quality: Alert, Appropriate and Oriented  Mood/Affect: Appropriate  Depression Rating: 0  Anxiety Rating: 0  Thoughts of Suicide: Pt denies SI/HI  Will you contract for safety? Yes  Current AVH: Pt denies  Plan for Discharge/Comments: Pt attended discharge planning group and actively participated in group. CSW provided pt with today's workbook. Patient reports feeling safe for discharge, plans to return home to follow up with [facility] IOP.  Transportation Means: Pt reports access to transportation  Supports: No supports mentioned at this time  Tilden Fossa, MSW, [place] Social Worker [employer] [phone]

## 2015-04-17 NOTE — Progress Notes (Signed)
Recreation Therapy Notes  Date: 08.12.16 Time: 9:30 am Location: [address] Group Room  Group Topic: Stress Management  Goal Area(s) Addresses:  Patient will verbalize importance of using healthy stress management.  Patient will identify positive emotions associated with healthy stress management.   Intervention: Stress Management  Activity :  Guided Automotive engineer.  LRT will introduce and educate the patients on the stress management technique of guided imagery.  A script was used to deliver the technique to the patients.  Patients were asked to follow the script read aloud by the LRT to engage in the stress management technique.  Education:  Stress Management, Discharge Planning.   Education Outcome: Acknowledges edcuation/In group clarification offered/Needs additional education  Clinical Observations/Feedback: Patient did not attend group.   Victorino Sparrow, LRT/CTRS         Victorino Sparrow A 04/17/2015 4:04 PM

## 2015-04-17 NOTE — Discharge Summary (Signed)
Physician Discharge Summary Note  Patient:  [name] is an 44 y.o., female MRN:  [id number] DOB:  March 18, 1971 Patient phone:  [phone] (home)  Patient address:   [place] [address],  Total Time spent with patient: 30 minutes  Date of Admission:  04/14/2015 Date of Discharge: 04/17/15  Reason for Admission:  Opiate detox, Mood stabilization  Principal Problem: Major depressive disorder, recurrent severe without psychotic features Discharge Diagnoses: Patient Active Problem List   Diagnosis Date Noted  . Recurrent major depression-severe [F33.2] 04/17/2015  . GAD (generalized anxiety disorder) [F41.1] 04/15/2015  . Bipolar depression [F31.30] 04/15/2015  . Severe recurrent major depression w/psychotic features, mood-congruent [F33.3] 04/14/2015  . Anxiety disorder [F41.9] 04/14/2015  . Major depressive disorder, recurrent severe without psychotic features [F33.2] 04/14/2015  . Suicidal ideations [R45.851]   . Chronic LBP [M54.5, G89.29] 01/02/2015  . Adult hypothyroidism [E03.9] 06/25/2012    Musculoskeletal: Strength & Muscle Tone: within normal limits Gait & Station: normal Patient leans: N/A  Psychiatric Specialty Exam: Physical Exam  ROS  Blood pressure 110/78, pulse 108, temperature 98.8 F (37.1 C), temperature source Oral, resp. rate 16, height 5\' 4"  (1.626 m), weight 90.266 kg (199 lb), last menstrual period 03/30/2015, SpO2 100 %.Body mass index is 34.14 kg/(m^2).  See Physician SRA     Have you used any form of tobacco in the last 30 days? (Cigarettes, Smokeless Tobacco, Cigars, and/or Pipes): Yes  Has this patient used any form of tobacco in the last 30 days? (Cigarettes, Smokeless Tobacco, Cigars, and/or Pipes) Yes, Prescription not provided because: nicotine patches given  Past Medical History:  Past Medical History  Diagnosis Date  . Depression   . Thyroid disease   . Rosacea   . Chronic back pain greater than 3 months  duration   . Dysuria     has to have self in and out cath     Past Surgical History  Procedure Laterality Date  . Back surgery    . Eye surgery    . Incontinence surgery    . Spinal fusion  2008  . Bladder mesh  2009   Family History: History reviewed. No pertinent family history. Social History:  History  Alcohol Use  . Yes    Comment: pt states "When I run out of medicine"     History  Drug Use No    Social History   Social History  . Marital Status: Legally Separated    Spouse Name: N/A  . Number of Children: N/A  . Years of Education: N/A   Social History Main Topics  . Smoking status: Current Every Day Smoker -- 1.00 packs/day    Types: Cigarettes  . Smokeless tobacco: Never Used  . Alcohol Use: Yes     Comment: pt states "When I run out of medicine"  . Drug Use: No  . Sexual Activity: Not Asked   Other Topics Concern  . None   Social History Narrative    Risk to Self: Is patient at risk for suicide?: Yes What has been your use of drugs/alcohol within the last 12 months?:  (denies) Risk to Others:   Prior Inpatient Therapy:   Prior Outpatient Therapy:    Level of Care:  OP  Hospital Course:    [name] is an 44 y.o. female, initially seen by TTS with suicidal ideations. She stated that she came in wanting to get help with opiates. She reported having has 6 back surgeries with  2 of them being spinal fusions/pins/rods. Since 2008 she has been on opiate control for the pain. She is an established patient of [facility]. She does report that she ran out of her Oxy 30 mg (she can take 4-6x/day) and will not be able to get any more until Sept. She states that she is tired of being dependent on opiates. She states finding herself more and more dependent to the point that it was all she ever thought about. She also felt that her depression worsened in the last year. She reports carrying a diagnosis of PMDD wherein certain times of the  month, she can hardly get out of bed. At other times, she would cry as soon as she woke up. She had been on Cymbalta 120 mg over the past year and states this helps her. She last saw a mental health health professional 3 mos ago in a [place] facility. Seen today, patient was very tearful. "I have a beautiful home, my BF is supportive of me, I want to be a better mom, I try to get put of bed to do errands but I don't want to leave out of the house."         Poetry L Jerry was admitted to the adult 300 unit where she was evaluated and her symptoms were identified. Medication management was discussed and implemented. The patient completed the clonidine protocol for the purposes of opiate detox. Her Cymbalta was continued at 120 mg daily for the treatment of depression and chronic pain. She was started on Neurontin to help with pain and for mood stabilization. Patient was also started on Abilify 2 mg daily to augment her treatment for depression. Patient requested vistaril on a prn basis for relief from acute anxiety. She was encouraged to participate in unit programming. Medical problems were identified and treated appropriately. Alternative ways to manage her pain were and explored and patient received scheduled toradol to help manage the symptoms. Home medication was restarted as needed.   She was evaluated each day by a clinical provider to ascertain the patient's response to treatment.  Improvement was noted by the patient's report of decreasing symptoms, improved sleep and appetite, affect, medication tolerance, behavior, and participation in unit programming.  The patient was asked each day to complete a self inventory noting mood, mental status, pain, new symptoms, anxiety and concerns.         She responded well to medication and being in a therapeutic and supportive environment. Positive and appropriate behavior was noted and the patient was motivated for recovery.  She worked closely with the  treatment team and case manager to develop a discharge plan with appropriate goals. Coping skills, problem solving as well as relaxation therapies were also part of the unit programming. Patient appeared very motivated to stop her use of opiates although she verbalized that it was very difficult. She was optimistic over giving other options a try.          By the day of discharge she was in much improved condition than upon admission.  Symptoms were reported as significantly decreased or resolved completely. The patient denied SI/HI and voiced no AVH. She was motivated to continue taking medication with a goal of continued improvement in mental health.  Jamiah L Curd was discharged home with a plan to follow up as noted below. The patient was provided with sample medications and prescriptions at time of discharge. She left [facility] in stable condition with all belongings returned  to her.   Consults:  psychiatry  Significant Diagnostic Studies:  Prolactin level, UDS positive for opiates, Lipid profile, Chemistry profile, CBC   Discharge Vitals:   Blood pressure 110/78, pulse 108, temperature 98.8 F (37.1 C), temperature source Oral, resp. rate 16, height 5\' 4"  (1.626 m), weight 90.266 kg (199 lb), last menstrual period 03/30/2015, SpO2 100 %. Body mass index is 34.14 kg/(m^2). Lab Results:   Results for orders placed or performed during the hospital encounter of 04/14/15 (from the past 72 hour(s))  TSH     Status: None   Collection Time: 04/15/15  6:50 AM  Result Value Ref Range   TSH 1.907 0.350 - 4.500 uIU/mL    Comment: Performed at [facility]  Lipid panel, fasting     Status: Abnormal   Collection Time: 04/15/15  6:50 AM  Result Value Ref Range   Cholesterol 247 (H) 0 - 200 mg/dL   Triglycerides 179 (H) <150 mg/dL   HDL 37 (L) >40 mg/dL   Total CHOL/HDL Ratio 6.7 RATIO   VLDL 36 0 - 40 mg/dL   LDL Cholesterol 174 (H) 0 - 99 mg/dL    Comment:        Total  Cholesterol/HDL:CHD Risk Coronary Heart Disease Risk Table                     Men   Women  1/2 Average Risk   3.4   3.3  Average Risk       5.0   4.4  2 X Average Risk   9.6   7.1  3 X Average Risk  23.4   11.0        Use the calculated Patient Ratio above and the CHD Risk Table to determine the patient's CHD Risk.        ATP III CLASSIFICATION (LDL):  <100     mg/dL   Optimal  100-129  mg/dL   Near or Above                    Optimal  130-159  mg/dL   Borderline  160-189  mg/dL   High  >190     mg/dL   Very High Performed at [facility]   Gabapentin level     Status: None   Collection Time: 04/15/15  6:50 AM  Result Value Ref Range   Gabapentin Lvl 5.4 mcg/mL    Comment: (NOTE) Reference ranges for Gabapentin: 2.7-4.1 mcg/mL (peak) following a single dose of [phone] mg/day. 4.0-8.5 mcg/mL (peak) following a multiple dose of [phone] mg/day administration. The reference range is evolving.  Seizure control has been observed at levels in excess of 4 mcg/mL. Performed at [facility]   Prolactin     Status: None   Collection Time: 04/16/15  6:24 AM  Result Value Ref Range   Prolactin 8.4 4.8 - 23.3 ng/mL    Comment: (NOTE) Performed At: [facility] [place], [place] [id number] Lindon Romp MD QH:[id number] Performed at [facility]   T4, free     Status: None   Collection Time: 04/16/15  6:24 AM  Result Value Ref Range   Free T4 1.00 0.61 - 1.12 ng/dL    Comment: Performed at [facility]    Physical Findings: AIMS: Facial and Oral Movements Muscles of Facial Expression: None, normal Lips and Perioral Area: None, normal Jaw: None, normal Tongue: None, normal,Extremity Movements  Upper (arms, wrists, hands, fingers): None, normal Lower (legs, knees, ankles, toes): None, normal, Trunk Movements Neck, shoulders, hips: None, normal, Overall Severity Severity of abnormal movements (highest score  from questions above): None, normal Incapacitation due to abnormal movements: None, normal Patient's awareness of abnormal movements (rate only patient's report): No Awareness, Dental Status Current problems with teeth and/or dentures?: No Does patient usually wear dentures?: No  CIWA:  CIWA-Ar Total: 0 COWS:  COWS Total Score: 0   See Psychiatric Specialty Exam and Suicide Risk Assessment completed by Attending Physician prior to discharge.  Discharge destination:  Home  Is patient on multiple antipsychotic therapies at discharge:  No   Has Patient had three or more failed trials of antipsychotic monotherapy by history:  No  Recommended Plan for Multiple Antipsychotic Therapies: NA      Discharge Instructions    Discharge instructions    Complete by:  As directed   Please follow up with your Primary Care Provider as scheduled for further treatment and refills of medical medications such as for chronic pain.            Medication List    TAKE these medications      Indication   ARIPiprazole 2 MG tablet  Commonly known as:  ABILIFY  Take 1 tablet (2 mg total) by mouth daily.   Indication:  Major Depressive Disorder, mood stabilization     aspirin 325 MG tablet  Take 1 tablet (325 mg total) by mouth daily.   Indication:  Inflammation     busPIRone 7.5 MG tablet  Commonly known as:  BUSPAR  Take 1 tablet (7.5 mg total) by mouth 2 (two) times daily.   Indication:  Depression     DULoxetine 60 MG capsule  Commonly known as:  CYMBALTA  Take 2 capsules (120 mg total) by mouth daily.   Indication:  Major Depressive Disorder     gabapentin 300 MG capsule  Commonly known as:  NEURONTIN  Take 3 capsules (900 mg total) by mouth at bedtime.   Indication:  Nerve Pain, Neurogenic Pain     gabapentin 300 MG capsule  Commonly known as:  NEURONTIN  Take 1 capsule (300 mg total) by mouth 3 (three) times daily.   Indication:  Nerve Pain, Neurogenic Pain     hydrOXYzine 50 MG  tablet  Commonly known as:  ATARAX/VISTARIL  Take 1 tablet (50 mg total) by mouth every 6 (six) hours as needed for anxiety.   Indication:  Anxiety Neurosis     lamoTRIgine 25 MG tablet  Commonly known as:  LAMICTAL  Take 1 tablet (25 mg total) by mouth daily.   Indication:  Depression     levothyroxine 50 MCG tablet  Commonly known as:  SYNTHROID, LEVOTHROID  Take 1 tablet (50 mcg total) by mouth daily.   Indication:  Underactive Thyroid     lidocaine 5 %  Commonly known as:  LIDODERM  Place 3 patches onto the skin daily. Remove & Discard patch within 12 hours or as directed by MD   Indication:  Allodynia     methocarbamol 750 MG tablet  Commonly known as:  ROBAXIN  Take 1 tablet (750 mg total) by mouth every 8 (eight) hours as needed for muscle spasms.   Indication:  Musculoskeletal Pain     nicotine 21 mg/24hr patch  Commonly known as:  NICODERM CQ - dosed in mg/24 hours  Place 1 patch (21 mg total) onto the skin daily.  Indication:  Nicotine Addiction     pantoprazole 20 MG tablet  Commonly known as:  PROTONIX  Take 1 tablet (20 mg total) by mouth daily.   Indication:  Gastroesophageal Reflux Disease     pravastatin 20 MG tablet  Commonly known as:  PRAVACHOL  Take 1 tablet (20 mg total) by mouth daily.   Indication:  Disease involving Cholesterol Deposits in the Arteries     traZODone 50 MG tablet  Commonly known as:  DESYREL  Take 1 tablet (50 mg total) by mouth at bedtime and may repeat dose one time if needed.   Indication:  Trouble Sleeping       Follow-up Information    Follow up with [facility] On 04/22/2015.   Why:  Start IOP with Dellia Nims on Wednesday August 17th at 8:45 am. Please bring insurance card and call office if you need to reschedule.   Contact information:   [address]  [place] [address] [phone]      Follow-up recommendations:   Activity: as above Diet: regular Follow up as above  Comments:   Take all  your medications as prescribed by your mental healthcare provider.  Report any adverse effects and or reactions from your medicines to your outpatient provider promptly.  Patient is instructed and cautioned to not engage in alcohol and or illegal drug use while on prescription medicines.  In the event of worsening symptoms, patient is instructed to call the crisis hotline, 911 and or go to the nearest ED for appropriate evaluation and treatment of symptoms.  Follow-up with your primary care provider for your other medical issues, concerns and or health care needs.   Total Discharge Time: Greater than 30 minutes  Signed: DAVIS, LAURA NP-C 04/17/2015, 7:11 PM  I personally assessed the patient and formulated the plan Geralyn Flash A. Sabra Heck, M.D.
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
## 2015-04-17 NOTE — Progress Notes (Signed)
Patient ID: [name], female   DOB: [birth year], 44 y.o.   MRN: [id number]  D:Patient at baseline of functioning. Has follow-up appointment scheduled. No SI/HI or a/v hallucinations.  A: Obtained all belongings, prescriptions, sample medications, and follow-up appointment. R: Family here to pick patient up

## 2015-04-17 NOTE — [facility] Suicide Risk Assessment (Signed)
[facility] Discharge Suicide Risk Assessment   Demographic Factors:  Caucasian  Total Time spent with patient: 30 minutes  Musculoskeletal: Strength & Muscle Tone: within normal limits Gait & Station: normal Patient leans: normal  Psychiatric Specialty Exam: Physical Exam  Review of Systems  Constitutional: Negative.   HENT: Negative.   Eyes: Negative.   Respiratory: Negative.   Cardiovascular: Negative.   Gastrointestinal: Negative.   Genitourinary: Negative.   Musculoskeletal: Positive for back pain and joint pain.  Skin: Negative.   Neurological: Negative.   Endo/Heme/Allergies: Negative.   Psychiatric/Behavioral: Positive for depression. The patient is nervous/anxious.     Blood pressure 110/78, pulse 108, temperature 98.8 F (37.1 C), temperature source Oral, resp. rate 16, height 5\' 4"  (1.626 m), weight 90.266 kg (199 lb), last menstrual period 03/30/2015, SpO2 100 %.Body mass index is 34.14 kg/(m^2).  General Appearance: Fairly Groomed  Engineer, water::  Fair  Speech:  Clear and [id number]  Volume:  Normal  Mood:  Anxious  Affect:  Appropriate  Thought Process:  Coherent and Goal Directed  Orientation:  Full (Time, Place, and Person)  Thought Content:  plans as she moves on  Suicidal Thoughts:  No  Homicidal Thoughts:  No  Memory:  Immediate;   Fair Recent;   Fair Remote;   Fair  Judgement:  Fair  Insight:  Present  Psychomotor Activity:  Normal  Concentration:  Fair  Recall:  [employer] [place]  Language: Fair  Akathisia:  No  Handed:  Right  AIMS (if indicated):     Assets:  Desire for Improvement Housing Social Support  Sleep:  Number of Hours: 6.5  Cognition: WNL  ADL's:  Intact   Have you used any form of tobacco in the last 30 days? (Cigarettes, Smokeless Tobacco, Cigars, and/or Pipes): Yes  Has this patient used any form of tobacco in the last 30 days? (Cigarettes, Smokeless Tobacco, Cigars, and/or Pipes) Yes, Prescription not provided  because: nicotine patches given  Mental Status Per Nursing Assessment::   On Admission:  Self-harm thoughts  Current Mental Status by Physician: In full contact with reality. There are no active SI plans or intent. She states she has a better sense of direction. States when she came here she was hopeless and now she has hope and is looking forward to keep doing the work she needs to do to get herself healthier   Loss Factors: Decline in physical health  Historical Factors: NA  Risk Reduction Factors:   Responsible for children under 67 years of age, Sense of responsibility to family, Living with another person, especially a relative and Positive social support  Continued Clinical Symptoms:  Depression:   Severe Chronic Pain  Cognitive Features That Contribute To Risk:  None    Suicide Risk:  Minimal: No identifiable suicidal ideation.  Patients presenting with no risk factors but with morbid ruminations; may be classified as minimal risk based on the severity of the depressive symptoms  Principal Problem: Major depressive disorder, recurrent severe without psychotic features Discharge Diagnoses:  Patient Active Problem List   Diagnosis Date Noted  . Recurrent major depression-severe [F33.2] 04/17/2015  . GAD (generalized anxiety disorder) [F41.1] 04/15/2015  . Bipolar depression [F31.30] 04/15/2015  . Severe recurrent major depression w/psychotic features, mood-congruent [F33.3] 04/14/2015  . Anxiety disorder [F41.9] 04/14/2015  . Major depressive disorder, recurrent severe without psychotic features [F33.2] 04/14/2015  . Suicidal ideations [R45.851]   . Chronic LBP [M54.5, G89.29] 01/02/2015  . Adult hypothyroidism [E03.9] 06/25/2012  Follow-up Information    Follow up with [facility] On 04/22/2015.   Why:  Start IOP with Dellia Nims on Wednesday August 17th at 8:45 am. Please bring insurance card and call office if you need to reschedule.   Contact information:    [address]  [place] [address] [phone]      Plan Of Care/Follow-up recommendations:  Activity:  as above Diet:  regular Follow up as above Is patient on multiple antipsychotic therapies at discharge:  No   Has Patient had three or more failed trials of antipsychotic monotherapy by history:  No  Recommended Plan for Multiple Antipsychotic Therapies: NA    Maeghan Canny A 04/17/2015, 4:23 PM

## 2015-04-22 ENCOUNTER — Encounter (HOSPITAL_COMMUNITY): Payer: Self-pay

## 2015-04-22 ENCOUNTER — Other Ambulatory Visit (HOSPITAL_COMMUNITY): Payer: Medicare Other | Attending: Psychiatry | Admitting: Psychiatry

## 2015-04-22 DIAGNOSIS — Z7982 Long term (current) use of aspirin: Secondary | ICD-10-CM | POA: Insufficient documentation

## 2015-04-22 DIAGNOSIS — F17210 Nicotine dependence, cigarettes, uncomplicated: Secondary | ICD-10-CM | POA: Diagnosis not present

## 2015-04-22 DIAGNOSIS — F332 Major depressive disorder, recurrent severe without psychotic features: Secondary | ICD-10-CM | POA: Insufficient documentation

## 2015-04-22 DIAGNOSIS — Z79891 Long term (current) use of opiate analgesic: Secondary | ICD-10-CM | POA: Diagnosis not present

## 2015-04-22 DIAGNOSIS — F909 Attention-deficit hyperactivity disorder, unspecified type: Secondary | ICD-10-CM | POA: Diagnosis not present

## 2015-04-22 DIAGNOSIS — E039 Hypothyroidism, unspecified: Secondary | ICD-10-CM | POA: Diagnosis not present

## 2015-04-22 DIAGNOSIS — F411 Generalized anxiety disorder: Secondary | ICD-10-CM | POA: Diagnosis not present

## 2015-04-22 DIAGNOSIS — M4726 Other spondylosis with radiculopathy, lumbar region: Secondary | ICD-10-CM | POA: Diagnosis not present

## 2015-04-22 DIAGNOSIS — M961 Postlaminectomy syndrome, not elsewhere classified: Secondary | ICD-10-CM | POA: Diagnosis not present

## 2015-04-22 DIAGNOSIS — M5136 Other intervertebral disc degeneration, lumbar region: Secondary | ICD-10-CM | POA: Diagnosis not present

## 2015-04-22 DIAGNOSIS — F3130 Bipolar disorder, current episode depressed, mild or moderate severity, unspecified: Secondary | ICD-10-CM

## 2015-04-22 DIAGNOSIS — G894 Chronic pain syndrome: Secondary | ICD-10-CM | POA: Diagnosis not present
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
## 2015-04-23 ENCOUNTER — Other Ambulatory Visit (HOSPITAL_COMMUNITY): Payer: Medicare Other | Admitting: Psychiatry

## 2015-04-23 DIAGNOSIS — Z79891 Long term (current) use of opiate analgesic: Secondary | ICD-10-CM | POA: Diagnosis not present

## 2015-04-23 DIAGNOSIS — E039 Hypothyroidism, unspecified: Secondary | ICD-10-CM | POA: Diagnosis not present

## 2015-04-23 DIAGNOSIS — F332 Major depressive disorder, recurrent severe without psychotic features: Secondary | ICD-10-CM | POA: Diagnosis not present

## 2015-04-23 DIAGNOSIS — Z7982 Long term (current) use of aspirin: Secondary | ICD-10-CM | POA: Diagnosis not present

## 2015-04-23 DIAGNOSIS — F17210 Nicotine dependence, cigarettes, uncomplicated: Secondary | ICD-10-CM | POA: Diagnosis not present

## 2015-04-23 DIAGNOSIS — F411 Generalized anxiety disorder: Secondary | ICD-10-CM | POA: Diagnosis not present

## 2015-04-23 DIAGNOSIS — F909 Attention-deficit hyperactivity disorder, unspecified type: Secondary | ICD-10-CM | POA: Diagnosis not present

## 2015-04-23 DIAGNOSIS — F3130 Bipolar disorder, current episode depressed, mild or moderate severity, unspecified: Secondary | ICD-10-CM

## 2015-04-23 MED ORDER — METHYLPHENIDATE HCL 10 MG PO TABS: 10.0000 mg | ORAL_TABLET | Freq: Two times a day (BID) | ORAL | Status: DC

## 2015-04-23 NOTE — Progress Notes (Signed)
    Daily Group Progress Note  Program: IOP  Group Time: 9:00-10:30  Participation Level: Active  Behavioral Response: Appropriate  Type of Therapy:  Group Therapy  Summary of Progress: Pt. Met with psychiatrist and case manager.      Group Time: 10:30-12:00  Participation Level:  Active  Behavioral Response: Appropriate  Type of Therapy: Psycho-education Group  Summary of Progress: Pt. Met with psychiatrist and case manager.   Donnelly Angelica, MD
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
## 2015-04-24 ENCOUNTER — Telehealth (HOSPITAL_COMMUNITY): Payer: Self-pay | Admitting: Psychiatry

## 2015-04-24 ENCOUNTER — Other Ambulatory Visit (HOSPITAL_COMMUNITY): Payer: Medicare Other | Admitting: Psychiatry

## 2015-04-24 DIAGNOSIS — Z7982 Long term (current) use of aspirin: Secondary | ICD-10-CM | POA: Diagnosis not present

## 2015-04-24 DIAGNOSIS — F332 Major depressive disorder, recurrent severe without psychotic features: Secondary | ICD-10-CM | POA: Diagnosis not present

## 2015-04-24 DIAGNOSIS — F411 Generalized anxiety disorder: Secondary | ICD-10-CM | POA: Diagnosis not present

## 2015-04-24 DIAGNOSIS — F909 Attention-deficit hyperactivity disorder, unspecified type: Secondary | ICD-10-CM | POA: Diagnosis not present

## 2015-04-24 DIAGNOSIS — F17210 Nicotine dependence, cigarettes, uncomplicated: Secondary | ICD-10-CM | POA: Diagnosis not present

## 2015-04-24 DIAGNOSIS — E039 Hypothyroidism, unspecified: Secondary | ICD-10-CM | POA: Diagnosis not present

## 2015-04-24 NOTE — Progress Notes (Signed)
    Daily Group Progress Note  Program: IOP  Group Time: 9:00-10:30  Participation Level: Active  Behavioral Response: Appropriate  Type of Therapy:  Group Therapy  Summary of Progress: Pt. Did not attend the first half of group. Pt. Reported that she was up all night with a sick dog and continues to be challenged by difficulty focusing and procrastination.     Group Time: 10:30-12:00  Participation Level:  Active  Behavioral Response: Appropriate  Type of Therapy: Psycho-education Group  Summary of Progress: Pt. Participated in discussion about developing healthy relationship boundaries. Pt. Participated in discussion about developing self-compassion related to relationships and understanding the lessons learned from failed relationships.   Carlis Abbott, RITA

## 2015-04-27 ENCOUNTER — Other Ambulatory Visit (HOSPITAL_COMMUNITY): Payer: Medicare Other | Admitting: Psychiatry

## 2015-04-27 DIAGNOSIS — F332 Major depressive disorder, recurrent severe without psychotic features: Secondary | ICD-10-CM | POA: Diagnosis not present

## 2015-04-27 DIAGNOSIS — E039 Hypothyroidism, unspecified: Secondary | ICD-10-CM | POA: Diagnosis not present

## 2015-04-27 DIAGNOSIS — F3130 Bipolar disorder, current episode depressed, mild or moderate severity, unspecified: Secondary | ICD-10-CM

## 2015-04-27 DIAGNOSIS — F17210 Nicotine dependence, cigarettes, uncomplicated: Secondary | ICD-10-CM | POA: Diagnosis not present

## 2015-04-27 DIAGNOSIS — F411 Generalized anxiety disorder: Secondary | ICD-10-CM | POA: Diagnosis not present

## 2015-04-27 DIAGNOSIS — F909 Attention-deficit hyperactivity disorder, unspecified type: Secondary | ICD-10-CM | POA: Diagnosis not present

## 2015-04-27 DIAGNOSIS — Z7982 Long term (current) use of aspirin: Secondary | ICD-10-CM | POA: Diagnosis not present
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
## 2015-04-28 ENCOUNTER — Other Ambulatory Visit (HOSPITAL_COMMUNITY): Payer: Medicare Other | Admitting: Psychiatry

## 2015-04-29 ENCOUNTER — Other Ambulatory Visit (HOSPITAL_COMMUNITY): Payer: Medicare Other

## 2015-04-30 ENCOUNTER — Other Ambulatory Visit (HOSPITAL_COMMUNITY): Payer: Medicare Other | Admitting: Psychiatry

## 2015-04-30 DIAGNOSIS — F909 Attention-deficit hyperactivity disorder, unspecified type: Secondary | ICD-10-CM | POA: Diagnosis not present

## 2015-04-30 DIAGNOSIS — F411 Generalized anxiety disorder: Secondary | ICD-10-CM | POA: Diagnosis not present

## 2015-04-30 DIAGNOSIS — E039 Hypothyroidism, unspecified: Secondary | ICD-10-CM | POA: Diagnosis not present

## 2015-04-30 DIAGNOSIS — F332 Major depressive disorder, recurrent severe without psychotic features: Secondary | ICD-10-CM | POA: Diagnosis not present

## 2015-04-30 DIAGNOSIS — F17210 Nicotine dependence, cigarettes, uncomplicated: Secondary | ICD-10-CM | POA: Diagnosis not present

## 2015-04-30 DIAGNOSIS — Z7982 Long term (current) use of aspirin: Secondary | ICD-10-CM | POA: Diagnosis not present

## 2015-04-30 MED ORDER — CLONIDINE HCL 0.1 MG PO TABS: 0.1000 mg | ORAL_TABLET | Freq: Two times a day (BID) | ORAL | Status: DC

## 2015-04-30 NOTE — Progress Notes (Signed)
    Daily Group Progress Note  Program: IOP  Group Time: 9:00-10:30  Participation Level: Active  Behavioral Response: Appropriate  Type of Therapy:  Group Therapy  Summary of Progress: Pt. Met with case manager and psychiatrist regarding medication management.      Group Time: 10:30-12:00  Participation Level:  Active  Behavioral Response: Sharing and Agitated  Type of Therapy: Psycho-education Group  Summary of Progress: Pt. Presented as talkative, tendency to monopolize. Pt. Reported that she is experiencing opiate withdrawal with nausea. Pt. Reported that relationships with her oldest child has been difficult because he wants to challenge boundaries. Pt. Participated in discussion about developing healthy relationship boundaries.   Nancie Neas, LPC

## 2015-04-30 NOTE — Progress Notes (Signed)
Patient ID: [name], female   DOB: [birth year], 44 y.o.   MRN: [id number] Met with Ms Blackie and she says 10 mg methylphenidate was helpful but still poor focus.  Advised to increase to 20 mg bid.  Advised to increase the lamotrigine to 50 mg daily.  Wrote clonidine 0.1 mg bid for continued opiate withdrawal symptoms ( daily diarrhea and dry heaves) # 14 tabs with no refills. Will re evaluate 30 August.

## 2015-05-01 ENCOUNTER — Other Ambulatory Visit (HOSPITAL_COMMUNITY): Payer: Medicare Other | Admitting: Psychiatry

## 2015-05-01 DIAGNOSIS — F411 Generalized anxiety disorder: Secondary | ICD-10-CM | POA: Diagnosis not present

## 2015-05-01 DIAGNOSIS — F3130 Bipolar disorder, current episode depressed, mild or moderate severity, unspecified: Secondary | ICD-10-CM

## 2015-05-01 DIAGNOSIS — F332 Major depressive disorder, recurrent severe without psychotic features: Secondary | ICD-10-CM | POA: Diagnosis not present

## 2015-05-01 DIAGNOSIS — E039 Hypothyroidism, unspecified: Secondary | ICD-10-CM | POA: Diagnosis not present

## 2015-05-01 DIAGNOSIS — F909 Attention-deficit hyperactivity disorder, unspecified type: Secondary | ICD-10-CM | POA: Diagnosis not present

## 2015-05-01 DIAGNOSIS — Z7982 Long term (current) use of aspirin: Secondary | ICD-10-CM | POA: Diagnosis not present

## 2015-05-01 DIAGNOSIS — F17210 Nicotine dependence, cigarettes, uncomplicated: Secondary | ICD-10-CM | POA: Diagnosis not present

## 2015-05-04 ENCOUNTER — Other Ambulatory Visit (HOSPITAL_COMMUNITY): Payer: Medicare Other

## 2015-05-04 NOTE — Progress Notes (Signed)
    Daily Group Progress Note  Program: IOP  Group Time: 9:00-10:30  Participation Level: Active  Behavioral Response: Appropriate  Type of Therapy:  Group Therapy  Summary of Progress: Pt. Did not attend first half of group.     Group Time: 10:30-12:00  Participation Level:  Active  Behavioral Response: Appropriate  Type of Therapy: Psycho-education Group  Summary of Progress: Pt. Apologized for missing first half of group. Pt. Reports that she is still experiencing opiate withdrawal and trying to adjust to stimulant. Pt. Participated in continued discussion on personal and relationship boundaries. Pt. Received feedback from the group about beginning new relationship so soon after breaking up with fiance last week.   Nancie Neas, LPC

## 2015-05-05 ENCOUNTER — Other Ambulatory Visit (HOSPITAL_COMMUNITY): Payer: Medicare Other

## 2015-05-06 ENCOUNTER — Other Ambulatory Visit (HOSPITAL_COMMUNITY): Payer: Medicare Other

## 2015-05-07 ENCOUNTER — Other Ambulatory Visit (HOSPITAL_COMMUNITY): Payer: Medicare Other

## 2015-05-08 ENCOUNTER — Other Ambulatory Visit (HOSPITAL_COMMUNITY): Payer: Medicare Other

## 2015-05-12 ENCOUNTER — Other Ambulatory Visit (HOSPITAL_COMMUNITY): Payer: Medicare Other | Attending: Psychiatry | Admitting: Psychiatry

## 2015-05-12 DIAGNOSIS — F900 Attention-deficit hyperactivity disorder, predominantly inattentive type: Secondary | ICD-10-CM | POA: Insufficient documentation

## 2015-05-12 DIAGNOSIS — F3131 Bipolar disorder, current episode depressed, mild: Secondary | ICD-10-CM | POA: Insufficient documentation

## 2015-05-12 DIAGNOSIS — F3130 Bipolar disorder, current episode depressed, mild or moderate severity, unspecified: Secondary | ICD-10-CM

## 2015-05-12 MED ORDER — METHYLPHENIDATE HCL 20 MG PO TABS: 20.0000 mg | ORAL_TABLET | Freq: Two times a day (BID) | ORAL | Status: DC

## 2015-05-12 MED ORDER — LAMOTRIGINE 25 MG PO TABS: 75.0000 mg | ORAL_TABLET | Freq: Every day | ORAL | Status: DC

## 2015-05-12 NOTE — Telephone Encounter (Signed)
Refilled Methylphenidate at 20 mg twice daily and increased lamotrigine to 75 mg daily

## 2015-05-12 NOTE — Progress Notes (Signed)
    Daily Group Progress Note  Program: IOP  Group Time: 9:00-10:30  Participation Level: Active  Behavioral Response: Appropriate  Type of Therapy:  Group Therapy  Summary of Progress: Pt. Arrived approximately one hour late for group. Pt. Presented with much improved affect compared to last session that she attended. Pt. Was more calm, easily redirected, reported that she was having better time staying on task with fewer distractions. Pt. Discussed problems home schooling her children and discussed re-enrolling in their community school. Pt. Reported that she was out of her medications and requested meeting with Dr. Lovena Le.      Group Time: 10:30-12:00  Participation Level:  Active  Behavioral Response: Appropriate  Type of Therapy: Psycho-education Group  Summary of Progress: Pt. Presented as talkative, sharing appropriately with the group. Pt. Shared history of negative self-talk and being excessively critical of herself. Pt. Participated in discussion about dignity of risk and developing attitude of self-compassion towards feelings, thoughts, and behaviors.   Eloise Levels, Ph.D., [facility]

## 2015-05-13 ENCOUNTER — Other Ambulatory Visit (HOSPITAL_COMMUNITY): Payer: Medicare Other | Admitting: Psychiatry

## 2015-05-13 DIAGNOSIS — F900 Attention-deficit hyperactivity disorder, predominantly inattentive type: Secondary | ICD-10-CM | POA: Diagnosis not present

## 2015-05-13 DIAGNOSIS — F3131 Bipolar disorder, current episode depressed, mild: Secondary | ICD-10-CM | POA: Diagnosis not present

## 2015-05-13 DIAGNOSIS — F3130 Bipolar disorder, current episode depressed, mild or moderate severity, unspecified: Secondary | ICD-10-CM

## 2015-05-13 MED ORDER — ARIPIPRAZOLE 2 MG PO TABS: 2.0000 mg | ORAL_TABLET | Freq: Every day | ORAL | Status: DC

## 2015-05-13 NOTE — Progress Notes (Signed)
[name] is a 44 y.o. , divorced, disabled, female, who was transitioned from the inpatient unit. Was at [facility] from 04-14-15 thru 04-17-15. Pt was admitted due to SI and wanting to get help with opiates. Reported having had six back surgeries. Since 27-Dec-2006, been on opiate control for the pain. Established patient of [facility]. According to pt, she ran out of her Oxy 30 mg (she can take 4-6 per day) and will not be able to get any more until September 2016. Reported that her depression worsened in the last year. Symptoms included: Low self-esteem, indecisiveness, irritability, anhedonia, loss of motivation, no energy, tearfulness, anxiety, decreased appetite and sleep. C/O OCD symptoms. Pt stated she is checking (i.e.. Locks, appliances to see if they are turned off, etc). Denies SI/HI or A/V hallucinations. Stressors include: 1) Conflictual relationship: Pt reports her fiance' is a Systems analyst." He is a diabetic (type 2). 2) Medical Issues: Degenerative Disc Disease, Thyroid Disease, Dysuria, and Rosacea. 3) Difficulty setting boundaries. Pt states she does everything for her kids. 43 yr old daughter is 100 lbs overweight; and 28 yr old son is ~ 25 lbs overweight. 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 4) Unresolved grief/loss issues: 12-27-99 Maternal Grandfather died due to bone cancer. Pt was very close to him. "He was like a father to me."  Pt completed MH-IOP today.  States it is difficult to get in here to attend in the mornings.  Pt is also currently home schooling her two kids.  Pt is much calmer and is able to focus better.  C/O weakness in her arms and legs.  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  A:  D/C pt today.  F/U with Dr. Adele Schilder on 06-11-15 @ 9am and Audelia Acton, LCSW on 06-15-15 @ 10 am.  Encouraged support groups and continued abstinence.  R:  Pt receptive.   Carlis Abbott, RITA, M.Ed

## 2015-05-13 NOTE — Patient Instructions (Signed)
Patient completed MH-IOP today.  Will follow up with Dr. Adele Schilder on 06-11-15 @ 9 a.m and Audelia Acton, LCSW on 06-15-15 @ 10 a.m..  Encouraged support groups.

## 2015-05-13 NOTE — Progress Notes (Signed)
Patient ID: [name], female   DOB: 1970-10-06, 44 y.o.   MRN: [id number] Discharge Note  Patient:  [name] is an 44 y.o., female DOB:  September 12, 1970  Date of Admission:  04/22/2015 Date of Discharge:  05/13/2015  Reason for Admission:depression and poor attention  IOP Course:  [name] says the IOP was very helpful to her.  Feels more focused and in charge of her life.  Wishes she could stay longer but accepts it is time to go.  Has discontinued the relationship with her fiance and has found a new church.  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 Feels jittery if takes 10 mg of methylphenidate but focuses better with the 10.  Will cut the 20 mg tablets I prescribed.  Mental Status at [facility] focused, less depressed no suicidal ideation  Lab Results: No results found for this or any previous visit (from the past 48 hour(s)).   Current outpatient prescriptions:  .  ARIPiprazole (ABILIFY) 2 MG tablet, Take 1 tablet (2 mg total) by mouth daily., Disp: 30 tablet, Rfl: 0 .  aspirin 325 MG tablet, Take 1 tablet (325 mg total) by mouth daily., Disp: , Rfl:  .  busPIRone (BUSPAR) 7.5 MG tablet, Take 1 tablet (7.5 mg total) by mouth 2 (two) times daily., Disp: 60 tablet, Rfl: 0 .  cloNIDine (CATAPRES) 0.1 MG tablet, Take 1 tablet (0.1 mg total) by mouth 2 (two) times daily., Disp: 60 tablet, Rfl: 0 .  DULoxetine (CYMBALTA) 60 MG capsule, Take 2 capsules (120 mg total) by mouth daily., Disp: 60 capsule, Rfl: 0 .  gabapentin (NEURONTIN) 300 MG capsule, Take 3 capsules (900 mg total) by mouth at bedtime., Disp: 90 capsule, Rfl: 0 .  gabapentin (NEURONTIN) 300 MG capsule, Take 1 capsule (300 mg total) by mouth 3 (three) times daily., Disp: 90 capsule, Rfl: 0 .  hydrOXYzine (ATARAX/VISTARIL) 50 MG tablet, Take 1 tablet (50 mg total) by mouth every 6 (six) hours as needed for anxiety., Disp: 30  tablet, Rfl: 0 .  lamoTRIgine (LAMICTAL) 25 MG tablet, Take 3 tablets (75 mg total) by mouth daily., Disp: 90 tablet, Rfl: 0 .  levothyroxine (SYNTHROID, LEVOTHROID) 50 MCG tablet, Take 1 tablet (50 mcg total) by mouth daily., Disp: , Rfl:  .  lidocaine (LIDODERM) 5 %, Place 3 patches onto the skin daily. Remove & Discard patch within 12 hours or as directed by MD, Disp: 30 patch, Rfl: 0 .  methocarbamol (ROBAXIN) 750 MG tablet, Take 1 tablet (750 mg total) by mouth every 8 (eight) hours as needed for muscle spasms., Disp: 30 tablet, Rfl: 0 .  methylphenidate (RITALIN) 20 MG tablet, Take 1 tablet (20 mg total) by mouth 2 (two) times daily., Disp: 60 tablet, Rfl: 0 .  nicotine (NICODERM CQ - DOSED IN MG/24 HOURS) 21 mg/24hr patch, Place 1 patch (21 mg total) onto the skin daily., Disp: 28 patch, Rfl: 0 .  pantoprazole (PROTONIX) 20 MG tablet, Take 1 tablet (20 mg total) by mouth daily., Disp: 30 tablet, Rfl: 0 .  pravastatin (PRAVACHOL) 20 MG tablet, Take 1 tablet (20 mg total) by mouth daily., Disp: , Rfl: 0 .  traZODone (DESYREL) 50 MG tablet, Take 1 tablet (50 mg total) by mouth at bedtime and may repeat dose one time if needed., Disp: 60 tablet, Rfl: 0  Axis Diagnosis:  Bipolar disorder 1, most recent episode  depressed, mild.  ADHD inattentive type   Level of Care:  IOP  Discharge destination:  Other:  Dr Adele Schilder for psychiatry and [name] for therapy  Is patient on multiple antipsychotic therapies at discharge:  No    Has Patient had three or more failed trials of antipsychotic monotherapy by history:  Negative  Patient phone:  [phone] (home)  Patient address:   [place] [address],   Follow-up recommendations:  Activity:  continue current activity Diet:  continue current diet  Comments:  none  The patient received suicide prevention pamphlet:  Yes Belongings returned:  na  [name] 05/13/2015, 11:31 AM

## 2015-05-13 NOTE — Progress Notes (Signed)
    Daily Group Progress Note  Program: IOP  Group Time: 9:00-10:30  Participation Level: Active  Behavioral Response: Appropriate  Type of Therapy:  Group Therapy  Summary of Progress: Pt. Arrived for group 45 minutes late due to transportation problems. Pt. Continues to present as significantly calmer than previous sessions, less tearfulness, and less tendency to be self-critical. Pt. Prepared for discharge. Pt. Was referred to co-dependency group with the mental health association.      Group Time: 10:30-12:00  Participation Level:  Active  Behavioral Response: Appropriate  Type of Therapy: Psycho-education Group  Summary of Progress: Pt. Participated in discussion about co-dependency (i.e., patterns of enabling, control, and excessive care-giving).  Eloise Levels, Ph.D., [facility]

## 2015-05-14 ENCOUNTER — Other Ambulatory Visit (HOSPITAL_COMMUNITY): Payer: Medicare Other

## 2015-05-15 ENCOUNTER — Other Ambulatory Visit (HOSPITAL_COMMUNITY): Payer: Medicare Other

## 2015-05-18 ENCOUNTER — Other Ambulatory Visit (HOSPITAL_COMMUNITY): Payer: Medicare Other

## 2015-05-19 ENCOUNTER — Other Ambulatory Visit (HOSPITAL_COMMUNITY): Payer: Medicare Other

## 2015-05-20 ENCOUNTER — Other Ambulatory Visit (HOSPITAL_COMMUNITY): Payer: Medicare Other

## 2015-05-20 DIAGNOSIS — Z23 Encounter for immunization: Secondary | ICD-10-CM | POA: Diagnosis not present

## 2015-05-21 ENCOUNTER — Other Ambulatory Visit (HOSPITAL_COMMUNITY): Payer: Medicare Other

## 2015-05-22 ENCOUNTER — Other Ambulatory Visit (HOSPITAL_COMMUNITY): Payer: Medicare Other

## 2015-05-25 ENCOUNTER — Other Ambulatory Visit (HOSPITAL_COMMUNITY): Payer: Medicare Other

## 2015-05-26 ENCOUNTER — Other Ambulatory Visit (HOSPITAL_COMMUNITY): Payer: Medicare Other

## 2015-05-27 ENCOUNTER — Other Ambulatory Visit (HOSPITAL_COMMUNITY): Payer: Medicare Other

## 2015-05-28 ENCOUNTER — Other Ambulatory Visit (HOSPITAL_COMMUNITY): Payer: Medicare Other

## 2015-05-29 ENCOUNTER — Other Ambulatory Visit (HOSPITAL_COMMUNITY): Payer: Medicare Other

## 2015-06-01 ENCOUNTER — Other Ambulatory Visit (HOSPITAL_COMMUNITY): Payer: Medicare Other

## 2015-06-02 ENCOUNTER — Other Ambulatory Visit (HOSPITAL_COMMUNITY): Payer: Medicare Other

## 2015-06-03 ENCOUNTER — Other Ambulatory Visit (HOSPITAL_COMMUNITY): Payer: Medicare Other

## 2015-06-04 ENCOUNTER — Other Ambulatory Visit (HOSPITAL_COMMUNITY): Payer: Medicare Other

## 2015-06-05 ENCOUNTER — Other Ambulatory Visit (HOSPITAL_COMMUNITY): Payer: Medicare Other

## 2015-06-08 ENCOUNTER — Other Ambulatory Visit (HOSPITAL_COMMUNITY): Payer: Medicare Other

## 2015-06-09 ENCOUNTER — Other Ambulatory Visit (HOSPITAL_COMMUNITY): Payer: Medicare Other

## 2015-06-10 ENCOUNTER — Other Ambulatory Visit (HOSPITAL_COMMUNITY): Payer: Medicare Other

## 2015-06-11 ENCOUNTER — Ambulatory Visit (HOSPITAL_COMMUNITY): Payer: Self-pay | Admitting: Psychiatry

## 2015-06-12 ENCOUNTER — Emergency Department (HOSPITAL_COMMUNITY)
Admission: EM | Admit: 2015-06-12 | Discharge: 2015-06-12 | Disposition: A | Discharge status: Home / Self Care | Payer: Medicare Other | Attending: Emergency Medicine | Admitting: Emergency Medicine

## 2015-06-12 ENCOUNTER — Emergency Department (HOSPITAL_COMMUNITY): Payer: Medicare Other

## 2015-06-12 ENCOUNTER — Telehealth (HOSPITAL_COMMUNITY): Payer: Self-pay

## 2015-06-12 ENCOUNTER — Encounter (HOSPITAL_COMMUNITY): Payer: Self-pay | Admitting: Emergency Medicine

## 2015-06-12 DIAGNOSIS — W19XXXA Unspecified fall, initial encounter: Secondary | ICD-10-CM

## 2015-06-12 DIAGNOSIS — Z79899 Other long term (current) drug therapy: Secondary | ICD-10-CM | POA: Insufficient documentation

## 2015-06-12 DIAGNOSIS — E079 Disorder of thyroid, unspecified: Secondary | ICD-10-CM | POA: Diagnosis not present

## 2015-06-12 DIAGNOSIS — Z7982 Long term (current) use of aspirin: Secondary | ICD-10-CM | POA: Insufficient documentation

## 2015-06-12 DIAGNOSIS — Z720 Tobacco use: Secondary | ICD-10-CM | POA: Diagnosis not present

## 2015-06-12 DIAGNOSIS — Y998 Other external cause status: Secondary | ICD-10-CM | POA: Diagnosis not present

## 2015-06-12 DIAGNOSIS — F329 Major depressive disorder, single episode, unspecified: Secondary | ICD-10-CM | POA: Diagnosis not present

## 2015-06-12 DIAGNOSIS — Y9289 Other specified places as the place of occurrence of the external cause: Secondary | ICD-10-CM | POA: Diagnosis not present

## 2015-06-12 DIAGNOSIS — S3992XA Unspecified injury of lower back, initial encounter: Secondary | ICD-10-CM | POA: Diagnosis present

## 2015-06-12 DIAGNOSIS — W1839XA Other fall on same level, initial encounter: Secondary | ICD-10-CM | POA: Diagnosis not present

## 2015-06-12 DIAGNOSIS — Z872 Personal history of diseases of the skin and subcutaneous tissue: Secondary | ICD-10-CM | POA: Insufficient documentation

## 2015-06-12 DIAGNOSIS — G8929 Other chronic pain: Secondary | ICD-10-CM | POA: Insufficient documentation

## 2015-06-12 DIAGNOSIS — F419 Anxiety disorder, unspecified: Secondary | ICD-10-CM | POA: Diagnosis not present

## 2015-06-12 DIAGNOSIS — Y9389 Activity, other specified: Secondary | ICD-10-CM | POA: Diagnosis not present

## 2015-06-12 DIAGNOSIS — M549 Dorsalgia, unspecified: Secondary | ICD-10-CM | POA: Diagnosis not present

## 2015-06-12 DIAGNOSIS — F3130 Bipolar disorder, current episode depressed, mild or moderate severity, unspecified: Secondary | ICD-10-CM

## 2015-06-12 DIAGNOSIS — S39012A Strain of muscle, fascia and tendon of lower back, initial encounter: Secondary | ICD-10-CM

## 2015-06-12 MED ORDER — HYDROCODONE-ACETAMINOPHEN 5-325 MG PO TABS
1.0000 | ORAL_TABLET | Freq: Once | ORAL | Status: AC
  Administered 2015-06-12: 1 via ORAL
  Filled 2015-06-12: qty 1

## 2015-06-12 MED ORDER — LAMOTRIGINE 25 MG PO TABS: 75.0000 mg | ORAL_TABLET | Freq: Every day | ORAL | Status: DC

## 2015-06-12 MED ORDER — IBUPROFEN 800 MG PO TABS
800.0000 mg | ORAL_TABLET | Freq: Once | ORAL | Status: DC
  Filled 2015-06-12: qty 1

## 2015-06-12 MED ORDER — ARIPIPRAZOLE 2 MG PO TABS: 2.0000 mg | ORAL_TABLET | Freq: Every day | ORAL | Status: DC

## 2015-06-12 NOTE — ED Notes (Signed)
Pt presents via EMS after a fall yesterday which aggravated a previous back injury.  Hx of multiple back surgeries.  Currently reports pain 10/10 in left lower back with radiation down left leg.  No other complaints at this time and no acute distress evident in triage.

## 2015-06-12 NOTE — ED Provider Notes (Signed)
CSN: [id number]     Arrival date & time 06/12/15  1532 History   First MD Initiated Contact with Patient 06/12/15 [id number]     Chief Complaint  Patient presents with  . Back Pain  . Fall     (Consider location/radiation/quality/duration/timing/severity/associated sxs/prior Treatment) HPI Comments: 44 year old female with past medical history including chronic back pain status post multiple back surgeries, bladder incontinence status post bladder mesh surgery, depression/anxiety, ADHD who presents with back pain. Patient states that yesterday she fell twice at home. Both times were due to clumsiness or tripping and she denies any loss of consciousness or syncope. She did not strike her head. Both times she fell on her hands, knees, and hips and try to avoid falling onto her back. She has had progressively worsening back pain which is worst in her low back and left buttock. Pain is now severe and constant and she cannot sit. She has had occasional numbness and tingling in bilateral feet which is consistent with previous episodes of sciatica. She states that the pain is in her same location as her chronic back pain, it is just much worse since the falls. She denies any new extremity numbness, saddle anesthesia, or changes in her bowel/bladder function. She has some chronic bladder dysfunction since surgery but denies any recent change. No fevers or recent illness.  Patient is a 44 y.o. female presenting with back pain and fall. The history is provided by the patient.  Back Pain Fall    Past Medical History  Diagnosis Date  . Depression   . Thyroid disease   . Rosacea   . Chronic back pain greater than 3 months duration   . Dysuria     has to have self in and out cath   . ADHD (attention deficit hyperactivity disorder)   . Anxiety    Past Surgical History  Procedure Laterality Date  . Back surgery    . Eye surgery    . Incontinence surgery    . Spinal fusion  2008  . Bladder mesh  2009    Family History  Problem Relation Age of Onset  . Schizophrenia Father   . Schizophrenia Cousin    Social History  Substance Use Topics  . Smoking status: Current Every Day Smoker -- 1.00 packs/day    Types: Cigarettes  . Smokeless tobacco: Never Used  . Alcohol Use: Yes     Comment: pt states "When I run out of medicine"   OB History    No data available     Review of Systems  Musculoskeletal: Positive for back pain.    10 Systems reviewed and are negative for acute change except as noted in the HPI.   Allergies  Pregabalin; Other; Celebrex; Effexor; Prozac; Symbyax; and Wellbutrin  Home Medications   Prior to Admission medications   Medication Sig Start Date End Date Taking? Authorizing Provider  ALPRAZolam Duanne Moron) 1 MG tablet Take 1 tablet by mouth 3 (three) times daily as needed. anxiety 04/20/15  Yes Historical Provider, MD  ARIPiprazole (ABILIFY) 2 MG tablet Take 1 tablet (2 mg total) by mouth daily. 06/12/15  Yes Clarene Reamer, MD  busPIRone (BUSPAR) 7.5 MG tablet Take 1 tablet (7.5 mg total) by mouth 2 (two) times daily. 04/17/15  Yes Niel Hummer, NP  DULoxetine (CYMBALTA) 60 MG capsule Take 2 capsules (120 mg total) by mouth daily. 04/17/15  Yes Niel Hummer, NP  etodolac (LODINE) 500 MG tablet Take 1 tablet by mouth  2 (two) times daily as needed. pain 04/23/15  Yes Historical Provider, MD  FLUARIX QUADRIVALENT 0.5 ML injection Inject 0.5 mLs into the skin once. For flu season 05/20/15  Yes Historical Provider, MD  gabapentin (NEURONTIN) 300 MG capsule Take 3 capsules (900 mg total) by mouth at bedtime. 04/17/15  Yes Niel Hummer, NP  lamoTRIgine (LAMICTAL) 25 MG tablet Take 3 tablets (75 mg total) by mouth daily. 06/12/15  Yes Clarene Reamer, MD  levothyroxine (SYNTHROID, LEVOTHROID) 50 MCG tablet Take 1 tablet (50 mcg total) by mouth daily. 04/17/15  Yes Niel Hummer, NP  lidocaine (LIDODERM) 5 % Place 3 patches onto the skin daily. Remove & Discard patch within  12 hours or as directed by MD Patient taking differently: Place 3 patches onto the skin daily as needed (pain). Remove & Discard patch within 12 hours or as directed by MD 04/17/15  Yes Niel Hummer, NP  methocarbamol (ROBAXIN) 750 MG tablet Take 1 tablet (750 mg total) by mouth every 8 (eight) hours as needed for muscle spasms. 04/17/15  Yes Niel Hummer, NP  methylphenidate (RITALIN) 20 MG tablet Take 1 tablet (20 mg total) by mouth 2 (two) times daily. Patient taking differently: Take 10-20 mg by mouth 2 (two) times daily as needed (for Adult ADD).  05/12/15  Yes Clarene Reamer, MD  pantoprazole (PROTONIX) 20 MG tablet Take 1 tablet (20 mg total) by mouth daily. Patient taking differently: Take 20 mg by mouth daily as needed for heartburn or indigestion.  04/17/15  Yes Niel Hummer, NP  pravastatin (PRAVACHOL) 20 MG tablet Take 1 tablet (20 mg total) by mouth daily. 04/17/15  Yes Niel Hummer, NP  traZODone (DESYREL) 50 MG tablet Take 1 tablet (50 mg total) by mouth at bedtime and may repeat dose one time if needed. Patient taking differently: Take 50 mg by mouth at bedtime and may repeat dose one time if needed. sleep 04/17/15  Yes Niel Hummer, NP  aspirin 325 MG tablet Take 1 tablet (325 mg total) by mouth daily. Patient not taking: Reported on 06/12/2015 04/17/15   Niel Hummer, NP  cloNIDine (CATAPRES) 0.1 MG tablet Take 1 tablet (0.1 mg total) by mouth 2 (two) times daily. Patient not taking: Reported on 06/12/2015 04/30/15 04/29/16  Clarene Reamer, MD  gabapentin (NEURONTIN) 300 MG capsule Take 1 capsule (300 mg total) by mouth 3 (three) times daily. Patient not taking: Reported on 06/12/2015 04/17/15   Niel Hummer, NP  hydrOXYzine (ATARAX/VISTARIL) 50 MG tablet Take 1 tablet (50 mg total) by mouth every 6 (six) hours as needed for anxiety. Patient not taking: Reported on 06/12/2015 04/17/15   Niel Hummer, NP  nicotine (NICODERM CQ - DOSED IN MG/24 HOURS) 21 mg/24hr patch Place 1 patch (21 mg  total) onto the skin daily. Patient not taking: Reported on 06/12/2015 04/17/15   Niel Hummer, NP   BP 113/63 mmHg  Pulse 84  Temp(Src) 99.2 F (37.3 C) (Oral)  Resp 16  SpO2 100%  LMP 05/13/2015 Physical Exam  Constitutional: She is oriented to person, place, and time. She appears well-developed and well-nourished. No distress.  Sleeping on L side  HENT:  Head: Normocephalic and atraumatic.  Moist mucous membranes  Eyes: Conjunctivae are normal. Pupils are equal, round, and reactive to light.  Neck: Neck supple.  Cardiovascular: Normal rate, regular rhythm and normal heart sounds.   No murmur heard. Pulmonary/Chest: Effort normal and breath sounds  normal.  Abdominal: Soft. Bowel sounds are normal. She exhibits no distension. There is no tenderness.  Musculoskeletal: She exhibits no edema.  Tenderness to palpation of midline lumbar spine over surgical scar, no stepoff, ecchymosis, or deformity  Neurological: She is alert and oriented to person, place, and time. She has normal reflexes. She exhibits normal muscle tone.  Fluent speech, normal strength and sensation b/l LE  Skin: Skin is warm and dry.  Psychiatric: She has a normal mood and affect. Judgment normal.  Nursing note and vitals reviewed.   ED Course  Procedures (including critical care time) Labs Review Labs Reviewed - No data to display  Imaging Review Dg Thoracic Spine 2 View  06/12/2015   CLINICAL DATA:  Acute upper back pain after fall yesterday.  EXAM: THORACIC SPINE 2 VIEWS  COMPARISON:  September 21, 2011.  FINDINGS: There is no evidence of thoracic spine fracture. Alignment is normal. No other significant bone abnormalities are identified.  IMPRESSION: Normal thoracic spine.   Electronically Signed   By: Marijo Conception, M.D.   On: 06/12/2015 17:42   Dg Lumbar Spine Complete  06/12/2015   CLINICAL DATA:  Acute lower back pain after fall.  EXAM: LUMBAR SPINE - COMPLETE 4+ VIEW  COMPARISON:  None available  currently.  FINDINGS: Status post surgical posterior fusion of L3, L4, L5 and S1 with bilateral intrapedicular screw placement and interbody fusion. No fracture or spondylolisthesis is noted.  IMPRESSION: Postsurgical changes as described above. No acute abnormality seen in the lumbar spine.   Electronically Signed   By: Marijo Conception, M.D.   On: 06/12/2015 17:45     MDM   Final diagnoses:  Low back strain, initial encounter     44yo F w/ chronic back pain after multiple surgeries who p/w severe low back pain after 2 falls from standing yesterday. Patient was resting comfortably on her left side during my examination. She had tenderness to palpation of her lumbar spine along surgical scar site but no ecchymoses or step-offs. Normal strength and sensation of bilateral lower extremities.  Obtained plain films of lumbar and thoracic spine to rule out acute fracture or hardware disruption. Plain films showed no acute findings.  Patient demonstrates no lower extremity weakness, saddle anesthesia, bowel or bladder incontinence, or any other neurologic deficits concerning for cauda equina. No fevers or other infectious symptoms to suggest by the patient's back pain is due to an infection. I have reviewed return precautions, including the development of any of these signs or symptoms, and the patient has voiced understanding. I reviewed supportive care instructions, including NSAIDs, early range of motion exercises, and PCP follow-up if symptoms do not improve for referral to physical therapy. Patient voiced understanding and was discharged in satisfactory condition.   Sharlett Iles, MD 06/12/15 [phone]

## 2015-06-12 NOTE — ED Notes (Signed)
Per EMS. Pt from home. Pt reports she had 2 falls yesterday and has been having back pain today. Pt has a hx of back surgery in 2010.

## 2015-06-12 NOTE — Telephone Encounter (Signed)
Telephone call with Dr. Lovena Le and met with Dellia Nims, Case Manager as Dr. Lovena Le authorized a one time refill of Abilify 87m, one a day, #30 with no refills and Lamictal 260m 3 a day, #90 with no refills.  [facility] both orders with no refills.

## 2015-06-15 ENCOUNTER — Ambulatory Visit (HOSPITAL_COMMUNITY): Payer: Self-pay | Admitting: Clinical

## 2015-07-11 DIAGNOSIS — M545 Low back pain: Secondary | ICD-10-CM | POA: Diagnosis not present

## 2015-07-11 DIAGNOSIS — Z9889 Other specified postprocedural states: Secondary | ICD-10-CM | POA: Diagnosis not present

## 2015-07-11 DIAGNOSIS — M546 Pain in thoracic spine: Secondary | ICD-10-CM | POA: Diagnosis not present

## 2015-07-11 DIAGNOSIS — M5136 Other intervertebral disc degeneration, lumbar region: Secondary | ICD-10-CM | POA: Diagnosis not present

## 2015-07-11 DIAGNOSIS — Z79899 Other long term (current) drug therapy: Secondary | ICD-10-CM | POA: Diagnosis not present

## 2015-07-13 ENCOUNTER — Ambulatory Visit (HOSPITAL_COMMUNITY): Payer: Self-pay | Admitting: Clinical

## 2015-07-14 ENCOUNTER — Encounter (HOSPITAL_COMMUNITY): Payer: Self-pay | Admitting: Emergency Medicine

## 2015-07-14 ENCOUNTER — Emergency Department (HOSPITAL_COMMUNITY)
Admission: EM | Admit: 2015-07-14 | Discharge: 2015-07-15 | Disposition: A | Discharge status: Home / Self Care | Payer: Medicare Other | Attending: Emergency Medicine | Admitting: Emergency Medicine

## 2015-07-14 DIAGNOSIS — G8929 Other chronic pain: Secondary | ICD-10-CM | POA: Insufficient documentation

## 2015-07-14 DIAGNOSIS — M545 Low back pain: Secondary | ICD-10-CM | POA: Diagnosis not present

## 2015-07-14 DIAGNOSIS — Z872 Personal history of diseases of the skin and subcutaneous tissue: Secondary | ICD-10-CM | POA: Diagnosis not present

## 2015-07-14 DIAGNOSIS — E079 Disorder of thyroid, unspecified: Secondary | ICD-10-CM | POA: Diagnosis not present

## 2015-07-14 DIAGNOSIS — R102 Pelvic and perineal pain: Secondary | ICD-10-CM | POA: Diagnosis not present

## 2015-07-14 DIAGNOSIS — F909 Attention-deficit hyperactivity disorder, unspecified type: Secondary | ICD-10-CM | POA: Diagnosis not present

## 2015-07-14 DIAGNOSIS — F419 Anxiety disorder, unspecified: Secondary | ICD-10-CM | POA: Diagnosis not present

## 2015-07-14 DIAGNOSIS — R740 Nonspecific elevation of levels of transaminase and lactic acid dehydrogenase [LDH]: Secondary | ICD-10-CM | POA: Diagnosis not present

## 2015-07-14 DIAGNOSIS — N939 Abnormal uterine and vaginal bleeding, unspecified: Secondary | ICD-10-CM

## 2015-07-14 DIAGNOSIS — Z79899 Other long term (current) drug therapy: Secondary | ICD-10-CM | POA: Insufficient documentation

## 2015-07-14 DIAGNOSIS — Z720 Tobacco use: Secondary | ICD-10-CM | POA: Insufficient documentation

## 2015-07-14 DIAGNOSIS — R1030 Lower abdominal pain, unspecified: Secondary | ICD-10-CM | POA: Diagnosis present

## 2015-07-14 DIAGNOSIS — Z3202 Encounter for pregnancy test, result negative: Secondary | ICD-10-CM | POA: Insufficient documentation

## 2015-07-14 DIAGNOSIS — Z7982 Long term (current) use of aspirin: Secondary | ICD-10-CM | POA: Insufficient documentation

## 2015-07-14 DIAGNOSIS — R7989 Other specified abnormal findings of blood chemistry: Secondary | ICD-10-CM | POA: Diagnosis not present

## 2015-07-14 DIAGNOSIS — R109 Unspecified abdominal pain: Secondary | ICD-10-CM | POA: Diagnosis not present

## 2015-07-14 DIAGNOSIS — R7401 Elevation of levels of liver transaminase levels: Secondary | ICD-10-CM

## 2015-07-14 LAB — CBC
HCT: 43.1 % (ref 36.0–46.0)
Hemoglobin: 14.2 g/dL (ref 12.0–15.0)
MCH: 29 pg (ref 26.0–34.0)
MCHC: 32.9 g/dL (ref 30.0–36.0)
MCV: 88 fL (ref 78.0–100.0)
Platelets: 266 10*3/uL (ref 150–400)
RBC: 4.9 MIL/uL (ref 3.87–5.11)
RDW: 15.2 % (ref 11.5–15.5)
WBC: 8.7 10*3/uL (ref 4.0–10.5)

## 2015-07-14 LAB — COMPREHENSIVE METABOLIC PANEL
ALT: 677 U/L — ABNORMAL HIGH (ref 14–54)
AST: 211 U/L — ABNORMAL HIGH (ref 15–41)
Albumin: 3.7 g/dL (ref 3.5–5.0)
Alkaline Phosphatase: 135 U/L — ABNORMAL HIGH (ref 38–126)
Anion gap: 7 (ref 5–15)
BUN: 14 mg/dL (ref 6–20)
CO2: 27 mmol/L (ref 22–32)
Calcium: 9.2 mg/dL (ref 8.9–10.3)
Chloride: 105 mmol/L (ref 101–111)
Creatinine, Ser: 0.75 mg/dL (ref 0.44–1.00)
GFR calc Af Amer: >60 mL/min (ref >60)
GFR calc non Af Amer: >60 mL/min (ref >60)
Glucose, Bld: 94 mg/dL (ref 65–99)
Potassium: 4.2 mmol/L (ref 3.5–5.1)
Sodium: 139 mmol/L (ref 135–145)
Total Bilirubin: 0.5 mg/dL (ref 0.3–1.2)
Total Protein: 6.7 g/dL (ref 6.5–8.1)

## 2015-07-14 LAB — LIPASE, BLOOD: Lipase: 40 U/L (ref 11–51)

## 2015-07-14 LAB — POC URINE PREG, ED: Preg Test, Ur: NEGATIVE

## 2015-07-14 MED ORDER — MORPHINE SULFATE (PF) 4 MG/ML IV SOLN
4.0000 mg | Freq: Once | INTRAVENOUS | Status: AC
  Administered 2015-07-15: 4 mg via INTRAVENOUS
  Filled 2015-07-14: qty 1

## 2015-07-14 MED ORDER — SODIUM CHLORIDE 0.9 % IV BOLUS (SEPSIS)
1000.0000 mL | Freq: Once | INTRAVENOUS | Status: AC
  Administered 2015-07-15: 1000 mL via INTRAVENOUS

## 2015-07-14 MED ORDER — ONDANSETRON HCL 4 MG/2ML IJ SOLN
4.0000 mg | Freq: Once | INTRAMUSCULAR | Status: AC
  Administered 2015-07-15: 4 mg via INTRAVENOUS
  Filled 2015-07-14: qty 2

## 2015-07-14 NOTE — ED Provider Notes (Signed)
CSN: [id number]     Arrival date & time 07/14/15  2059 History  By signing my name below, I, [name], attest that this documentation has been prepared under the direction and in the presence of Claudia Greek, MD. Electronically Signed: Sonum Kim, Education administrator. 07/15/2015. 2:37 AM   Chief Complaint  Patient presents with  . Abdominal Pain  . Back Pain   The history is provided by the patient. No language interpreter was used.     HPI Comments: [name] is a 44 y.o. female who presents to the Emergency Department complaining of constant, moderate to severe, gradually worsening lower abdominal pain with associated lower back pain and mild vaginal bleeding that began about 16 hours ago. She describes her pain as cramping but states it is more severe than menstrual pain. She denies similar episodes in the past. She reports a history of a tubal ligation and prior back surgeries. She denies fever.    Past Medical History  Diagnosis Date  . Depression   . Thyroid disease   . Rosacea   . Chronic back pain greater than 3 months duration   . Dysuria     has to have self in and out cath   . ADHD (attention deficit hyperactivity disorder)   . Anxiety    Past Surgical History  Procedure Laterality Date  . Back surgery    . Eye surgery    . Incontinence surgery    . Spinal fusion  2008  . Bladder mesh  2009   Family History  Problem Relation Age of Onset  . Schizophrenia Father   . Schizophrenia Cousin    Social History  Substance Use Topics  . Smoking status: Current Every Day Smoker -- 1.00 packs/day    Types: Cigarettes  . Smokeless tobacco: Never Used  . Alcohol Use: Yes     Comment: pt states "When I run out of medicine"   OB History    No data available     Review of Systems  Constitutional: Negative for fever.  Gastrointestinal: Positive for abdominal pain.  Genitourinary: Positive for vaginal bleeding.  Musculoskeletal: Positive for back pain.  All other  systems reviewed and are negative.     Allergies  Pregabalin; Other; Celebrex; Effexor; Prozac; Symbyax; and Wellbutrin  Home Medications   Prior to Admission medications   Medication Sig Start Date End Date Taking? Authorizing Provider  ALPRAZolam Duanne Moron) 1 MG tablet Take 1 tablet by mouth 3 (three) times daily as needed. anxiety 04/20/15  Yes Historical Provider, MD  ARIPiprazole (ABILIFY) 2 MG tablet Take 1 tablet (2 mg total) by mouth daily. 06/12/15  Yes Clarene Reamer, MD  aspirin 325 MG tablet Take 1 tablet (325 mg total) by mouth daily. Patient taking differently: Take 650 mg by mouth every 4 (four) hours as needed for mild pain.  04/17/15  Yes Niel Hummer, NP  busPIRone (BUSPAR) 7.5 MG tablet Take 1 tablet (7.5 mg total) by mouth 2 (two) times daily. 04/17/15  Yes Niel Hummer, NP  Chlorphen-Phenyleph-APAP (TYLENOL ALLERGY MULTI-SYMPTOM) 2-5-325 MG TABS Take 2 tablets by mouth every 4 (four) hours as needed (cold symptoms).   Yes Historical Provider, MD  DULoxetine (CYMBALTA) 60 MG capsule Take 2 capsules (120 mg total) by mouth daily. 04/17/15  Yes Niel Hummer, NP  etodolac (LODINE) 500 MG tablet Take 1 tablet by mouth 2 (two) times daily as needed. pain 04/23/15  Yes Historical Provider, MD  gabapentin (NEURONTIN)  300 MG capsule Take 3 capsules (900 mg total) by mouth at bedtime. 04/17/15  Yes Niel Hummer, NP  lamoTRIgine (LAMICTAL) 25 MG tablet Take 3 tablets (75 mg total) by mouth daily. 06/12/15  Yes Clarene Reamer, MD  levothyroxine (SYNTHROID, LEVOTHROID) 50 MCG tablet Take 1 tablet (50 mcg total) by mouth daily. 04/17/15  Yes Niel Hummer, NP  lidocaine (LIDODERM) 5 % Place 3 patches onto the skin daily. Remove & Discard patch within 12 hours or as directed by MD Patient taking differently: Place 3 patches onto the skin daily as needed (pain). Remove & Discard patch within 12 hours or as directed by MD 04/17/15  Yes Niel Hummer, NP  methylphenidate (RITALIN) 20 MG tablet  Take 1 tablet (20 mg total) by mouth 2 (two) times daily. Patient taking differently: Take 10-20 mg by mouth 2 (two) times daily as needed (for Adult ADD).  05/12/15  Yes Clarene Reamer, MD  oxyCODONE (OXY IR/ROXICODONE) 5 MG immediate release tablet Take 1 tablet by mouth every 6 (six) hours as needed. pain 07/11/15  Yes Historical Provider, MD  pantoprazole (PROTONIX) 20 MG tablet Take 1 tablet (20 mg total) by mouth daily. Patient taking differently: Take 20 mg by mouth daily as needed for heartburn or indigestion.  04/17/15  Yes Niel Hummer, NP  pravastatin (PRAVACHOL) 20 MG tablet Take 1 tablet (20 mg total) by mouth daily. 04/17/15  Yes Niel Hummer, NP  traZODone (DESYREL) 50 MG tablet Take 1 tablet (50 mg total) by mouth at bedtime and may repeat dose one time if needed. Patient taking differently: Take 50 mg by mouth at bedtime and may repeat dose one time if needed. sleep 04/17/15  Yes Niel Hummer, NP  cloNIDine (CATAPRES) 0.1 MG tablet Take 1 tablet (0.1 mg total) by mouth 2 (two) times daily. Patient not taking: Reported on 06/12/2015 04/30/15 04/29/16  Clarene Reamer, MD  gabapentin (NEURONTIN) 300 MG capsule Take 1 capsule (300 mg total) by mouth 3 (three) times daily. Patient not taking: Reported on 06/12/2015 04/17/15   Niel Hummer, NP  hydrOXYzine (ATARAX/VISTARIL) 50 MG tablet Take 1 tablet (50 mg total) by mouth every 6 (six) hours as needed for anxiety. Patient not taking: Reported on 06/12/2015 04/17/15   Niel Hummer, NP  methocarbamol (ROBAXIN) 750 MG tablet Take 1 tablet (750 mg total) by mouth every 8 (eight) hours as needed for muscle spasms. Patient not taking: Reported on 07/14/2015 04/17/15   Niel Hummer, NP  nicotine (NICODERM CQ - DOSED IN MG/24 HOURS) 21 mg/24hr patch Place 1 patch (21 mg total) onto the skin daily. Patient not taking: Reported on 06/12/2015 04/17/15   Niel Hummer, NP   BP 119/79 mmHg  Pulse 84  Temp(Src) 98.1 F (36.7 C) (Oral)  Resp 20  Ht 5\' 4"   (1.626 m)  Wt 185 lb (83.915 kg)  BMI 31.74 kg/m2  SpO2 100% Physical Exam  Constitutional: She is oriented to person, place, and time. She appears well-developed and well-nourished. No distress.  HENT:  Head: Normocephalic and atraumatic.  Right Ear: Hearing normal.  Left Ear: Hearing normal.  Nose: Nose normal.  Mouth/Throat: Oropharynx is clear and moist and mucous membranes are normal.  Eyes: Conjunctivae and EOM are normal. Pupils are equal, round, and reactive to light.  Neck: Normal range of motion. Neck supple.  Cardiovascular: Normal rate, regular rhythm, S1 normal and S2 normal.  Exam reveals no gallop and  no friction rub.   No murmur heard. Pulmonary/Chest: Effort normal and breath sounds normal. No respiratory distress. She exhibits no tenderness.  Abdominal: Soft. Normal appearance and bowel sounds are normal. There is no hepatosplenomegaly. There is tenderness. There is no rebound, no guarding, no tenderness at McBurney's point and negative Murphy's sign. No hernia.  Diffuse lower pelvic tenderness. No guarding or rebound  Musculoskeletal: Normal range of motion.  Neurological: She is alert and oriented to person, place, and time. She has normal strength. No cranial nerve deficit or sensory deficit. Coordination normal. GCS eye subscore is 4. GCS verbal subscore is 5. GCS motor subscore is 6.  Skin: Skin is warm, dry and intact. No rash noted. No cyanosis.  Psychiatric: She has a normal mood and affect. Her speech is normal and behavior is normal. Thought content normal.  Nursing note and vitals reviewed.   ED Course  Procedures (including critical care time)  DIAGNOSTIC STUDIES: Oxygen Saturation is 100% on RA, normal by my interpretation.    COORDINATION OF CARE: 11:31 PM Ordered pain medication, abdominal ultrasound, and labs. Discussed treatment plan with pt at bedside and pt agreed to plan.  2:33 AM Discussed lab and imaging results with the patient. Advised  patient to follow-up with liver specialist. Patient acknowledges and agrees to plan.   Labs Review Labs Reviewed  COMPREHENSIVE METABOLIC PANEL - Abnormal; Notable for the following:    AST 211 (*)    ALT 677 (*)    Alkaline Phosphatase 135 (*)    All other components within normal limits  LIPASE, BLOOD  CBC  URINALYSIS, ROUTINE W REFLEX MICROSCOPIC (NOT AT [facility])  POC URINE PREG, ED    Imaging Review No results found. I have personally reviewed and evaluated these images and lab results as part of my medical decision-making.   EKG Interpretation None      MDM   Final diagnoses:  None   transaminitis Abdominal pain Vaginal bleeding   Patient presents to the ER for evaluation of abdominal pain. Patient reports that she is having pain in her lower abdomen and low back. Pain waxes and wanes and feels like contractions. She reports that she has had some vaginal spotting in the last 1 or 2 days. She reports that she has a very irregular menstrual cycle, will usually does not have pain with her menstruation. She is concerned about a tubal pregnancy. She has had a previous bilateral tubal ligation.  Urine pregnancy was negative. CBC is normal and patient is afebrile. Her comprehensive metabolic panel does reveal elevated AST ALT and alkaline phosphatase, normal bilirubin. Ultrasound performed and does not show any acute abnormality. Reviewing the patient's records reveals that she does have history of chronic back pain, denies using excessive Tylenol. Tylenol level today was negative.  I did discuss the case with Dr. Michail Sermon, on call for gastroenterology. He did confirm that the patient can be treated and further evaluated in the office, does not require hospitalization. Infectious hepatitis panel has been sent.  Patient does have an Ob-GYN, will follow up.  Claudia Greek, MD 07/15/15 [phone]

## 2015-07-14 NOTE — ED Notes (Signed)
Pt was informed that a urine specimen is needed. 

## 2015-07-14 NOTE — ED Notes (Signed)
Patient says she is having abdominal pain. Patient states it feels like she is having a baby. She says she also having lower back pain and the pain is going down her legs.

## 2015-07-15 ENCOUNTER — Emergency Department (HOSPITAL_COMMUNITY): Payer: Medicare Other

## 2015-07-15 DIAGNOSIS — R109 Unspecified abdominal pain: Secondary | ICD-10-CM | POA: Diagnosis not present

## 2015-07-15 DIAGNOSIS — R7989 Other specified abnormal findings of blood chemistry: Secondary | ICD-10-CM | POA: Diagnosis not present

## 2015-07-15 LAB — URINALYSIS, ROUTINE W REFLEX MICROSCOPIC
Bilirubin Urine: NEGATIVE
Glucose, UA: NEGATIVE mg/dL
Ketones, ur: NEGATIVE mg/dL
Leukocytes, UA: NEGATIVE
Nitrite: NEGATIVE
Protein, ur: NEGATIVE mg/dL
Specific Gravity, Urine: 1.018 (ref 1.005–1.030)
Urobilinogen, UA: 1 mg/dL (ref 0.0–1.0)
pH: 6 (ref 5.0–8.0)

## 2015-07-15 LAB — ACETAMINOPHEN LEVEL: Acetaminophen (Tylenol), Serum: <10 ug/mL — ABNORMAL LOW (ref 10–30)

## 2015-07-15 LAB — URINE MICROSCOPIC-ADD ON

## 2015-07-15 MED ORDER — LORAZEPAM 2 MG/ML IJ SOLN
1.0000 mg | Freq: Once | INTRAMUSCULAR | Status: AC
  Administered 2015-07-15: 1 mg via INTRAVENOUS
  Filled 2015-07-15: qty 1

## 2015-07-15 NOTE — Discharge Instructions (Signed)
Abnormal Uterine Bleeding Abnormal uterine bleeding means bleeding from the vagina that is not your normal menstrual period. This can be:  Bleeding or spotting between periods.  Bleeding after sex (sexual intercourse).  Bleeding that is heavier or more than normal.  Periods that last longer than usual.  Bleeding after menopause. There are many problems that may cause this. Treatment will depend on the cause of the bleeding. Any kind of bleeding that is not normal should be reviewed by your doctor.  HOME CARE Watch your condition for any changes. These actions may lessen any discomfort you are having:  Do not use tampons or douches as told by your doctor.  Change your pads often. You should get regular pelvic exams and Pap tests. Keep all appointments for tests as told by your doctor. GET HELP IF:  You are bleeding for more than 1 week.  You feel dizzy at times. GET HELP RIGHT AWAY IF:   You pass out.  You have to change pads every 15 to 30 minutes.  You have belly pain.  You have a fever.  You become sweaty or weak.  You are passing large blood clots from the vagina.  You feel sick to your stomach (nauseous) and throw up (vomit). MAKE SURE YOU:  Understand these instructions.  Will watch your condition.  Will get help right away if you are not doing well or get worse.   This information is not intended to replace advice given to you by your health care provider. Make sure you discuss any questions you have with your health care provider.   Document Released: 06/19/2009 Document Revised: 08/27/2013 Document Reviewed: 03/21/2013 Elsevier Interactive Patient Education 2016 [employer] Abdominal Pain, Adult Many things can cause abdominal pain. Usually, abdominal pain is not caused by a disease and will improve without treatment. It can often be observed and treated at home. Your health care provider will do a physical exam and possibly order blood tests and X-rays  to help determine the seriousness of your pain. However, in many cases, more time must pass before a clear cause of the pain can be found. Before that point, your health care provider may not know if you need more testing or further treatment. HOME CARE INSTRUCTIONS Monitor your abdominal pain for any changes. The following actions may help to alleviate any discomfort you are experiencing:  Only take over-the-counter or prescription medicines as directed by your health care provider.  Do not take laxatives unless directed to do so by your health care provider.  Try a clear liquid diet (broth, tea, or water) as directed by your health care provider. Slowly move to a bland diet as tolerated. SEEK MEDICAL CARE IF:  You have unexplained abdominal pain.  You have abdominal pain associated with nausea or diarrhea.  You have pain when you urinate or have a bowel movement.  You experience abdominal pain that wakes you in the night.  You have abdominal pain that is worsened or improved by eating food.  You have abdominal pain that is worsened with eating fatty foods.  You have a fever. SEEK IMMEDIATE MEDICAL CARE IF:  Your pain does not go away within 2 hours.  You keep throwing up (vomiting).  Your pain is felt only in portions of the abdomen, such as the right side or the left lower portion of the abdomen.  You pass bloody or black tarry stools. MAKE SURE YOU:  Understand these instructions.  Will watch your condition.  Will  get help right away if you are not doing well or get worse.   This information is not intended to replace advice given to you by your health care provider. Make sure you discuss any questions you have with your health care provider.   Document Released: 06/01/2005 Document Revised: 05/13/2015 Document Reviewed: 05/01/2013 Elsevier Interactive Patient Education [employer].

## 2015-07-16 LAB — HEPATITIS PANEL, ACUTE
HCV Ab: <0.1 s/co ratio (ref 0.0–0.9)
Hep A IgM: NEGATIVE
Hep B C IgM: UNDETERMINED
Hepatitis B Surface Ag: NEGATIVE

## 2015-07-20 ENCOUNTER — Ambulatory Visit (HOSPITAL_COMMUNITY): Payer: Self-pay | Admitting: Psychiatry

## 2015-07-21 ENCOUNTER — Encounter (HOSPITAL_COMMUNITY): Payer: Self-pay

## 2015-07-21 ENCOUNTER — Emergency Department (HOSPITAL_COMMUNITY)
Admission: EM | Admit: 2015-07-21 | Discharge: 2015-07-21 | Disposition: A | Discharge status: Home / Self Care | Payer: Medicare Other | Attending: Emergency Medicine | Admitting: Emergency Medicine

## 2015-07-21 DIAGNOSIS — R1084 Generalized abdominal pain: Secondary | ICD-10-CM | POA: Diagnosis present

## 2015-07-21 DIAGNOSIS — Z7982 Long term (current) use of aspirin: Secondary | ICD-10-CM | POA: Insufficient documentation

## 2015-07-21 DIAGNOSIS — F419 Anxiety disorder, unspecified: Secondary | ICD-10-CM | POA: Insufficient documentation

## 2015-07-21 DIAGNOSIS — R109 Unspecified abdominal pain: Secondary | ICD-10-CM

## 2015-07-21 DIAGNOSIS — E079 Disorder of thyroid, unspecified: Secondary | ICD-10-CM | POA: Insufficient documentation

## 2015-07-21 DIAGNOSIS — Z79899 Other long term (current) drug therapy: Secondary | ICD-10-CM | POA: Insufficient documentation

## 2015-07-21 DIAGNOSIS — Z872 Personal history of diseases of the skin and subcutaneous tissue: Secondary | ICD-10-CM | POA: Diagnosis not present

## 2015-07-21 DIAGNOSIS — Z3202 Encounter for pregnancy test, result negative: Secondary | ICD-10-CM | POA: Insufficient documentation

## 2015-07-21 DIAGNOSIS — F909 Attention-deficit hyperactivity disorder, unspecified type: Secondary | ICD-10-CM | POA: Diagnosis not present

## 2015-07-21 DIAGNOSIS — F17210 Nicotine dependence, cigarettes, uncomplicated: Secondary | ICD-10-CM | POA: Insufficient documentation

## 2015-07-21 DIAGNOSIS — G8929 Other chronic pain: Secondary | ICD-10-CM | POA: Diagnosis not present

## 2015-07-21 DIAGNOSIS — F329 Major depressive disorder, single episode, unspecified: Secondary | ICD-10-CM | POA: Insufficient documentation

## 2015-07-21 LAB — URINALYSIS, ROUTINE W REFLEX MICROSCOPIC
Bilirubin Urine: NEGATIVE
Glucose, UA: NEGATIVE mg/dL
Hgb urine dipstick: NEGATIVE
Ketones, ur: NEGATIVE mg/dL
Leukocytes, UA: NEGATIVE
Nitrite: NEGATIVE
Protein, ur: NEGATIVE mg/dL
Specific Gravity, Urine: 1.018 (ref 1.005–1.030)
pH: 7 (ref 5.0–8.0)

## 2015-07-21 LAB — COMPREHENSIVE METABOLIC PANEL
ALT: 587 U/L — ABNORMAL HIGH (ref 14–54)
AST: 164 U/L — ABNORMAL HIGH (ref 15–41)
Albumin: 3.4 g/dL — ABNORMAL LOW (ref 3.5–5.0)
Alkaline Phosphatase: 126 U/L (ref 38–126)
Anion gap: 4 — ABNORMAL LOW (ref 5–15)
BUN: 12 mg/dL (ref 6–20)
CO2: 27 mmol/L (ref 22–32)
Calcium: 9 mg/dL (ref 8.9–10.3)
Chloride: 107 mmol/L (ref 101–111)
Creatinine, Ser: 0.64 mg/dL (ref 0.44–1.00)
GFR calc Af Amer: >60 mL/min (ref >60)
GFR calc non Af Amer: >60 mL/min (ref >60)
Glucose, Bld: 81 mg/dL (ref 65–99)
Potassium: 3.9 mmol/L (ref 3.5–5.1)
Sodium: 138 mmol/L (ref 135–145)
Total Bilirubin: 0.2 mg/dL — ABNORMAL LOW (ref 0.3–1.2)
Total Protein: 6.4 g/dL — ABNORMAL LOW (ref 6.5–8.1)

## 2015-07-21 LAB — CBC
HCT: 39.4 % (ref 36.0–46.0)
Hemoglobin: 12.8 g/dL (ref 12.0–15.0)
MCH: 28.4 pg (ref 26.0–34.0)
MCHC: 32.5 g/dL (ref 30.0–36.0)
MCV: 87.4 fL (ref 78.0–100.0)
Platelets: 207 10*3/uL (ref 150–400)
RBC: 4.51 MIL/uL (ref 3.87–5.11)
RDW: 14.7 % (ref 11.5–15.5)
WBC: 7.1 10*3/uL (ref 4.0–10.5)

## 2015-07-21 LAB — I-STAT BETA HCG BLOOD, ED (MC, WL, AP ONLY): I-stat hCG, quantitative: <5 m[IU]/mL (ref <5)

## 2015-07-21 LAB — LIPASE, BLOOD: Lipase: 28 U/L (ref 11–51)

## 2015-07-21 MED ORDER — MORPHINE SULFATE (PF) 4 MG/ML IV SOLN
6.0000 mg | Freq: Once | INTRAVENOUS | Status: AC
  Administered 2015-07-21: 6 mg via INTRAVENOUS
  Filled 2015-07-21: qty 2

## 2015-07-21 MED ORDER — OXYCODONE HCL 5 MG PO TABS: 5.0000 mg | ORAL_TABLET | ORAL | Status: DC | PRN

## 2015-07-21 MED ORDER — HYDROMORPHONE HCL 1 MG/ML IJ SOLN
1.0000 mg | Freq: Once | INTRAMUSCULAR | Status: AC
  Administered 2015-07-21: 1 mg via INTRAVENOUS
  Filled 2015-07-21: qty 1

## 2015-07-21 NOTE — ED Notes (Signed)
Pt made aware we need a urine sample. Said she would call when she needed to go.

## 2015-07-21 NOTE — Discharge Instructions (Signed)

## 2015-07-21 NOTE — ED Provider Notes (Signed)
CSN: [id number]     Arrival date & time 07/21/15  1139 History   First MD Initiated Contact with Patient 07/21/15 1217     Chief Complaint  Patient presents with  . Abdominal Pain     (Consider location/radiation/quality/duration/timing/severity/associated sxs/prior Treatment) HPI Comments: Patient here because of worsening chronic abdominal pain that is characterized as diffuse in nature. Was seen here 2 days ago on the chart was reviewed and patient patient had a negative evaluation at that time with exception of elevated liver function test. She has been given referral to gastroenterology but cannot be seen for several weeks. She endorses continuous nondescript diffuse pain not associated with fever, chills, vomiting, diarrhea. Denies any excessive use of Tylenol or recent drug use No association with food. She is not currently taking medication for this. Denies any vaginal complaints.  Patient is a 44 y.o. female presenting with abdominal pain. The history is provided by the patient.  Abdominal Pain   Past Medical History  Diagnosis Date  . Depression   . Thyroid disease   . Rosacea   . Chronic back pain greater than 3 months duration   . Dysuria     has to have self in and out cath   . ADHD (attention deficit hyperactivity disorder)   . Anxiety    Past Surgical History  Procedure Laterality Date  . Back surgery    . Eye surgery    . Incontinence surgery    . Spinal fusion  2008  . Bladder mesh  2009   Family History  Problem Relation Age of Onset  . Schizophrenia Father   . Schizophrenia Cousin    Social History  Substance Use Topics  . Smoking status: Current Every Day Smoker -- 1.00 packs/day    Types: Cigarettes  . Smokeless tobacco: Never Used  . Alcohol Use: No   OB History    No data available     Review of Systems  Gastrointestinal: Positive for abdominal pain.  All other systems reviewed and are negative.     Allergies  Pregabalin; Other;  Celebrex; Effexor; Pristiq; Prozac; Symbyax; and Wellbutrin  Home Medications   Prior to Admission medications   Medication Sig Start Date End Date Taking? Authorizing Provider  ALPRAZolam Duanne Moron) 1 MG tablet Take 1 tablet by mouth 3 (three) times daily as needed. anxiety 04/20/15  Yes Historical Provider, MD  ARIPiprazole (ABILIFY) 2 MG tablet Take 1 tablet (2 mg total) by mouth daily. 06/12/15  Yes Clarene Reamer, MD  aspirin 325 MG tablet Take 1 tablet (325 mg total) by mouth daily. Patient taking differently: Take 650 mg by mouth every 4 (four) hours as needed for mild pain.  04/17/15  Yes Niel Hummer, NP  busPIRone (BUSPAR) 7.5 MG tablet Take 1 tablet (7.5 mg total) by mouth 2 (two) times daily. 04/17/15  Yes Niel Hummer, NP  DULoxetine (CYMBALTA) 60 MG capsule Take 2 capsules (120 mg total) by mouth daily. 04/17/15  Yes Niel Hummer, NP  etodolac (LODINE) 500 MG tablet Take 1 tablet by mouth 2 (two) times daily as needed. pain 04/23/15  Yes Historical Provider, MD  gabapentin (NEURONTIN) 300 MG capsule Take 3 capsules (900 mg total) by mouth at bedtime. 04/17/15  Yes Niel Hummer, NP  lamoTRIgine (LAMICTAL) 25 MG tablet Take 3 tablets (75 mg total) by mouth daily. 06/12/15  Yes Clarene Reamer, MD  levothyroxine (SYNTHROID, LEVOTHROID) 50 MCG tablet Take 1 tablet (50 mcg total)  by mouth daily. 04/17/15  Yes Niel Hummer, NP  lidocaine (LIDODERM) 5 % Place 3 patches onto the skin daily. Remove & Discard patch within 12 hours or as directed by MD Patient taking differently: Place 3 patches onto the skin daily as needed (pain). Remove & Discard patch within 12 hours or as directed by MD 04/17/15  Yes Niel Hummer, NP  methylphenidate (RITALIN) 20 MG tablet Take 1 tablet (20 mg total) by mouth 2 (two) times daily. Patient taking differently: Take 10-20 mg by mouth 2 (two) times daily as needed (for Adult ADD).  05/12/15  Yes Clarene Reamer, MD  pantoprazole (PROTONIX) 20 MG tablet Take 1 tablet  (20 mg total) by mouth daily. Patient taking differently: Take 20 mg by mouth daily as needed for heartburn or indigestion.  04/17/15  Yes Niel Hummer, NP  pravastatin (PRAVACHOL) 20 MG tablet Take 1 tablet (20 mg total) by mouth daily. 04/17/15  Yes Niel Hummer, NP  cloNIDine (CATAPRES) 0.1 MG tablet Take 1 tablet (0.1 mg total) by mouth 2 (two) times daily. Patient not taking: Reported on 06/12/2015 04/30/15 04/29/16  Clarene Reamer, MD  gabapentin (NEURONTIN) 300 MG capsule Take 1 capsule (300 mg total) by mouth 3 (three) times daily. Patient not taking: Reported on 06/12/2015 04/17/15   Niel Hummer, NP  hydrOXYzine (ATARAX/VISTARIL) 50 MG tablet Take 1 tablet (50 mg total) by mouth every 6 (six) hours as needed for anxiety. Patient not taking: Reported on 06/12/2015 04/17/15   Niel Hummer, NP  methocarbamol (ROBAXIN) 750 MG tablet Take 1 tablet (750 mg total) by mouth every 8 (eight) hours as needed for muscle spasms. Patient not taking: Reported on 07/14/2015 04/17/15   Niel Hummer, NP  nicotine (NICODERM CQ - DOSED IN MG/24 HOURS) 21 mg/24hr patch Place 1 patch (21 mg total) onto the skin daily. Patient not taking: Reported on 06/12/2015 04/17/15   Niel Hummer, NP  traZODone (DESYREL) 50 MG tablet Take 1 tablet (50 mg total) by mouth at bedtime and may repeat dose one time if needed. Patient not taking: Reported on 07/21/2015 04/17/15   Niel Hummer, NP   BP 112/63 mmHg  Pulse 99  Temp(Src) 98.3 F (36.8 C) (Oral)  Resp 18  SpO2 98%  LMP 07/18/2015 Physical Exam  Constitutional: She is oriented to person, place, and time. She appears well-developed and well-nourished.  Non-toxic appearance. No distress.  HENT:  Head: Normocephalic and atraumatic.  Eyes: Conjunctivae, EOM and lids are normal. Pupils are equal, round, and reactive to light.  Neck: Normal range of motion. Neck supple. No tracheal deviation present. No thyroid mass present.  Cardiovascular: Normal rate, regular rhythm  and normal heart sounds.  Exam reveals no gallop.   No murmur heard. Pulmonary/Chest: Effort normal and breath sounds normal. No stridor. No respiratory distress. She has no decreased breath sounds. She has no wheezes. She has no rhonchi. She has no rales.  Abdominal: Soft. Normal appearance and bowel sounds are normal. She exhibits no distension. There is generalized tenderness. There is no rigidity, no rebound, no guarding and no CVA tenderness.  Musculoskeletal: Normal range of motion. She exhibits no edema or tenderness.  Neurological: She is alert and oriented to person, place, and time. She has normal strength. No cranial nerve deficit or sensory deficit. GCS eye subscore is 4. GCS verbal subscore is 5. GCS motor subscore is 6.  Skin: Skin is warm and dry. No abrasion  and no rash noted.  Psychiatric: She has a normal mood and affect. Her speech is normal and behavior is normal.  Nursing note and vitals reviewed.   ED Course  Procedures (including critical care time) Labs Review Labs Reviewed  CBC  URINALYSIS, ROUTINE W REFLEX MICROSCOPIC (NOT AT [facility])  LIPASE, BLOOD  COMPREHENSIVE METABOLIC PANEL  I-STAT BETA HCG BLOOD, ED ([facility], [facility], [facility] ONLY)    Imaging Review No results found. I have personally reviewed and evaluated these images and lab results as part of my medical decision-making.   EKG Interpretation None      MDM   Final diagnoses:  None    Patient given pain meds and feels better. Repeat liver function tests have improved. Patient encouraged to follow-up with her gastroenterologist    Lacretia Leigh, MD 07/21/15 [phone]

## 2015-07-21 NOTE — ED Notes (Signed)
Patient reports that she was seen a week ago for mid abdominal pain that radiates into the left lower back. and that she had abnormal lab work. Patient states the pain is worse today than before. Patient c/o nausea, but denies vomiting or diarrhea.

## 2015-07-22 ENCOUNTER — Telehealth (HOSPITAL_COMMUNITY): Payer: Self-pay | Admitting: Psychiatry

## 2015-07-22 DIAGNOSIS — F3130 Bipolar disorder, current episode depressed, mild or moderate severity, unspecified: Secondary | ICD-10-CM

## 2015-07-22 MED ORDER — METHYLPHENIDATE HCL 20 MG PO TABS: 20.0000 mg | ORAL_TABLET | Freq: Two times a day (BID) | ORAL | Status: DC

## 2015-07-22 MED ORDER — LAMOTRIGINE 25 MG PO TABS: 75.0000 mg | ORAL_TABLET | Freq: Every day | ORAL | Status: DC

## 2015-07-22 MED ORDER — ARIPIPRAZOLE 2 MG PO TABS: 2.0000 mg | ORAL_TABLET | Freq: Every day | ORAL | Status: DC

## 2015-07-22 NOTE — Telephone Encounter (Signed)
Refilled lamictal and aripiprazole and wrote prescription for methylphenidate

## 2015-08-17 ENCOUNTER — Ambulatory Visit (HOSPITAL_COMMUNITY): Payer: Self-pay | Admitting: Clinical

## 2015-08-18 DIAGNOSIS — K828 Other specified diseases of gallbladder: Secondary | ICD-10-CM | POA: Diagnosis not present

## 2015-08-18 DIAGNOSIS — R1011 Right upper quadrant pain: Secondary | ICD-10-CM | POA: Diagnosis not present

## 2015-08-18 DIAGNOSIS — R740 Nonspecific elevation of levels of transaminase and lactic acid dehydrogenase [LDH]: Secondary | ICD-10-CM | POA: Diagnosis not present

## 2015-08-18 DIAGNOSIS — F17210 Nicotine dependence, cigarettes, uncomplicated: Secondary | ICD-10-CM | POA: Diagnosis not present

## 2015-08-18 DIAGNOSIS — K5900 Constipation, unspecified: Secondary | ICD-10-CM | POA: Diagnosis not present

## 2015-08-18 DIAGNOSIS — K829 Disease of gallbladder, unspecified: Secondary | ICD-10-CM | POA: Diagnosis not present

## 2015-08-18 DIAGNOSIS — R110 Nausea: Secondary | ICD-10-CM | POA: Diagnosis not present

## 2015-08-19 DIAGNOSIS — R1011 Right upper quadrant pain: Secondary | ICD-10-CM | POA: Diagnosis not present

## 2015-08-19 DIAGNOSIS — R945 Abnormal results of liver function studies: Secondary | ICD-10-CM | POA: Diagnosis not present

## 2015-08-25 ENCOUNTER — Encounter (HOSPITAL_COMMUNITY): Payer: Self-pay | Admitting: *Deleted

## 2015-08-25 ENCOUNTER — Other Ambulatory Visit: Payer: Self-pay | Admitting: Gastroenterology

## 2015-08-25 DIAGNOSIS — R1011 Right upper quadrant pain: Secondary | ICD-10-CM | POA: Diagnosis not present

## 2015-08-25 DIAGNOSIS — R945 Abnormal results of liver function studies: Secondary | ICD-10-CM | POA: Diagnosis not present

## 2015-08-25 NOTE — Addendum Note (Signed)
Addended by: [name] on: 08/25/2015 01:13 PM   Modules accepted: Orders

## 2015-09-09 ENCOUNTER — Encounter (HOSPITAL_COMMUNITY)
Admission: RE | Disposition: A | Discharge status: Home / Self Care | Payer: Self-pay | Source: Ambulatory Visit | Attending: Gastroenterology

## 2015-09-09 ENCOUNTER — Ambulatory Visit (HOSPITAL_COMMUNITY)
Admission: RE | Admit: 2015-09-09 | Discharge: 2015-09-09 | Disposition: A | Discharge status: Home / Self Care | Payer: PPO | Source: Ambulatory Visit | Attending: Gastroenterology | Admitting: Gastroenterology

## 2015-09-09 ENCOUNTER — Ambulatory Visit (HOSPITAL_COMMUNITY): Payer: PPO | Admitting: Certified Registered Nurse Anesthetist

## 2015-09-09 ENCOUNTER — Other Ambulatory Visit: Payer: Self-pay | Admitting: Gastroenterology

## 2015-09-09 ENCOUNTER — Encounter (HOSPITAL_COMMUNITY): Payer: Self-pay | Admitting: *Deleted

## 2015-09-09 DIAGNOSIS — K449 Diaphragmatic hernia without obstruction or gangrene: Secondary | ICD-10-CM | POA: Insufficient documentation

## 2015-09-09 DIAGNOSIS — R945 Abnormal results of liver function studies: Secondary | ICD-10-CM | POA: Diagnosis not present

## 2015-09-09 DIAGNOSIS — R748 Abnormal levels of other serum enzymes: Secondary | ICD-10-CM | POA: Insufficient documentation

## 2015-09-09 DIAGNOSIS — F17210 Nicotine dependence, cigarettes, uncomplicated: Secondary | ICD-10-CM | POA: Diagnosis not present

## 2015-09-09 DIAGNOSIS — R1011 Right upper quadrant pain: Secondary | ICD-10-CM | POA: Diagnosis not present

## 2015-09-09 DIAGNOSIS — E039 Hypothyroidism, unspecified: Secondary | ICD-10-CM | POA: Insufficient documentation

## 2015-09-09 DIAGNOSIS — R197 Diarrhea, unspecified: Secondary | ICD-10-CM | POA: Insufficient documentation

## 2015-09-09 DIAGNOSIS — M1990 Unspecified osteoarthritis, unspecified site: Secondary | ICD-10-CM | POA: Insufficient documentation

## 2015-09-09 DIAGNOSIS — R932 Abnormal findings on diagnostic imaging of liver and biliary tract: Secondary | ICD-10-CM | POA: Diagnosis not present

## 2015-09-09 DIAGNOSIS — Z79899 Other long term (current) drug therapy: Secondary | ICD-10-CM | POA: Diagnosis not present

## 2015-09-09 DIAGNOSIS — K5900 Constipation, unspecified: Secondary | ICD-10-CM | POA: Insufficient documentation

## 2015-09-09 DIAGNOSIS — K219 Gastro-esophageal reflux disease without esophagitis: Secondary | ICD-10-CM | POA: Insufficient documentation

## 2015-09-09 DIAGNOSIS — K828 Other specified diseases of gallbladder: Secondary | ICD-10-CM | POA: Insufficient documentation

## 2015-09-09 HISTORY — PX: EUS: SHX5427

## 2015-09-09 SURGERY — UPPER ENDOSCOPIC ULTRASOUND (EUS) RADIAL
Anesthesia: Monitor Anesthesia Care

## 2015-09-09 MED ORDER — MIDAZOLAM HCL 2 MG/2ML IJ SOLN
INTRAMUSCULAR | Status: DC | PRN
  Administered 2015-09-09: 2 mg via INTRAVENOUS

## 2015-09-09 MED ORDER — SODIUM CHLORIDE 0.9 % IV SOLN: INTRAVENOUS | Status: DC

## 2015-09-09 MED ORDER — ONDANSETRON HCL 4 MG/2ML IJ SOLN
INTRAMUSCULAR | Status: AC
  Filled 2015-09-09: qty 2

## 2015-09-09 MED ORDER — LACTATED RINGERS IV SOLN: INTRAVENOUS | Status: DC

## 2015-09-09 MED ORDER — FENTANYL CITRATE (PF) 100 MCG/2ML IJ SOLN: 25.0000 ug | INTRAMUSCULAR | Status: DC | PRN

## 2015-09-09 MED ORDER — LACTATED RINGERS IV SOLN
INTRAVENOUS | Status: DC
  Administered 2015-09-09: 1000 mL via INTRAVENOUS

## 2015-09-09 MED ORDER — PROPOFOL 10 MG/ML IV BOLUS
INTRAVENOUS | Status: AC
  Filled 2015-09-09: qty 40

## 2015-09-09 MED ORDER — PROPOFOL 500 MG/50ML IV EMUL
INTRAVENOUS | Status: DC | PRN
  Administered 2015-09-09: 300 ug/kg/min via INTRAVENOUS

## 2015-09-09 MED ORDER — MIDAZOLAM HCL 2 MG/2ML IJ SOLN
INTRAMUSCULAR | Status: AC
  Filled 2015-09-09: qty 2

## 2015-09-09 NOTE — Transfer of Care (Signed)
Immediate Anesthesia Transfer of Care Note  Patient: [name]  Procedure(s) Performed: Procedure(s): UPPER ENDOSCOPIC ULTRASOUND (EUS) RADIAL (N/A)  Patient Location: PACU  Anesthesia Type:MAC  Level of Consciousness:  sedated, patient cooperative and responds to stimulation  Airway & Oxygen Therapy:Patient Spontanous Breathing and Patient connected to face mask oxgen  Post-op Assessment:  Report given to PACU RN and Post -op Vital signs reviewed and stable  Post vital signs:  Reviewed and stable  Last Vitals:  Filed Vitals:   09/09/15 0906  BP: 142/92  Pulse: 87  Temp: 36.8 C  Resp: 18    Complications: No apparent anesthesia complications

## 2015-09-09 NOTE — Discharge Instructions (Signed)

## 2015-09-09 NOTE — Op Note (Signed)
[facility] [place], [address]   ENDOSCOPIC ULTRASOUND PROCEDURE REPORT  PATIENT: [name], [name]  MR#: [id number] BIRTHDATE: Feb 05, 1971  GENDER: female ENDOSCOPIST: [name], MD REFERRED BY:  Gwenlyn Perking, M.D. PROCEDURE DATE:  09/09/2015 PROCEDURE:   Upper EUS ASA CLASS:      Class II INDICATIONS:   1.  elevated liver enzymes, right upper quadrant abdominal pain. MEDICATIONS: Monitored anesthesia care  DESCRIPTION OF PROCEDURE:   After the risks benefits and alternatives of the procedure were  explained, informed consent was obtained. The patient was then placed in the left, lateral, decubitus postion and IV sedation was administered. Throughout the procedure, the patients blood pressure, pulse and oxygen saturations were monitored continuously.  Under direct visualization, the forward-viewing radial echoendoscope was introduced through the mouth  and advanced to the second portion of the duodenum .  Water was used as necessary to provide an acoustic interface. Estimated blood loss is zero unless otherwise noted in this procedure report. Upon completion of the imaging, water was removed and the patient was sent to the recovery room in satisfactory condition.    FINDINGS:      EGD:  Limited views of upper GI tract (esophagus, stomach, duodenum) were obtained with forward-viewing EUS scope. Small hiatal hernia.  Otherwise normal limited exam to second portion of the duodenum; no ulcers seen. EUS:  Normal pancreatic parenchyma of the head, uncinate, genu, body and tail.  No chronic pancreatitis; no pancreatic mass or cyst. Bile duct non-dilated, no wall thickening, and no choledocholithiasis.  Subtle diffuse symmetrical wall thickening of the gallbladder; no pericholecystic fluid seen; no sludge or gallstones noted.  IMPRESSION:     As above.  Mild gallbladder wall thickening could be artifactual or representative of chronic  cholecystitis.  Clinical history, however, is quite compatible with biliary colic in setting of chronic cholecystitis.  RECOMMENDATIONS:     1.  Watch for potential complications of procedure. 2.  Will discuss with Deliah Goody, PA-C, our PA who originally saw patient in consultation. 3.  [facility] will follow.   _______________________________ Lorrin MaisArta Silence, MD 09/09/2015 10:16 AM   CC:

## 2015-09-09 NOTE — Anesthesia Postprocedure Evaluation (Signed)
Anesthesia Post Note  Patient: [name]  Procedure(s) Performed: Procedure(s) (LRB): UPPER ENDOSCOPIC ULTRASOUND (EUS) RADIAL (N/A)  Patient location during evaluation: PACU Anesthesia Type: MAC Level of consciousness: awake and alert Pain management: pain level controlled Vital Signs Assessment: post-procedure vital signs reviewed and stable Respiratory status: spontaneous breathing, nonlabored ventilation, respiratory function stable and patient connected to nasal cannula oxygen Cardiovascular status: blood pressure returned to baseline and stable Postop Assessment: no signs of nausea or vomiting Anesthetic complications: no    Last Vitals:  Filed Vitals:   09/09/15 1030 09/09/15 1040  BP: 124/7 149/90  Pulse: 74 68  Temp:    Resp: 19 16    Last Pain: There were no vitals filed for this visit.               Paolo Okane L

## 2015-09-09 NOTE — H&P (Signed)
Patient interval history reviewed.  Patient examined again.  There has been no change from documented H/P dated 08/19/15 (scanned into chart from our office) except as documented above.  Assessment:  1.  Elevated liver enzymes. 2.  Right upper quadrant abdominal pain.  Plan:  1.  Endoscopic ultrasound. 2.  Risks (bleeding, infection, bowel perforation that could require surgery, sedation-related changes in cardiopulmonary systems), benefits (identification and possible treatment of source of symptoms, exclusion of certain causes of symptoms), and alternatives (watchful waiting, radiographic imaging studies, empiric medical treatment) of upper endoscopy with ultrasound (EUS) were explained to patient/family in detail and patient wishes to proceed.

## 2015-09-09 NOTE — Addendum Note (Signed)
Addended by: [name] on: 09/09/2015 10:22 AM   Modules accepted: Orders

## 2015-09-09 NOTE — Anesthesia Preprocedure Evaluation (Signed)
Anesthesia Evaluation  Patient identified by MRN, date of birth, ID band Patient awake    Reviewed: Allergy & Precautions, H&P , NPO status , Patient's Chart, lab work & pertinent test results  Airway Mallampati: II  TM Distance: >3 FB Neck ROM: full    Dental no notable dental hx. (+) Dental Advisory Given, Teeth Intact   Pulmonary neg pulmonary ROS, Current Smoker,    Pulmonary exam normal breath sounds clear to auscultation       Cardiovascular Exercise Tolerance: Good negative cardio ROS Normal cardiovascular exam Rhythm:regular Rate:Normal     Neuro/Psych Anxiety Depression Bipolar Disorder negative neurological ROS  negative psych ROS   GI/Hepatic negative GI ROS, Neg liver ROS,   Endo/Other  negative endocrine ROSHypothyroidism   Renal/GU negative Renal ROS  negative genitourinary   Musculoskeletal   Abdominal   Peds  Hematology negative hematology ROS (+)   Anesthesia Other Findings   Reproductive/Obstetrics negative OB ROS                             Anesthesia Physical Anesthesia Plan  ASA: II  Anesthesia Plan: MAC   Post-op Pain Management:    Induction:   Airway Management Planned:   Additional Equipment:   Intra-op Plan:   Post-operative Plan:   Informed Consent: I have reviewed the patients History and Physical, chart, labs and discussed the procedure including the risks, benefits and alternatives for the proposed anesthesia with the patient or authorized representative who has indicated his/her understanding and acceptance.   Dental Advisory Given  Plan Discussed with: CRNA and Surgeon  Anesthesia Plan Comments:         Anesthesia Quick Evaluation

## 2015-09-10 ENCOUNTER — Encounter (HOSPITAL_COMMUNITY): Payer: Self-pay | Admitting: Gastroenterology

## 2015-09-16 DIAGNOSIS — Z79899 Other long term (current) drug therapy: Secondary | ICD-10-CM | POA: Diagnosis not present

## 2015-09-16 DIAGNOSIS — E559 Vitamin D deficiency, unspecified: Secondary | ICD-10-CM | POA: Diagnosis not present

## 2015-09-16 DIAGNOSIS — F1320 Sedative, hypnotic or anxiolytic dependence, uncomplicated: Secondary | ICD-10-CM | POA: Diagnosis not present

## 2015-09-16 DIAGNOSIS — F419 Anxiety disorder, unspecified: Secondary | ICD-10-CM | POA: Diagnosis not present

## 2015-09-16 DIAGNOSIS — E039 Hypothyroidism, unspecified: Secondary | ICD-10-CM | POA: Diagnosis not present

## 2015-09-17 ENCOUNTER — Ambulatory Visit (HOSPITAL_COMMUNITY): Payer: Self-pay | Admitting: Psychiatry

## 2015-09-17 ENCOUNTER — Telehealth (HOSPITAL_COMMUNITY): Payer: Self-pay | Admitting: Psychiatry

## 2015-09-17 DIAGNOSIS — F3130 Bipolar disorder, current episode depressed, mild or moderate severity, unspecified: Secondary | ICD-10-CM

## 2015-09-17 MED ORDER — ARIPIPRAZOLE 5 MG PO TABS: 5.0000 mg | ORAL_TABLET | Freq: Every day | ORAL | Status: AC

## 2015-09-17 MED ORDER — METHYLPHENIDATE HCL 20 MG PO TABS: 20.0000 mg | ORAL_TABLET | Freq: Two times a day (BID) | ORAL | Status: DC

## 2015-09-17 MED ORDER — LAMOTRIGINE 100 MG PO TABS: 100.0000 mg | ORAL_TABLET | Freq: Every day | ORAL | Status: DC

## 2015-09-17 MED ORDER — GABAPENTIN 300 MG PO CAPS: 900.0000 mg | ORAL_CAPSULE | Freq: Every day | ORAL | Status: DC

## 2015-09-17 MED ORDER — ALPRAZOLAM 1 MG PO TABS: 1.0000 mg | ORAL_TABLET | Freq: Three times a day (TID) | ORAL | Status: DC | PRN

## 2015-09-17 NOTE — Telephone Encounter (Signed)
Came in for her appointment for initial evaluation for outpatient  But was late and this was her third missed appointment so she will have to seek therapy elsewhere.  Ms Carlis Abbott made appointments with Dr Marquis Buggy office for her follow up and I refilled her prescriptions for methylphenidate and 2 additional refills, Increased lamotrigene to 100 mg with 2 refills, increased aripiprazole to 5 mg with 2 refills.  Refilled gabapentin 900 mg hs which she has taken for nerve pain but I write for anxiety.

## 2015-09-28 ENCOUNTER — Ambulatory Visit (HOSPITAL_COMMUNITY): Payer: Self-pay | Admitting: Clinical

## 2015-09-28 DIAGNOSIS — R1011 Right upper quadrant pain: Secondary | ICD-10-CM | POA: Diagnosis not present

## 2015-09-28 DIAGNOSIS — R109 Unspecified abdominal pain: Secondary | ICD-10-CM | POA: Diagnosis not present

## 2015-10-07 DIAGNOSIS — F429 Obsessive-compulsive disorder, unspecified: Secondary | ICD-10-CM | POA: Diagnosis not present

## 2015-10-07 DIAGNOSIS — F3132 Bipolar disorder, current episode depressed, moderate: Secondary | ICD-10-CM | POA: Diagnosis not present

## 2015-10-07 DIAGNOSIS — F411 Generalized anxiety disorder: Secondary | ICD-10-CM | POA: Diagnosis not present

## 2015-10-08 ENCOUNTER — Other Ambulatory Visit (HOSPITAL_COMMUNITY): Payer: Self-pay | Admitting: Gastroenterology

## 2015-10-08 DIAGNOSIS — B1920 Unspecified viral hepatitis C without hepatic coma: Secondary | ICD-10-CM | POA: Diagnosis not present

## 2015-10-08 DIAGNOSIS — R945 Abnormal results of liver function studies: Secondary | ICD-10-CM | POA: Diagnosis not present

## 2015-10-08 DIAGNOSIS — R1011 Right upper quadrant pain: Secondary | ICD-10-CM

## 2015-10-15 ENCOUNTER — Encounter (HOSPITAL_COMMUNITY): Payer: PPO

## 2015-10-22 DIAGNOSIS — R748 Abnormal levels of other serum enzymes: Secondary | ICD-10-CM | POA: Insufficient documentation

## 2015-10-23 DIAGNOSIS — F410 Panic disorder [episodic paroxysmal anxiety] without agoraphobia: Secondary | ICD-10-CM | POA: Diagnosis not present

## 2015-10-23 DIAGNOSIS — R748 Abnormal levels of other serum enzymes: Secondary | ICD-10-CM | POA: Diagnosis not present

## 2015-10-23 DIAGNOSIS — G894 Chronic pain syndrome: Secondary | ICD-10-CM | POA: Diagnosis not present

## 2015-10-23 DIAGNOSIS — G2581 Restless legs syndrome: Secondary | ICD-10-CM | POA: Diagnosis not present

## 2015-10-23 DIAGNOSIS — F329 Major depressive disorder, single episode, unspecified: Secondary | ICD-10-CM | POA: Diagnosis not present

## 2015-10-27 ENCOUNTER — Ambulatory Visit (HOSPITAL_COMMUNITY)
Admission: RE | Admit: 2015-10-27 | Discharge: 2015-10-27 | Disposition: A | Discharge status: Home / Self Care | Payer: PPO | Source: Ambulatory Visit | Attending: Gastroenterology | Admitting: Gastroenterology

## 2015-10-27 DIAGNOSIS — R1011 Right upper quadrant pain: Secondary | ICD-10-CM

## 2015-10-27 MED ORDER — STERILE WATER FOR INJECTION IJ SOLN
5.0000 mL | Freq: Once | INTRAMUSCULAR | Status: AC
  Administered 2015-10-27: 5 mL via INTRAMUSCULAR

## 2015-10-27 MED ORDER — SINCALIDE 5 MCG IJ SOLR
INTRAMUSCULAR | Status: AC
  Administered 2015-10-27: 1.76 ug via INTRAVENOUS
  Filled 2015-10-27: qty 5

## 2015-10-27 MED ORDER — TECHNETIUM TC 99M MEBROFENIN IV KIT
5.2000 | PACK | Freq: Once | INTRAVENOUS | Status: AC | PRN
  Administered 2015-10-27: 5 via INTRAVENOUS

## 2015-10-27 MED ORDER — SINCALIDE 5 MCG IJ SOLR
0.0200 ug/kg | Freq: Once | INTRAMUSCULAR | Status: AC
  Administered 2015-10-27: 1.76 ug via INTRAVENOUS

## 2015-11-19 DIAGNOSIS — R1011 Right upper quadrant pain: Secondary | ICD-10-CM | POA: Diagnosis not present

## 2015-11-19 DIAGNOSIS — B1920 Unspecified viral hepatitis C without hepatic coma: Secondary | ICD-10-CM | POA: Diagnosis not present

## 2015-11-24 DIAGNOSIS — F429 Obsessive-compulsive disorder, unspecified: Secondary | ICD-10-CM | POA: Diagnosis not present

## 2015-11-24 DIAGNOSIS — F411 Generalized anxiety disorder: Secondary | ICD-10-CM | POA: Diagnosis not present

## 2015-11-24 DIAGNOSIS — F3132 Bipolar disorder, current episode depressed, moderate: Secondary | ICD-10-CM | POA: Diagnosis not present

## 2015-12-01 DIAGNOSIS — B1920 Unspecified viral hepatitis C without hepatic coma: Secondary | ICD-10-CM | POA: Insufficient documentation

## 2015-12-02 DIAGNOSIS — Z1231 Encounter for screening mammogram for malignant neoplasm of breast: Secondary | ICD-10-CM | POA: Diagnosis not present

## 2015-12-02 DIAGNOSIS — E039 Hypothyroidism, unspecified: Secondary | ICD-10-CM | POA: Diagnosis not present

## 2015-12-02 DIAGNOSIS — E785 Hyperlipidemia, unspecified: Secondary | ICD-10-CM | POA: Diagnosis not present

## 2015-12-02 DIAGNOSIS — Z1239 Encounter for other screening for malignant neoplasm of breast: Secondary | ICD-10-CM | POA: Diagnosis not present

## 2015-12-02 DIAGNOSIS — R748 Abnormal levels of other serum enzymes: Secondary | ICD-10-CM | POA: Diagnosis not present

## 2016-01-21 DIAGNOSIS — F3132 Bipolar disorder, current episode depressed, moderate: Secondary | ICD-10-CM | POA: Diagnosis not present

## 2016-01-21 DIAGNOSIS — F429 Obsessive-compulsive disorder, unspecified: Secondary | ICD-10-CM | POA: Diagnosis not present

## 2016-01-21 DIAGNOSIS — F411 Generalized anxiety disorder: Secondary | ICD-10-CM | POA: Diagnosis not present

## 2016-02-09 DIAGNOSIS — R748 Abnormal levels of other serum enzymes: Secondary | ICD-10-CM | POA: Diagnosis not present

## 2016-02-09 DIAGNOSIS — E785 Hyperlipidemia, unspecified: Secondary | ICD-10-CM | POA: Diagnosis not present

## 2016-02-09 DIAGNOSIS — Z202 Contact with and (suspected) exposure to infections with a predominantly sexual mode of transmission: Secondary | ICD-10-CM | POA: Diagnosis not present

## 2016-02-18 DIAGNOSIS — F429 Obsessive-compulsive disorder, unspecified: Secondary | ICD-10-CM | POA: Diagnosis not present

## 2016-02-18 DIAGNOSIS — F411 Generalized anxiety disorder: Secondary | ICD-10-CM | POA: Diagnosis not present

## 2016-02-18 DIAGNOSIS — F3132 Bipolar disorder, current episode depressed, moderate: Secondary | ICD-10-CM | POA: Diagnosis not present

## 2016-02-24 DIAGNOSIS — L710 Perioral dermatitis: Secondary | ICD-10-CM | POA: Diagnosis not present

## 2016-03-23 DIAGNOSIS — F411 Generalized anxiety disorder: Secondary | ICD-10-CM | POA: Diagnosis not present

## 2016-03-23 DIAGNOSIS — F429 Obsessive-compulsive disorder, unspecified: Secondary | ICD-10-CM | POA: Diagnosis not present

## 2016-03-23 DIAGNOSIS — F3132 Bipolar disorder, current episode depressed, moderate: Secondary | ICD-10-CM | POA: Diagnosis not present

## 2016-05-18 ENCOUNTER — Other Ambulatory Visit: Payer: Self-pay | Admitting: Internal Medicine

## 2016-05-18 DIAGNOSIS — Z1231 Encounter for screening mammogram for malignant neoplasm of breast: Secondary | ICD-10-CM

## 2016-05-26 DIAGNOSIS — F429 Obsessive-compulsive disorder, unspecified: Secondary | ICD-10-CM | POA: Diagnosis not present

## 2016-05-26 DIAGNOSIS — F3132 Bipolar disorder, current episode depressed, moderate: Secondary | ICD-10-CM | POA: Diagnosis not present

## 2016-05-26 DIAGNOSIS — F411 Generalized anxiety disorder: Secondary | ICD-10-CM | POA: Diagnosis not present

## 2016-05-31 ENCOUNTER — Ambulatory Visit
Admission: RE | Admit: 2016-05-31 | Discharge: 2016-05-31 | Disposition: A | Discharge status: Home / Self Care | Payer: PPO | Source: Ambulatory Visit | Attending: Internal Medicine | Admitting: Internal Medicine

## 2016-05-31 DIAGNOSIS — B1920 Unspecified viral hepatitis C without hepatic coma: Secondary | ICD-10-CM | POA: Diagnosis not present

## 2016-05-31 DIAGNOSIS — Z1231 Encounter for screening mammogram for malignant neoplasm of breast: Secondary | ICD-10-CM | POA: Diagnosis not present

## 2016-06-09 ENCOUNTER — Other Ambulatory Visit (HOSPITAL_COMMUNITY): Payer: Self-pay | Admitting: Gastroenterology

## 2016-06-09 DIAGNOSIS — B1920 Unspecified viral hepatitis C without hepatic coma: Secondary | ICD-10-CM

## 2016-06-10 DIAGNOSIS — E559 Vitamin D deficiency, unspecified: Secondary | ICD-10-CM | POA: Diagnosis not present

## 2016-06-10 DIAGNOSIS — E039 Hypothyroidism, unspecified: Secondary | ICD-10-CM | POA: Diagnosis not present

## 2016-06-10 DIAGNOSIS — Z0000 Encounter for general adult medical examination without abnormal findings: Secondary | ICD-10-CM | POA: Diagnosis not present

## 2016-06-10 DIAGNOSIS — R5383 Other fatigue: Secondary | ICD-10-CM | POA: Diagnosis not present

## 2016-06-17 DIAGNOSIS — Z0000 Encounter for general adult medical examination without abnormal findings: Secondary | ICD-10-CM | POA: Diagnosis not present

## 2016-06-17 DIAGNOSIS — B182 Chronic viral hepatitis C: Secondary | ICD-10-CM | POA: Diagnosis not present

## 2016-06-17 DIAGNOSIS — Z23 Encounter for immunization: Secondary | ICD-10-CM | POA: Diagnosis not present

## 2016-06-17 DIAGNOSIS — N939 Abnormal uterine and vaginal bleeding, unspecified: Secondary | ICD-10-CM | POA: Diagnosis not present

## 2016-06-27 DIAGNOSIS — B1920 Unspecified viral hepatitis C without hepatic coma: Secondary | ICD-10-CM | POA: Diagnosis not present

## 2016-07-12 DIAGNOSIS — B1920 Unspecified viral hepatitis C without hepatic coma: Secondary | ICD-10-CM | POA: Diagnosis not present

## 2016-07-14 ENCOUNTER — Other Ambulatory Visit: Payer: Self-pay | Admitting: Obstetrics & Gynecology

## 2016-07-14 DIAGNOSIS — N898 Other specified noninflammatory disorders of vagina: Secondary | ICD-10-CM | POA: Diagnosis not present

## 2016-07-14 DIAGNOSIS — Z124 Encounter for screening for malignant neoplasm of cervix: Secondary | ICD-10-CM | POA: Diagnosis not present

## 2016-07-18 LAB — CYTOLOGY - PAP

## 2016-07-19 ENCOUNTER — Ambulatory Visit (HOSPITAL_COMMUNITY)
Admission: RE | Admit: 2016-07-19 | Discharge: 2016-07-19 | Disposition: A | Discharge status: Home / Self Care | Payer: PPO | Source: Ambulatory Visit | Attending: Gastroenterology | Admitting: Gastroenterology

## 2016-07-19 ENCOUNTER — Encounter (HOSPITAL_COMMUNITY): Payer: Self-pay

## 2016-07-19 DIAGNOSIS — B1920 Unspecified viral hepatitis C without hepatic coma: Secondary | ICD-10-CM

## 2016-07-21 DIAGNOSIS — F411 Generalized anxiety disorder: Secondary | ICD-10-CM | POA: Diagnosis not present

## 2016-07-21 DIAGNOSIS — F3132 Bipolar disorder, current episode depressed, moderate: Secondary | ICD-10-CM | POA: Diagnosis not present

## 2016-07-21 DIAGNOSIS — F429 Obsessive-compulsive disorder, unspecified: Secondary | ICD-10-CM | POA: Diagnosis not present

## 2016-07-27 ENCOUNTER — Ambulatory Visit (HOSPITAL_COMMUNITY)
Admission: RE | Admit: 2016-07-27 | Discharge: 2016-07-27 | Disposition: A | Discharge status: Home / Self Care | Payer: PPO | Source: Ambulatory Visit | Attending: Gastroenterology | Admitting: Gastroenterology

## 2016-07-27 DIAGNOSIS — B1920 Unspecified viral hepatitis C without hepatic coma: Secondary | ICD-10-CM | POA: Insufficient documentation

## 2016-07-27 DIAGNOSIS — B182 Chronic viral hepatitis C: Secondary | ICD-10-CM | POA: Diagnosis not present

## 2016-07-27 DIAGNOSIS — R932 Abnormal findings on diagnostic imaging of liver and biliary tract: Secondary | ICD-10-CM | POA: Diagnosis not present

## 2016-08-02 DIAGNOSIS — Z23 Encounter for immunization: Secondary | ICD-10-CM | POA: Diagnosis not present

## 2016-08-09 NOTE — Patient Instructions (Addendum)
Your procedure is scheduled on:  Friday, Dec. 15, 2017  Enter through the [employer] [facility] at:  8:00 AM  Pick up the phone at the desk and dial [phone].  Call this number if you have problems the morning of surgery: [phone].  Remember: Do NOT eat food or drink after:  Midnight Thursday, Dec. 14, 2017  Take these medicines the morning of surgery with a SIP OF WATER:  Lamictal, Synthroid, Abilify, Gabapentin and  Xanax if needed  Stop ALL herbal medications at this time  DO NOT USE VAPOR WITH NICOTINE DAY OF SURGERY Do NOT wear jewelry (body piercing), metal hair clips/bobby pins, make-up, or nail polish. Do NOT wear lotions, powders, or perfumes.  You may wear deodorant. Do NOT shave for 48 hours prior to surgery. Do NOT bring valuables to the hospital. Contacts, dentures, or bridgework may not be worn into surgery.  Have a responsible adult drive you home and stay with you for 24 hours after your procedure

## 2016-08-10 ENCOUNTER — Encounter (HOSPITAL_COMMUNITY)
Admission: RE | Admit: 2016-08-10 | Discharge: 2016-08-10 | Disposition: A | Discharge status: Home / Self Care | Payer: PPO | Source: Ambulatory Visit | Attending: Obstetrics & Gynecology | Admitting: Obstetrics & Gynecology

## 2016-08-10 ENCOUNTER — Encounter (HOSPITAL_COMMUNITY): Payer: Self-pay

## 2016-08-10 DIAGNOSIS — Z01818 Encounter for other preprocedural examination: Secondary | ICD-10-CM | POA: Diagnosis not present

## 2016-08-10 HISTORY — DX: Hypothyroidism, unspecified: E03.9

## 2016-08-10 HISTORY — DX: Social phobia, unspecified: F40.10

## 2016-08-10 HISTORY — DX: Sciatica, unspecified side: M54.30

## 2016-08-10 HISTORY — DX: Erosion of other implanted mesh to organ or tissue, initial encounter: T83.718A

## 2016-08-10 HISTORY — DX: Inflammatory liver disease, unspecified: K75.9

## 2016-08-10 HISTORY — DX: Anemia, unspecified: D64.9

## 2016-08-10 HISTORY — DX: Bipolar disorder, unspecified: F31.9

## 2016-08-10 HISTORY — DX: Pain in right leg: M79.604

## 2016-08-10 HISTORY — DX: Pain in right leg: M79.605

## 2016-08-10 HISTORY — DX: Unspecified osteoarthritis, unspecified site: M19.90

## 2016-08-10 LAB — CBC
HCT: 39.1 % (ref 36.0–46.0)
Hemoglobin: 13 g/dL (ref 12.0–15.0)
MCH: 28.5 pg (ref 26.0–34.0)
MCHC: 33.2 g/dL (ref 30.0–36.0)
MCV: 85.7 fL (ref 78.0–100.0)
Platelets: 252 10*3/uL (ref 150–400)
RBC: 4.56 MIL/uL (ref 3.87–5.11)
RDW: 14.5 % (ref 11.5–15.5)
WBC: 8.9 10*3/uL (ref 4.0–10.5)

## 2016-08-11 DIAGNOSIS — N920 Excessive and frequent menstruation with regular cycle: Secondary | ICD-10-CM | POA: Diagnosis not present

## 2016-08-18 NOTE — H&P (Signed)
[name] is an 45 y.o. female. She presents for Novasure endometrial ablation for the treatment of heavy and irregular menses.  Pertinent Gynecological History: Menses: flow is excessive with use of 7 pads or tampons on heaviest days Bleeding: dysfunctional uterine bleeding Contraception: tubal ligation Last pap: normal Date: 07/2016 OB History: G3, P2     Past Medical History:  Diagnosis Date  . ADHD (attention deficit hyperactivity disorder)   . Anemia    with pregnancy  . Anxiety   . Arthritis   . Bipolar disorder ([place])   . Chronic back pain greater than 3 months duration   . Depression   . Dysuria    has to have self in and out cath   . Eroded bladder suspension mesh ([place])   . Hepatitis    C  . Hypothyroidism   . Leg pain, bilateral   . Rosacea   . Sciatic nerve pain   . Social anxiety disorder   . Thyroid disease     Past Surgical History:  Procedure Laterality Date  . BACK SURGERY     total of 6 back surgeries  . bladder mesh  2009  . EUS N/A 09/09/2015   Procedure: UPPER ENDOSCOPIC ULTRASOUND (EUS) RADIAL;  Surgeon: [name], MD;  Location: [facility];  Service: Endoscopy;  Laterality: N/A;  . EYE SURGERY    . INCONTINENCE SURGERY    . SPINAL FUSION  2008   times two    Family History  Problem Relation Age of Onset  . Schizophrenia Father   . Schizophrenia Cousin     Social History:  reports that she quit smoking about 6 months ago. Her smoking use included Cigarettes. She smoked 1.00 pack per day. She has never used smokeless tobacco. She reports that she does not drink alcohol or use drugs.  Allergies:  Allergies  Allergen Reactions  . Pregabalin Anaphylaxis  . Celebrex [Celecoxib] Nausea And Vomiting    Stomach pain  . Effexor [Venlafaxine Hcl] Other (See Comments)    Serotonin toxicity  . Mirapex [Pramipexole]     Mirapex- Worsening "body jerks"  . Pristiq [Desvenlafaxine] Other (See Comments)    Serotonin toxicity   . Prozac  [Fluoxetine Hcl] Swelling  . Symbyax [Olanzapine-Fluoxetine Hcl] Swelling  . Wellbutrin [Bupropion Hcl] Swelling    No prescriptions prior to admission.    Review of Systems  Constitutional: Negative.   HENT: Negative.   Eyes: Negative.   Respiratory: Negative.   Cardiovascular: Negative.   Genitourinary: Negative.   Skin: Negative.   Neurological: Negative.     Last menstrual period 08/04/2016. Physical Exam  Constitutional: She is oriented to person, place, and time. She appears well-developed and well-nourished.  HENT:  Head: Normocephalic.  Eyes: Pupils are equal, round, and reactive to light.  Neck: Normal range of motion. Neck supple. No thyromegaly present.  Cardiovascular: Normal rate and regular rhythm.   Respiratory: Effort normal and breath sounds normal.  Genitourinary: Vagina normal and uterus normal.  Musculoskeletal: Normal range of motion.  Neurological: She is alert and oriented to person, place, and time.  Skin: Skin is warm and dry.    Office ultrasound shows normal endometrial cavity on SIS.  Uterine size is 8x6 cm.  Endometrial thickness is 8mm.  Assessment/Plan: Endometrial sampling followed by endometrial ablation for menorrhagia.  Marcello Tuzzolino D 08/18/2016, 9:19 PM

## 2016-08-19 ENCOUNTER — Ambulatory Visit (HOSPITAL_COMMUNITY): Payer: PPO | Admitting: Anesthesiology

## 2016-08-19 ENCOUNTER — Encounter (HOSPITAL_COMMUNITY)
Admission: RE | Disposition: A | Discharge status: Home / Self Care | Payer: Self-pay | Source: Ambulatory Visit | Attending: Obstetrics & Gynecology

## 2016-08-19 ENCOUNTER — Encounter (HOSPITAL_COMMUNITY): Payer: Self-pay | Admitting: *Deleted

## 2016-08-19 ENCOUNTER — Ambulatory Visit (HOSPITAL_COMMUNITY)
Admission: RE | Admit: 2016-08-19 | Discharge: 2016-08-19 | Disposition: A | Discharge status: Home / Self Care | Payer: PPO | Source: Ambulatory Visit | Attending: Obstetrics & Gynecology | Admitting: Obstetrics & Gynecology

## 2016-08-19 DIAGNOSIS — E039 Hypothyroidism, unspecified: Secondary | ICD-10-CM | POA: Diagnosis not present

## 2016-08-19 DIAGNOSIS — F909 Attention-deficit hyperactivity disorder, unspecified type: Secondary | ICD-10-CM | POA: Insufficient documentation

## 2016-08-19 DIAGNOSIS — G8929 Other chronic pain: Secondary | ICD-10-CM | POA: Insufficient documentation

## 2016-08-19 DIAGNOSIS — M1990 Unspecified osteoarthritis, unspecified site: Secondary | ICD-10-CM | POA: Insufficient documentation

## 2016-08-19 DIAGNOSIS — N920 Excessive and frequent menstruation with regular cycle: Secondary | ICD-10-CM | POA: Diagnosis not present

## 2016-08-19 DIAGNOSIS — F319 Bipolar disorder, unspecified: Secondary | ICD-10-CM | POA: Diagnosis not present

## 2016-08-19 DIAGNOSIS — D649 Anemia, unspecified: Secondary | ICD-10-CM | POA: Insufficient documentation

## 2016-08-19 DIAGNOSIS — L719 Rosacea, unspecified: Secondary | ICD-10-CM | POA: Diagnosis not present

## 2016-08-19 DIAGNOSIS — N926 Irregular menstruation, unspecified: Secondary | ICD-10-CM | POA: Diagnosis not present

## 2016-08-19 DIAGNOSIS — Z87891 Personal history of nicotine dependence: Secondary | ICD-10-CM | POA: Insufficient documentation

## 2016-08-19 DIAGNOSIS — F419 Anxiety disorder, unspecified: Secondary | ICD-10-CM | POA: Insufficient documentation

## 2016-08-19 DIAGNOSIS — F418 Other specified anxiety disorders: Secondary | ICD-10-CM | POA: Diagnosis not present

## 2016-08-19 HISTORY — PX: DILITATION & CURRETTAGE/HYSTROSCOPY WITH NOVASURE ABLATION: SHX5568

## 2016-08-19 SURGERY — DILATATION & CURETTAGE/HYSTEROSCOPY WITH NOVASURE ABLATION
Anesthesia: General

## 2016-08-19 MED ORDER — LIDOCAINE HCL (CARDIAC) 20 MG/ML IV SOLN
INTRAVENOUS | Status: AC
  Filled 2016-08-19: qty 5

## 2016-08-19 MED ORDER — KETOROLAC TROMETHAMINE 30 MG/ML IJ SOLN
INTRAMUSCULAR | Status: DC | PRN
Start: 2016-08-19 — End: 2016-08-19
  Administered 2016-08-19: 30 mg via INTRAVENOUS

## 2016-08-19 MED ORDER — LIDOCAINE HCL 2 % IJ SOLN
INTRAMUSCULAR | Status: DC | PRN
  Administered 2016-08-19: 10 mL

## 2016-08-19 MED ORDER — LACTATED RINGERS IV SOLN
INTRAVENOUS | Status: DC
  Administered 2016-08-19: 09:00:00 via INTRAVENOUS

## 2016-08-19 MED ORDER — FENTANYL CITRATE (PF) 100 MCG/2ML IJ SOLN
INTRAMUSCULAR | Status: AC
  Filled 2016-08-19: qty 2

## 2016-08-19 MED ORDER — ONDANSETRON HCL 4 MG/2ML IJ SOLN
INTRAMUSCULAR | Status: AC
Start: 2016-08-19 — End: 2016-08-19
  Filled 2016-08-19: qty 2

## 2016-08-19 MED ORDER — SCOPOLAMINE 1 MG/3DAYS TD PT72
MEDICATED_PATCH | TRANSDERMAL | Status: AC
  Administered 2016-08-19: 1.5 mg via TRANSDERMAL
  Filled 2016-08-19: qty 1

## 2016-08-19 MED ORDER — PROPOFOL 10 MG/ML IV BOLUS
INTRAVENOUS | Status: DC | PRN
  Administered 2016-08-19: 200 mg via INTRAVENOUS

## 2016-08-19 MED ORDER — MIDAZOLAM HCL 2 MG/2ML IJ SOLN
INTRAMUSCULAR | Status: AC
  Filled 2016-08-19: qty 2

## 2016-08-19 MED ORDER — FENTANYL CITRATE (PF) 100 MCG/2ML IJ SOLN
INTRAMUSCULAR | Status: DC | PRN
  Administered 2016-08-19: 50 ug via INTRAVENOUS

## 2016-08-19 MED ORDER — KETOROLAC TROMETHAMINE 30 MG/ML IJ SOLN
INTRAMUSCULAR | Status: AC
  Filled 2016-08-19: qty 1

## 2016-08-19 MED ORDER — DEXAMETHASONE SODIUM PHOSPHATE 4 MG/ML IJ SOLN
INTRAMUSCULAR | Status: AC
  Filled 2016-08-19: qty 1

## 2016-08-19 MED ORDER — ONDANSETRON HCL 4 MG/2ML IJ SOLN
INTRAMUSCULAR | Status: DC | PRN
  Administered 2016-08-19: 4 mg via INTRAVENOUS

## 2016-08-19 MED ORDER — PROPOFOL 10 MG/ML IV BOLUS
INTRAVENOUS | Status: AC
  Filled 2016-08-19: qty 20

## 2016-08-19 MED ORDER — LIDOCAINE HCL 2 % IJ SOLN
INTRAMUSCULAR | Status: AC
  Filled 2016-08-19: qty 20

## 2016-08-19 MED ORDER — MIDAZOLAM HCL 2 MG/2ML IJ SOLN
INTRAMUSCULAR | Status: DC | PRN
  Administered 2016-08-19: 2 mg via INTRAVENOUS

## 2016-08-19 MED ORDER — SCOPOLAMINE 1 MG/3DAYS TD PT72
1.0000 | MEDICATED_PATCH | Freq: Once | TRANSDERMAL | Status: DC
  Administered 2016-08-19: 1.5 mg via TRANSDERMAL

## 2016-08-19 MED ORDER — HYDROMORPHONE HCL 1 MG/ML IJ SOLN
0.2500 mg | INTRAMUSCULAR | Status: DC | PRN
  Administered 2016-08-19: 0.5 mg via INTRAVENOUS
  Administered 2016-08-19 (×2): 0.25 mg via INTRAVENOUS

## 2016-08-19 MED ORDER — HYDROMORPHONE HCL 1 MG/ML IJ SOLN
INTRAMUSCULAR | Status: AC
  Administered 2016-08-19: 0.25 mg via INTRAVENOUS
  Filled 2016-08-19: qty 1

## 2016-08-19 MED ORDER — DEXAMETHASONE SODIUM PHOSPHATE 10 MG/ML IJ SOLN
INTRAMUSCULAR | Status: DC | PRN
  Administered 2016-08-19: 4 mg via INTRAVENOUS

## 2016-08-19 MED ORDER — PROMETHAZINE HCL 25 MG/ML IJ SOLN: 6.2500 mg | INTRAMUSCULAR | Status: DC | PRN

## 2016-08-19 MED ORDER — LIDOCAINE HCL (CARDIAC) 20 MG/ML IV SOLN
INTRAVENOUS | Status: DC | PRN
Start: 2016-08-19 — End: 2016-08-19
  Administered 2016-08-19: 100 mg via INTRAVENOUS

## 2016-08-19 MED ORDER — OXYCODONE-ACETAMINOPHEN 5-325 MG PO TABS: 1.0000 | ORAL_TABLET | ORAL | 0 refills | Status: DC | PRN

## 2016-08-19 SURGICAL SUPPLY — 16 items
ABLATOR ENDOMETRIAL BIPOLAR (ABLATOR) ×2 IMPLANT
CANISTER SUCT 3000ML (MISCELLANEOUS) IMPLANT
CATH ROBINSON RED A/P 16FR (CATHETERS) ×2 IMPLANT
CLOTH BEACON ORANGE TIMEOUT ST (SAFETY) ×2 IMPLANT
CONTAINER PREFILL 10% NBF 60ML (FORM) ×2 IMPLANT
GLOVE BIOGEL PI IND STRL 7.0 (GLOVE) ×1 IMPLANT
GLOVE BIOGEL PI INDICATOR 7.0 (GLOVE) ×1
GLOVE ECLIPSE 6.0 STRL STRAW (GLOVE) ×4 IMPLANT
GOWN STRL REUS W/TWL LRG LVL3 (GOWN DISPOSABLE) ×4 IMPLANT
PACK VAGINAL MINOR WOMEN LF (CUSTOM PROCEDURE TRAY) ×2 IMPLANT
PAD OB MATERNITY 4.3X12.25 (PERSONAL CARE ITEMS) ×2 IMPLANT
PAD PREP 24X48 CUFFED NSTRL (MISCELLANEOUS) ×2 IMPLANT
TOWEL OR 17X24 6PK STRL BLUE (TOWEL DISPOSABLE) ×4 IMPLANT
TUBING AQUILEX INFLOW (TUBING) IMPLANT
TUBING AQUILEX OUTFLOW (TUBING) IMPLANT
WATER STERILE IRR 1000ML POUR (IV SOLUTION) ×2 IMPLANT

## 2016-08-19 NOTE — Anesthesia Procedure Notes (Signed)
Procedure Name: LMA Insertion Date/Time: 08/19/2016 9:34 AM Performed by: Hewitt Blade Pre-anesthesia Checklist: Patient identified, Emergency Drugs available, Suction available and Patient being monitored Patient Re-evaluated:Patient Re-evaluated prior to inductionOxygen Delivery Method: Circle system utilized Preoxygenation: Pre-oxygenation with 100% oxygen Intubation Type: IV induction LMA: LMA inserted LMA Size: 4.0 Number of attempts: 1 Placement Confirmation: positive ETCO2 and breath sounds checked- equal and bilateral Tube secured with: Tape Dental Injury: Teeth and Oropharynx as per pre-operative assessment

## 2016-08-19 NOTE — Discharge Instructions (Signed)

## 2016-08-19 NOTE — Op Note (Signed)
Patient Name: [name] MRN: [id number]  Date of Surgery: 08/19/2016    PREOPERATIVE DIAGNOSIS: MENORRHAGIA  POSTOPERATIVE DIAGNOSIS: MENORRHAGIA   PROCEDURE: D&C, Novasure  SURGEON: Clydean Posas D. Deatra Ina M.D.  ANESTHESIA: General  ESTIMATED BLOOD LOSS: Minimal  FINDINGS: Moderate amount of endometrial curettings.   Pre-op SIS showed no abnormalities in the endometrial cavity.  INDICATIONS: Menorrhagia, dysmenorrhea  PROCEDURE IN DETAIL: The patient was taken to the OR and placed in the dors-lithotomy position. The perineum and vagina were prepped and draped in a sterile fashion. Bimanual exam revealed mid to retroverted normal sized uterus. 10 ml of 2% lidocaine was infiltrated in the paracervical tissue.  The uterus sounded to 10 cm and the cervix sounded to 3.5 cm.  Pratt dilators were used to open the cervix to 25 [place]. A sharp curette was introduced into the endometrial cavity and a moderate amount of endometrial tissue was obtained for pathological evaluation.  The Novasure device was placed.  The cavity length was set at 6.5 cm and the width was noted to be 4.5 cm.  An ablation was carried out and lasted 70 seconds. The procedure was then terminated and the patient left the operating room in good condition.

## 2016-08-19 NOTE — Anesthesia Preprocedure Evaluation (Addendum)
Anesthesia Evaluation  Patient identified by MRN, date of birth, ID band Patient awake    Reviewed: Allergy & Precautions, H&P , NPO status , Patient's Chart, lab work & pertinent test results  Airway Mallampati: II  TM Distance: >3 FB Neck ROM: full    Dental no notable dental hx. (+) Teeth Intact, Dental Advisory Given   Pulmonary neg pulmonary ROS, Current Smoker, former smoker,    Pulmonary exam normal        Cardiovascular Exercise Tolerance: Good negative cardio ROS Normal cardiovascular exam     Neuro/Psych PSYCHIATRIC DISORDERS Anxiety Depression Bipolar Disorder negative neurological ROS  negative psych ROS   GI/Hepatic negative GI ROS, (+) Hepatitis -, C  Endo/Other  Hypothyroidism Morbid obesity  Renal/GU negative Renal ROS  negative genitourinary   Musculoskeletal   Abdominal   Peds  Hematology negative hematology ROS (+)   Anesthesia Other Findings   Reproductive/Obstetrics negative OB ROS                            Anesthesia Physical  Anesthesia Plan  ASA: III  Anesthesia Plan: General   Post-op Pain Management:    Induction:   Airway Management Planned: Oral ETT and LMA  Additional Equipment:   Intra-op Plan:   Post-operative Plan: Extubation in OR  Informed Consent: I have reviewed the patients History and Physical, chart, labs and discussed the procedure including the risks, benefits and alternatives for the proposed anesthesia with the patient or authorized representative who has indicated his/her understanding and acceptance.   Dental advisory given  Plan Discussed with: CRNA and Surgeon  Anesthesia Plan Comments:        Anesthesia Quick Evaluation

## 2016-08-19 NOTE — Progress Notes (Signed)
I have interviewed and performed the pertinent exams on my patient to confirm that there have been no significant changes in her condition since the dictation of her history and physical exam.  

## 2016-08-19 NOTE — Anesthesia Postprocedure Evaluation (Signed)
Anesthesia Post Note  Patient: [name]  Procedure(s) Performed: Procedure(s) (LRB): DILATATION & CURETTAGE WITH NOVASURE ABLATION (N/A)  Patient location during evaluation: PACU Anesthesia Type: General Level of consciousness: sedated Pain management: pain level controlled Vital Signs Assessment: post-procedure vital signs reviewed and stable Respiratory status: spontaneous breathing and respiratory function stable Cardiovascular status: stable Anesthetic complications: no     Last Vitals:  Vitals:   08/19/16 1030 08/19/16 1045  BP: 119/64 113/65  Pulse: 81 81  Resp: 11 12  Temp:      Last Pain:  Vitals:   08/19/16 1045  TempSrc:   PainSc: 3    Pain Goal: Patients Stated Pain Goal: 3 (08/19/16 1045)               [place]

## 2016-08-19 NOTE — Transfer of Care (Signed)
Immediate Anesthesia Transfer of Care Note  Patient: [name]  Procedure(s) Performed: Procedure(s): DILATATION & CURETTAGE WITH NOVASURE ABLATION (N/A)  Patient Location: PACU  Anesthesia Type:General  Level of Consciousness: awake, alert  and oriented  Airway & Oxygen Therapy: Patient Spontanous Breathing and Patient connected to nasal cannula oxygen  Post-op Assessment: Report given to RN, Post -op Vital signs reviewed and stable and Patient moving all extremities  Post vital signs: Reviewed and stable  Last Vitals:  Vitals:   08/19/16 0841  BP: 115/69  Pulse: 79  Resp: 20  Temp: 36.9 C    Last Pain:  Vitals:   08/19/16 0841  TempSrc: Oral      Patients Stated Pain Goal: 3 ([id number])  Complications: No apparent anesthesia complications

## 2016-08-21 ENCOUNTER — Encounter (HOSPITAL_COMMUNITY): Payer: Self-pay | Admitting: Obstetrics & Gynecology

## 2016-09-23 DIAGNOSIS — F411 Generalized anxiety disorder: Secondary | ICD-10-CM | POA: Diagnosis not present

## 2016-09-23 DIAGNOSIS — F3132 Bipolar disorder, current episode depressed, moderate: Secondary | ICD-10-CM | POA: Diagnosis not present

## 2016-09-23 DIAGNOSIS — F429 Obsessive-compulsive disorder, unspecified: Secondary | ICD-10-CM | POA: Diagnosis not present

## 2016-09-28 DIAGNOSIS — B1920 Unspecified viral hepatitis C without hepatic coma: Secondary | ICD-10-CM | POA: Diagnosis not present

## 2016-10-21 DIAGNOSIS — E785 Hyperlipidemia, unspecified: Secondary | ICD-10-CM | POA: Diagnosis not present

## 2016-10-21 DIAGNOSIS — R21 Rash and other nonspecific skin eruption: Secondary | ICD-10-CM | POA: Diagnosis not present

## 2016-10-21 DIAGNOSIS — R635 Abnormal weight gain: Secondary | ICD-10-CM | POA: Diagnosis not present

## 2016-10-21 DIAGNOSIS — T50905A Adverse effect of unspecified drugs, medicaments and biological substances, initial encounter: Secondary | ICD-10-CM | POA: Diagnosis not present

## 2016-10-21 DIAGNOSIS — Z23 Encounter for immunization: Secondary | ICD-10-CM | POA: Diagnosis not present

## 2016-11-18 DIAGNOSIS — F3132 Bipolar disorder, current episode depressed, moderate: Secondary | ICD-10-CM | POA: Diagnosis not present

## 2016-11-18 DIAGNOSIS — F411 Generalized anxiety disorder: Secondary | ICD-10-CM | POA: Diagnosis not present

## 2016-11-18 DIAGNOSIS — F429 Obsessive-compulsive disorder, unspecified: Secondary | ICD-10-CM | POA: Diagnosis not present

## 2016-12-22 DIAGNOSIS — E039 Hypothyroidism, unspecified: Secondary | ICD-10-CM | POA: Diagnosis not present

## 2016-12-22 DIAGNOSIS — N329 Bladder disorder, unspecified: Secondary | ICD-10-CM | POA: Diagnosis not present

## 2016-12-22 DIAGNOSIS — G8929 Other chronic pain: Secondary | ICD-10-CM | POA: Diagnosis not present

## 2016-12-22 DIAGNOSIS — M25562 Pain in left knee: Secondary | ICD-10-CM | POA: Diagnosis not present

## 2016-12-22 DIAGNOSIS — Z23 Encounter for immunization: Secondary | ICD-10-CM | POA: Diagnosis not present

## 2016-12-22 DIAGNOSIS — M25552 Pain in left hip: Secondary | ICD-10-CM | POA: Diagnosis not present

## 2016-12-22 DIAGNOSIS — M25551 Pain in right hip: Secondary | ICD-10-CM | POA: Diagnosis not present

## 2016-12-22 DIAGNOSIS — F419 Anxiety disorder, unspecified: Secondary | ICD-10-CM | POA: Diagnosis not present

## 2016-12-22 DIAGNOSIS — M25561 Pain in right knee: Secondary | ICD-10-CM | POA: Diagnosis not present

## 2016-12-28 DIAGNOSIS — G8929 Other chronic pain: Secondary | ICD-10-CM | POA: Diagnosis not present

## 2016-12-28 DIAGNOSIS — F419 Anxiety disorder, unspecified: Secondary | ICD-10-CM | POA: Diagnosis not present

## 2016-12-28 DIAGNOSIS — M25551 Pain in right hip: Secondary | ICD-10-CM | POA: Diagnosis not present

## 2016-12-28 DIAGNOSIS — Z6841 Body Mass Index (BMI) 40.0 and over, adult: Secondary | ICD-10-CM | POA: Diagnosis not present

## 2016-12-28 DIAGNOSIS — M25561 Pain in right knee: Secondary | ICD-10-CM | POA: Diagnosis not present

## 2016-12-28 DIAGNOSIS — M25562 Pain in left knee: Secondary | ICD-10-CM | POA: Diagnosis not present

## 2016-12-28 DIAGNOSIS — M25552 Pain in left hip: Secondary | ICD-10-CM | POA: Diagnosis not present

## 2016-12-28 DIAGNOSIS — M7989 Other specified soft tissue disorders: Secondary | ICD-10-CM | POA: Diagnosis not present

## 2017-01-19 DIAGNOSIS — F411 Generalized anxiety disorder: Secondary | ICD-10-CM | POA: Diagnosis not present

## 2017-01-19 DIAGNOSIS — F429 Obsessive-compulsive disorder, unspecified: Secondary | ICD-10-CM | POA: Diagnosis not present

## 2017-01-19 DIAGNOSIS — F3132 Bipolar disorder, current episode depressed, moderate: Secondary | ICD-10-CM | POA: Diagnosis not present

## 2017-03-22 DIAGNOSIS — F411 Generalized anxiety disorder: Secondary | ICD-10-CM | POA: Diagnosis not present

## 2017-03-22 DIAGNOSIS — F429 Obsessive-compulsive disorder, unspecified: Secondary | ICD-10-CM | POA: Diagnosis not present

## 2017-03-22 DIAGNOSIS — F3132 Bipolar disorder, current episode depressed, moderate: Secondary | ICD-10-CM | POA: Diagnosis not present

## 2017-05-05 DIAGNOSIS — R739 Hyperglycemia, unspecified: Secondary | ICD-10-CM | POA: Diagnosis not present

## 2017-05-05 DIAGNOSIS — G4719 Other hypersomnia: Secondary | ICD-10-CM | POA: Diagnosis not present

## 2017-05-05 DIAGNOSIS — R635 Abnormal weight gain: Secondary | ICD-10-CM | POA: Diagnosis not present

## 2017-05-05 DIAGNOSIS — E559 Vitamin D deficiency, unspecified: Secondary | ICD-10-CM | POA: Diagnosis not present

## 2017-05-05 DIAGNOSIS — R0683 Snoring: Secondary | ICD-10-CM | POA: Diagnosis not present

## 2017-05-24 DIAGNOSIS — F3132 Bipolar disorder, current episode depressed, moderate: Secondary | ICD-10-CM | POA: Diagnosis not present

## 2017-05-24 DIAGNOSIS — F411 Generalized anxiety disorder: Secondary | ICD-10-CM | POA: Diagnosis not present

## 2017-05-24 DIAGNOSIS — F429 Obsessive-compulsive disorder, unspecified: Secondary | ICD-10-CM | POA: Diagnosis not present

## 2017-05-26 DIAGNOSIS — L720 Epidermal cyst: Secondary | ICD-10-CM | POA: Diagnosis not present

## 2017-06-01 DIAGNOSIS — L720 Epidermal cyst: Secondary | ICD-10-CM | POA: Diagnosis not present

## 2017-07-07 DIAGNOSIS — B1920 Unspecified viral hepatitis C without hepatic coma: Secondary | ICD-10-CM | POA: Diagnosis not present

## 2017-07-26 DIAGNOSIS — F3132 Bipolar disorder, current episode depressed, moderate: Secondary | ICD-10-CM | POA: Diagnosis not present

## 2017-07-26 DIAGNOSIS — F411 Generalized anxiety disorder: Secondary | ICD-10-CM | POA: Diagnosis not present

## 2017-07-26 DIAGNOSIS — F429 Obsessive-compulsive disorder, unspecified: Secondary | ICD-10-CM | POA: Diagnosis not present

## 2017-09-19 DIAGNOSIS — L710 Perioral dermatitis: Secondary | ICD-10-CM | POA: Diagnosis not present

## 2017-09-19 DIAGNOSIS — L718 Other rosacea: Secondary | ICD-10-CM | POA: Diagnosis not present

## 2017-09-21 DIAGNOSIS — F3132 Bipolar disorder, current episode depressed, moderate: Secondary | ICD-10-CM | POA: Diagnosis not present

## 2017-09-21 DIAGNOSIS — F411 Generalized anxiety disorder: Secondary | ICD-10-CM | POA: Diagnosis not present

## 2017-09-21 DIAGNOSIS — F429 Obsessive-compulsive disorder, unspecified: Secondary | ICD-10-CM | POA: Diagnosis not present

## 2017-10-27 DIAGNOSIS — R635 Abnormal weight gain: Secondary | ICD-10-CM | POA: Diagnosis not present

## 2017-10-27 DIAGNOSIS — R739 Hyperglycemia, unspecified: Secondary | ICD-10-CM | POA: Diagnosis not present

## 2017-11-01 DIAGNOSIS — E782 Mixed hyperlipidemia: Secondary | ICD-10-CM | POA: Diagnosis not present

## 2017-11-01 DIAGNOSIS — Z6841 Body Mass Index (BMI) 40.0 and over, adult: Secondary | ICD-10-CM | POA: Diagnosis not present

## 2017-11-01 DIAGNOSIS — E039 Hypothyroidism, unspecified: Secondary | ICD-10-CM | POA: Diagnosis not present

## 2017-11-01 DIAGNOSIS — R739 Hyperglycemia, unspecified: Secondary | ICD-10-CM | POA: Diagnosis not present

## 2017-12-14 DIAGNOSIS — F429 Obsessive-compulsive disorder, unspecified: Secondary | ICD-10-CM | POA: Diagnosis not present

## 2017-12-14 DIAGNOSIS — F3132 Bipolar disorder, current episode depressed, moderate: Secondary | ICD-10-CM | POA: Diagnosis not present

## 2017-12-14 DIAGNOSIS — F411 Generalized anxiety disorder: Secondary | ICD-10-CM | POA: Diagnosis not present

## 2017-12-15 ENCOUNTER — Other Ambulatory Visit: Payer: Self-pay

## 2017-12-15 ENCOUNTER — Encounter (HOSPITAL_BASED_OUTPATIENT_CLINIC_OR_DEPARTMENT_OTHER): Payer: Self-pay | Admitting: *Deleted

## 2017-12-15 ENCOUNTER — Emergency Department (HOSPITAL_BASED_OUTPATIENT_CLINIC_OR_DEPARTMENT_OTHER)
Admission: EM | Admit: 2017-12-15 | Discharge: 2017-12-15 | Disposition: A | Discharge status: Home / Self Care | Payer: PPO | Attending: Emergency Medicine | Admitting: Emergency Medicine

## 2017-12-15 DIAGNOSIS — R0602 Shortness of breath: Secondary | ICD-10-CM | POA: Diagnosis not present

## 2017-12-15 DIAGNOSIS — R600 Localized edema: Secondary | ICD-10-CM

## 2017-12-15 DIAGNOSIS — E039 Hypothyroidism, unspecified: Secondary | ICD-10-CM | POA: Diagnosis not present

## 2017-12-15 DIAGNOSIS — Z79899 Other long term (current) drug therapy: Secondary | ICD-10-CM | POA: Diagnosis not present

## 2017-12-15 DIAGNOSIS — Z87891 Personal history of nicotine dependence: Secondary | ICD-10-CM | POA: Diagnosis not present

## 2017-12-15 DIAGNOSIS — R0789 Other chest pain: Secondary | ICD-10-CM | POA: Diagnosis not present

## 2017-12-15 DIAGNOSIS — F419 Anxiety disorder, unspecified: Secondary | ICD-10-CM | POA: Diagnosis not present

## 2017-12-15 DIAGNOSIS — R030 Elevated blood-pressure reading, without diagnosis of hypertension: Secondary | ICD-10-CM | POA: Diagnosis present

## 2017-12-15 LAB — BASIC METABOLIC PANEL
Anion gap: 5 (ref 5–15)
BUN: 9 mg/dL (ref 6–20)
CO2: 26 mmol/L (ref 22–32)
Calcium: 9.1 mg/dL (ref 8.9–10.3)
Chloride: 104 mmol/L (ref 101–111)
Creatinine, Ser: 0.7 mg/dL (ref 0.44–1.00)
GFR calc Af Amer: >60 mL/min (ref >60)
GFR calc non Af Amer: >60 mL/min (ref >60)
Glucose, Bld: 118 mg/dL — ABNORMAL HIGH (ref 65–99)
Potassium: 3.9 mmol/L (ref 3.5–5.1)
Sodium: 135 mmol/L (ref 135–145)

## 2017-12-15 LAB — BRAIN NATRIURETIC PEPTIDE: B Natriuretic Peptide: 12.8 pg/mL (ref 0.0–100.0)

## 2017-12-15 NOTE — Discharge Instructions (Addendum)
For leg swelling, try to elevate your feet as much as possible. You can also buy over the counter compression stockings. Wear these during the day when you are on your feet.   Please schedule an appointment with your primarily doctor within the next week to discuss your swelling. For now, continue taking your current medications as prescribed.   If your swelling worsens, or if you have worsening shortness of breath or chest pain, please return to the emergency room.

## 2017-12-15 NOTE — ED Triage Notes (Signed)
Pt c/o increased BP and bil leg and hand swelling x 1 month

## 2017-12-15 NOTE — ED Notes (Addendum)
Tightness to bilateral lower extremities.

## 2017-12-15 NOTE — ED Notes (Signed)
Patient stated that she has difficulty going around d/t weight gain.  She stated that she had gained 70-75 lbs in 6 months.

## 2017-12-15 NOTE — ED Provider Notes (Signed)
[facility] EMERGENCY DEPARTMENT Provider Note   CSN: [id number] Arrival date & time: 12/15/17  1358  History   Chief Complaint Chief Complaint  Patient presents with  . Hypertension    HPI [name] is a 47 y.o. female presenting with HTN and extremity swelling.  HPI   Patient presents with elevated BP. She was at her psychiatrist's office yesterday, and her BP was noted to be 211/87. She does not have higher of HTN or elevated BP measurements. Psychiatrist felt it was fine to f/u with her PCP at her earliest convenience, however she talked to her mother who is a nurse who encouraged her to be seen today. She does not take any anti-hypertensives. Psychiatrist discontinued Reglan and Abilify yesterday after elevated BP. Synthroid dose was increased 2-3 months ago. No medication changes other than the aforementioned. Denies recurrent HA, vision changes. Does endorse dyspnea on exertion which she attributes to her recent weight gain.  Patient also endorses worsening edema of LE bilaterally and hands bilaterally for the past month. Has tried elevating her feet which does not help much. Has not tried compression stockings. Hands are so swollen that she can no longer wear some of her rings. Swelling has been accompanied by pain for the past week. Denies erythema of legs or point tenderness. Does endorse dyspnea on exertion as above. Endorses midsternal chest pressure and intermittent sharp L-sided chest pain which she attributes to anxiety. Denies palpitations. Has gained ~70 pounds in past 5 months. Thinks she has been eating more sweets than normal but no other eating or activity changes or major life changes. No personal history of clots. No recent extended plane or car trips.    Past Medical History:  Diagnosis Date  . ADHD (attention deficit hyperactivity disorder)   . Anemia    with pregnancy  . Anxiety   . Arthritis   . Bipolar disorder ([place])   . Chronic back pain  greater than 3 months duration   . Depression   . Dysuria    has to have self in and out cath   . Eroded bladder suspension mesh ([place])   . Hepatitis    C  . Hypothyroidism   . Leg pain, bilateral   . Rosacea   . Sciatic nerve pain   . Social anxiety disorder   . Thyroid disease     Patient Active Problem List   Diagnosis Date Noted  . Recurrent major depression-severe ([place]) 04/17/2015  . GAD (generalized anxiety disorder) 04/15/2015  . Bipolar depression ([place]) 04/15/2015  . Severe recurrent major depression w/psychotic features, mood-congruent ([place]) 04/14/2015  . Anxiety disorder 04/14/2015  . Major depressive disorder, recurrent severe without psychotic features ([place]) 04/14/2015  . Suicidal ideations   . Chronic LBP 01/02/2015  . Adult hypothyroidism 06/25/2012    Past Surgical History:  Procedure Laterality Date  . BACK SURGERY     total of 6 back surgeries  . bladder mesh  2009  . DILITATION & CURRETTAGE/HYSTROSCOPY WITH NOVASURE ABLATION N/A 08/19/2016   Procedure: DILATATION & CURETTAGE WITH NOVASURE ABLATION;  Surgeon: [name], MD;  Location: [facility];  Service: Gynecology;  Laterality: N/A;  . EUS N/A 09/09/2015   Procedure: UPPER ENDOSCOPIC ULTRASOUND (EUS) RADIAL;  Surgeon: [name], MD;  Location: [facility];  Service: Endoscopy;  Laterality: N/A;  . EYE SURGERY    . INCONTINENCE SURGERY    . SPINAL FUSION  2008   times two     OB History  None      Home Medications    Prior to Admission medications   Medication Sig Start Date End Date Taking? Authorizing Provider  ALPRAZolam Duanne Moron) 1 MG tablet Take 1 tablet (1 mg total) by mouth 3 (three) times daily as needed. anxiety 09/17/15   Clarene Reamer, MD  ARIPiprazole (ABILIFY) 5 MG tablet Take 1 tablet (5 mg total) by mouth daily. 09/17/15   Clarene Reamer, MD  Cholecalciferol (VITAMIN D) 2000 units tablet Take 2,000 Units by mouth daily.    [provider]  clomiPRAMINE  (ANAFRANIL) 75 MG capsule Take 75 mg by mouth at bedtime.    [provider]  DULoxetine (CYMBALTA) 60 MG capsule Take 2 capsules (120 mg total) by mouth daily. 04/17/15   Niel Hummer, NP  gabapentin (NEURONTIN) 300 MG capsule Take 3 capsules (900 mg total) by mouth at bedtime. 09/17/15   Clarene Reamer, MD  ibuprofen (ADVIL,MOTRIN) 200 MG tablet Take 200 mg by mouth every 6 (six) hours as needed for cramping.    [provider]  lamoTRIgine (LAMICTAL) 100 MG tablet Take 1 tablet (100 mg total) by mouth daily. 09/17/15   Clarene Reamer, MD  levothyroxine (SYNTHROID, LEVOTHROID) 50 MCG tablet Take 1 tablet (50 mcg total) by mouth daily. 04/17/15   Niel Hummer, NP  methylphenidate (RITALIN) 20 MG tablet Take 1 tablet (20 mg total) by mouth 2 (two) times daily with breakfast and lunch. Do not fill before 14 November 2015 09/17/15 09/16/16  Clarene Reamer, MD  Multiple Vitamins-Minerals (MULTIVITAMIN WITH MINERALS) tablet Take 1 tablet by mouth daily.    [provider]  trazodone (DESYREL) 300 MG tablet Take 300 mg by mouth at bedtime.    [provider]    Family History Family History  Problem Relation Age of Onset  . Schizophrenia Father   . Schizophrenia Cousin     Social History Social History   Tobacco Use  . Smoking status: Former Smoker    Packs/day: 1.00    Types: Cigarettes    Last attempt to quit: 02/09/2016    Years since quitting: 1.8  . Smokeless tobacco: Never Used  . Tobacco comment: use vapor  Substance Use Topics  . Alcohol use: No  . Drug use: No     Allergies   Pregabalin; Celebrex [celecoxib]; Effexor [venlafaxine hcl]; Mirapex [pramipexole]; Pristiq [desvenlafaxine]; Prozac [fluoxetine hcl]; Symbyax [olanzapine-fluoxetine hcl]; and Wellbutrin [bupropion hcl]   Review of Systems Review of Systems  Constitutional: Positive for appetite change and unexpected weight change. Negative for fever.  Respiratory: Positive for  chest tightness and shortness of breath (With exertion).   Cardiovascular: Positive for leg swelling. Negative for palpitations.  Skin: Negative for color change.  Psychiatric/Behavioral: The patient is nervous/anxious.    Physical Exam Updated Vital Signs BP (!) 96/56   Pulse 81   Temp 98.4 F (36.9 C) (Oral)   Resp 16   Ht 5\' 5"  (1.651 m)   Wt 123.8 kg (273 lb)   LMP 11/16/2017   SpO2 100%   BMI 45.43 kg/m   Physical Exam  Constitutional: She is oriented to person, place, and time.  Obese female sitting up in bed in NAD  HENT:  Head: Normocephalic and atraumatic.  Nose: Nose normal.  Mouth/Throat: Oropharynx is clear and moist. No oropharyngeal exudate.  Eyes: Pupils are equal, round, and reactive to light. Conjunctivae and EOM are normal. Right eye exhibits no discharge. Left eye exhibits no  discharge.  Neck: Normal range of motion. Neck supple.  Cardiovascular: Normal rate, regular rhythm and normal heart sounds.  No murmur heard. No TTP of chest wall  Pulmonary/Chest:  Normal WOB on RA. Able to speak in full sentences without difficulty. Lungs CTAB bilaterally.   Abdominal: Soft. Bowel sounds are normal. She exhibits no distension. There is no tenderness.  Musculoskeletal:  2+ pitting edema to knee bilaterally. Generalized TTP of both lower extremities. No specific point tenderness. No palpable cords. 5/5 strength upper and lower extremities bilaterally. No obvious swelling of hands, however indentations left when patient removes rings.   Lymphadenopathy:    She has no cervical adenopathy.  Neurological: She is alert and oriented to person, place, and time.  Skin:  No erythema or venous stasis changes of LE skin. Skin warm and dry.   Psychiatric: She has a normal mood and affect. Her behavior is normal.  Nursing note and vitals reviewed.   ED Treatments / Results  Labs (all labs ordered are listed, but only abnormal results are displayed) Labs Reviewed  BASIC  METABOLIC PANEL - Abnormal; Notable for the following components:      Result Value   Glucose, Bld 118 (*)    All other components within normal limits  BRAIN NATRIURETIC PEPTIDE    EKG EKG Interpretation  Date/Time:  Friday December 15 2017 16:36:54 EDT Ventricular Rate:  80 PR Interval:    QRS Duration: 82 QT Interval:  384 QTC Calculation: 443 R Axis:   74 Text Interpretation:  Sinus rhythm Low voltage, precordial leads No significant change since last tracing Confirmed by Blanchie Dessert [phone]) on 12/15/2017 5:17:01 PM   Radiology No results found.  Procedures Procedures (including critical care time)  Medications Ordered in ED Medications - No data to display   Initial Impression / Assessment and Plan / ED Course  I have reviewed the triage vital signs and the nursing notes.  Pertinent labs & imaging results that were available during my care of the patient were reviewed by me and considered in my medical decision making (see chart for details).     1600 Patient presenting with elevated BP yesterday as well as LE and hand edema. BP 123/74 upon presentation today. Denies HA or vision changes. Not currently meeting criteria for hypertensive urgency or emergency. Will obtain BMP to check Cr. Regarding edema, significant 2+ pitting edema to LE bilaterally. Less concern for DVT given bilateral nature as well as significant pitting. LE generally TTP, but no specific point tenderness or palpable cords. Wells score -1 (low risk). SOB is mildly concerning, however could also be related to excessive weight gain recently and deconditioning, as SOB only occurs with activity. Chest pain could be related to anxiety as patient suspects, but cannot assume this. Will get CXR and BNP as well.  1718  BMP and BNP unremarkable. EKG unremarkable as well. Repeat BP actually lower than prior, at 96/56. LE edema likely gravity-dependent edema given bilateral nature and pitting. Recommend f/u with  PCP. Will encourage elevation and compression stockings in meantime. Stable for discharge home.   Final Clinical Impressions(s) / ED Diagnoses   Final diagnoses:  Bilateral lower extremity edema    ED Discharge Orders    None     Adin Hector, MD, MPH PGY-3 [facility] Pager [phone]    Verner Mould, MD 12/15/17 1738    Blanchie Dessert, MD 12/16/17 [phone]

## 2017-12-15 NOTE — ED Notes (Signed)
Patient stated that she has not been taking her Ritalin as per her Psychiatrist recommendation.  The last time she took it was yesterday.

## 2017-12-15 NOTE — ED Notes (Signed)
ED Provider at bedside. 

## 2018-01-03 DIAGNOSIS — H04322 Acute dacryocystitis of left lacrimal passage: Secondary | ICD-10-CM | POA: Diagnosis not present

## 2018-01-05 DIAGNOSIS — F32A Depression, unspecified: Secondary | ICD-10-CM | POA: Insufficient documentation

## 2018-01-05 DIAGNOSIS — H0014 Chalazion left upper eyelid: Secondary | ICD-10-CM | POA: Diagnosis not present

## 2018-01-05 DIAGNOSIS — N329 Bladder disorder, unspecified: Secondary | ICD-10-CM | POA: Insufficient documentation

## 2018-01-05 DIAGNOSIS — G4700 Insomnia, unspecified: Secondary | ICD-10-CM | POA: Insufficient documentation

## 2018-01-22 DIAGNOSIS — B1920 Unspecified viral hepatitis C without hepatic coma: Secondary | ICD-10-CM | POA: Diagnosis not present

## 2018-01-23 DIAGNOSIS — Z09 Encounter for follow-up examination after completed treatment for conditions other than malignant neoplasm: Secondary | ICD-10-CM | POA: Insufficient documentation

## 2018-02-09 ENCOUNTER — Other Ambulatory Visit: Payer: Self-pay | Admitting: Gastroenterology

## 2018-02-09 DIAGNOSIS — B1920 Unspecified viral hepatitis C without hepatic coma: Secondary | ICD-10-CM

## 2018-02-12 DIAGNOSIS — R519 Headache, unspecified: Secondary | ICD-10-CM | POA: Insufficient documentation

## 2018-02-12 DIAGNOSIS — Z6841 Body Mass Index (BMI) 40.0 and over, adult: Secondary | ICD-10-CM | POA: Diagnosis not present

## 2018-02-12 DIAGNOSIS — R600 Localized edema: Secondary | ICD-10-CM | POA: Insufficient documentation

## 2018-02-12 DIAGNOSIS — R300 Dysuria: Secondary | ICD-10-CM | POA: Insufficient documentation

## 2018-02-12 DIAGNOSIS — G8929 Other chronic pain: Secondary | ICD-10-CM | POA: Insufficient documentation

## 2018-02-12 DIAGNOSIS — E039 Hypothyroidism, unspecified: Secondary | ICD-10-CM | POA: Diagnosis not present

## 2018-02-12 DIAGNOSIS — R51 Headache: Secondary | ICD-10-CM | POA: Diagnosis not present

## 2018-02-12 DIAGNOSIS — E782 Mixed hyperlipidemia: Secondary | ICD-10-CM | POA: Diagnosis not present

## 2018-03-21 DIAGNOSIS — F3132 Bipolar disorder, current episode depressed, moderate: Secondary | ICD-10-CM | POA: Diagnosis not present

## 2018-03-21 DIAGNOSIS — F429 Obsessive-compulsive disorder, unspecified: Secondary | ICD-10-CM | POA: Diagnosis not present

## 2018-03-21 DIAGNOSIS — F411 Generalized anxiety disorder: Secondary | ICD-10-CM | POA: Diagnosis not present

## 2018-05-03 DIAGNOSIS — E782 Mixed hyperlipidemia: Secondary | ICD-10-CM | POA: Diagnosis not present

## 2018-05-03 DIAGNOSIS — Z6841 Body Mass Index (BMI) 40.0 and over, adult: Secondary | ICD-10-CM | POA: Diagnosis not present

## 2018-05-03 DIAGNOSIS — E039 Hypothyroidism, unspecified: Secondary | ICD-10-CM | POA: Diagnosis not present

## 2018-05-03 DIAGNOSIS — Z0000 Encounter for general adult medical examination without abnormal findings: Secondary | ICD-10-CM | POA: Diagnosis not present

## 2018-05-03 DIAGNOSIS — R739 Hyperglycemia, unspecified: Secondary | ICD-10-CM | POA: Diagnosis not present

## 2018-05-08 DIAGNOSIS — G4719 Other hypersomnia: Secondary | ICD-10-CM | POA: Diagnosis not present

## 2018-05-08 DIAGNOSIS — Z0000 Encounter for general adult medical examination without abnormal findings: Secondary | ICD-10-CM | POA: Diagnosis not present

## 2018-05-08 DIAGNOSIS — E039 Hypothyroidism, unspecified: Secondary | ICD-10-CM | POA: Diagnosis not present

## 2018-05-08 DIAGNOSIS — R0683 Snoring: Secondary | ICD-10-CM | POA: Diagnosis not present

## 2018-06-20 DIAGNOSIS — F411 Generalized anxiety disorder: Secondary | ICD-10-CM | POA: Diagnosis not present

## 2018-06-20 DIAGNOSIS — F429 Obsessive-compulsive disorder, unspecified: Secondary | ICD-10-CM | POA: Diagnosis not present

## 2018-06-20 DIAGNOSIS — F3132 Bipolar disorder, current episode depressed, moderate: Secondary | ICD-10-CM | POA: Diagnosis not present

## 2018-07-02 DIAGNOSIS — R748 Abnormal levels of other serum enzymes: Secondary | ICD-10-CM | POA: Diagnosis not present

## 2018-07-02 DIAGNOSIS — Z1211 Encounter for screening for malignant neoplasm of colon: Secondary | ICD-10-CM | POA: Diagnosis not present

## 2018-07-02 DIAGNOSIS — E669 Obesity, unspecified: Secondary | ICD-10-CM | POA: Diagnosis not present

## 2018-07-02 DIAGNOSIS — B1920 Unspecified viral hepatitis C without hepatic coma: Secondary | ICD-10-CM | POA: Diagnosis not present

## 2018-07-02 DIAGNOSIS — I10 Essential (primary) hypertension: Secondary | ICD-10-CM | POA: Diagnosis not present

## 2018-07-04 DIAGNOSIS — I10 Essential (primary) hypertension: Secondary | ICD-10-CM | POA: Diagnosis not present

## 2018-07-04 DIAGNOSIS — R079 Chest pain, unspecified: Secondary | ICD-10-CM | POA: Diagnosis not present

## 2018-07-04 DIAGNOSIS — Z6841 Body Mass Index (BMI) 40.0 and over, adult: Secondary | ICD-10-CM | POA: Diagnosis not present

## 2018-07-16 ENCOUNTER — Other Ambulatory Visit: Payer: Self-pay | Admitting: Internal Medicine

## 2018-07-16 ENCOUNTER — Ambulatory Visit
Admission: RE | Admit: 2018-07-16 | Discharge: 2018-07-16 | Disposition: A | Discharge status: Home / Self Care | Payer: PPO | Source: Ambulatory Visit | Attending: Gastroenterology | Admitting: Gastroenterology

## 2018-07-16 DIAGNOSIS — B1920 Unspecified viral hepatitis C without hepatic coma: Secondary | ICD-10-CM

## 2018-07-16 DIAGNOSIS — K760 Fatty (change of) liver, not elsewhere classified: Secondary | ICD-10-CM | POA: Diagnosis not present

## 2018-07-17 DIAGNOSIS — E782 Mixed hyperlipidemia: Secondary | ICD-10-CM | POA: Diagnosis not present

## 2018-07-17 DIAGNOSIS — N644 Mastodynia: Secondary | ICD-10-CM | POA: Diagnosis not present

## 2018-07-17 DIAGNOSIS — Z2821 Immunization not carried out because of patient refusal: Secondary | ICD-10-CM | POA: Insufficient documentation

## 2018-07-17 DIAGNOSIS — E039 Hypothyroidism, unspecified: Secondary | ICD-10-CM | POA: Diagnosis not present

## 2018-07-17 DIAGNOSIS — R7303 Prediabetes: Secondary | ICD-10-CM | POA: Diagnosis not present

## 2018-07-17 DIAGNOSIS — G4700 Insomnia, unspecified: Secondary | ICD-10-CM | POA: Diagnosis not present

## 2018-07-17 DIAGNOSIS — B182 Chronic viral hepatitis C: Secondary | ICD-10-CM | POA: Diagnosis not present

## 2018-07-17 DIAGNOSIS — M5440 Lumbago with sciatica, unspecified side: Secondary | ICD-10-CM | POA: Diagnosis not present

## 2018-07-17 DIAGNOSIS — Z6841 Body Mass Index (BMI) 40.0 and over, adult: Secondary | ICD-10-CM | POA: Diagnosis not present

## 2018-07-17 DIAGNOSIS — D72829 Elevated white blood cell count, unspecified: Secondary | ICD-10-CM | POA: Diagnosis not present

## 2018-07-17 DIAGNOSIS — G8929 Other chronic pain: Secondary | ICD-10-CM | POA: Diagnosis not present

## 2018-07-18 ENCOUNTER — Other Ambulatory Visit: Payer: Self-pay | Admitting: Internal Medicine

## 2018-07-18 DIAGNOSIS — Z1231 Encounter for screening mammogram for malignant neoplasm of breast: Secondary | ICD-10-CM

## 2018-08-08 DIAGNOSIS — R0982 Postnasal drip: Secondary | ICD-10-CM | POA: Diagnosis not present

## 2018-08-08 DIAGNOSIS — J0110 Acute frontal sinusitis, unspecified: Secondary | ICD-10-CM | POA: Diagnosis not present

## 2018-08-13 ENCOUNTER — Emergency Department (HOSPITAL_BASED_OUTPATIENT_CLINIC_OR_DEPARTMENT_OTHER)
Admission: EM | Admit: 2018-08-13 | Discharge: 2018-08-13 | Disposition: A | Discharge status: Home / Self Care | Payer: PPO | Attending: Emergency Medicine | Admitting: Emergency Medicine

## 2018-08-13 ENCOUNTER — Emergency Department (HOSPITAL_BASED_OUTPATIENT_CLINIC_OR_DEPARTMENT_OTHER): Payer: PPO

## 2018-08-13 ENCOUNTER — Encounter (HOSPITAL_BASED_OUTPATIENT_CLINIC_OR_DEPARTMENT_OTHER): Payer: Self-pay

## 2018-08-13 ENCOUNTER — Other Ambulatory Visit: Payer: Self-pay

## 2018-08-13 DIAGNOSIS — R05 Cough: Secondary | ICD-10-CM | POA: Diagnosis not present

## 2018-08-13 DIAGNOSIS — B9789 Other viral agents as the cause of diseases classified elsewhere: Secondary | ICD-10-CM | POA: Diagnosis not present

## 2018-08-13 DIAGNOSIS — Z87891 Personal history of nicotine dependence: Secondary | ICD-10-CM | POA: Diagnosis not present

## 2018-08-13 DIAGNOSIS — R079 Chest pain, unspecified: Secondary | ICD-10-CM | POA: Diagnosis not present

## 2018-08-13 DIAGNOSIS — R062 Wheezing: Secondary | ICD-10-CM | POA: Diagnosis not present

## 2018-08-13 DIAGNOSIS — J069 Acute upper respiratory infection, unspecified: Secondary | ICD-10-CM | POA: Diagnosis not present

## 2018-08-13 LAB — GROUP A STREP BY PCR: Group A Strep by PCR: NOT DETECTED

## 2018-08-13 MED ORDER — ALBUTEROL SULFATE HFA 108 (90 BASE) MCG/ACT IN AERS
2.0000 | INHALATION_SPRAY | RESPIRATORY_TRACT | Status: DC | PRN
  Administered 2018-08-13: 2 via RESPIRATORY_TRACT
  Filled 2018-08-13: qty 6.7

## 2018-08-13 MED ORDER — IPRATROPIUM-ALBUTEROL 0.5-2.5 (3) MG/3ML IN SOLN
3.0000 mL | Freq: Four times a day (QID) | RESPIRATORY_TRACT | Status: DC
  Administered 2018-08-13: 3 mL via RESPIRATORY_TRACT
  Filled 2018-08-13: qty 3

## 2018-08-13 MED ORDER — GUAIFENESIN-CODEINE 100-10 MG/5ML PO SOLN: 10.0000 mL | Freq: Four times a day (QID) | ORAL | 0 refills | Status: DC | PRN

## 2018-08-13 MED ORDER — PREDNISONE 50 MG PO TABS: 50.0000 mg | ORAL_TABLET | Freq: Every day | ORAL | 0 refills | Status: AC

## 2018-08-13 MED ORDER — PREDNISONE 50 MG PO TABS
60.0000 mg | ORAL_TABLET | Freq: Once | ORAL | Status: AC
  Administered 2018-08-13: 60 mg via ORAL
  Filled 2018-08-13: qty 1

## 2018-08-13 NOTE — Discharge Instructions (Signed)
Your history and exam today are consistent with a viral upper respiratory infection.  We suspect this caused the wheezing and shortness of breath you are feeling.  The x-ray did not show notes of pneumonia however we did find the wheezing.  You improved after a breathing treatment and we feel safe giving a prescription for the albuterol to go home with.  Please take steroids for the next 4 days starting tomorrow as you received today's dose already.  Please use the new cough medicine to help with your discomfort.  If any symptoms change or worsen, return to the nearest emergency department and please follow-up with your primary doctor.

## 2018-08-13 NOTE — ED Triage Notes (Signed)
C/o flu like sx x 4 days-seen by PCP last week-dx with "allergies and post nasal drip"-rx cough med, claratin, flonase-NAD-steady gait

## 2018-08-13 NOTE — ED Provider Notes (Signed)
[facility] EMERGENCY DEPARTMENT Provider Note   CSN: [id number] Arrival date & time: 08/13/18  1123     History   Chief Complaint Chief Complaint  Patient presents with  . Cough    HPI [name] is a 47 y.o. female.  The history is provided by the patient and medical records. No language interpreter was used.  URI   This is a new problem. The current episode started more than 2 days ago. The problem has not changed since onset.There has been no fever. Associated symptoms include congestion, rhinorrhea, sore throat, cough and wheezing. Pertinent negatives include no chest pain, no abdominal pain, no diarrhea, no nausea, no vomiting, no dysuria, no headaches and no neck pain. She has tried nothing for the symptoms. The treatment provided no relief.    Past Medical History:  Diagnosis Date  . ADHD (attention deficit hyperactivity disorder)   . Anemia    with pregnancy  . Anxiety   . Arthritis   . Bipolar disorder ([place])   . Chronic back pain greater than 3 months duration   . Depression   . Dysuria    has to have self in and out cath   . Eroded bladder suspension mesh ([place])   . Hepatitis    C  . Hypothyroidism   . Leg pain, bilateral   . Rosacea   . Sciatic nerve pain   . Social anxiety disorder   . Thyroid disease     Patient Active Problem List   Diagnosis Date Noted  . Recurrent major depression-severe ([place]) 04/17/2015  . GAD (generalized anxiety disorder) 04/15/2015  . Bipolar depression ([place]) 04/15/2015  . Severe recurrent major depression w/psychotic features, mood-congruent ([place]) 04/14/2015  . Anxiety disorder 04/14/2015  . Major depressive disorder, recurrent severe without psychotic features ([place]) 04/14/2015  . Suicidal ideations   . Chronic LBP 01/02/2015  . Adult hypothyroidism 06/25/2012    Past Surgical History:  Procedure Laterality Date  . BACK SURGERY     total of 6 back surgeries  . bladder mesh  2009  . DILITATION &  CURRETTAGE/HYSTROSCOPY WITH NOVASURE ABLATION N/A 08/19/2016   Procedure: DILATATION & CURETTAGE WITH NOVASURE ABLATION;  Surgeon: [name], MD;  Location: [facility];  Service: Gynecology;  Laterality: N/A;  . EUS N/A 09/09/2015   Procedure: UPPER ENDOSCOPIC ULTRASOUND (EUS) RADIAL;  Surgeon: [name], MD;  Location: [facility];  Service: Endoscopy;  Laterality: N/A;  . EYE SURGERY    . INCONTINENCE SURGERY    . SPINAL FUSION  2008   times two     OB History   None      Home Medications    Prior to Admission medications   Medication Sig Start Date End Date Taking? Authorizing Provider  ALPRAZolam Duanne Moron) 1 MG tablet Take 1 tablet (1 mg total) by mouth 3 (three) times daily as needed. anxiety 09/17/15   Clarene Reamer, MD  ARIPiprazole (ABILIFY) 5 MG tablet Take 1 tablet (5 mg total) by mouth daily. 09/17/15   Clarene Reamer, MD  Cholecalciferol (VITAMIN D) 2000 units tablet Take 2,000 Units by mouth daily.    [provider]  clomiPRAMINE (ANAFRANIL) 75 MG capsule Take 75 mg by mouth at bedtime.    [provider]  DULoxetine (CYMBALTA) 60 MG capsule Take 2 capsules (120 mg total) by mouth daily. 04/17/15   Niel Hummer, NP  gabapentin (NEURONTIN) 300 MG capsule Take 3 capsules (900 mg total) by mouth  at bedtime. 09/17/15   Clarene Reamer, MD  ibuprofen (ADVIL,MOTRIN) 200 MG tablet Take 200 mg by mouth every 6 (six) hours as needed for cramping.    [provider]  lamoTRIgine (LAMICTAL) 100 MG tablet Take 1 tablet (100 mg total) by mouth daily. 09/17/15   Clarene Reamer, MD  levothyroxine (SYNTHROID, LEVOTHROID) 50 MCG tablet Take 1 tablet (50 mcg total) by mouth daily. 04/17/15   Niel Hummer, NP  methylphenidate (RITALIN) 20 MG tablet Take 1 tablet (20 mg total) by mouth 2 (two) times daily with breakfast and lunch. Do not fill before 14 November 2015 09/17/15 09/16/16  Clarene Reamer, MD  Multiple Vitamins-Minerals (MULTIVITAMIN WITH MINERALS)  tablet Take 1 tablet by mouth daily.    [provider]  trazodone (DESYREL) 300 MG tablet Take 300 mg by mouth at bedtime.    [provider]    Family History Family History  Problem Relation Age of Onset  . Schizophrenia Father   . Schizophrenia Cousin     Social History Social History   Tobacco Use  . Smoking status: Former Smoker    Packs/day: 1.00    Types: Cigarettes    Last attempt to quit: 02/09/2016    Years since quitting: 2.5  . Smokeless tobacco: Never Used  . Tobacco comment: use vapor  Substance Use Topics  . Alcohol use: No  . Drug use: No     Allergies   Pregabalin; Celebrex [celecoxib]; Effexor [venlafaxine hcl]; Mirapex [pramipexole]; Pristiq [desvenlafaxine]; Prozac [fluoxetine hcl]; Symbyax [olanzapine-fluoxetine hcl]; and Wellbutrin [bupropion hcl]   Review of Systems Review of Systems  Constitutional: Positive for chills and fatigue. Negative for diaphoresis and fever.  HENT: Positive for congestion, rhinorrhea and sore throat.   Eyes: Negative for visual disturbance.  Respiratory: Positive for cough, chest tightness, shortness of breath and wheezing.   Cardiovascular: Negative for chest pain, palpitations and leg swelling.  Gastrointestinal: Negative for abdominal pain, constipation, diarrhea, nausea and vomiting.  Genitourinary: Negative for dysuria and flank pain.  Musculoskeletal: Negative for back pain and neck pain.  Neurological: Negative for light-headedness and headaches.  Psychiatric/Behavioral: Negative for agitation and confusion.  All other systems reviewed and are negative.    Physical Exam Updated Vital Signs BP (!) 150/73 (BP Location: Right Arm)   Pulse (!) 101   Temp 98.4 F (36.9 C) (Oral)   Resp 14   Ht 5\' 3"  (1.6 m)   Wt 123.8 kg   LMP 08/08/2018   SpO2 97%   BMI 48.36 kg/m   Physical Exam  Constitutional: She appears well-developed and well-nourished. No distress.  HENT:  Head: Normocephalic  and atraumatic.  Mouth/Throat: Posterior oropharyngeal erythema present. No oropharyngeal exudate.  Eyes: Pupils are equal, round, and reactive to light. Conjunctivae and EOM are normal.  Neck: Normal range of motion. Neck supple.  Cardiovascular: Normal rate, regular rhythm and intact distal pulses.  No murmur heard. Pulmonary/Chest: Effort normal. Tachypnea noted. No respiratory distress. She has wheezes. She has no rhonchi. She has no rales. She exhibits no tenderness.  Abdominal: Soft. She exhibits no distension. There is no tenderness.  Musculoskeletal: She exhibits no edema or tenderness.  Neurological: She is alert. No sensory deficit. She exhibits normal muscle tone.  Skin: Skin is warm and dry. Capillary refill takes less than 2 seconds. No rash noted. She is not diaphoretic. No erythema.  Psychiatric: She has a normal mood and affect.  Nursing note and vitals reviewed.  ED Treatments / Results  Labs (all labs ordered are listed, but only abnormal results are displayed) Labs Reviewed  GROUP A STREP BY PCR    EKG None  Radiology Dg Chest 2 View  Result Date: 08/13/2018 CLINICAL DATA:  Cough.  Chest pain. EXAM: CHEST - 2 VIEW COMPARISON:  Radiographs of September 21, 2011. FINDINGS: The heart size and mediastinal contours are within normal limits. Both lungs are clear. No pneumothorax or pleural effusion is noted. The visualized skeletal structures are unremarkable. IMPRESSION: No active cardiopulmonary disease. Electronically Signed   By: Marijo Conception, M.D.   On: 08/13/2018 13:07    Procedures Procedures (including critical care time)  Medications Ordered in ED Medications  ipratropium-albuterol (DUONEB) 0.5-2.5 (3) MG/3ML nebulizer solution 3 mL (3 mLs Nebulization Given 08/13/18 1249)  albuterol (PROVENTIL HFA;VENTOLIN HFA) 108 (90 Base) MCG/ACT inhaler 2 puff (2 puffs Inhalation Given 08/13/18 1508)  predniSONE (DELTASONE) tablet 60 mg (60 mg Oral Given 08/13/18  1309)     Initial Impression / Assessment and Plan / ED Course  I have reviewed the triage vital signs and the nursing notes.  Pertinent labs & imaging results that were available during my care of the patient were reviewed by me and considered in my medical decision making (see chart for details).     [name] is a 47 y.o. female with a past medical history significant for hypothyroidism, anxiety, depression, and bipolar disorder who presents with rhinorrhea, congestion, cough, sore throat, and fatigue.  Patient reports that her symptoms been ongoing for the last few days.  She reports that she saw her PCP who thought she had postnasal drip.  Patient says that her symptoms have continued to worsen and she is concerned about a wheezing sensation she is feeling as well as shortness of breath.  She is never had this before.  She denies any chest pain, abdominal pain, nausea, vomiting, urinary symptoms or other GI symptoms.  On exam, patient is wheezing in all lung fields.  No rhonchi are appreciated.  Chest is nontender.  Back is nontender.  Patient has audible congestion and visible rhinorrhea.  Abdomen is nontender.  Patient's legs are nonedematous.  Patient given a breathing treatment with significant improvement in wheezing.  Patient was feeling much better.  Patient will give steroids as although she does not history of asthma I suspect she has reactive airway disease exacerbated by recent URI.  Due to productive cough and x-ray was obtained showed no evidence of pneumonia.  Patient was feeling much better after breathing treatments and steroids.  Strep test also was negative.  Patient likely has URI exacerbating subtle underlying reactive airway disease.  Patient will follow-up with PCP and was given prescription for steroids.  Patient given an inhaler which she was instructed how to use with a spacer.  Patient felt much better and understood plan of care.  Patient understood strict  return precautions.  Patient no other questions or concerns and was discharged in good condition.    Final Clinical Impressions(s) / ED Diagnoses   Final diagnoses:  Viral URI with cough  Wheezing    ED Discharge Orders         Ordered    predniSONE (DELTASONE) 50 MG tablet  Daily     08/13/18 1511    guaiFENesin-codeine 100-10 MG/5ML syrup  Every 6 hours PRN     08/13/18 1511         Clinical Impression: 1. Viral URI with cough  2. Wheezing     Disposition: Discharge  Condition: Good  I have discussed the results, Dx and Tx plan with the pt(& family if present). He/she/they expressed understanding and agree(s) with the plan. Discharge instructions discussed at great length. Strict return precautions discussed and pt &/or family have verbalized understanding of the instructions. No further questions at time of discharge.    New Prescriptions   GUAIFENESIN-CODEINE 100-10 MG/5ML SYRUP    Take 10 mLs by mouth every 6 (six) hours as needed for cough.   PREDNISONE (DELTASONE) 50 MG TABLET    Take 1 tablet (50 mg total) by mouth daily for 4 days.    Follow Up: Thurman Coyer, MD [address] [place] [address] [phone]     [facility] [address] [id number] [facility] [place] [phone]       Tegeler, Gwenyth Allegra, MD 08/13/18 1640

## 2018-08-31 ENCOUNTER — Ambulatory Visit
Admission: RE | Admit: 2018-08-31 | Discharge: 2018-08-31 | Disposition: A | Discharge status: Home / Self Care | Payer: PPO | Source: Ambulatory Visit | Attending: Internal Medicine | Admitting: Internal Medicine

## 2018-08-31 DIAGNOSIS — Z1231 Encounter for screening mammogram for malignant neoplasm of breast: Secondary | ICD-10-CM | POA: Diagnosis not present

## 2018-09-19 DIAGNOSIS — F429 Obsessive-compulsive disorder, unspecified: Secondary | ICD-10-CM | POA: Diagnosis not present

## 2018-09-19 DIAGNOSIS — F3132 Bipolar disorder, current episode depressed, moderate: Secondary | ICD-10-CM | POA: Diagnosis not present

## 2018-09-19 DIAGNOSIS — F411 Generalized anxiety disorder: Secondary | ICD-10-CM | POA: Diagnosis not present

## 2018-10-23 ENCOUNTER — Ambulatory Visit (INDEPENDENT_AMBULATORY_CARE_PROVIDER_SITE_OTHER): Payer: PPO | Admitting: Orthopaedic Surgery

## 2018-10-23 ENCOUNTER — Ambulatory Visit (INDEPENDENT_AMBULATORY_CARE_PROVIDER_SITE_OTHER): Payer: PPO

## 2018-10-23 ENCOUNTER — Encounter (INDEPENDENT_AMBULATORY_CARE_PROVIDER_SITE_OTHER): Payer: Self-pay | Admitting: Orthopaedic Surgery

## 2018-10-23 DIAGNOSIS — M25552 Pain in left hip: Secondary | ICD-10-CM | POA: Diagnosis not present

## 2018-10-23 MED ORDER — METHYLPREDNISOLONE ACETATE 40 MG/ML IJ SUSP: 40.0000 mg | Freq: Once | INTRAMUSCULAR | Status: DC

## 2018-10-23 NOTE — Progress Notes (Signed)
Subjective: She is here for ultrasound-guided left hip injection for mild to moderate DJD.  Objective: Pain with passive internal hip rotation.  No tenderness over the greater trochanter.    Procedure: Ultrasound-guided left hip injection: After sterile prep with Betadine, injected 8 cc 1% lidocaine without epinephrine and 40 mg methylprednisolone using a 22-gauge spinal needle passing the needle through the iliofemoral ligament into the femoral head/neck junction.

## 2018-10-23 NOTE — Progress Notes (Signed)
Office Visit Note   Patient: [name]           Date of Birth: Nov 10, 1970           MRN: [id number] Visit Date: 10/23/2018              Requested by: Thurman Coyer, MD [address] [place], [place] [address] PCP: Thurman Coyer, MD   Assessment & Plan: Visit Diagnoses:  1. Pain in left hip     Plan: Impression is left hip pain suspect osteoarthritis exacerbation versus labral tear.  We will inject with corticosteroid today under ultrasound with Dr. Junius Roads.  Patient instructed to notify us if she does not notice any improvement over the next couple weeks at which point we would obtain an MR arthrogram.  Patient is agreeable to the plan.  Follow-Up Instructions: Return if symptoms worsen or fail to improve.   Orders:  Orders Placed This Encounter  Procedures  . XR HIP UNILAT W OR W/O PELVIS 2-3 VIEWS LEFT   No orders of the defined types were placed in this encounter.     Procedures: No procedures performed   Clinical Data: No additional findings.   Subjective: Chief Complaint  Patient presents with  . Left Hip - Pain    Jackilyn comes in today for left hip and groin pain for over 6 months.  She denies any injuries.  She denies any back pain associated with the groin pain.  She has had 6 back surgeries.  She denies any radicular symptoms.  She denies any pain on the lateral side.   Review of Systems  Constitutional: Negative.   HENT: Negative.   Eyes: Negative.   Respiratory: Negative.   Cardiovascular: Negative.   Endocrine: Negative.   Musculoskeletal: Negative.   Neurological: Negative.   Hematological: Negative.   Psychiatric/Behavioral: Negative.   All other systems reviewed and are negative.    Objective: Vital Signs: There were no vitals taken for this visit.  Physical Exam Vitals signs and nursing note reviewed.  Constitutional:      Appearance: She is well-developed.  HENT:     Head: Normocephalic and atraumatic.  Neck:   Musculoskeletal: Neck supple.  Pulmonary:     Effort: Pulmonary effort is normal.  Abdominal:     Palpations: Abdomen is soft.  Skin:    General: Skin is warm.     Capillary Refill: Capillary refill takes less than 2 seconds.  Neurological:     Mental Status: She is alert and oriented to person, place, and time.  Psychiatric:        Behavior: Behavior normal.        Thought Content: Thought content normal.        Judgment: Judgment normal.     Ortho Exam Left hip exam shows positive logroll and positive [place] and positive FADIR and positive Corky Sox.  Trochanteric bursa is nontender. Specialty Comments:  No specialty comments available.  Imaging: Xr Hip Unilat W Or W/o Pelvis 2-3 Views Left  Result Date: 10/23/2018 Mild osteoarthritis of left hip joint.    PMFS History: Patient Active Problem List   Diagnosis Date Noted  . Recurrent major depression-severe ([place]) 04/17/2015  . GAD (generalized anxiety disorder) 04/15/2015  . Bipolar depression ([place]) 04/15/2015  . Severe recurrent major depression w/psychotic features, mood-congruent ([place]) 04/14/2015  . Anxiety disorder 04/14/2015  . Major depressive disorder, recurrent severe without psychotic features ([place]) 04/14/2015  . Suicidal ideations   . Chronic LBP  01/02/2015  . Adult hypothyroidism 06/25/2012   Past Medical History:  Diagnosis Date  . ADHD (attention deficit hyperactivity disorder)   . Anemia    with pregnancy  . Anxiety   . Arthritis   . Bipolar disorder ([place])   . Chronic back pain greater than 3 months duration   . Depression   . Dysuria    has to have self in and out cath   . Eroded bladder suspension mesh ([place])   . Hepatitis    C  . Hypothyroidism   . Leg pain, bilateral   . Rosacea   . Sciatic nerve pain   . Social anxiety disorder   . Thyroid disease     Family History  Problem Relation Age of Onset  . Schizophrenia Father   . Schizophrenia Cousin     Past Surgical History:    Procedure Laterality Date  . BACK SURGERY     total of 6 back surgeries  . bladder mesh  2009  . DILITATION & CURRETTAGE/HYSTROSCOPY WITH NOVASURE ABLATION N/A 08/19/2016   Procedure: DILATATION & CURETTAGE WITH NOVASURE ABLATION;  Surgeon: [name], MD;  Location: [facility];  Service: Gynecology;  Laterality: N/A;  . EUS N/A 09/09/2015   Procedure: UPPER ENDOSCOPIC ULTRASOUND (EUS) RADIAL;  Surgeon: [name], MD;  Location: [facility];  Service: Endoscopy;  Laterality: N/A;  . EYE SURGERY    . INCONTINENCE SURGERY    . SPINAL FUSION  2008   times two   Social History   Occupational History  . Not on file  Tobacco Use  . Smoking status: Former Smoker    Packs/day: 1.00    Types: Cigarettes    Last attempt to quit: 02/09/2016    Years since quitting: 2.7  . Smokeless tobacco: Never Used  . Tobacco comment: use vapor  Substance and Sexual Activity  . Alcohol use: No  . Drug use: No  . Sexual activity: Not on file

## 2018-11-01 ENCOUNTER — Other Ambulatory Visit (HOSPITAL_BASED_OUTPATIENT_CLINIC_OR_DEPARTMENT_OTHER): Payer: Self-pay

## 2018-11-01 ENCOUNTER — Telehealth (INDEPENDENT_AMBULATORY_CARE_PROVIDER_SITE_OTHER): Payer: Self-pay | Admitting: Orthopaedic Surgery

## 2018-11-01 DIAGNOSIS — G4710 Hypersomnia, unspecified: Secondary | ICD-10-CM

## 2018-11-01 DIAGNOSIS — R0683 Snoring: Secondary | ICD-10-CM

## 2018-11-01 NOTE — Telephone Encounter (Signed)
See message below °

## 2018-11-01 NOTE — Telephone Encounter (Signed)
Ok to get mr arthrogram left hip

## 2018-11-01 NOTE — Telephone Encounter (Signed)
Patient called advised the cortisone injection did not work. Patient asked if she can be set up for an MRI. The number to contact patient is [phone]

## 2018-11-02 ENCOUNTER — Other Ambulatory Visit (INDEPENDENT_AMBULATORY_CARE_PROVIDER_SITE_OTHER): Payer: Self-pay

## 2018-11-02 DIAGNOSIS — M25552 Pain in left hip: Secondary | ICD-10-CM

## 2018-11-02 NOTE — Telephone Encounter (Signed)
Order made. Patient aware.  

## 2018-11-07 ENCOUNTER — Ambulatory Visit
Admission: RE | Admit: 2018-11-07 | Discharge: 2018-11-07 | Disposition: A | Discharge status: Home / Self Care | Payer: PPO | Source: Ambulatory Visit | Attending: Orthopaedic Surgery | Admitting: Orthopaedic Surgery

## 2018-11-07 DIAGNOSIS — M25552 Pain in left hip: Secondary | ICD-10-CM | POA: Diagnosis not present

## 2018-11-07 DIAGNOSIS — M1612 Unilateral primary osteoarthritis, left hip: Secondary | ICD-10-CM | POA: Diagnosis not present

## 2018-11-07 MED ORDER — IOPAMIDOL (ISOVUE-M 200) INJECTION 41%
15.0000 mL | Freq: Once | INTRAMUSCULAR | Status: AC
  Administered 2018-11-07: 15 mL via INTRA_ARTICULAR

## 2018-11-12 ENCOUNTER — Ambulatory Visit (INDEPENDENT_AMBULATORY_CARE_PROVIDER_SITE_OTHER): Payer: PPO | Admitting: Family Medicine

## 2018-11-12 ENCOUNTER — Encounter: Payer: Self-pay | Admitting: Family Medicine

## 2018-11-12 VITALS — BP 110/72 | HR 100 | Temp 98.4°F | Ht 63.0 in | Wt 276.0 lb

## 2018-11-12 DIAGNOSIS — I10 Essential (primary) hypertension: Secondary | ICD-10-CM

## 2018-11-12 DIAGNOSIS — E7800 Pure hypercholesterolemia, unspecified: Secondary | ICD-10-CM | POA: Diagnosis not present

## 2018-11-12 DIAGNOSIS — J301 Allergic rhinitis due to pollen: Secondary | ICD-10-CM | POA: Diagnosis not present

## 2018-11-12 DIAGNOSIS — J4520 Mild intermittent asthma, uncomplicated: Secondary | ICD-10-CM

## 2018-11-12 DIAGNOSIS — J45909 Unspecified asthma, uncomplicated: Secondary | ICD-10-CM | POA: Insufficient documentation

## 2018-11-12 DIAGNOSIS — E039 Hypothyroidism, unspecified: Secondary | ICD-10-CM

## 2018-11-12 LAB — LIPID PANEL
Cholesterol: 166 mg/dL (ref 0–200)
HDL: 35.8 mg/dL — ABNORMAL LOW (ref >39.00)
NonHDL: 130.23
Total CHOL/HDL Ratio: 5
Triglycerides: 221 mg/dL — ABNORMAL HIGH (ref 0.0–149.0)
VLDL: 44.2 mg/dL — ABNORMAL HIGH (ref 0.0–40.0)

## 2018-11-12 LAB — COMPREHENSIVE METABOLIC PANEL
ALT: 31 U/L (ref 0–35)
AST: 19 U/L (ref 0–37)
Albumin: 4.1 g/dL (ref 3.5–5.2)
Alkaline Phosphatase: 102 U/L (ref 39–117)
BUN: 9 mg/dL (ref 6–23)
CO2: 28 mEq/L (ref 19–32)
Calcium: 9.3 mg/dL (ref 8.4–10.5)
Chloride: 99 mEq/L (ref 96–112)
Creatinine, Ser: 0.85 mg/dL (ref 0.40–1.20)
GFR: 72.04 mL/min (ref >60.00)
Glucose, Bld: 156 mg/dL — ABNORMAL HIGH (ref 70–99)
Potassium: 3.9 mEq/L (ref 3.5–5.1)
Sodium: 135 mEq/L (ref 135–145)
Total Bilirubin: 0.4 mg/dL (ref 0.2–1.2)
Total Protein: 7 g/dL (ref 6.0–8.3)

## 2018-11-12 LAB — CBC
HCT: 37.9 % (ref 36.0–46.0)
Hemoglobin: 12.5 g/dL (ref 12.0–15.0)
MCHC: 33 g/dL (ref 30.0–36.0)
MCV: 83.8 fl (ref 78.0–100.0)
Platelets: 317 10*3/uL (ref 150.0–400.0)
RBC: 4.52 Mil/uL (ref 3.87–5.11)
RDW: 16.9 % — ABNORMAL HIGH (ref 11.5–15.5)
WBC: 9.6 10*3/uL (ref 4.0–10.5)

## 2018-11-12 LAB — TSH: TSH: 2.68 u[IU]/mL (ref 0.35–4.50)

## 2018-11-12 LAB — LDL CHOLESTEROL, DIRECT: Direct LDL: 116 mg/dL

## 2018-11-12 MED ORDER — ATORVASTATIN CALCIUM 20 MG PO TABS: 20.0000 mg | ORAL_TABLET | Freq: Every day | ORAL | 1 refills | Status: DC

## 2018-11-12 MED ORDER — LISINOPRIL 10 MG PO TABS: 10.0000 mg | ORAL_TABLET | Freq: Every day | ORAL | 1 refills | Status: DC

## 2018-11-12 NOTE — Progress Notes (Signed)
Established Patient Office Visit  Subjective:  Patient ID: [name], female    DOB: 04/24/1971  Age: 48 y.o. MRN: [id number]  CC:  Chief Complaint  Patient presents with  . Establish Care    HPI [name] presents for establishment of care and follow-up of her hypertension, elevated cholesterol and hypothyroidism.  Blood pressure and cholesterol have been well controlled.  She has been taking her thyroid medicine daily.  Patient is mostly fasting this morning she had black coffee with some cream in it.  Significant past medical history of multiple mental health issues.  She is seeing psychiatry.  She has a GYN provider for her Pap and pelvic exams.  She lives with her husband of 3 years.  They have been together over the last 7 years.  She has an 39 year old son and a 103 year old she has 2 dogs.  Her parents are in their upper 51s and live in [place].  Her father suffers from depression she believes.  Her mom is in good health as far she knows.  Patient has a history of lower back pain that is been treated successfully in the past with gabapentin.  She has a history of allergy rhinitis with associated reactive airway disease.  She does not smoke drink alcohol or use illicit drugs.  Past Medical History:  Diagnosis Date  . ADHD (attention deficit hyperactivity disorder)   . Anemia    with pregnancy  . Anxiety   . Arthritis   . Bipolar disorder ([place])   . Chronic back pain greater than 3 months duration   . Depression   . Dysuria    has to have self in and out cath   . Eroded bladder suspension mesh ([place])   . Hepatitis    C  . Hypothyroidism   . Leg pain, bilateral   . Rosacea   . Sciatic nerve pain   . Social anxiety disorder   . Thyroid disease     Past Surgical History:  Procedure Laterality Date  . BACK SURGERY     total of 6 back surgeries  . bladder mesh  2009  . DILITATION & CURRETTAGE/HYSTROSCOPY WITH NOVASURE ABLATION N/A 08/19/2016   Procedure:  DILATATION & CURETTAGE WITH NOVASURE ABLATION;  Surgeon: [name], MD;  Location: [facility];  Service: Gynecology;  Laterality: N/A;  . EUS N/A 09/09/2015   Procedure: UPPER ENDOSCOPIC ULTRASOUND (EUS) RADIAL;  Surgeon: [name], MD;  Location: [facility];  Service: Endoscopy;  Laterality: N/A;  . EYE SURGERY    . INCONTINENCE SURGERY    . SPINAL FUSION  2008   times two    Family History  Problem Relation Age of Onset  . Schizophrenia Father   . Schizophrenia Cousin     Social History   Socioeconomic History  . Marital status: Married    Spouse name: Not on file  . Number of children: Not on file  . Years of education: Not on file  . Highest education level: Not on file  Occupational History  . Not on file  Social Needs  . Financial resource strain: Not on file  . Food insecurity:    Worry: Not on file    Inability: Not on file  . Transportation needs:    Medical: Not on file    Non-medical: Not on file  Tobacco Use  . Smoking status: Former Smoker    Packs/day: 1.00    Types: Cigarettes    Last attempt to  quit: 02/09/2016    Years since quitting: 2.7  . Smokeless tobacco: Never Used  . Tobacco comment: use vapor  Substance and Sexual Activity  . Alcohol use: No  . Drug use: No  . Sexual activity: Not on file  Lifestyle  . Physical activity:    Days per week: Not on file    Minutes per session: Not on file  . Stress: Not on file  Relationships  . Social connections:    Talks on phone: Not on file    Gets together: Not on file    Attends religious service: Not on file    Active member of club or organization: Not on file    Attends meetings of clubs or organizations: Not on file    Relationship status: Not on file  . Intimate partner violence:    Fear of current or ex partner: Not on file    Emotionally abused: Not on file    Physically abused: Not on file    Forced sexual activity: Not on file  Other Topics Concern  . Not on file  Social  History Narrative  . Not on file    Outpatient Medications Prior to Visit  Medication Sig Dispense Refill  . ALPRAZolam (XANAX) 0.5 MG tablet Take 1 tablet by mouth 3 (three) times daily as needed for anxiety.    . ARIPiprazole (ABILIFY) 5 MG tablet Take 1 tablet (5 mg total) by mouth daily. 30 tablet 2  . clomiPRAMINE (ANAFRANIL) 75 MG capsule Take 75 mg by mouth at bedtime.    . lamoTRIgine (LAMICTAL) 100 MG tablet Take 1 tablet (100 mg total) by mouth daily. (Patient taking differently: Take 100 mg by mouth 2 (two) times daily. ) 30 tablet 2  . levothyroxine (SYNTHROID, LEVOTHROID) 75 MCG tablet Take 1 tablet by mouth daily.    . methylphenidate (RITALIN) 10 MG tablet Take 1 tablet by mouth 2 (two) times daily.    Marland Kitchen atorvastatin (LIPITOR) 20 MG tablet Take 1 tablet by mouth daily.    . Cholecalciferol (VITAMIN D) 2000 units tablet Take 2,000 Units by mouth daily.    . fluticasone (FLONASE) 50 MCG/ACT nasal spray Place 1 spray into both nostrils daily.    Marland Kitchen gabapentin (NEURONTIN) 300 MG capsule Take 3 capsules (900 mg total) by mouth at bedtime. 90 capsule 2  . lisinopril (PRINIVIL,ZESTRIL) 10 MG tablet Take 1 tablet by mouth daily.    . Multiple Vitamins-Minerals (MULTIVITAMIN WITH MINERALS) tablet Take 1 tablet by mouth daily.    . trazodone (DESYREL) 300 MG tablet Take 300 mg by mouth at bedtime.    . ALPRAZolam (XANAX) 1 MG tablet Take 1 tablet (1 mg total) by mouth 3 (three) times daily as needed. anxiety 60 tablet 1  . DULoxetine (CYMBALTA) 60 MG capsule Take 2 capsules (120 mg total) by mouth daily. 60 capsule 0  . guaiFENesin-codeine 100-10 MG/5ML syrup Take 10 mLs by mouth every 6 (six) hours as needed for cough. 120 mL 0  . ibuprofen (ADVIL,MOTRIN) 200 MG tablet Take 200 mg by mouth every 6 (six) hours as needed for cramping.    Marland Kitchen levothyroxine (SYNTHROID, LEVOTHROID) 50 MCG tablet Take 1 tablet (50 mcg total) by mouth daily.    . methylphenidate (RITALIN) 20 MG tablet Take 1  tablet (20 mg total) by mouth 2 (two) times daily with breakfast and lunch. Do not fill before 14 November 2015 60 tablet 0  . methylPREDNISolone acetate (DEPO-MEDROL) injection 40 mg  No facility-administered medications prior to visit.     Allergies  Allergen Reactions  . Pregabalin Anaphylaxis  . Celebrex [Celecoxib] Nausea And Vomiting    Stomach pain  . Effexor [Venlafaxine Hcl] Other (See Comments)    Serotonin toxicity  . Mirapex [Pramipexole]     Mirapex- Worsening "body jerks"  . Pristiq [Desvenlafaxine] Other (See Comments)    Serotonin toxicity   . Prozac [Fluoxetine Hcl] Swelling  . Symbyax [Olanzapine-Fluoxetine Hcl] Swelling  . Wellbutrin [Bupropion Hcl] Swelling    ROS Review of Systems  Constitutional: Negative.   HENT: Positive for sneezing.   Eyes: Positive for itching.  Respiratory: Negative.   Cardiovascular: Negative.   Gastrointestinal: Negative.   Endocrine: Negative for polyphagia and polyuria.  Genitourinary: Negative.   Musculoskeletal: Positive for back pain.  Skin: Negative for pallor and rash.  Allergic/Immunologic: Negative for immunocompromised state.  Neurological: Negative for seizures, light-headedness and numbness.  Hematological: Negative.       Objective:    Physical Exam  Constitutional: She is oriented to person, place, and time. She appears well-developed and well-nourished. No distress.  HENT:  Head: Normocephalic and atraumatic.  Right Ear: External ear normal.  Left Ear: External ear normal.  Mouth/Throat: Oropharynx is clear and moist. No oropharyngeal exudate.  Eyes: Pupils are equal, round, and reactive to light. Conjunctivae are normal. Right eye exhibits no discharge. Left eye exhibits no discharge. No scleral icterus.  Neck: Neck supple. No JVD present. No tracheal deviation present. No thyromegaly present.  Cardiovascular: Normal rate, regular rhythm and normal heart sounds.  Pulmonary/Chest: Effort normal and  breath sounds normal. No stridor.  Abdominal: Soft. Bowel sounds are normal.  Lymphadenopathy:    She has no cervical adenopathy.  Neurological: She is alert and oriented to person, place, and time.  Skin: Skin is warm and dry. She is not diaphoretic.  Psychiatric: She has a normal mood and affect. Her behavior is normal.    BP 110/72   Pulse 100   Temp 98.4 F (36.9 C) (Oral)   Ht 5\' 3"  (1.6 m)   Wt 276 lb (125.2 kg)   LMP 11/07/2018 (Exact Date)   SpO2 99%   BMI 48.89 kg/m  Wt Readings from Last 3 Encounters:  11/12/18 276 lb (125.2 kg)  08/13/18 273 lb (123.8 kg)  12/15/17 273 lb (123.8 kg)   BP Readings from Last 3 Encounters:  11/12/18 110/72  08/13/18 113/60  12/15/17 100/88   Guideline developer:  UpToDate (see UpToDate for funding source) Date Released: June 2014  Health Maintenance Due  Topic Date Due  . HIV Screening  12/19/1987  . INFLUENZA VACCINE  04/05/2018  . TETANUS/TDAP  10/15/2018    There are no preventive care reminders to display for this patient.  Lab Results  Component Value Date   TSH 1.907 04/15/2015   Lab Results  Component Value Date   WBC 8.9 08/10/2016   HGB 13.0 08/10/2016   HCT 39.1 08/10/2016   MCV 85.7 08/10/2016   PLT 252 08/10/2016   Lab Results  Component Value Date   NA 135 12/15/2017   K 3.9 12/15/2017   CO2 26 12/15/2017   GLUCOSE 118 (H) 12/15/2017   BUN 9 12/15/2017   CREATININE 0.70 12/15/2017   BILITOT 0.2 (L) 07/21/2015   ALKPHOS 126 07/21/2015   AST 164 (H) 07/21/2015   ALT 587 (H) 07/21/2015   PROT 6.4 (L) 07/21/2015   ALBUMIN 3.4 (L) 07/21/2015   CALCIUM 9.1 12/15/2017  ANIONGAP 5 12/15/2017   Lab Results  Component Value Date   CHOL 247 (H) 04/15/2015   Lab Results  Component Value Date   HDL 37 (L) 04/15/2015   Lab Results  Component Value Date   LDLCALC 174 (H) 04/15/2015   Lab Results  Component Value Date   TRIG 179 (H) 04/15/2015   Lab Results  Component Value Date   CHOLHDL  6.7 04/15/2015   No results found for: HGBA1C    Assessment & Plan:   Problem List Items Addressed This Visit      Cardiovascular and Mediastinum   Essential hypertension   Relevant Medications   atorvastatin (LIPITOR) 20 MG tablet   lisinopril (PRINIVIL,ZESTRIL) 10 MG tablet   Other Relevant Orders   CBC   Comprehensive metabolic panel     Respiratory   Allergic rhinitis due to pollen   Reactive airway disease     Endocrine   Adult hypothyroidism - Primary   Relevant Medications   levothyroxine (SYNTHROID, LEVOTHROID) 75 MCG tablet   Other Relevant Orders   TSH     Other   Elevated cholesterol   Relevant Medications   atorvastatin (LIPITOR) 20 MG tablet   lisinopril (PRINIVIL,ZESTRIL) 10 MG tablet   Other Relevant Orders   Comprehensive metabolic panel   Lipid panel      Meds ordered this encounter  Medications  . atorvastatin (LIPITOR) 20 MG tablet    Sig: Take 1 tablet (20 mg total) by mouth daily.    Dispense:  100 tablet    Refill:  1  . lisinopril (PRINIVIL,ZESTRIL) 10 MG tablet    Sig: Take 1 tablet (10 mg total) by mouth daily.    Dispense:  100 tablet    Refill:  1    Follow-up: Return in about 6 months (around 05/15/2019).

## 2018-11-13 ENCOUNTER — Ambulatory Visit (INDEPENDENT_AMBULATORY_CARE_PROVIDER_SITE_OTHER): Payer: PPO | Admitting: Orthopaedic Surgery

## 2018-11-13 ENCOUNTER — Encounter (INDEPENDENT_AMBULATORY_CARE_PROVIDER_SITE_OTHER): Payer: Self-pay | Admitting: Orthopaedic Surgery

## 2018-11-13 ENCOUNTER — Other Ambulatory Visit: Payer: Self-pay | Admitting: Family Medicine

## 2018-11-13 VITALS — Ht 63.0 in | Wt 276.0 lb

## 2018-11-13 DIAGNOSIS — R634 Abnormal weight loss: Secondary | ICD-10-CM

## 2018-11-13 DIAGNOSIS — Z6841 Body Mass Index (BMI) 40.0 and over, adult: Secondary | ICD-10-CM | POA: Insufficient documentation

## 2018-11-13 DIAGNOSIS — M1612 Unilateral primary osteoarthritis, left hip: Secondary | ICD-10-CM | POA: Diagnosis not present

## 2018-11-13 MED ORDER — MELOXICAM 7.5 MG PO TABS: 7.5000 mg | ORAL_TABLET | Freq: Two times a day (BID) | ORAL | 2 refills | Status: DC | PRN

## 2018-11-13 MED ORDER — LEVOTHYROXINE SODIUM 75 MCG PO TABS: 75.0000 ug | ORAL_TABLET | Freq: Every day | ORAL | 3 refills | Status: DC

## 2018-11-13 NOTE — Telephone Encounter (Signed)
Requested medication (s) are due for refill today: Yes  Requested medication (s) are on the active medication list: Yes  Last refill:  08/2018  Future visit scheduled: No  Notes to clinic:  Unable to refill per protocol, last refilled by historical provider.     Requested Prescriptions  Pending Prescriptions Disp Refills   levothyroxine (SYNTHROID, LEVOTHROID) 75 MCG tablet      Sig: Take 1 tablet (75 mcg total) by mouth daily.     Endocrinology:  Hypothyroid Agents Failed - 11/13/2018  3:26 PM      Failed - TSH needs to be rechecked within 3 months after an abnormal result. Refill until TSH is due.      Passed - TSH in normal range and within 360 days    TSH  Date Value Ref Range Status  11/12/2018 2.68 0.35 - 4.50 uIU/mL Final         Passed - Valid encounter within last 12 months    Recent Outpatient Visits          Yesterday Adult hypothyroidism   [facility] Ethelene Hal, Mortimer Fries, MD      Future Appointments            In 6 months Ethelene Hal, Mortimer Fries, MD [facility]

## 2018-11-13 NOTE — Telephone Encounter (Signed)
Copied from [place] [id number]. Topic: Quick Communication - Rx Refill/Question >> Nov 13, 2018  3:22 PM [place], [place] D wrote: Medication: levothyroxine (SYNTHROID, LEVOTHROID) 75 MCG tablet / Pt stated that the pharmacy could not refill because last rx was from historical provider. New rx needed from Dr. Ethelene Hal  Has the patient contacted their pharmacy? Yes.   (Agent: If no, request that the patient contact the pharmacy for the refill.) (Agent: If yes, when and what did the pharmacy advise?)  Preferred Pharmacy (with phone number or street name):   [id number] [address], [place] [phone] (Phone) [phone] (Fax)    Agent: Please be advised that RX refills may take up to 3 business days. We ask that you follow-up with your pharmacy.

## 2018-11-13 NOTE — Progress Notes (Signed)
Office Visit Note   Patient: [name]           Date of Birth: 1971/01/14           MRN: [id number] Visit Date: 11/13/2018              Requested by: Thurman Coyer, MD [address] [place], [place] [address] PCP: Libby Maw, MD   Assessment & Plan: Visit Diagnoses:  1. Primary osteoarthritis of left hip   2. Body mass index 45.0-49.9, adult ([place])   3. Morbid obesity ([place])     Plan: MR arthrogram of the left hip shows a degenerative labral tear as well as generalized thinning of the cartilage with areas of high-grade partial-thickness and full-thickness cartilage loss on both the femoral and the acetabular side.  These findings were discussed with the patient.  She understands that she has degenerative joint disease for which physical therapy and weight loss will help with the symptoms.  Questions encouraged and answered.  Prescription for meloxicam. The patient meets the AMA guidelines for Morbid (severe) obesity with a BMI > 40.0 and I have recommended weight loss.  Follow-Up Instructions: Return if symptoms worsen or fail to improve.   Orders:  No orders of the defined types were placed in this encounter.  Meds ordered this encounter  Medications  . meloxicam (MOBIC) 7.5 MG tablet    Sig: Take 1 tablet (7.5 mg total) by mouth 2 (two) times daily as needed for pain.    Dispense:  60 tablet    Refill:  2      Procedures: No procedures performed   Clinical Data: No additional findings.   Subjective: Chief Complaint  Patient presents with  . Left Hip - Follow-up    MRI review     [name] returns today for MRI review of her left hip.  She has received a cortisone injection which gave moderate relief.   Review of Systems  Constitutional: Negative.   HENT: Negative.   Eyes: Negative.   Respiratory: Negative.   Cardiovascular: Negative.   Endocrine: Negative.   Musculoskeletal: Negative.   Neurological: Negative.   Hematological: Negative.    Psychiatric/Behavioral: Negative.   All other systems reviewed and are negative.    Objective: Vital Signs: Ht 5\' 3"  (1.6 m)   Wt 276 lb (125.2 kg)   LMP 11/07/2018 (Exact Date)   BMI 48.89 kg/m   Physical Exam Vitals signs and nursing note reviewed.  Constitutional:      Appearance: She is well-developed.  Pulmonary:     Effort: Pulmonary effort is normal.  Skin:    General: Skin is warm.     Capillary Refill: Capillary refill takes less than 2 seconds.  Neurological:     Mental Status: She is alert and oriented to person, place, and time.  Psychiatric:        Behavior: Behavior normal.        Thought Content: Thought content normal.        Judgment: Judgment normal.     Ortho Exam Left hip exam is unchanged. Specialty Comments:  No specialty comments available.  Imaging: No results found.   PMFS History: Patient Active Problem List   Diagnosis Date Noted  . Primary osteoarthritis of left hip 11/13/2018  . Elevated cholesterol 11/12/2018  . Essential hypertension 11/12/2018  . Allergic rhinitis due to pollen 11/12/2018  . Reactive airway disease 11/12/2018  . Recurrent major depression-severe ([place]) 04/17/2015  . GAD (generalized  anxiety disorder) 04/15/2015  . Bipolar depression ([place]) 04/15/2015  . Severe recurrent major depression w/psychotic features, mood-congruent ([place]) 04/14/2015  . Anxiety disorder 04/14/2015  . Major depressive disorder, recurrent severe without psychotic features ([place]) 04/14/2015  . Suicidal ideations   . Chronic LBP 01/02/2015  . Adult hypothyroidism 06/25/2012   Past Medical History:  Diagnosis Date  . ADHD (attention deficit hyperactivity disorder)   . Anemia    with pregnancy  . Anxiety   . Arthritis   . Bipolar disorder ([place])   . Chronic back pain greater than 3 months duration   . Depression   . Dysuria    has to have self in and out cath   . Eroded bladder suspension mesh ([place])   . Hepatitis    C  .  Hypothyroidism   . Leg pain, bilateral   . Rosacea   . Sciatic nerve pain   . Social anxiety disorder   . Thyroid disease     Family History  Problem Relation Age of Onset  . Schizophrenia Father   . Schizophrenia Cousin     Past Surgical History:  Procedure Laterality Date  . BACK SURGERY     total of 6 back surgeries  . bladder mesh  2009  . DILITATION & CURRETTAGE/HYSTROSCOPY WITH NOVASURE ABLATION N/A 08/19/2016   Procedure: DILATATION & CURETTAGE WITH NOVASURE ABLATION;  Surgeon: [name], MD;  Location: [facility];  Service: Gynecology;  Laterality: N/A;  . EUS N/A 09/09/2015   Procedure: UPPER ENDOSCOPIC ULTRASOUND (EUS) RADIAL;  Surgeon: [name], MD;  Location: [facility];  Service: Endoscopy;  Laterality: N/A;  . EYE SURGERY    . INCONTINENCE SURGERY    . SPINAL FUSION  2008   times two   Social History   Occupational History  . Not on file  Tobacco Use  . Smoking status: Former Smoker    Packs/day: 1.00    Types: Cigarettes    Last attempt to quit: 02/09/2016    Years since quitting: 2.7  . Smokeless tobacco: Never Used  . Tobacco comment: use vapor  Substance and Sexual Activity  . Alcohol use: No  . Drug use: No  . Sexual activity: Not on file

## 2018-11-14 ENCOUNTER — Ambulatory Visit (HOSPITAL_BASED_OUTPATIENT_CLINIC_OR_DEPARTMENT_OTHER): Payer: PPO | Attending: Internal Medicine | Admitting: Internal Medicine

## 2018-11-14 ENCOUNTER — Other Ambulatory Visit: Payer: Self-pay

## 2018-11-14 VITALS — Ht 64.0 in | Wt 272.0 lb

## 2018-11-14 DIAGNOSIS — G4733 Obstructive sleep apnea (adult) (pediatric): Secondary | ICD-10-CM

## 2018-11-14 DIAGNOSIS — R0683 Snoring: Secondary | ICD-10-CM

## 2018-11-14 DIAGNOSIS — G4710 Hypersomnia, unspecified: Secondary | ICD-10-CM

## 2018-11-25 ENCOUNTER — Other Ambulatory Visit: Payer: Self-pay

## 2018-11-25 ENCOUNTER — Encounter (HOSPITAL_BASED_OUTPATIENT_CLINIC_OR_DEPARTMENT_OTHER): Payer: Self-pay | Admitting: Emergency Medicine

## 2018-11-25 ENCOUNTER — Emergency Department (HOSPITAL_BASED_OUTPATIENT_CLINIC_OR_DEPARTMENT_OTHER)
Admission: EM | Admit: 2018-11-25 | Discharge: 2018-11-25 | Disposition: A | Discharge status: Home / Self Care | Payer: PPO | Attending: Emergency Medicine | Admitting: Emergency Medicine

## 2018-11-25 DIAGNOSIS — F909 Attention-deficit hyperactivity disorder, unspecified type: Secondary | ICD-10-CM | POA: Diagnosis not present

## 2018-11-25 DIAGNOSIS — R059 Cough, unspecified: Secondary | ICD-10-CM

## 2018-11-25 DIAGNOSIS — E039 Hypothyroidism, unspecified: Secondary | ICD-10-CM | POA: Insufficient documentation

## 2018-11-25 DIAGNOSIS — F419 Anxiety disorder, unspecified: Secondary | ICD-10-CM | POA: Diagnosis not present

## 2018-11-25 DIAGNOSIS — F319 Bipolar disorder, unspecified: Secondary | ICD-10-CM | POA: Diagnosis not present

## 2018-11-25 DIAGNOSIS — R0981 Nasal congestion: Secondary | ICD-10-CM | POA: Diagnosis present

## 2018-11-25 DIAGNOSIS — R05 Cough: Secondary | ICD-10-CM | POA: Diagnosis not present

## 2018-11-25 DIAGNOSIS — Z87891 Personal history of nicotine dependence: Secondary | ICD-10-CM | POA: Insufficient documentation

## 2018-11-25 DIAGNOSIS — J069 Acute upper respiratory infection, unspecified: Secondary | ICD-10-CM | POA: Diagnosis not present

## 2018-11-25 MED ORDER — PREDNISONE 20 MG PO TABS: 40.0000 mg | ORAL_TABLET | Freq: Every day | ORAL | 0 refills | Status: AC

## 2018-11-25 MED ORDER — ALBUTEROL SULFATE HFA 108 (90 BASE) MCG/ACT IN AERS: 1.0000 | INHALATION_SPRAY | Freq: Four times a day (QID) | RESPIRATORY_TRACT | 0 refills | Status: DC | PRN

## 2018-11-25 NOTE — ED Triage Notes (Addendum)
Pt c/o runny nose, cough, and wheezing since Thursday. She thinks it is allergies. Currently on abx following dental procedure.

## 2018-11-25 NOTE — Discharge Instructions (Signed)

## 2018-11-25 NOTE — ED Provider Notes (Signed)
Emergency Department Provider Note   I have reviewed the triage vital signs and the nursing notes.   HISTORY  Chief Complaint Cough   HPI [name] is a 48 y.o. female with PMH of Bipolar and HTN is to the emergency department for evaluation of nasal congestion, cough.  Symptoms are similar to allergy type symptoms which he has had in the past.  She did take a tympanic temperature measurement today which registered 100.1 F.  She is not feeling body aches or shortness of breath.  No chest pain.  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  No recent travel or known COVID exposure.  Feels some subjective wheezing but denies a smoking history.   Past Medical History:  Diagnosis Date  . ADHD (attention deficit hyperactivity disorder)   . Anemia    with pregnancy  . Anxiety   . Arthritis   . Bipolar disorder ([place])   . Chronic back pain greater than 3 months duration   . Depression   . Dysuria    has to have self in and out cath   . Eroded bladder suspension mesh ([place])   . Hepatitis    C  . Hypothyroidism   . Leg pain, bilateral   . Rosacea   . Sciatic nerve pain   . Social anxiety disorder   . Thyroid disease     Patient Active Problem List   Diagnosis Date Noted  . Primary osteoarthritis of left hip 11/13/2018  . Body mass index 45.0-49.9, adult ([place]) 11/13/2018  . Morbid obesity ([place]) 11/13/2018  . Elevated cholesterol 11/12/2018  . Essential hypertension 11/12/2018  . Allergic rhinitis due to pollen 11/12/2018  . Reactive airway disease 11/12/2018  . Recurrent major depression-severe ([place]) 04/17/2015  . GAD (generalized anxiety disorder) 04/15/2015  . Bipolar depression ([place]) 04/15/2015  . Severe recurrent major depression w/psychotic features, mood-congruent ([place]) 04/14/2015  . Anxiety disorder 04/14/2015  . Major depressive disorder, recurrent severe without psychotic features ([place]) 04/14/2015  . Suicidal ideations   . Chronic LBP 01/02/2015  . Adult hypothyroidism  06/25/2012    Past Surgical History:  Procedure Laterality Date  . BACK SURGERY     total of 6 back surgeries  . bladder mesh  2009  . DILITATION & CURRETTAGE/HYSTROSCOPY WITH NOVASURE ABLATION N/A 08/19/2016   Procedure: DILATATION & CURETTAGE WITH NOVASURE ABLATION;  Surgeon: [name], MD;  Location: [facility];  Service: Gynecology;  Laterality: N/A;  . EUS N/A 09/09/2015   Procedure: UPPER ENDOSCOPIC ULTRASOUND (EUS) RADIAL;  Surgeon: [name], MD;  Location: [facility];  Service: Endoscopy;  Laterality: N/A;  . EYE SURGERY    . INCONTINENCE SURGERY    . SPINAL FUSION  2008   times two   Allergies Pregabalin; Celebrex [celecoxib]; Effexor [venlafaxine hcl]; Mirapex [pramipexole]; Pristiq [desvenlafaxine]; Prozac [fluoxetine hcl]; Symbyax [olanzapine-fluoxetine hcl]; and Wellbutrin [bupropion hcl]  Family History  Problem Relation Age of Onset  . Schizophrenia Father   . Schizophrenia Cousin     Social History Social History   Tobacco Use  . Smoking status: Former Smoker    Packs/day: 1.00    Types: Cigarettes    Last attempt to quit: 02/09/2016    Years since quitting: 2.7  . Smokeless tobacco: Never Used  . Tobacco comment: use vapor  Substance Use Topics  . Alcohol use: No  . Drug use: No    Review of Systems  Constitutional: No fever/chills Eyes: No visual changes. ENT: No sore throat. Positive congestion.  Cardiovascular: Denies chest pain. Respiratory: Denies shortness of breath. Positive cough.  Gastrointestinal: No abdominal pain.  No nausea, no vomiting.  No diarrhea.  No constipation. Genitourinary: Negative for dysuria. Musculoskeletal: Negative for back pain. Skin: Negative for rash. Neurological: Negative for headaches, focal weakness or numbness.  10-point ROS otherwise negative.  ____________________________________________   PHYSICAL EXAM:  VITAL SIGNS: ED Triage Vitals  Enc Vitals Group     BP 11/25/18 1814 (!) 144/85      Pulse Rate 11/25/18 1814 96     Resp 11/25/18 1814 20     Temp 11/25/18 1814 97.9 F (36.6 C)     Temp Source 11/25/18 1814 Oral     SpO2 11/25/18 1814 99 %     Weight 11/25/18 1815 272 lb (123.4 kg)     Height 11/25/18 1815 5\' 4"  (1.626 m)   Constitutional: Alert and oriented. Well appearing and in no acute distress. Eyes: Conjunctivae are normal.  Head: Atraumatic. Nose: Mild congestion/rhinnorhea. Mouth/Throat: Mucous membranes are moist.  Neck: No stridor.  Cardiovascular: Normal rate, regular rhythm. Good peripheral circulation. Grossly normal heart sounds.   Respiratory: Normal respiratory effort.  No retractions. Lungs CTAB. Gastrointestinal: No distention.  Musculoskeletal: No gross deformities of extremities. Neurologic:  Normal speech and language. No gross focal neurologic deficits are appreciated.  Skin:  Skin is warm, dry and intact. No rash noted.   ____________________________________________  RADIOLOGY  None  ____________________________________________   PROCEDURES  Procedure(s) performed:   Procedures  None  ____________________________________________   INITIAL IMPRESSION / ASSESSMENT AND PLAN / ED COURSE  Pertinent labs & imaging results that were available during my care of the patient were reviewed by me and considered in my medical decision making (see chart for details).  Presents to the emergency department with URI symptoms.  She feels these are most consistent with her allergy symptoms.  She is already taking Flonase and over-the-counter medications for symptoms with no relief.  Had relief in the past with albuterol and steroid burst prescribed.  She has even, unlabored respirations without hypoxemia.  Chest x-ray at this time.  Plan for supportive care.  She did register a borderline fever at home today.  I have advised 2 weeks of self quarantine but patient is low risk for COVID.    ____________________________________________  FINAL  CLINICAL IMPRESSION(S) / ED DIAGNOSES  Final diagnoses:  Cough  Viral upper respiratory tract infection    NEW OUTPATIENT MEDICATIONS STARTED DURING THIS VISIT:  New Prescriptions   ALBUTEROL (PROVENTIL HFA;VENTOLIN HFA) 108 (90 BASE) MCG/ACT INHALER    Inhale 1-2 puffs into the lungs every 6 (six) hours as needed for wheezing or shortness of breath.   PREDNISONE (DELTASONE) 20 MG TABLET    Take 2 tablets (40 mg total) by mouth daily for 5 days.    Note:  This document was prepared using Dragon voice recognition software and may include unintentional dictation errors.  Nanda Quinton, MD Emergency Medicine    Long, [facility], MD 11/25/18 [phone]

## 2018-11-29 DIAGNOSIS — B1920 Unspecified viral hepatitis C without hepatic coma: Secondary | ICD-10-CM | POA: Diagnosis not present

## 2018-12-01 DIAGNOSIS — R0683 Snoring: Secondary | ICD-10-CM | POA: Diagnosis not present

## 2018-12-01 NOTE — Procedures (Signed)
   Patient Name: [name], [name] Date: 11/14/2018 Gender: Female D.O.B: October 30, 1970 Age (years): 23 Referring Provider: Margarito Courser MD Height (inches): 38 Interpreting Physician: Baird Lyons MD, ABSM Weight (lbs): 272 RPSGT: Jacolyn Reedy BMI: 47 MRN: [id number] Neck Size: 14.50  CLINICAL INFORMATION Sleep Study Type: HST Indication for sleep study: Excessive Daytime Sleepiness, Snoring Epworth Sleepiness Score: 8  SLEEP STUDY TECHNIQUE A multi-channel overnight portable sleep study was performed. The channels recorded were: nasal airflow, thoracic respiratory movement, and oxygen saturation with a pulse oximetry. Snoring was also monitored.  MEDICATIONS Patient self administered medications include: none reported.  SLEEP ARCHITECTURE Patient was studied for 424.5 minutes. The sleep efficiency was 100.0 % and the patient was supine for 63.7%. The arousal index was 0.0 per hour.  RESPIRATORY PARAMETERS The overall AHI was 50.9 per hour, with a central apnea index of 2.3 per hour. The oxygen nadir was 77% during sleep.  CARDIAC DATA Mean heart rate during sleep was 86.8 bpm.  IMPRESSIONS - Severe obstructive sleep apnea occurred during this study (AHI = 50.9/h). - No significant central sleep apnea occurred during this study (CAI = 2.3/h). - Severe oxygen desaturation was noted during this study (Min O2 = 77%). Mean sat 95%. - Patient snored.  DIAGNOSIS - Obstructive Sleep Apnea (327.23 [G47.33 ICD-10])  RECOMMENDATIONS - Suggest CPAP titration sleep study or autopap. Other options including Sleep medical consultation, a fitted oral appliance, or ENT evaluation, would be based on clinical judgment. - Be careful wih alcohol, sedatives and other CNS depressants that may worsen sleep apnea and disrupt normal sleep architecture. - Sleep hygiene should be reviewed to assess factors that may improve sleep quality. - Weight management and regular exercise should be  initiated or continued.  [Electronically signed] 12/01/2018 11:16 AM  Baird Lyons MD, ABSM Diplomate, American Board of Sleep Medicine   NPI: [id number]                          [facility]  ELECTRONICALLY SIGNED ON:  12/01/2018, 11:12 AM Welcome PH: [phone]   FX: [phone] [place]

## 2018-12-10 ENCOUNTER — Encounter: Payer: Self-pay | Admitting: Family Medicine

## 2018-12-10 ENCOUNTER — Ambulatory Visit (INDEPENDENT_AMBULATORY_CARE_PROVIDER_SITE_OTHER): Payer: PPO | Admitting: Family Medicine

## 2018-12-10 ENCOUNTER — Other Ambulatory Visit: Payer: Self-pay

## 2018-12-10 VITALS — Ht 64.0 in

## 2018-12-10 DIAGNOSIS — G4733 Obstructive sleep apnea (adult) (pediatric): Secondary | ICD-10-CM

## 2018-12-10 DIAGNOSIS — J0110 Acute frontal sinusitis, unspecified: Secondary | ICD-10-CM | POA: Diagnosis not present

## 2018-12-10 DIAGNOSIS — J301 Allergic rhinitis due to pollen: Secondary | ICD-10-CM

## 2018-12-10 MED ORDER — CETIRIZINE HCL 10 MG PO TABS: 10.0000 mg | ORAL_TABLET | Freq: Every day | ORAL | 11 refills | Status: DC

## 2018-12-10 MED ORDER — PREDNISONE 10 MG PO TABS: 10.0000 mg | ORAL_TABLET | Freq: Two times a day (BID) | ORAL | 0 refills | Status: AC

## 2018-12-10 MED ORDER — AMOXICILLIN 500 MG PO CAPS: 500.0000 mg | ORAL_CAPSULE | Freq: Three times a day (TID) | ORAL | 0 refills | Status: DC

## 2018-12-10 NOTE — Progress Notes (Addendum)
Virtual Visit via Telephone Note  I connected with [name] on 12/11/18 at 10:00 AM EDT by telephone and verified that I am speaking with the correct person using two identifiers.   I discussed the limitations, risks, security and privacy concerns of performing an evaluation and management service by telephone and the availability of in person appointments. I also discussed with the patient that there may be a patient responsible charge related to this service. The patient expressed understanding and agreed to proceed.  Interactive video and audio telecommunications were attempted between myself and the patient. However they failed due to the patient having technical difficulties or not having access to video capability. We continued and completed with audio only. History of Present Illness:    Observations/Objective:   Assessment and Plan:   Follow Up Instructions:    I discussed the assessment and treatment plan with the patient. The patient was provided an opportunity to ask questions and all were answered. The patient agreed with the plan and demonstrated an understanding of the instructions.   The patient was advised to call back or seek an in-person evaluation if the symptoms worsen or if the condition fails to improve as anticipated.  I provided 15 minutes of non-face-to-face time during this encounter.  Patient will continue her Flonase.  She will discontinue the Benadryl and take the Zyrtec instead.  She will discontinue her penicillin and take amoxicillin instead.  She will go ahead and start the prednisone.   Established Patient Office Visit  Subjective:  Patient ID: [name], female    DOB: 1970/11/03  Age: 48 y.o. MRN: [id number]  CC:  Chief Complaint  Patient presents with  . Cough    ongoing since 12/8, temp of 99 last night, productive cough with green mucous, not happening as much now.  . Headache    HPI [name] presents for evaluation and  treatment of ongoing history of itchy watery eyes postnasal drip with scratchy throat and cough.  She has been treating this with nighttime Benadryl and Flonase.  She was seen in the emergency room a couple of weeks ago and received treatment for reactive airway disease with an albuterol inhaler and a short course of prednisone.  She tells that she is no longer wheezing but is now experiencing pressure in the frontal area of her face and behind her eyes.  There is scant purulent postnasal drip.  She reports elevated temperatures to 99 degrees.  She is no longer wheezing at this time.  She is currently taking penicillin after a recent tooth extraction.  She has no tobacco history.  Past Medical History:  Diagnosis Date  . ADHD (attention deficit hyperactivity disorder)   . Anemia    with pregnancy  . Anxiety   . Arthritis   . Bipolar disorder ([place])   . Chronic back pain greater than 3 months duration   . Depression   . Dysuria    has to have self in and out cath   . Eroded bladder suspension mesh ([place])   . Hepatitis    C  . Hypothyroidism   . Leg pain, bilateral   . Rosacea   . Sciatic nerve pain   . Social anxiety disorder   . Thyroid disease     Past Surgical History:  Procedure Laterality Date  . BACK SURGERY     total of 6 back surgeries  . bladder mesh  2009  . [place]  N/A 08/19/2016   Procedure: DILATATION & CURETTAGE WITH NOVASURE ABLATION;  Surgeon: [name], MD;  Location: [facility];  Service: Gynecology;  Laterality: N/A;  . EUS N/A 09/09/2015   Procedure: UPPER ENDOSCOPIC ULTRASOUND (EUS) RADIAL;  Surgeon: [name], MD;  Location: [facility];  Service: Endoscopy;  Laterality: N/A;  . EYE SURGERY    . INCONTINENCE SURGERY    . SPINAL FUSION  2008   times two    Family History  Problem Relation Age of Onset  . Schizophrenia Father   . Schizophrenia Cousin     Social History   Socioeconomic History  .  Marital status: Married    Spouse name: Not on file  . Number of children: Not on file  . Years of education: Not on file  . Highest education level: Not on file  Occupational History  . Not on file  Social Needs  . Financial resource strain: Not on file  . Food insecurity:    Worry: Not on file    Inability: Not on file  . Transportation needs:    Medical: Not on file    Non-medical: Not on file  Tobacco Use  . Smoking status: Former Smoker    Packs/day: 1.00    Types: Cigarettes    Last attempt to quit: 02/09/2016    Years since quitting: 2.8  . Smokeless tobacco: Never Used  . Tobacco comment: use vapor  Substance and Sexual Activity  . Alcohol use: No  . Drug use: No  . Sexual activity: Not on file  Lifestyle  . Physical activity:    Days per week: Not on file    Minutes per session: Not on file  . Stress: Not on file  Relationships  . Social connections:    Talks on phone: Not on file    Gets together: Not on file    Attends religious service: Not on file    Active member of club or organization: Not on file    Attends meetings of clubs or organizations: Not on file    Relationship status: Not on file  . Intimate partner violence:    Fear of current or ex partner: Not on file    Emotionally abused: Not on file    Physically abused: Not on file    Forced sexual activity: Not on file  Other Topics Concern  . Not on file  Social History Narrative  . Not on file    Outpatient Medications Prior to Visit  Medication Sig Dispense Refill  . albuterol (PROVENTIL HFA;VENTOLIN HFA) 108 (90 Base) MCG/ACT inhaler Inhale 1-2 puffs into the lungs every 6 (six) hours as needed for wheezing or shortness of breath. 1 Inhaler 0  . ALPRAZolam (XANAX) 0.5 MG tablet Take 1 tablet by mouth 3 (three) times daily as needed for anxiety.    . ARIPiprazole (ABILIFY) 5 MG tablet Take 1 tablet (5 mg total) by mouth daily. 30 tablet 2  . atorvastatin (LIPITOR) 20 MG tablet Take 1 tablet  (20 mg total) by mouth daily. 100 tablet 1  . clomiPRAMINE (ANAFRANIL) 75 MG capsule Take 75 mg by mouth at bedtime.    . lamoTRIgine (LAMICTAL) 100 MG tablet Take 1 tablet (100 mg total) by mouth daily. (Patient taking differently: Take 100 mg by mouth 2 (two) times daily. ) 30 tablet 2  . levothyroxine (SYNTHROID, LEVOTHROID) 75 MCG tablet Take 1 tablet (75 mcg total) by mouth daily. 90 tablet 3  . lisinopril (PRINIVIL,ZESTRIL) 10  MG tablet Take 1 tablet (10 mg total) by mouth daily. 100 tablet 1  . meloxicam (MOBIC) 7.5 MG tablet Take 1 tablet (7.5 mg total) by mouth 2 (two) times daily as needed for pain. 60 tablet 2  . methylphenidate (RITALIN) 10 MG tablet Take 1 tablet by mouth 2 (two) times daily.     No facility-administered medications prior to visit.     Allergies  Allergen Reactions  . Pregabalin Anaphylaxis  . Celebrex [Celecoxib] Nausea And Vomiting    Stomach pain  . Effexor [Venlafaxine Hcl] Other (See Comments)    Serotonin toxicity  . Mirapex [Pramipexole]     Mirapex- Worsening "body jerks"  . Pristiq [Desvenlafaxine] Other (See Comments)    Serotonin toxicity   . Prozac [Fluoxetine Hcl] Swelling  . Symbyax [Olanzapine-Fluoxetine Hcl] Swelling  . Wellbutrin [Bupropion Hcl] Swelling    ROS Review of Systems  Constitutional: Negative for diaphoresis, fatigue, fever and unexpected weight change.  HENT: Positive for congestion, postnasal drip, rhinorrhea, sinus pressure and sinus pain.   Eyes: Negative for photophobia and visual disturbance.  Respiratory: Positive for cough. Negative for shortness of breath and wheezing.   Cardiovascular: Negative.   Gastrointestinal: Negative for nausea and vomiting.  Genitourinary: Negative.   Musculoskeletal: Negative for arthralgias and myalgias.  Skin: Negative for pallor and rash.  Neurological: Positive for headaches. Negative for weakness.  Hematological: Negative.       Objective:    Physical Exam  Constitutional:  She is oriented to person, place, and time. No distress.  Pulmonary/Chest: Effort normal.  Neurological: She is alert and oriented to person, place, and time.  Psychiatric: She has a normal mood and affect. Her behavior is normal.    Ht 5\' 4"  (1.626 m)   BMI 46.69 kg/m  Wt Readings from Last 3 Encounters:  11/25/18 272 lb (123.4 kg)  11/14/18 272 lb (123.4 kg)  11/13/18 276 lb (125.2 kg)     Health Maintenance Due  Topic Date Due  . HIV Screening  12/19/1987  . TETANUS/TDAP  10/15/2018    There are no preventive care reminders to display for this patient.  Lab Results  Component Value Date   TSH 2.68 11/12/2018   Lab Results  Component Value Date   WBC 9.6 11/12/2018   HGB 12.5 11/12/2018   HCT 37.9 11/12/2018   MCV 83.8 11/12/2018   PLT 317.0 11/12/2018   Lab Results  Component Value Date   NA 135 11/12/2018   K 3.9 11/12/2018   CO2 28 11/12/2018   GLUCOSE 156 (H) 11/12/2018   BUN 9 11/12/2018   CREATININE 0.85 11/12/2018   BILITOT 0.4 11/12/2018   ALKPHOS 102 11/12/2018   AST 19 11/12/2018   ALT 31 11/12/2018   PROT 7.0 11/12/2018   ALBUMIN 4.1 11/12/2018   CALCIUM 9.3 11/12/2018   ANIONGAP 5 12/15/2017   GFR 72.04 11/12/2018   Lab Results  Component Value Date   CHOL 166 11/12/2018   Lab Results  Component Value Date   HDL 35.80 (L) 11/12/2018   Lab Results  Component Value Date   LDLCALC 174 (H) 04/15/2015   Lab Results  Component Value Date   TRIG 221.0 (H) 11/12/2018   Lab Results  Component Value Date   CHOLHDL 5 11/12/2018   No results found for: HGBA1C    Assessment & Plan:   Problem List Items Addressed This Visit      Respiratory   Allergic rhinitis due to pollen - Primary  Relevant Medications   cetirizine (ZYRTEC) 10 MG tablet   predniSONE (DELTASONE) 10 MG tablet   Acute frontal sinusitis   Relevant Medications   cetirizine (ZYRTEC) 10 MG tablet   amoxicillin (AMOXIL) 500 MG capsule   predniSONE (DELTASONE) 10 MG  tablet   Obstructive sleep apnea syndrome   Relevant Orders   Ambulatory referral to Pulmonology      Meds ordered this encounter  Medications  . cetirizine (ZYRTEC) 10 MG tablet    Sig: Take 1 tablet (10 mg total) by mouth daily.    Dispense:  30 tablet    Refill:  11  . amoxicillin (AMOXIL) 500 MG capsule    Sig: Take 1 capsule (500 mg total) by mouth 3 (three) times daily.    Dispense:  30 capsule    Refill:  0  . predniSONE (DELTASONE) 10 MG tablet    Sig: Take 1 tablet (10 mg total) by mouth 2 (two) times daily with a meal for 5 days.    Dispense:  10 tablet    Refill:  0    Follow-up: Return in about 1 week (around 12/17/2018), or if symptoms worsen or fail to improve.    Libby Maw, MD

## 2018-12-11 ENCOUNTER — Telehealth: Payer: Self-pay

## 2018-12-11 DIAGNOSIS — G4733 Obstructive sleep apnea (adult) (pediatric): Secondary | ICD-10-CM | POA: Insufficient documentation

## 2018-12-11 NOTE — Addendum Note (Signed)
Addended by: Jon Billings on: 12/11/2018 03:19 PM   Modules accepted: Orders

## 2018-12-11 NOTE — Telephone Encounter (Signed)
Copied from [place] [id number]. Topic: General - Other >> Dec 11, 2018  2:25 PM Wynetta Emery, [place] C wrote: Reason for CRM: pt called in to check the status of her sleep study results.   Please advise   CB: [phone]

## 2018-12-11 NOTE — Telephone Encounter (Signed)
Patient does have sleep apnea. Please follow up with Dr. Annamaria Boots for treatment.

## 2018-12-12 NOTE — Telephone Encounter (Signed)
I called and spoke with patient. We went over results below and she is aware that Pulmonology will contact her to schedule an appointment.

## 2018-12-19 DIAGNOSIS — F429 Obsessive-compulsive disorder, unspecified: Secondary | ICD-10-CM | POA: Diagnosis not present

## 2018-12-19 DIAGNOSIS — F3132 Bipolar disorder, current episode depressed, moderate: Secondary | ICD-10-CM | POA: Diagnosis not present

## 2018-12-19 DIAGNOSIS — F411 Generalized anxiety disorder: Secondary | ICD-10-CM | POA: Diagnosis not present

## 2018-12-20 ENCOUNTER — Telehealth: Payer: Self-pay

## 2018-12-20 NOTE — Telephone Encounter (Signed)
Ok to make her an appointment

## 2018-12-20 NOTE — Telephone Encounter (Signed)
Referral was placed by pt's PCP Dr. Ethelene Hal for a sleep consult with [facility].  Called and spoke with pt in regards to the referral that was placed and pt stated that she is wanting to come in for a sleep consult with CY sooner than September.  Pt had HST performed 11/14/2018 which you can see in pt's epic chart and based on results, pt was diagnosed with severe obstructive sleep apnea. Pt stated the referral was placed for pt so she could be able to be started on therapy to help with the sleep apnea.  Pt stated that she does snore at night and her husband has mentioned that she does stop breathing overnight. Pt also stated that she has gained weight and the snoring started due to that.   Pt does have SOB due to the weight gain. Pt denies any complaints of cough, chest tightness or chest pain. Pt denies any fever, no recent travelling and has not been around anyone that has been sick.  Dr. Annamaria Boots, please advise if you are okay with [place] scheduling an appt for pt with you as she is not wanting to wait until September for the consult due to wanting to be started on therapy ASAP. Thanks!

## 2018-12-20 NOTE — Telephone Encounter (Signed)
Attempted to call pt to get her scheduled for a sleep consult with CY but unable to reach her. Left message for pt to return call. When pt returns call, please schedule her a sleep consult with CY as this has been okayed by him.  covid screening was already done and pt denies any complaints of cough, chest tightness/chest pain, denies any fever, no recent travelling, and has not been around anyone that has been sick.

## 2018-12-21 ENCOUNTER — Encounter: Payer: Self-pay | Admitting: Internal Medicine

## 2018-12-21 ENCOUNTER — Telehealth: Payer: Self-pay | Admitting: Internal Medicine

## 2018-12-21 ENCOUNTER — Other Ambulatory Visit: Payer: Self-pay

## 2018-12-21 ENCOUNTER — Ambulatory Visit (INDEPENDENT_AMBULATORY_CARE_PROVIDER_SITE_OTHER): Payer: PPO | Admitting: Internal Medicine

## 2018-12-21 VITALS — BP 138/70 | HR 71 | Ht 64.0 in | Wt 281.0 lb

## 2018-12-21 DIAGNOSIS — F332 Major depressive disorder, recurrent severe without psychotic features: Secondary | ICD-10-CM | POA: Diagnosis not present

## 2018-12-21 DIAGNOSIS — G4733 Obstructive sleep apnea (adult) (pediatric): Secondary | ICD-10-CM

## 2018-12-21 NOTE — Assessment & Plan Note (Signed)
Significant weight gain in the last few years. She understands this may well contribute to her OSA as well as other problems. Consider Bariatric referral for counseling.

## 2018-12-21 NOTE — Telephone Encounter (Signed)
Pt has been scheduled Sleep Consult with CY today at 11:30. Closing encounter.

## 2018-12-21 NOTE — Assessment & Plan Note (Signed)
This is a significant concern and she continues to work with her behavioral health providers.

## 2018-12-21 NOTE — Telephone Encounter (Signed)
Yes, you may

## 2018-12-21 NOTE — Telephone Encounter (Signed)
CY, please advise if we can use one of your hold spots for a follow up for this patient. Thanks!

## 2018-12-21 NOTE — Progress Notes (Signed)
12/21/2018- 48 yoF former smoker for sleep evaluation: referred by Dr Ethelene Hal for [place], sleep study 11/2018, snoring, stops breathing at night HST 11/14/2018- AHI 50.9/ hr, desaturation to 77%, body weight 272 lbs Medical problem list includes HBP, Allergic rhinitis, hypothyroidism, major depression, anxiety, morbid obesity,  Meds include Ritalin 10 mg, Alprazolam 0.5 mg, albuterol hfa, Abilify, Lamictal Epworth score 16 She gained over 100 lbs in last 2 years, possibly related to medication. With that her husband tells her she is snoring loudly and asks her to sleep on couch. Witnessed apneas. Admits daytime sleepiness, often going back to bed during the day.  Coffee- 1 cup. Melatonin at bedtime. ENT surgery- none. Family- 1 aunt with OSA.  Prior to Admission medications   Medication Sig Start Date End Date Taking? Authorizing Provider  albuterol (PROVENTIL HFA;VENTOLIN HFA) 108 (90 Base) MCG/ACT inhaler Inhale 1-2 puffs into the lungs every 6 (six) hours as needed for wheezing or shortness of breath. 11/25/18  Yes Long, Wonda Olds, MD  ALPRAZolam Duanne Moron) 0.5 MG tablet Take 1 tablet by mouth 3 (three) times daily as needed for anxiety. 10/23/18  Yes [provider]  amoxicillin (AMOXIL) 500 MG capsule Take 1 capsule (500 mg total) by mouth 3 (three) times daily. 12/10/18  Yes Libby Maw, MD  ARIPiprazole (ABILIFY) 5 MG tablet Take 1 tablet (5 mg total) by mouth daily. 09/17/15  Yes Clarene Reamer, MD  atorvastatin (LIPITOR) 20 MG tablet Take 1 tablet (20 mg total) by mouth daily. 11/12/18  Yes Libby Maw, MD  cetirizine (ZYRTEC) 10 MG tablet Take 1 tablet (10 mg total) by mouth daily. 12/10/18  Yes Libby Maw, MD  clomiPRAMINE (ANAFRANIL) 75 MG capsule Take 75 mg by mouth at bedtime.   Yes [provider]  lamoTRIgine (LAMICTAL) 100 MG tablet Take 1 tablet (100 mg total) by mouth daily. Patient taking differently: Take 100 mg by mouth 2 (two) times daily.   09/17/15  Yes Clarene Reamer, MD  levothyroxine (SYNTHROID, LEVOTHROID) 75 MCG tablet Take 1 tablet (75 mcg total) by mouth daily. 11/13/18  Yes Libby Maw, MD  lisinopril (PRINIVIL,ZESTRIL) 10 MG tablet Take 1 tablet (10 mg total) by mouth daily. 11/12/18  Yes Libby Maw, MD  meloxicam (MOBIC) 7.5 MG tablet Take 1 tablet (7.5 mg total) by mouth 2 (two) times daily as needed for pain. 11/13/18  Yes Leandrew Koyanagi, MD  methylphenidate (RITALIN) 10 MG tablet Take 1 tablet by mouth 2 (two) times daily. 08/26/18  Yes [provider]   Past Medical History:  Diagnosis Date  . ADHD (attention deficit hyperactivity disorder)   . Anemia    with pregnancy  . Anxiety   . Arthritis   . Bipolar disorder ([place])   . Chronic back pain greater than 3 months duration   . Depression   . Dysuria    has to have self in and out cath   . Eroded bladder suspension mesh ([place])   . Hepatitis    C  . Hypothyroidism   . Leg pain, bilateral   . Rosacea   . Sciatic nerve pain   . Social anxiety disorder   . Thyroid disease    Past Surgical History:  Procedure Laterality Date  . BACK SURGERY     total of 6 back surgeries  . bladder mesh  2009  . DILITATION & CURRETTAGE/HYSTROSCOPY WITH NOVASURE ABLATION N/A 08/19/2016   Procedure: DILATATION & CURETTAGE WITH NOVASURE ABLATION;  Surgeon: [name], MD;  Location: [facility];  Service: Gynecology;  Laterality: N/A;  . EUS N/A 09/09/2015   Procedure: UPPER ENDOSCOPIC ULTRASOUND (EUS) RADIAL;  Surgeon: [name], MD;  Location: [facility];  Service: Endoscopy;  Laterality: N/A;  . EYE SURGERY    . INCONTINENCE SURGERY    . SPINAL FUSION  2008   times two   Family History  Problem Relation Age of Onset  . Schizophrenia Father   . Schizophrenia Cousin    Social History   Socioeconomic History  . Marital status: Married    Spouse name: Not on file  . Number of children: Not on file  . Years of education: Not on file   . Highest education level: Not on file  Occupational History  . Not on file  Social Needs  . Financial resource strain: Not on file  . Food insecurity:    Worry: Not on file    Inability: Not on file  . Transportation needs:    Medical: Not on file    Non-medical: Not on file  Tobacco Use  . Smoking status: Former Smoker    Packs/day: 1.00    Types: Cigarettes    Last attempt to quit: 02/09/2016    Years since quitting: 2.8  . Smokeless tobacco: Never Used  . Tobacco comment: use vapor  Substance and Sexual Activity  . Alcohol use: No  . Drug use: No  . Sexual activity: Not on file  Lifestyle  . Physical activity:    Days per week: Not on file    Minutes per session: Not on file  . Stress: Not on file  Relationships  . Social connections:    Talks on phone: Not on file    Gets together: Not on file    Attends religious service: Not on file    Active member of club or organization: Not on file    Attends meetings of clubs or organizations: Not on file    Relationship status: Not on file  . Intimate partner violence:    Fear of current or ex partner: Not on file    Emotionally abused: Not on file    Physically abused: Not on file    Forced sexual activity: Not on file  Other Topics Concern  . Not on file  Social History Narrative  . Not on file   ROS-see HPI   + = positive Constitutional:    weight loss, night sweats, fevers, chills, +fatigue, lassitude. HEENT:   + headaches, difficulty swallowing, tooth/dental problems, sore throat,       sneezing, itching, ear ache, +nasal congestion, post nasal drip, snoring CV:    chest pain, orthopnea, PND, swelling in lower extremities, anasarca,                                  dizziness, palpitations Resp:   +shortness of breath with exertion or at rest.                +productive cough,   +non-productive cough, coughing up of blood.              change in color of mucus.  wheezing.   Skin:    rash or lesions. GI:  +  heartburn, indigestion, abdominal pain, nausea, vomiting, diarrhea,                 change in bowel habits, loss  of appetite GU: dysuria, change in color of urine, no urgency or frequency.   flank pain. MS:  + joint pain, stiffness, decreased range of motion, back pain. Neuro-     nothing unusual Psych:  change in mood or affect.  +depression or +anxiety.   memory loss.  OBJ- Physical Exam General- Alert, Oriented, Affect-appropriate, Distress- none acute, + morbidly obese Skin- rash-none, lesions- none, excoriation- none Lymphadenopathy- none Head- atraumatic            Eyes- Gross vision intact, PERRLA, conjunctivae and secretions clear            Ears- Hearing, canals-normal            Nose- Clear, no-Septal dev, mucus, polyps, erosion, perforation             Throat- Mallampati III-IV , mucosa clear , drainage- none, tonsils- atrophic, Own teeth Neck- flexible , trachea midline, no stridor , thyroid nl, carotid no bruit Chest - symmetrical excursion , unlabored           Heart/CV- RRR , no murmur , no gallop  , no rub, nl s1 s2                           - JVD- none , edema- none, stasis changes- none, varices- none           Lung- clear to P&A, wheeze- none, cough- none , dullness-none, rub- none           Chest wall-  Abd-  Br/ Gen/ Rectal- Not done, not indicated Extrem- cyanosis- none, clubbing, none, atrophy- none, strength- nl Neuro- grossly intact to observation

## 2018-12-21 NOTE — Patient Instructions (Signed)
Order- new DME, new CPAP auto 5-20, mask of choice, humidifier, supplies, [place]  Please call as needed

## 2018-12-21 NOTE — Telephone Encounter (Signed)
Spoke with patient. She has been scheduled for July 21st at 1130am. She is aware of appointment and has requested that I will mail an appointment card to her. Will place it in the mail today.   Nothing further needed at time of call.

## 2018-12-21 NOTE — Assessment & Plan Note (Signed)
Severe obstructive sleep apnea complicated by HBP and obesity. Basics of sleep hygiene, medical concerns of OSA and treatment options discussed. Responsibility for driving safely. Plan- Start CPAP auto 5-20

## 2018-12-26 ENCOUNTER — Telehealth: Payer: Self-pay | Admitting: Internal Medicine

## 2018-12-26 NOTE — Telephone Encounter (Signed)
[place], [phone], phone rings a few times, then busy signal. [place], ext [phone] for [facility].  Sonia Baller stated cpap order was received.  [employer] is waiting in insurance approval for cpap.  Sonia Baller stated once that is received they will contact the Patient.   Called and spoke with Patient. Patient made aware [employer] is waiting on insurance approval.  Patient told someone from [facility] will contact her once approval is completed. Understanding stated.  Nothing further at this time.

## 2018-12-28 DIAGNOSIS — B1920 Unspecified viral hepatitis C without hepatic coma: Secondary | ICD-10-CM | POA: Diagnosis not present

## 2019-01-03 ENCOUNTER — Telehealth: Payer: Self-pay | Admitting: Internal Medicine

## 2019-01-03 NOTE — Telephone Encounter (Signed)
Message sent to [employer] will await for response

## 2019-01-03 NOTE — Telephone Encounter (Signed)
Returned call to patient and made aware we do know order was received. We have made 2 attempts without success to [place]. I will reach out to [facility] [facility] and get back with patient.

## 2019-01-03 NOTE — Telephone Encounter (Signed)
Can someone get an update on pts cpap order?

## 2019-01-04 NOTE — Telephone Encounter (Signed)
Called [employer] for cpap update. Unable to speak with [name] at her number.  Spoke with [name].  They are waiting for insurance approval and will contact Patient as soon as it is received. Called and spoke with Patient.  Patient made aware of reason for delay in her cpap.  Patient stated understanding.  Nothing further at this time.

## 2019-01-10 ENCOUNTER — Ambulatory Visit: Payer: Self-pay | Admitting: Family Medicine

## 2019-01-10 ENCOUNTER — Encounter: Payer: Self-pay | Admitting: Family Medicine

## 2019-01-10 ENCOUNTER — Ambulatory Visit (INDEPENDENT_AMBULATORY_CARE_PROVIDER_SITE_OTHER): Payer: PPO | Admitting: Family Medicine

## 2019-01-10 VITALS — BP 149/81 | Temp 98.2°F | Ht 64.0 in | Wt 275.0 lb

## 2019-01-10 DIAGNOSIS — R058 Other specified cough: Secondary | ICD-10-CM | POA: Insufficient documentation

## 2019-01-10 DIAGNOSIS — I10 Essential (primary) hypertension: Secondary | ICD-10-CM | POA: Diagnosis not present

## 2019-01-10 DIAGNOSIS — J301 Allergic rhinitis due to pollen: Secondary | ICD-10-CM | POA: Diagnosis not present

## 2019-01-10 DIAGNOSIS — R0982 Postnasal drip: Secondary | ICD-10-CM | POA: Diagnosis not present

## 2019-01-10 DIAGNOSIS — J4520 Mild intermittent asthma, uncomplicated: Secondary | ICD-10-CM

## 2019-01-10 DIAGNOSIS — R05 Cough: Secondary | ICD-10-CM | POA: Diagnosis not present

## 2019-01-10 MED ORDER — PREDNISONE 10 MG PO TABS: 10.0000 mg | ORAL_TABLET | Freq: Every day | ORAL | 0 refills | Status: AC

## 2019-01-10 MED ORDER — FLUTICASONE PROPIONATE 50 MCG/ACT NA SUSP: 2.0000 | Freq: Every day | NASAL | 2 refills | Status: DC

## 2019-01-10 MED ORDER — BENZONATATE 200 MG PO CAPS: 200.0000 mg | ORAL_CAPSULE | Freq: Two times a day (BID) | ORAL | 0 refills | Status: DC | PRN

## 2019-01-10 MED ORDER — AZELASTINE HCL 0.1 % NA SOLN: 1.0000 | Freq: Two times a day (BID) | NASAL | 2 refills | Status: DC

## 2019-01-10 NOTE — Telephone Encounter (Signed)
Pt seen via virtual visit.

## 2019-01-10 NOTE — Progress Notes (Signed)
Established Patient Office Visit  Subjective:  Patient ID: [name], female    DOB: [birth year]  Age: 48 y.o. MRN: [id number]  CC:  Chief Complaint  Patient presents with  . bad cough    HPI [name] presents for patient continues to have postnasal drip with sneezing, scratchy throat and cough.  Cough is been worse at night most recently.  Prednisone it helped in the past.  She does continue with zyrtec therapy.  Patient denies facial pressure, teeth pain, purulent rhinorrhea or phlegm.  There is been no fever or chills.  Denies wheezing at this time.  Past Medical History:  Diagnosis Date  . ADHD (attention deficit hyperactivity disorder)   . Anemia    with pregnancy  . Anxiety   . Arthritis   . Bipolar disorder ([place])   . Chronic back pain greater than 3 months duration   . Depression   . Dysuria    has to have self in and out cath   . Eroded bladder suspension mesh ([place])   . Hepatitis    C  . Hypothyroidism   . Leg pain, bilateral   . Rosacea   . Sciatic nerve pain   . Social anxiety disorder   . Thyroid disease     Past Surgical History:  Procedure Laterality Date  . BACK SURGERY     total of 6 back surgeries  . bladder mesh  2009  . DILITATION & CURRETTAGE/HYSTROSCOPY WITH NOVASURE ABLATION N/A 08/19/2016   Procedure: DILATATION & CURETTAGE WITH NOVASURE ABLATION;  Surgeon: [name], MD;  Location: [facility];  Service: Gynecology;  Laterality: N/A;  . EUS N/A 09/09/2015   Procedure: UPPER ENDOSCOPIC ULTRASOUND (EUS) RADIAL;  Surgeon: [name], MD;  Location: [facility];  Service: Endoscopy;  Laterality: N/A;  . EYE SURGERY    . INCONTINENCE SURGERY    . SPINAL FUSION  2008   times two    Family History  Problem Relation Age of Onset  . Schizophrenia Father   . Schizophrenia Cousin     Social History   Socioeconomic History  . Marital status: Married    Spouse name: Not on file  . Number of children: Not on file  . Years of  education: Not on file  . Highest education level: Not on file  Occupational History  . Not on file  Social Needs  . Financial resource strain: Not on file  . Food insecurity:    Worry: Not on file    Inability: Not on file  . Transportation needs:    Medical: Not on file    Non-medical: Not on file  Tobacco Use  . Smoking status: Former Smoker    Packs/day: 1.00    Types: Cigarettes    Last attempt to quit: 02/09/2016    Years since quitting: 2.9  . Smokeless tobacco: Never Used  . Tobacco comment: use vapor  Substance and Sexual Activity  . Alcohol use: No  . Drug use: No  . Sexual activity: Not on file  Lifestyle  . Physical activity:    Days per week: Not on file    Minutes per session: Not on file  . Stress: Not on file  Relationships  . Social connections:    Talks on phone: Not on file    Gets together: Not on file    Attends religious service: Not on file    Active member of club or organization: Not on file  Attends meetings of clubs or organizations: Not on file    Relationship status: Not on file  . Intimate partner violence:    Fear of current or ex partner: Not on file    Emotionally abused: Not on file    Physically abused: Not on file    Forced sexual activity: Not on file  Other Topics Concern  . Not on file  Social History Narrative  . Not on file    Outpatient Medications Prior to Visit  Medication Sig Dispense Refill  . albuterol (PROVENTIL HFA;VENTOLIN HFA) 108 (90 Base) MCG/ACT inhaler Inhale 1-2 puffs into the lungs every 6 (six) hours as needed for wheezing or shortness of breath. 1 Inhaler 0  . ALPRAZolam (XANAX) 0.5 MG tablet Take 1 tablet by mouth 3 (three) times daily as needed for anxiety.    . ARIPiprazole (ABILIFY) 5 MG tablet Take 1 tablet (5 mg total) by mouth daily. 30 tablet 2  . atorvastatin (LIPITOR) 20 MG tablet Take 1 tablet (20 mg total) by mouth daily. 100 tablet 1  . cetirizine (ZYRTEC) 10 MG tablet Take 1 tablet (10 mg  total) by mouth daily. 30 tablet 11  . clomiPRAMINE (ANAFRANIL) 75 MG capsule Take 75 mg by mouth at bedtime.    . lamoTRIgine (LAMICTAL) 100 MG tablet Take 1 tablet (100 mg total) by mouth daily. (Patient taking differently: Take 100 mg by mouth 2 (two) times daily. ) 30 tablet 2  . levothyroxine (SYNTHROID, LEVOTHROID) 75 MCG tablet Take 1 tablet (75 mcg total) by mouth daily. 90 tablet 3  . lisinopril (PRINIVIL,ZESTRIL) 10 MG tablet Take 1 tablet (10 mg total) by mouth daily. 100 tablet 1  . meloxicam (MOBIC) 7.5 MG tablet Take 1 tablet (7.5 mg total) by mouth 2 (two) times daily as needed for pain. 60 tablet 2  . methylphenidate (RITALIN) 10 MG tablet Take 1 tablet by mouth 2 (two) times daily.    . fluticasone (FLONASE) 50 MCG/ACT nasal spray Place 2 sprays into both nostrils daily. 16 g 2  . amoxicillin (AMOXIL) 500 MG capsule Take 1 capsule (500 mg total) by mouth 3 (three) times daily. 30 capsule 0   No facility-administered medications prior to visit.     Allergies  Allergen Reactions  . Pregabalin Anaphylaxis  . Celebrex [Celecoxib] Nausea And Vomiting    Stomach pain  . Effexor [Venlafaxine Hcl] Other (See Comments)    Serotonin toxicity  . Mirapex [Pramipexole]     Mirapex- Worsening "body jerks"  . Pristiq [Desvenlafaxine] Other (See Comments)    Serotonin toxicity   . Prozac [Fluoxetine Hcl] Swelling  . Symbyax [Olanzapine-Fluoxetine Hcl] Swelling  . Wellbutrin [Bupropion Hcl] Swelling    ROS Review of Systems  Constitutional: Negative for chills, diaphoresis, fatigue, fever and unexpected weight change.  HENT: Positive for postnasal drip, sneezing and sore throat. Negative for rhinorrhea, sinus pressure, sinus pain and voice change.   Eyes: Negative for photophobia and visual disturbance.  Respiratory: Positive for cough. Negative for shortness of breath and wheezing.   Cardiovascular: Negative.   Gastrointestinal: Negative.   Musculoskeletal: Negative for  arthralgias and myalgias.  Neurological: Negative.   Hematological: Does not bruise/bleed easily.      Objective:    Physical Exam  Constitutional: She is oriented to person, place, and time. She appears well-developed and well-nourished. No distress.  HENT:  Head: Normocephalic and atraumatic.  Right Ear: External ear normal.  Left Ear: External ear normal.  Eyes: Right eye exhibits  no discharge. Left eye exhibits no discharge. No scleral icterus.  Pulmonary/Chest: Effort normal.  Neurological: She is alert and oriented to person, place, and time.  Skin: Skin is warm and dry. She is not diaphoretic.  Psychiatric: She has a normal mood and affect. Her behavior is normal.    BP (!) 149/81   Temp 98.2 F (36.8 C)   Ht 5\' 4"  (1.626 m)   Wt 275 lb (124.7 kg)   BMI 47.20 kg/m  Wt Readings from Last 3 Encounters:  01/10/19 275 lb (124.7 kg)  12/21/18 281 lb (127.5 kg)  11/25/18 272 lb (123.4 kg)     Health Maintenance Due  Topic Date Due  . HIV Screening  12/19/1987  . TETANUS/TDAP  10/15/2018    There are no preventive care reminders to display for this patient.  Lab Results  Component Value Date   TSH 2.68 11/12/2018   Lab Results  Component Value Date   WBC 9.6 11/12/2018   HGB 12.5 11/12/2018   HCT 37.9 11/12/2018   MCV 83.8 11/12/2018   PLT 317.0 11/12/2018   Lab Results  Component Value Date   NA 135 11/12/2018   K 3.9 11/12/2018   CO2 28 11/12/2018   GLUCOSE 156 (H) 11/12/2018   BUN 9 11/12/2018   CREATININE 0.85 11/12/2018   BILITOT 0.4 11/12/2018   ALKPHOS 102 11/12/2018   AST 19 11/12/2018   ALT 31 11/12/2018   PROT 7.0 11/12/2018   ALBUMIN 4.1 11/12/2018   CALCIUM 9.3 11/12/2018   ANIONGAP 5 12/15/2017   GFR 72.04 11/12/2018   Lab Results  Component Value Date   CHOL 166 11/12/2018   Lab Results  Component Value Date   HDL 35.80 (L) 11/12/2018   Lab Results  Component Value Date   LDLCALC 174 (H) 04/15/2015   Lab Results   Component Value Date   TRIG 221.0 (H) 11/12/2018   Lab Results  Component Value Date   CHOLHDL 5 11/12/2018   No results found for: HGBA1C    Assessment & Plan:   Problem List Items Addressed This Visit      Cardiovascular and Mediastinum   Essential hypertension - Primary     Respiratory   Allergic rhinitis due to pollen   Relevant Medications   azelastine (ASTELIN) 0.1 % nasal spray   predniSONE (DELTASONE) 10 MG tablet   fluticasone (FLONASE) 50 MCG/ACT nasal spray     Other   Post-nasal drip   Relevant Medications   azelastine (ASTELIN) 0.1 % nasal spray   predniSONE (DELTASONE) 10 MG tablet   fluticasone (FLONASE) 50 MCG/ACT nasal spray   Allergic cough   Relevant Medications   azelastine (ASTELIN) 0.1 % nasal spray   predniSONE (DELTASONE) 10 MG tablet   fluticasone (FLONASE) 50 MCG/ACT nasal spray   benzonatate (TESSALON) 200 MG capsule      Meds ordered this encounter  Medications  . azelastine (ASTELIN) 0.1 % nasal spray    Sig: Place 1 spray into both nostrils 2 (two) times daily. Use in each nostril as directed    Dispense:  30 mL    Refill:  2  . predniSONE (DELTASONE) 10 MG tablet    Sig: Take 1 tablet (10 mg total) by mouth daily with breakfast for 5 days.    Dispense:  5 tablet    Refill:  0  . fluticasone (FLONASE) 50 MCG/ACT nasal spray    Sig: Place 2 sprays into both nostrils daily.  Dispense:  16 g    Refill:  2  . benzonatate (TESSALON) 200 MG capsule    Sig: Take 1 capsule (200 mg total) by mouth 2 (two) times daily as needed for cough.    Dispense:  20 capsule    Refill:  0    Follow-up: Return in about 4 weeks (around 02/07/2019), or check and record bps. Libby Maw, MDVirtual Visit via Video Note  I connected with [name] on 01/10/19 at 10:00 AM EDT by a video enabled telemedicine application and verified that I am speaking with the correct person using two identifiers.  Location: Patient:  home Provider:    I discussed the limitations of evaluation and management by telemedicine and the availability of in person appointments. The patient expressed understanding and agreed to proceed.  History of Present Illness:    Observations/Objective:   Assessment and Plan:   Follow Up Instructions:    I discussed the assessment and treatment plan with the patient. The patient was provided an opportunity to ask questions and all were answered. The patient agreed with the plan and demonstrated an understanding of the instructions.   The patient was advised to call back or seek an in-person evaluation if the symptoms worsen or if the condition fails to improve as anticipated.  I provided 15 minutes of non-face-to-face time during this encounter.   Patient will take all medicines as above.  She will start checking her blood pressure after completing prednisone therapy.  Follow-up in 1 month to see if we need to increase her enalapril.

## 2019-01-10 NOTE — Addendum Note (Signed)
Addended by: Jon Billings on: 01/10/2019 09:41 AM   Modules accepted: Orders

## 2019-01-10 NOTE — Telephone Encounter (Signed)
fyi

## 2019-01-10 NOTE — Telephone Encounter (Signed)
Pt called in c/o coughing all night so bad at times she couldn't catch her breath.   She's had this happen before and has been to the ED twice.   Been told it was from allergies.   She has post nasal drip all the time.  See triage notes.  I warm transferred the call to [facility] in Dr. Bebe Shaggy office to be scheduled.  (COVID-19 pandemic going on not seeing pts in the office).  I sent my notes to the office.   Reason for Disposition . [1] Continuous (nonstop) coughing interferes with work or school AND [2] no improvement using cough treatment per protocol  Answer Assessment - Initial Assessment Questions 1. ONSET: "When did the cough begin?"      Last night it's a dry cough.    It was so bad I couldn't catch my breath.  Now it's as though nothing happened.   My throat is sore from coughing.   I had this a few months ago and I was put on Prednisone. This morning no coughing.     *No Answer* 3. RESPIRATORY DISTRESS: "Describe your breathing."      Fine   See above  I'm having post nasal drip all the time but it's worse now.  I usually get bronchitis. 4. FEVER: "Do you have a fever?" If so, ask: "What is your temperature, how was it measured, and when did it start?"     No 5. HEMOPTYSIS: "Are you coughing up any blood?" If so ask: "How much?" (flecks, streaks, tablespoons, etc.)     No 6. TREATMENT: "What have you done so far to treat the cough?" (e.g., meds, fluids, humidifier)     No  Took Zyrtec 7. CARDIAC HISTORY: "Do you have any history of heart disease?" (e.g., heart attack, congestive heart failure)      No 8. LUNG HISTORY: "Do you have any history of lung disease?"  (e.g., pulmonary embolus, asthma, emphysema)     Just allergies when went to ED twice in the past.   9. PE RISK FACTORS: "Do you have a history of blood clots?" (or: recent major surgery, recent prolonged travel, bedridden)     No 10. OTHER SYMPTOMS: "Do you have any other symptoms? (e.g., runny nose, wheezing, chest  pain)       No chest pain 11. PREGNANCY: "Is there any chance you are pregnant?" "When was your last menstrual period?"       Had tubal 12. TRAVEL: "Have you traveled out of the country in the last month?" (e.g., travel history, exposures)       No travels  Protocols used: COUGH - ACUTE NON-PRODUCTIVE-A-AH

## 2019-02-05 ENCOUNTER — Other Ambulatory Visit: Payer: Self-pay | Admitting: Gastroenterology

## 2019-02-05 DIAGNOSIS — B1920 Unspecified viral hepatitis C without hepatic coma: Secondary | ICD-10-CM

## 2019-02-08 ENCOUNTER — Emergency Department (HOSPITAL_BASED_OUTPATIENT_CLINIC_OR_DEPARTMENT_OTHER): Payer: PPO

## 2019-02-08 ENCOUNTER — Emergency Department (HOSPITAL_BASED_OUTPATIENT_CLINIC_OR_DEPARTMENT_OTHER)
Admission: EM | Admit: 2019-02-08 | Discharge: 2019-02-08 | Disposition: A | Discharge status: Home / Self Care | Payer: PPO | Attending: Emergency Medicine | Admitting: Emergency Medicine

## 2019-02-08 ENCOUNTER — Encounter (HOSPITAL_BASED_OUTPATIENT_CLINIC_OR_DEPARTMENT_OTHER): Payer: Self-pay | Admitting: *Deleted

## 2019-02-08 ENCOUNTER — Other Ambulatory Visit: Payer: Self-pay

## 2019-02-08 DIAGNOSIS — Y9389 Activity, other specified: Secondary | ICD-10-CM | POA: Insufficient documentation

## 2019-02-08 DIAGNOSIS — I10 Essential (primary) hypertension: Secondary | ICD-10-CM | POA: Insufficient documentation

## 2019-02-08 DIAGNOSIS — Y998 Other external cause status: Secondary | ICD-10-CM | POA: Diagnosis not present

## 2019-02-08 DIAGNOSIS — Y929 Unspecified place or not applicable: Secondary | ICD-10-CM | POA: Insufficient documentation

## 2019-02-08 DIAGNOSIS — Z87891 Personal history of nicotine dependence: Secondary | ICD-10-CM | POA: Insufficient documentation

## 2019-02-08 DIAGNOSIS — Z23 Encounter for immunization: Secondary | ICD-10-CM | POA: Diagnosis not present

## 2019-02-08 DIAGNOSIS — J45909 Unspecified asthma, uncomplicated: Secondary | ICD-10-CM | POA: Diagnosis not present

## 2019-02-08 DIAGNOSIS — E039 Hypothyroidism, unspecified: Secondary | ICD-10-CM | POA: Insufficient documentation

## 2019-02-08 DIAGNOSIS — S91332A Puncture wound without foreign body, left foot, initial encounter: Secondary | ICD-10-CM | POA: Diagnosis not present

## 2019-02-08 DIAGNOSIS — S99922A Unspecified injury of left foot, initial encounter: Secondary | ICD-10-CM | POA: Diagnosis not present

## 2019-02-08 DIAGNOSIS — F909 Attention-deficit hyperactivity disorder, unspecified type: Secondary | ICD-10-CM | POA: Diagnosis not present

## 2019-02-08 DIAGNOSIS — B1920 Unspecified viral hepatitis C without hepatic coma: Secondary | ICD-10-CM | POA: Diagnosis not present

## 2019-02-08 DIAGNOSIS — W450XXA Nail entering through skin, initial encounter: Secondary | ICD-10-CM | POA: Insufficient documentation

## 2019-02-08 MED ORDER — TETANUS-DIPHTH-ACELL PERTUSSIS 5-2.5-18.5 LF-MCG/0.5 IM SUSP
0.5000 mL | Freq: Once | INTRAMUSCULAR | Status: AC
  Administered 2019-02-08: 0.5 mL via INTRAMUSCULAR
  Filled 2019-02-08: qty 0.5

## 2019-02-08 NOTE — ED Provider Notes (Signed)
Emergency Department Provider Note   I have reviewed the triage vital signs and the nursing notes.   HISTORY  Chief Complaint Foot Injury   HPI [name] is a 48 y.o. female presents to the ED with injury to the bottom or the left foot. She was working in sandals when she stepped on a nail. The sandal bottom slid to the side and the nail went in to her bare foot. It did not pass through the bottom of the sandal. Minimal bleeding. Some pain noted. No radiation of symptoms or modifying factors.    Past Medical History:  Diagnosis Date  . ADHD (attention deficit hyperactivity disorder)   . Anemia    with pregnancy  . Anxiety   . Arthritis   . Bipolar disorder ([place])   . Chronic back pain greater than 3 months duration   . Depression   . Dysuria    has to have self in and out cath   . Eroded bladder suspension mesh ([place])   . Hepatitis    C  . Hypothyroidism   . Leg pain, bilateral   . Rosacea   . Sciatic nerve pain   . Social anxiety disorder   . Thyroid disease     Patient Active Problem List   Diagnosis Date Noted  . Post-nasal drip 01/10/2019  . Allergic cough 01/10/2019  . Obstructive sleep apnea syndrome 12/11/2018  . Acute frontal sinusitis 12/10/2018  . Primary osteoarthritis of left hip 11/13/2018  . Body mass index 45.0-49.9, adult ([place]) 11/13/2018  . Morbid obesity ([place]) 11/13/2018  . Elevated cholesterol 11/12/2018  . Essential hypertension 11/12/2018  . Allergic rhinitis due to pollen 11/12/2018  . Reactive airway disease 11/12/2018  . Recurrent major depression-severe ([place]) 04/17/2015  . GAD (generalized anxiety disorder) 04/15/2015  . Bipolar depression ([place]) 04/15/2015  . Severe recurrent major depression w/psychotic features, mood-congruent ([place]) 04/14/2015  . Anxiety disorder 04/14/2015  . Major depressive disorder, recurrent severe without psychotic features ([place]) 04/14/2015  . Suicidal ideations   . Chronic LBP 01/02/2015  . Adult  hypothyroidism 06/25/2012    Past Surgical History:  Procedure Laterality Date  . BACK SURGERY     total of 6 back surgeries  . bladder mesh  2009  . DILITATION & CURRETTAGE/HYSTROSCOPY WITH NOVASURE ABLATION N/A 08/19/2016   Procedure: DILATATION & CURETTAGE WITH NOVASURE ABLATION;  Surgeon: [name], MD;  Location: [facility];  Service: Gynecology;  Laterality: N/A;  . EUS N/A 09/09/2015   Procedure: UPPER ENDOSCOPIC ULTRASOUND (EUS) RADIAL;  Surgeon: [name], MD;  Location: [facility];  Service: Endoscopy;  Laterality: N/A;  . EYE SURGERY    . INCONTINENCE SURGERY    . SPINAL FUSION  2008   times two    Allergies Pregabalin; Celebrex [celecoxib]; Effexor [venlafaxine hcl]; Mirapex [pramipexole]; Pristiq [desvenlafaxine]; Prozac [fluoxetine hcl]; Symbyax [olanzapine-fluoxetine hcl]; and Wellbutrin [bupropion hcl]  Family History  Problem Relation Age of Onset  . Schizophrenia Father   . Schizophrenia Cousin     Social History Social History   Tobacco Use  . Smoking status: Former Smoker    Packs/day: 1.00    Types: Cigarettes    Last attempt to quit: 02/09/2016    Years since quitting: 3.0  . Smokeless tobacco: Never Used  . Tobacco comment: use vapor  Substance Use Topics  . Alcohol use: No  . Drug use: No    Review of Systems  Constitutional: No fever/chills Eyes: No visual changes. ENT: No  sore throat. Cardiovascular: Denies chest pain. Respiratory: Denies shortness of breath. Gastrointestinal: No abdominal pain.  No nausea, no vomiting.  No diarrhea.  No constipation. Genitourinary: Negative for dysuria. Musculoskeletal: Negative for back pain. Skin: Wound to the bottom of the left foot.  Neurological: Negative for headaches, focal weakness or numbness.  10-point ROS otherwise negative.  ____________________________________________   PHYSICAL EXAM:  VITAL SIGNS: ED Triage Vitals  Enc Vitals Group     BP 02/08/19 1957 (!) 154/69     Pulse  Rate 02/08/19 1957 (!) 104     Resp 02/08/19 1957 18     Temp 02/08/19 1957 98.1 F (36.7 C)     Temp Source 02/08/19 1957 Oral     SpO2 02/08/19 1957 100 %     Weight 02/08/19 1958 282 lb (127.9 kg)     Height 02/08/19 1958 5\' 4"  (1.626 m)   Constitutional: Alert and oriented. Well appearing and in no acute distress. Eyes: Conjunctivae are normal. Head: Atraumatic. Nose: No congestion/rhinnorhea. Mouth/Throat: Mucous membranes are moist.  Neck: No stridor. Cardiovascular: Good peripheral circulation. Respiratory: Normal respiratory effort.  Gastrointestinal:  No distention.  Musculoskeletal: No gross deformities of extremities. Neurologic:  Normal speech and language.  Skin: Puncture wound to the bottom of the left foot near the lateral heel. No bleeding. No obvious FB.   ____________________________________________  RADIOLOGY  Dg Foot 2 Views Left  Result Date: 02/08/2019 CLINICAL DATA:  Pain status post stepping on a nail. Concern for foreign body. EXAM: LEFT FOOT - 2 VIEW COMPARISON:  None. FINDINGS: There is no evidence of fracture or dislocation. There is no evidence of arthropathy or other focal bone abnormality. Soft tissues are unremarkable. There is a probable old fracture deformity of the distal phalanx of the fifth digit. IMPRESSION: No acute displaced fracture or dislocation. No radiopaque foreign body. Electronically Signed   By: Constance Holster M.D.   On: 02/08/2019 20:42    ____________________________________________   PROCEDURES  Procedure(s) performed:   Procedures  None ____________________________________________   INITIAL IMPRESSION / ASSESSMENT AND PLAN / ED COURSE  Pertinent labs & imaging results that were available during my care of the patient were reviewed by me and considered in my medical decision making (see chart for details).   Puncture wound to the bottom of th left heel. Did not pass through the shoe. Imaging shows no bony injury or  FB. Discussed keeping the area clean and return with infection symptoms. Tetanus updated.    ____________________________________________  FINAL CLINICAL IMPRESSION(S) / ED DIAGNOSES  Final diagnoses:  Puncture wound of left foot, initial encounter     MEDICATIONS GIVEN DURING THIS VISIT:  Medications  Tdap (BOOSTRIX) injection 0.5 mL (0.5 mLs Intramuscular Given 02/08/19 2025)     Note:  This document was prepared using Dragon voice recognition software and may include unintentional dictation errors.  Nanda Quinton, MD Emergency Medicine    Venus Gilles, [facility], MD 02/11/19 [phone]

## 2019-02-08 NOTE — ED Notes (Signed)
X-ray at bedside

## 2019-02-08 NOTE — Discharge Instructions (Signed)
You were seen today with a puncture wound to the bottom of the left foot.  Your x-ray did not show any abnormal findings.  Your tetanus was updated.  Keep the area clean and assess it regularly.  If you develop any redness, drainage, fever, worsening pain this could be a sign of infection and you will need to return to start antibiotics.

## 2019-02-08 NOTE — ED Triage Notes (Signed)
Pt stepped on rusty nail and needs tdap

## 2019-02-19 ENCOUNTER — Ambulatory Visit
Admission: RE | Admit: 2019-02-19 | Discharge: 2019-02-19 | Disposition: A | Discharge status: Home / Self Care | Payer: PPO | Source: Ambulatory Visit | Attending: Gastroenterology | Admitting: Gastroenterology

## 2019-02-19 DIAGNOSIS — Z1289 Encounter for screening for malignant neoplasm of other sites: Secondary | ICD-10-CM | POA: Diagnosis not present

## 2019-02-19 DIAGNOSIS — B1920 Unspecified viral hepatitis C without hepatic coma: Secondary | ICD-10-CM

## 2019-02-26 ENCOUNTER — Telehealth: Payer: Self-pay | Admitting: Internal Medicine

## 2019-02-26 NOTE — Telephone Encounter (Signed)
Spoke with Melissa at [facility]. She wanted to let us know that they have been trying to get in contact with the patient to get her setup with her cpap machine. So far the patient has not returned their call. Since they have not reached the patient, they have sent a letter to the patient to call them.   I advised her that I would see if the patient would answer a call from our office. She stated that if we can reach the patient, to have her to call [phone] ext [phone].   Called patient and left a VM for her to call us or [employer] back.

## 2019-02-27 NOTE — Telephone Encounter (Signed)
Called and spoke with Patient.  Patient stated she was aware to call [facility]. Nothing further at this time.

## 2019-02-28 ENCOUNTER — Telehealth: Payer: Self-pay

## 2019-02-28 NOTE — Telephone Encounter (Signed)
Called and left voicemail regarding pre-screening questions for appt on 6/26  

## 2019-03-01 ENCOUNTER — Inpatient Hospital Stay: Payer: PPO

## 2019-03-01 ENCOUNTER — Inpatient Hospital Stay: Payer: PPO | Attending: Internal Medicine | Admitting: Internal Medicine

## 2019-03-01 ENCOUNTER — Encounter: Payer: Self-pay | Admitting: Internal Medicine

## 2019-03-01 ENCOUNTER — Other Ambulatory Visit: Payer: Self-pay

## 2019-03-01 ENCOUNTER — Telehealth: Payer: Self-pay | Admitting: Internal Medicine

## 2019-03-01 VITALS — BP 142/39 | HR 85 | Temp 98.5°F | Resp 18 | Ht 64.0 in | Wt 276.4 lb

## 2019-03-01 DIAGNOSIS — D72829 Elevated white blood cell count, unspecified: Secondary | ICD-10-CM | POA: Insufficient documentation

## 2019-03-01 DIAGNOSIS — D72828 Other elevated white blood cell count: Secondary | ICD-10-CM

## 2019-03-01 DIAGNOSIS — M25559 Pain in unspecified hip: Secondary | ICD-10-CM

## 2019-03-01 DIAGNOSIS — F17210 Nicotine dependence, cigarettes, uncomplicated: Secondary | ICD-10-CM | POA: Insufficient documentation

## 2019-03-01 DIAGNOSIS — I10 Essential (primary) hypertension: Secondary | ICD-10-CM | POA: Diagnosis not present

## 2019-03-01 DIAGNOSIS — E039 Hypothyroidism, unspecified: Secondary | ICD-10-CM | POA: Diagnosis not present

## 2019-03-01 DIAGNOSIS — M2550 Pain in unspecified joint: Secondary | ICD-10-CM | POA: Diagnosis not present

## 2019-03-01 DIAGNOSIS — B1920 Unspecified viral hepatitis C without hepatic coma: Secondary | ICD-10-CM | POA: Diagnosis not present

## 2019-03-01 LAB — CBC WITH DIFFERENTIAL (CANCER CENTER ONLY)
Abs Immature Granulocytes: 0.16 10*3/uL — ABNORMAL HIGH (ref 0.00–0.07)
Basophils Absolute: 0.1 10*3/uL (ref 0.0–0.1)
Basophils Relative: 1 %
Eosinophils Absolute: 0.2 10*3/uL (ref 0.0–0.5)
Eosinophils Relative: 2 %
HCT: 42.5 % (ref 36.0–46.0)
Hemoglobin: 13.5 g/dL (ref 12.0–15.0)
Immature Granulocytes: 1 %
Lymphocytes Relative: 32 %
Lymphs Abs: 3.8 10*3/uL (ref 0.7–4.0)
MCH: 26.8 pg (ref 26.0–34.0)
MCHC: 31.8 g/dL (ref 30.0–36.0)
MCV: 84.3 fL (ref 80.0–100.0)
Monocytes Absolute: 0.8 10*3/uL (ref 0.1–1.0)
Monocytes Relative: 7 %
Neutro Abs: 6.7 10*3/uL (ref 1.7–7.7)
Neutrophils Relative %: 57 %
Platelet Count: 315 10*3/uL (ref 150–400)
RBC: 5.04 MIL/uL (ref 3.87–5.11)
RDW: 16.6 % — ABNORMAL HIGH (ref 11.5–15.5)
WBC Count: 11.7 10*3/uL — ABNORMAL HIGH (ref 4.0–10.5)
nRBC: 0 % (ref 0.0–0.2)

## 2019-03-01 LAB — CMP (CANCER CENTER ONLY)
ALT: 24 U/L (ref 0–44)
AST: 16 U/L (ref 15–41)
Albumin: 3.9 g/dL (ref 3.5–5.0)
Alkaline Phosphatase: 123 U/L (ref 38–126)
Anion gap: 8 (ref 5–15)
BUN: 8 mg/dL (ref 6–20)
CO2: 28 mmol/L (ref 22–32)
Calcium: 9.6 mg/dL (ref 8.9–10.3)
Chloride: 103 mmol/L (ref 98–111)
Creatinine: 0.84 mg/dL (ref 0.44–1.00)
GFR, Est AFR Am: >60 mL/min (ref >60)
GFR, Estimated: >60 mL/min (ref >60)
Glucose, Bld: 125 mg/dL — ABNORMAL HIGH (ref 70–99)
Potassium: 4.2 mmol/L (ref 3.5–5.1)
Sodium: 139 mmol/L (ref 135–145)
Total Bilirubin: 0.3 mg/dL (ref 0.3–1.2)
Total Protein: 7.6 g/dL (ref 6.5–8.1)

## 2019-03-01 LAB — LACTATE DEHYDROGENASE: LDH: 179 U/L (ref 98–192)

## 2019-03-01 LAB — SEDIMENTATION RATE: Sed Rate: 30 mm/hr — ABNORMAL HIGH (ref 0–22)

## 2019-03-01 NOTE — Telephone Encounter (Signed)
Called and spoke with patient. Confirmed date and time of phone visit  ° °

## 2019-03-01 NOTE — Progress Notes (Signed)
Referring Physician:  Dr. [name]  Diagnosis Other elevated white blood cell (WBC) count - Plan: CBC with Differential ([place] Only), CMP ([place] only), ANA, IFA (with reflex), Rheumatoid factor, Sedimentation rate, BCR ABL1 FISH ([facility]), SPEP with reflex to IFE, Lactate dehydrogenase (LDH)  Pain in joint involving pelvic region and thigh, unspecified laterality - Plan: CBC with Differential ([place] Only), CMP ([place] only), ANA, IFA (with reflex), Rheumatoid factor, Sedimentation rate, BCR ABL1 FISH ([facility]), SPEP with reflex to IFE, Lactate dehydrogenase (LDH)  Staging Cancer Staging No matching staging information was found for the patient.  Assessment and Plan:  42.  47 year old female referred for evaluation due to leucocytosis.   Labs done 02/08/2019 showed WBC 14 HB 12.6 plts 335,000.  She has a normal differential.  Pt had labs done 11/12/2018 that showed WBC 9.6 HB 12.5 plts 317,000.  Chemistries showed K+ 3.9 Cr 0.85 and normal LFTs.  Labs done 11/29/2018 showed WBC 17.2 HB 11.5 plts 301,000.  She has normal differential.   Labs done 07/06/2018 showed WBC 11.4 HB 12.7 plts 289,000.  She has a normal differential.   Pt reports she had some recent URI symptoms.  She also continues to smoke.  She reports she has also been treated in the past with prednisone and reports she last took the medication in 01/2019.  She denies fevers, chills, night sweats and has noted no adenopathy.  Pt denies any family history of leukemias or lymphomas.  She has never had colonoscopy.  She has history of Hepatitis C and is followed by Dr. Paulita Fujita.   Pt is seen today for consultation due to leucocytosis.   I discussed with pt she has minimal elevation of WBC on recent labs done in March and June.   Labs done today 03/01/2019 reviewed and showed WBC 11.7 HB 13.5 plts 315,000.  She has a normal Differential.  Chemistries showed K+ 4.2 Cr 0.84 and normal LFTs.  LDH WNL at 179.  Awaiting  results of BCR/ABL.    I discussed with the patient findings are likely due to smoking and previous use of steroids.  Pt will have phone visit follow-up to go over results.  Pt reassured.    2.  Joint pain.  Awaiting results of SPEP, ANA, RF.  Sed rate 30.  Pt will have phone visit follow-up to go over results.    3.  Hepatitis C.  Pt is followed by Dr. Paulita Fujita.   4.  Hypertension.  BP is 142/39.  Follow-up with PCP.    5.  Anxiety and Depression.  Pt on several antidepressants.  Follow-up with PCP or psychiatry for management.    6.  Hypothyroidism.  Pt is on Synthroid.  Follow-up with PCP for monitoring.    7.  Smoking.  Cessation recommended.    8  Health maintenance.  Mammogram screening and GI evaluation as recommended by PCP.    40 minutes spent with more than 50% spent in review of records, counseling and coordination of care.    HPI:  48 year old female referred for evaluation due to leucocytosis.   Labs done 02/08/2019 showed WBC 14 HB 12.6 plts 335,000.  She has a normal differential.  Pt had labs done 11/12/2018 that showed WBC 9.6 HB 12.5 plts 317,000. Chemistries showed K+ 3.9 Cr 0.85 and normal LFTs.  Labs done 11/29/2018 showed WBC 17.2 HB 11.5 plts 301,000.  She has normal differential.   Labs done 07/06/2018 showed WBC  11.4 HB 12.7 plts 289,000.  She has a normal differential.   Pt reports she had some recent URI symptoms.  She also continues to smoke.  She reports she has also been treated in the past with prednisone and reports she last took the medication in 01/2019.  She denies fevers, chills, night sweats and has noted no adenopathy.  Pt denies any family history of leukemias or lymphomas.  She has never had colonoscopy.  She has history of Hepatitis C and is followed by Dr. Paulita Fujita.   Pt is seen today for consultation due to leucocytosis.    Problem List Patient Active Problem List   Diagnosis Date Noted  . Post-nasal drip [R09.82] 01/10/2019  . Allergic cough [R05]  01/10/2019  . Obstructive sleep apnea syndrome [G47.33] 12/11/2018  . Acute frontal sinusitis [J01.10] 12/10/2018  . Primary osteoarthritis of left hip [M16.12] 11/13/2018  . Body mass index 45.0-49.9, adult ([place]) [Z68.42] 11/13/2018  . Morbid obesity ([place]) [E66.01] 11/13/2018  . Elevated cholesterol [E78.00] 11/12/2018  . Essential hypertension [I10] 11/12/2018  . Allergic rhinitis due to pollen [J30.1] 11/12/2018  . Reactive airway disease [J45.909] 11/12/2018  . Recurrent major depression-severe ([place]) [F33.2] 04/17/2015  . GAD (generalized anxiety disorder) [F41.1] 04/15/2015  . Bipolar depression ([place]) [F31.9] 04/15/2015  . Severe recurrent major depression w/psychotic features, mood-congruent ([place]) [F33.3] 04/14/2015  . Anxiety disorder [F41.9] 04/14/2015  . Major depressive disorder, recurrent severe without psychotic features ([place]) [F33.2] 04/14/2015  . Suicidal ideations [R45.851]   . Chronic LBP [M54.5, G89.29] 01/02/2015  . Adult hypothyroidism [E03.9] 06/25/2012    Past Medical History Past Medical History:  Diagnosis Date  . ADHD (attention deficit hyperactivity disorder)   . Anemia    with pregnancy  . Anxiety   . Arthritis   . Bipolar disorder ([place])   . Chronic back pain greater than 3 months duration   . Depression   . Dysuria    has to have self in and out cath   . Eroded bladder suspension mesh ([place])   . Hepatitis    C  . Hypothyroidism   . Leg pain, bilateral   . Rosacea   . Sciatic nerve pain   . Social anxiety disorder   . Thyroid disease     Past Surgical History Past Surgical History:  Procedure Laterality Date  . BACK SURGERY     total of 6 back surgeries  . bladder mesh  2009  . DILITATION & CURRETTAGE/HYSTROSCOPY WITH NOVASURE ABLATION N/A 08/19/2016   Procedure: DILATATION & CURETTAGE WITH NOVASURE ABLATION;  Surgeon: [name], MD;  Location: [facility];  Service: Gynecology;  Laterality: N/A;  . EUS N/A 09/09/2015   Procedure: UPPER  ENDOSCOPIC ULTRASOUND (EUS) RADIAL;  Surgeon: [name], MD;  Location: [facility];  Service: Endoscopy;  Laterality: N/A;  . EYE SURGERY    . INCONTINENCE SURGERY    . SPINAL FUSION  2008   times two    Family History Family History  Problem Relation Age of Onset  . Schizophrenia Father   . Schizophrenia Cousin      Social History  reports that she quit smoking about 3 years ago. Her smoking use included cigarettes. She smoked 1.00 pack per day. She has never used smokeless tobacco. She reports that she does not drink alcohol or use drugs. Pt reports she continues to smoke occasionally.    Medications  Current Outpatient Medications:  .  ALPRAZolam (XANAX) 0.5 MG tablet, Take 1 tablet by mouth  3 (three) times daily as needed for anxiety., Disp: , Rfl:  .  ARIPiprazole (ABILIFY) 5 MG tablet, Take 1 tablet (5 mg total) by mouth daily., Disp: 30 tablet, Rfl: 2 .  atorvastatin (LIPITOR) 20 MG tablet, Take 1 tablet (20 mg total) by mouth daily., Disp: 100 tablet, Rfl: 1 .  clomiPRAMINE (ANAFRANIL) 75 MG capsule, Take 75 mg by mouth at bedtime., Disp: , Rfl:  .  lamoTRIgine (LAMICTAL) 100 MG tablet, Take 1 tablet (100 mg total) by mouth daily. (Patient taking differently: Take 100 mg by mouth 2 (two) times daily. ), Disp: 30 tablet, Rfl: 2 .  levothyroxine (SYNTHROID, LEVOTHROID) 75 MCG tablet, Take 1 tablet (75 mcg total) by mouth daily., Disp: 90 tablet, Rfl: 3 .  lisinopril (PRINIVIL,ZESTRIL) 10 MG tablet, Take 1 tablet (10 mg total) by mouth daily., Disp: 100 tablet, Rfl: 1 .  methylphenidate (RITALIN) 10 MG tablet, Take 1 tablet by mouth 2 (two) times daily., Disp: , Rfl:  .  albuterol (PROVENTIL HFA;VENTOLIN HFA) 108 (90 Base) MCG/ACT inhaler, Inhale 1-2 puffs into the lungs every 6 (six) hours as needed for wheezing or shortness of breath. (Patient not taking: Reported on 03/01/2019), Disp: 1 Inhaler, Rfl: 0 .  azelastine (ASTELIN) 0.1 % nasal spray, Place 1 spray into both  nostrils 2 (two) times daily. Use in each nostril as directed (Patient not taking: Reported on 03/01/2019), Disp: 30 mL, Rfl: 2 .  benzonatate (TESSALON) 200 MG capsule, Take 1 capsule (200 mg total) by mouth 2 (two) times daily as needed for cough. (Patient not taking: Reported on 03/01/2019), Disp: 20 capsule, Rfl: 0 .  cetirizine (ZYRTEC) 10 MG tablet, Take 1 tablet (10 mg total) by mouth daily. (Patient not taking: Reported on 03/01/2019), Disp: 30 tablet, Rfl: 11 .  fluticasone (FLONASE) 50 MCG/ACT nasal spray, Place 2 sprays into both nostrils daily. (Patient not taking: Reported on 03/01/2019), Disp: 16 g, Rfl: 2 .  meloxicam (MOBIC) 7.5 MG tablet, Take 1 tablet (7.5 mg total) by mouth 2 (two) times daily as needed for pain. (Patient not taking: Reported on 03/01/2019), Disp: 60 tablet, Rfl: 2  Allergies Pregabalin, Celebrex [celecoxib], Effexor [venlafaxine hcl], Mirapex [pramipexole], Pristiq [desvenlafaxine], Prozac [fluoxetine hcl], Symbyax [olanzapine-fluoxetine hcl], and Wellbutrin [bupropion hcl]  Review of Systems Review of Systems - Oncology ROS negative other than joint pain   Physical Exam  Vitals Wt Readings from Last 3 Encounters:  03/01/19 276 lb 6.4 oz (125.4 kg)  02/08/19 282 lb (127.9 kg)  01/10/19 275 lb (124.7 kg)   Temp Readings from Last 3 Encounters:  03/01/19 98.5 F (36.9 C) (Oral)  02/08/19 98.1 F (36.7 C) (Oral)  01/10/19 98.2 F (36.8 C)   BP Readings from Last 3 Encounters:  03/01/19 (!) 142/39  02/08/19 135/78  01/10/19 (!) 149/81   Pulse Readings from Last 3 Encounters:  03/01/19 85  02/08/19 89  12/21/18 71   Constitutional: Well-developed, well-nourished, and in no distress.   HENT: Head: Normocephalic and atraumatic.  Mouth/Throat: No oropharyngeal exudate. Mucosa moist. Eyes: Pupils are equal, round, and reactive to light. Conjunctivae are normal. No scleral icterus.  Neck: Normal range of motion. Neck supple. No JVD present.   Cardiovascular: Normal rate, regular rhythm and normal heart sounds.  Exam reveals no gallop and no friction rub.   No murmur heard. Pulmonary/Chest: Effort normal and breath sounds normal. No respiratory distress. No wheezes.No rales.  Abdominal: Soft. Bowel sounds are normal. Obese.  There is no tenderness. There  is no guarding.  Musculoskeletal: No edema or tenderness.  Lymphadenopathy: No cervical,axillary or supraclavicular adenopathy.  Neurological: Alert and oriented to person, place, and time. No cranial nerve deficit.  Skin: Skin is warm and dry. No rash noted. No erythema. No pallor.  Psychiatric: Affect and judgment normal.   Labs Appointment on 03/01/2019  Component Date Value Ref Range Status  . LDH 03/01/2019 179  98 - 192 U/L Final   Performed at [facility], [place] [address], [place], [place] [address]  . Sed Rate 03/01/2019 30* 0 - 22 mm/hr Final   Performed at [facility] [address] [place], [place] [address]  . Sodium 03/01/2019 139  135 - 145 mmol/L Final  . Potassium 03/01/2019 4.2  3.5 - 5.1 mmol/L Final  . Chloride 03/01/2019 103  98 - 111 mmol/L Final  . CO2 03/01/2019 28  22 - 32 mmol/L Final  . Glucose, Bld 03/01/2019 125* 70 - 99 mg/dL Final  . BUN 03/01/2019 8  6 - 20 mg/dL Final  . Creatinine 03/01/2019 0.84  0.44 - 1.00 mg/dL Final  . Calcium 03/01/2019 9.6  8.9 - 10.3 mg/dL Final  . Total Protein 03/01/2019 7.6  6.5 - 8.1 g/dL Final  . Albumin 03/01/2019 3.9  3.5 - 5.0 g/dL Final  . AST 03/01/2019 16  15 - 41 U/L Final  . ALT 03/01/2019 24  0 - 44 U/L Final  . Alkaline Phosphatase 03/01/2019 123  38 - 126 U/L Final  . Total Bilirubin 03/01/2019 0.3  0.3 - 1.2 mg/dL Final  . GFR, Est Non Af Am 03/01/2019 >60  >60 mL/min Final  . GFR, Est AFR Am 03/01/2019 >60  >60 mL/min Final  . Anion gap 03/01/2019 8  5 - 15 Final   Performed at [facility], [place] [address] [place],  [place] [address]  . WBC Count 03/01/2019 11.7* 4.0 - 10.5 K/uL Final  . RBC 03/01/2019 5.04  3.87 - 5.11 MIL/uL Final  . Hemoglobin 03/01/2019 13.5  12.0 - 15.0 g/dL Final  . HCT 03/01/2019 42.5  36.0 - 46.0 % Final  . MCV 03/01/2019 84.3  80.0 - 100.0 fL Final  . MCH 03/01/2019 26.8  26.0 - 34.0 pg Final  . MCHC 03/01/2019 31.8  30.0 - 36.0 g/dL Final  . RDW 03/01/2019 16.6* 11.5 - 15.5 % Final  . Platelet Count 03/01/2019 315  150 - 400 K/uL Final  . nRBC 03/01/2019 0.0  0.0 - 0.2 % Final  . Neutrophils Relative % 03/01/2019 57  % Final  . Neutro Abs 03/01/2019 6.7  1.7 - 7.7 K/uL Final  . Lymphocytes Relative 03/01/2019 32  % Final  . Lymphs Abs 03/01/2019 3.8  0.7 - 4.0 K/uL Final  . Monocytes Relative 03/01/2019 7  % Final  . Monocytes Absolute 03/01/2019 0.8  0.1 - 1.0 K/uL Final  . Eosinophils Relative 03/01/2019 2  % Final  . Eosinophils Absolute 03/01/2019 0.2  0.0 - 0.5 K/uL Final  . Basophils Relative 03/01/2019 1  % Final  . Basophils Absolute 03/01/2019 0.1  0.0 - 0.1 K/uL Final  . Immature Granulocytes 03/01/2019 1  % Final  . Abs Immature Granulocytes 03/01/2019 0.16* 0.00 - 0.07 K/uL Final   Performed at [facility], [place] [address], [place], [place] [address]     Pathology Orders Placed This Encounter  Procedures  . CBC with Differential ([place])  Standing Status:   Future    Number of Occurrences:   1    Standing Expiration Date:   02/29/2020  . CMP ([place] only)    Standing Status:   Future    Number of Occurrences:   1    Standing Expiration Date:   02/29/2020  . ANA, IFA (with reflex)    Standing Status:   Future    Number of Occurrences:   1    Standing Expiration Date:   02/29/2020  . Rheumatoid factor    Standing Status:   Future    Number of Occurrences:   1    Standing Expiration Date:   02/29/2020  . Sedimentation rate    Standing Status:   Future    Number of Occurrences:   1    Standing Expiration Date:    02/29/2020  . BCR ABL1 FISH ([facility])    Standing Status:   Future    Number of Occurrences:   1    Standing Expiration Date:   02/29/2020  . SPEP with reflex to IFE    Standing Status:   Future    Number of Occurrences:   1    Standing Expiration Date:   02/29/2020  . Lactate dehydrogenase (LDH)    Standing Status:   Future    Number of Occurrences:   1    Standing Expiration Date:   02/29/2020       Zoila Shutter MD

## 2019-03-02 LAB — RHEUMATOID FACTOR: Rheumatoid fact SerPl-aCnc: <10 IU/mL (ref 0.0–13.9)

## 2019-03-03 LAB — ANTINUCLEAR ANTIBODIES, IFA: ANA Ab, IFA: NEGATIVE

## 2019-03-04 DIAGNOSIS — G4733 Obstructive sleep apnea (adult) (pediatric): Secondary | ICD-10-CM | POA: Diagnosis not present

## 2019-03-05 LAB — PROTEIN ELECTROPHORESIS, SERUM, WITH REFLEX
A/G Ratio: 1.1 (ref 0.7–1.7)
Albumin ELP: 3.6 g/dL (ref 2.9–4.4)
Alpha-1-Globulin: 0.3 g/dL (ref 0.0–0.4)
Alpha-2-Globulin: 1.1 g/dL — ABNORMAL HIGH (ref 0.4–1.0)
Beta Globulin: 1.1 g/dL (ref 0.7–1.3)
Gamma Globulin: 0.8 g/dL (ref 0.4–1.8)
Globulin, Total: 3.3 g/dL (ref 2.2–3.9)
Total Protein ELP: 6.9 g/dL (ref 6.0–8.5)

## 2019-03-14 LAB — BCR ABL1 FISH (GENPATH)

## 2019-03-15 ENCOUNTER — Inpatient Hospital Stay: Payer: PPO | Attending: Internal Medicine | Admitting: Internal Medicine

## 2019-03-15 DIAGNOSIS — D72828 Other elevated white blood cell count: Secondary | ICD-10-CM

## 2019-03-15 NOTE — Progress Notes (Signed)
Virtual Visit via Telephone Note  I connected with [name] on 03/15/19 at  9:30 AM EDT by telephone and verified that I am speaking with the correct person using two identifiers.   I discussed the limitations, risks, security and privacy concerns of performing an evaluation and management service by telephone and the availability of in person appointments. I also discussed with the patient that there may be a patient responsible charge related to this service. The patient expressed understanding and agreed to proceed.  Interval History:  Historical data obtained from note dated 03/01/2019.  48 year old female referred for evaluation due to leucocytosis.   Labs done 02/08/2019 showed WBC 14 HB 12.6 plts 335,000.  She has a normal differential.  Pt had labs done 11/12/2018 that showed WBC 9.6 HB 12.5 plts 317,000. Chemistries showed K+ 3.9 Cr 0.85 and normal LFTs.  Labs done 11/29/2018 showed WBC 17.2 HB 11.5 plts 301,000.  She has normal differential.   Labs done 07/06/2018 showed WBC 11.4 HB 12.7 plts 289,000.  She has a normal differential.   Pt reports she had some recent URI symptoms.  She also continues to smoke.  She reports she has also been treated in the past with prednisone and reports she last took the medication in 01/2019.  She denies fevers, chills, night sweats and has noted no adenopathy.  Pt denies any family history of leukemias or lymphomas.  She has never had colonoscopy.  She has history of Hepatitis C and is followed by Dr. Paulita Fujita.    Observations/Objective: Review of labs    Assessment and Plan:1.  Leucocytosis.  48 year old female referred for evaluation due to leucocytosis.   Labs done 02/08/2019 showed WBC 14 HB 12.6 plts 335,000.  She has a normal differential.  Pt had labs done 11/12/2018 that showed WBC 9.6 HB 12.5 plts 317,000.  Chemistries showed K+ 3.9 Cr 0.85 and normal LFTs.  Labs done 11/29/2018 showed WBC 17.2 HB 11.5 plts 301,000.  She has normal differential.   Labs done  07/06/2018 showed WBC 11.4 HB 12.7 plts 289,000.  She has a normal differential.   Pt reports she had some recent URI symptoms.  She also continues to smoke.  She reports she has also been treated in the past with prednisone and reports she last took the medication in 01/2019.  She denies fevers, chills, night sweats and has noted no adenopathy.  Pt denies any family history of leukemias or lymphomas.  She has never had colonoscopy.  She has history of Hepatitis C and is followed by Dr. Paulita Fujita.    I discussed with pt she has minimal elevation of WBC on recent labs done in March and June.   Labs done 03/01/2019 reviewed and showed WBC 11.7 HB 13.5 plts 315,000.  She has a normal Differential.  Chemistries showed K+ 4.2 Cr 0.84 and normal LFTs.  LDH WNL at 179.  She has negative BCR/ABL.    I discussed with the patient findings are likely due to smoking and previous use of steroids.  Pt reassured.  Pt will follow-up with PCP and can be referred back if any change in labs.  No further work-up recommended at this time.    2.  Joint pain.  Pt has negative SPEP, ANA, RF.  Sed rate 30.  SPEP showed no monoclonal protein. The SPE pattern demonstrates elevation of regions containing acute phase proteins suggesting an acute/subacute inflammatory response.  Some conditions in which this pattern has  been observed include:  bacterial, viral or parasitic infection; mechanical, physical or  chemical trauma; and cardiac failure.  The gamma globulin region is unremarkable and evidence of monoclonal protein is not apparent.    Pt has history of HEP C which may explain SPEP findings.  She has no M spike or monoclonal protein.  Follow-up with PCP if ongoing joint symptoms.   3.  Hepatitis C.  Pt is followed by Dr. Paulita Fujita.   4.  Hypertension.  BP was142/39.  Follow-up with PCP.    5.  Anxiety and Depression.  Pt on several antidepressants.  Follow-up with PCP or psychiatry for management.    6.  Hypothyroidism.  Pt is  on Synthroid.  Follow-up with PCP for monitoring.    7.  Smoking.  Cessation recommended.    8  Health maintenance.  Mammogram screening and GI evaluation as recommended by PCP.     Follow Up Instructions: Follow-up with PCP for lab monitoring.  Pt can be referred back for evaluation if change in labs.      I discussed the assessment and treatment plan with the patient. The patient was provided an opportunity to ask questions and all were answered. The patient agreed with the plan and demonstrated an understanding of the instructions.   The patient was advised to call back or seek an in-person evaluation if the symptoms worsen or if the condition fails to improve as anticipated.  I provided 15 minutes of non-face-to-face time during this encounter.   Zoila Shutter, MD

## 2019-03-20 DIAGNOSIS — F429 Obsessive-compulsive disorder, unspecified: Secondary | ICD-10-CM | POA: Diagnosis not present

## 2019-03-20 DIAGNOSIS — F411 Generalized anxiety disorder: Secondary | ICD-10-CM | POA: Diagnosis not present

## 2019-03-20 DIAGNOSIS — F3132 Bipolar disorder, current episode depressed, moderate: Secondary | ICD-10-CM | POA: Diagnosis not present

## 2019-03-26 ENCOUNTER — Ambulatory Visit: Payer: Self-pay | Admitting: Internal Medicine

## 2019-04-02 ENCOUNTER — Telehealth: Payer: Self-pay | Admitting: Orthopaedic Surgery

## 2019-04-02 MED ORDER — TRAMADOL HCL 50 MG PO TABS: ORAL_TABLET | ORAL | 0 refills | Status: DC

## 2019-04-02 NOTE — Telephone Encounter (Signed)
Returned call to patient left message to call back concerning her medication

## 2019-04-02 NOTE — Telephone Encounter (Signed)
This patient called back in regards to previous call wanting to know where to call in Rx---she wants it called into [facility] on [address].  Please call to advise pt when called in

## 2019-04-02 NOTE — Telephone Encounter (Signed)
pls advise. Thanks.  

## 2019-04-02 NOTE — Telephone Encounter (Signed)
Needs to get to bmi of lower than 40.  Can google calculator and use height and weight. Ok to call in tramadol 50 mg 1 tid prn pain #30

## 2019-04-02 NOTE — Telephone Encounter (Signed)
Patient called advised the meloxicam is not working to help with the pain she is having. Patient asked if there is something stronger she can get. Patient asked how many lbs. does she need to loose in order to get her BMI down to 42? Patient said it hurts when she tries to work out. The number to contact patient is [phone]

## 2019-04-02 NOTE — Telephone Encounter (Signed)
Tried calling, no answer. LMVM advising per Mendel Ryder I advised patient to call back if she wanted the ultram called to pharmacy and if so, please verify pharmacy so it would not be called to incorrect one.

## 2019-04-02 NOTE — Telephone Encounter (Signed)
rx called to pharmacy Advised patient done

## 2019-04-03 DIAGNOSIS — G4733 Obstructive sleep apnea (adult) (pediatric): Secondary | ICD-10-CM | POA: Diagnosis not present

## 2019-04-06 ENCOUNTER — Other Ambulatory Visit: Payer: Self-pay | Admitting: Family Medicine

## 2019-04-08 NOTE — Telephone Encounter (Signed)
This is not on current medication list/thx dmf

## 2019-04-11 ENCOUNTER — Telehealth: Payer: Self-pay

## 2019-04-11 ENCOUNTER — Telehealth: Payer: Self-pay | Admitting: Orthopaedic Surgery

## 2019-04-11 NOTE — Telephone Encounter (Signed)
Dr. Zigmund Daniel please advise on absence of Dr. Ethelene Hal. I don't see this medication on her UTD medication list. Should I make pt appt to see kremer next week?  Copied from [place] [id number]. Topic: General - Other >> Apr 09, 2019  4:18 PM Leward Quan A wrote: Reason for CRM: Patient called to say that she have been taking gabapentin (NEURONTIN) 300 MG capsule for RLS an Sciatica and she has ran out and need a refill. Asking if Dr Ethelene Hal can please send an Rx to the Pharmacy please. Patient can be reached at Ph#  [phone]

## 2019-04-11 NOTE — Telephone Encounter (Signed)
Patient called advised the Ultram is not working. Patient said she is in a lot of pain and can barely walk. Patient asked if she can get something stronger. Patient asked she can have surgery now to repair the torn labrum. The number to contact patient is [phone]

## 2019-04-11 NOTE — Telephone Encounter (Signed)
Please advise 

## 2019-04-12 ENCOUNTER — Other Ambulatory Visit: Payer: Self-pay | Admitting: Physician Assistant

## 2019-04-12 MED ORDER — ACETAMINOPHEN-CODEINE #3 300-30 MG PO TABS: 1.0000 | ORAL_TABLET | Freq: Four times a day (QID) | ORAL | 0 refills | Status: DC | PRN

## 2019-04-12 NOTE — Telephone Encounter (Signed)
Liane Comber can you help me get pt scheduled with Dr. Ethelene Hal for next week? Thanks.

## 2019-04-12 NOTE — Telephone Encounter (Signed)
I called patient and advised. 

## 2019-04-12 NOTE — Telephone Encounter (Signed)
I called patient and advised. She would like Tylenol #3 called in.   Patient also had question as to whether Dr. Erlinda Hong would do surgery to repair her torn labrum and then do the hip replacement when she loses weight?  I advised Dr. Erlinda Hong is out of the office and this would wait for him to address when he returns. Patient expressed understanding.

## 2019-04-12 NOTE — Telephone Encounter (Signed)
Done

## 2019-04-12 NOTE — Telephone Encounter (Signed)
Yes, please schedule with Dr. Ethelene Hal next week to discuss.  Thanks!

## 2019-04-12 NOTE — Telephone Encounter (Signed)
we can call in tylenol #3, but we cannot do anything stronger

## 2019-04-12 NOTE — Telephone Encounter (Signed)
Just sent in.   Unfortunately, since she has a lot of arthritis, she is not a candidate for hip arthroscopy to debride the labrum.  It will continue to tear due to the underlying arthritis.  Best thing she can do is weight loss so that dr. Erlinda Hong can replace her hip

## 2019-04-15 ENCOUNTER — Telehealth: Payer: Self-pay | Admitting: Orthopaedic Surgery

## 2019-04-15 ENCOUNTER — Telehealth: Payer: Self-pay

## 2019-04-15 NOTE — Telephone Encounter (Signed)
Can you please get patient scheduled for hip injection with Dr Junius Roads per Dr Erlinda Hong request.

## 2019-04-15 NOTE — Telephone Encounter (Signed)
Hilts please

## 2019-04-15 NOTE — Telephone Encounter (Signed)
She said she would try hip injection. Would you like for Dr Junius Roads or Dr Ernestina Patches to do it?

## 2019-04-15 NOTE — Telephone Encounter (Signed)
Please advise. Thanks.  

## 2019-04-15 NOTE — Telephone Encounter (Signed)
Patient called advised she is taking Tylenol 3,Meloxicam and Ultram and nothing is working to help with the pain she is experiencing. Patient said she can't wait and is having trouble sleeping. The number to contact patient is [phone]

## 2019-04-15 NOTE — Telephone Encounter (Signed)
Questions for Screening COVID-19    During this illness, did/does the patient experience any of the following symptoms? Fever >100.52F []   Yes [x]   No []   Unknown Subjective fever (felt feverish) []   Yes [x]   No []   Unknown Chills []   Yes [x]   No []   Unknown Muscle aches (myalgia) []   Yes  No []   Unknown Runny nose (rhinorrhea) []   Yes  [x] [x]   No []   Unknown Sore throat []   Yes [x]   No []   Unknown Cough (new onset or worsening of chronic cough) []   Yes [x]   No []   Unknown Shortness of breath (dyspnea) []   Yes [x]   No []   Unknown Nausea or vomiting []   Yes [x]   No []   Unknown Headache []   Yes [x]   No []   Unknown Abdominal pain  []   Yes [x]   No []   Unknown Diarrhea (?3 loose/looser than normal stools/24hr period) []   Yes [x]   No []   Unknown Other, specify:

## 2019-04-15 NOTE — Telephone Encounter (Signed)
Does she want to try a hip injection?

## 2019-04-16 ENCOUNTER — Ambulatory Visit: Payer: PPO | Admitting: Family Medicine

## 2019-04-16 NOTE — Telephone Encounter (Signed)
Left message on the patient's voice mail to call us back to schedule hip injection with Dr. Junius Roads.

## 2019-04-18 ENCOUNTER — Ambulatory Visit (INDEPENDENT_AMBULATORY_CARE_PROVIDER_SITE_OTHER): Payer: PPO | Admitting: Family Medicine

## 2019-04-18 ENCOUNTER — Encounter: Payer: Self-pay | Admitting: Family Medicine

## 2019-04-18 DIAGNOSIS — M1612 Unilateral primary osteoarthritis, left hip: Secondary | ICD-10-CM | POA: Diagnosis not present

## 2019-04-18 NOTE — Progress Notes (Signed)
Office Visit Note   Patient: [name]           Date of Birth: 21-Oct-1970           MRN: [id number] Visit Date: 04/18/2019 Requested by: Libby Maw, [address],  [place] [id number] PCP: Libby Maw, MD  Subjective: Chief Complaint  Patient presents with  . Left Hip - Pain    US-guided cortisone injection per Dr. Erlinda Hong    HPI: She is here for left hip injection hip injection.  She has a history of DJD with degenerative labrum tear.  She is trying to lose weight so that she can have surgery.  She requests cortisone injection for symptom relief.  Pain is mostly in the groin area in the groin area.              ROS: No fevers or chills.  All other systems were reviewed and are negative.  Objective: Vital Signs: There were no vitals taken for this visit.  Physical Exam:  General:  Alert and oriented, in no acute distress. Pulm:  Breathing unlabored. Psy:  Normal mood, congruent affect. Skin: No rash on the skin. Left hip: She does have some pain with passive flexion and internal rotation.    Imaging: None today.  Assessment & Plan: 1.  Left hip pain with degenerative labral tear -Injection today.  If she fails to get adequate relief, consider fluoroscopically guided injection since visualization was difficult with ultrasound.     Procedures: Ultrasound-guided left hip injection: After sterile prep with Betadine, injected 8 cc 1% lidocaine without epinephrine and and 40 mg methylprednisolone into the femoral head/neck junction.  Visualization was difficult but I was able to see the landmarks.  She had good pain relief during the immediate anesthetic phase.  Follow-up as directed.    PMFS History: Patient Active Problem List   Diagnosis Date Noted  . Post-nasal drip 01/10/2019  . Allergic cough 01/10/2019  . Obstructive sleep apnea syndrome 12/11/2018  . Acute frontal sinusitis 12/10/2018  . Primary osteoarthritis of left  hip 11/13/2018  . Body mass index 45.0-49.9, adult ([place]) 11/13/2018  . Morbid obesity ([place]) 11/13/2018  . Elevated cholesterol 11/12/2018  . Essential hypertension 11/12/2018  . Allergic rhinitis due to pollen 11/12/2018  . Reactive airway disease 11/12/2018  . Recurrent major depression-severe ([place]) 04/17/2015  . GAD (generalized anxiety disorder) 04/15/2015  . Bipolar depression ([place]) 04/15/2015  . Severe recurrent major depression w/psychotic features, mood-congruent ([place]) 04/14/2015  . Anxiety disorder 04/14/2015  . Major depressive disorder, recurrent severe without psychotic features ([place]) 04/14/2015  . Suicidal ideations   . Chronic LBP 01/02/2015  . Adult hypothyroidism 06/25/2012   Past Medical History:  Diagnosis Date  . ADHD (attention deficit hyperactivity disorder)   . Anemia    with pregnancy  . Anxiety   . Arthritis   . Bipolar disorder ([place])   . Chronic back pain greater than 3 months duration   . Depression   . Dysuria    has to have self in and out cath   . Eroded bladder suspension mesh ([place])   . Hepatitis    C  . Hypothyroidism   . Leg pain, bilateral   . Rosacea   . Sciatic nerve pain   . Social anxiety disorder   . Thyroid disease     Family History  Problem Relation Age of Onset  . Schizophrenia Father   . Schizophrenia Cousin  Past Surgical History:  Procedure Laterality Date  . BACK SURGERY     total of 6 back surgeries  . bladder mesh  2009  . DILITATION & CURRETTAGE/HYSTROSCOPY WITH NOVASURE ABLATION N/A 08/19/2016   Procedure: DILATATION & CURETTAGE WITH NOVASURE ABLATION;  Surgeon: [name], MD;  Location: [facility];  Service: Gynecology;  Laterality: N/A;  . EUS N/A 09/09/2015   Procedure: UPPER ENDOSCOPIC ULTRASOUND (EUS) RADIAL;  Surgeon: [name], MD;  Location: [facility];  Service: Endoscopy;  Laterality: N/A;  . EYE SURGERY    . INCONTINENCE SURGERY    . SPINAL FUSION  2008   times two   Social History    Occupational History  . Not on file  Tobacco Use  . Smoking status: Former Smoker    Packs/day: 1.00    Types: Cigarettes    Quit date: 02/09/2016    Years since quitting: 3.1  . Smokeless tobacco: Never Used  . Tobacco comment: use vapor  Substance and Sexual Activity  . Alcohol use: No  . Drug use: No  . Sexual activity: Not on file

## 2019-04-19 ENCOUNTER — Encounter: Payer: Self-pay | Admitting: Family Medicine

## 2019-04-19 ENCOUNTER — Ambulatory Visit (INDEPENDENT_AMBULATORY_CARE_PROVIDER_SITE_OTHER): Payer: PPO | Admitting: Family Medicine

## 2019-04-19 VITALS — BP 130/76 | HR 103 | Ht 64.0 in | Wt 276.1 lb

## 2019-04-19 DIAGNOSIS — I10 Essential (primary) hypertension: Secondary | ICD-10-CM | POA: Diagnosis not present

## 2019-04-19 DIAGNOSIS — Z6841 Body Mass Index (BMI) 40.0 and over, adult: Secondary | ICD-10-CM | POA: Diagnosis not present

## 2019-04-19 DIAGNOSIS — M5430 Sciatica, unspecified side: Secondary | ICD-10-CM | POA: Insufficient documentation

## 2019-04-19 MED ORDER — LISINOPRIL 10 MG PO TABS: 10.0000 mg | ORAL_TABLET | Freq: Every day | ORAL | 1 refills | Status: DC

## 2019-04-19 MED ORDER — GABAPENTIN 300 MG PO CAPS: 300.0000 mg | ORAL_CAPSULE | Freq: Three times a day (TID) | ORAL | 1 refills | Status: DC | PRN

## 2019-04-19 NOTE — Progress Notes (Signed)
Established Patient Office Visit  Subjective:  Patient ID: [name], female    DOB: Mar 21, 1971  Age: 48 y.o. MRN: [id number]  CC:  Chief Complaint  Patient presents with  . Follow-up    HPI [name] presents for follow-up of her hypertension that is been well controlled with lisinopril 10 mg daily.  Denies any issues taking this medicine.  Continues with 20 of Lipitor for elevated cholesterol and triglycerides.  She has been taking her levothyroxine daily.  Status post left hip injection yesterday.  It is sore today.  She is in need of a left hip replacement but has been advised to lose weight.  History of intermittent sciatica status post 6 lower back surgeries.  She has taken Neurontin for this in the past with relief.  Has been advised to lose weight prior to hip surgery.  We discussed that this would also help lower her cholesterol and blood pressure.  She is open to seeing medical weight loss management.  Past Medical History:  Diagnosis Date  . ADHD (attention deficit hyperactivity disorder)   . Anemia    with pregnancy  . Anxiety   . Arthritis   . Bipolar disorder ([place])   . Chronic back pain greater than 3 months duration   . Depression   . Dysuria    has to have self in and out cath   . Eroded bladder suspension mesh ([place])   . Hepatitis    C  . Hypothyroidism   . Leg pain, bilateral   . Rosacea   . Sciatic nerve pain   . Social anxiety disorder   . Thyroid disease     Past Surgical History:  Procedure Laterality Date  . BACK SURGERY     total of 6 back surgeries  . bladder mesh  2009  . DILITATION & CURRETTAGE/HYSTROSCOPY WITH NOVASURE ABLATION N/A 08/19/2016   Procedure: DILATATION & CURETTAGE WITH NOVASURE ABLATION;  Surgeon: [name], MD;  Location: [facility];  Service: Gynecology;  Laterality: N/A;  . EUS N/A 09/09/2015   Procedure: UPPER ENDOSCOPIC ULTRASOUND (EUS) RADIAL;  Surgeon: [name], MD;  Location: [facility];  Service:  Endoscopy;  Laterality: N/A;  . EYE SURGERY    . INCONTINENCE SURGERY    . SPINAL FUSION  2008   times two    Family History  Problem Relation Age of Onset  . Schizophrenia Father   . Schizophrenia Cousin     Social History   Socioeconomic History  . Marital status: Married    Spouse name: Not on file  . Number of children: Not on file  . Years of education: Not on file  . Highest education level: Not on file  Occupational History  . Not on file  Social Needs  . Financial resource strain: Not on file  . Food insecurity    Worry: Not on file    Inability: Not on file  . Transportation needs    Medical: Not on file    Non-medical: Not on file  Tobacco Use  . Smoking status: Former Smoker    Packs/day: 1.00    Types: Cigarettes    Quit date: 02/09/2016    Years since quitting: 3.1  . Smokeless tobacco: Never Used  . Tobacco comment: use vapor  Substance and Sexual Activity  . Alcohol use: No  . Drug use: No  . Sexual activity: Not on file  Lifestyle  . Physical activity    Days per week:  Not on file    Minutes per session: Not on file  . Stress: Not on file  Relationships  . Social Herbalist on phone: Not on file    Gets together: Not on file    Attends religious service: Not on file    Active member of club or organization: Not on file    Attends meetings of clubs or organizations: Not on file    Relationship status: Not on file  . Intimate partner violence    Fear of current or ex partner: Not on file    Emotionally abused: Not on file    Physically abused: Not on file    Forced sexual activity: Not on file  Other Topics Concern  . Not on file  Social History Narrative  . Not on file    Outpatient Medications Prior to Visit  Medication Sig Dispense Refill  . acetaminophen-codeine (TYLENOL #3) 300-30 MG tablet Take 1 tablet by mouth every 6 (six) hours as needed for moderate pain. 30 tablet 0  . ALPRAZolam (XANAX) 0.5 MG tablet Take 1  tablet by mouth 3 (three) times daily as needed for anxiety.    . ARIPiprazole (ABILIFY) 5 MG tablet Take 1 tablet (5 mg total) by mouth daily. 30 tablet 2  . atorvastatin (LIPITOR) 20 MG tablet Take 1 tablet (20 mg total) by mouth daily. 100 tablet 1  . clomiPRAMINE (ANAFRANIL) 75 MG capsule Take 75 mg by mouth at bedtime.    . lamoTRIgine (LAMICTAL) 100 MG tablet Take 1 tablet (100 mg total) by mouth daily. (Patient taking differently: Take 100 mg by mouth 2 (two) times daily. ) 30 tablet 2  . levothyroxine (SYNTHROID, LEVOTHROID) 75 MCG tablet Take 1 tablet (75 mcg total) by mouth daily. 90 tablet 3  . methylphenidate (RITALIN) 10 MG tablet Take 1 tablet by mouth 2 (two) times daily.    Marland Kitchen lisinopril (PRINIVIL,ZESTRIL) 10 MG tablet Take 1 tablet (10 mg total) by mouth daily. 100 tablet 1   No facility-administered medications prior to visit.     Allergies  Allergen Reactions  . Pregabalin Anaphylaxis  . Celebrex [Celecoxib] Nausea And Vomiting    Stomach pain  . Effexor [Venlafaxine Hcl] Other (See Comments)    Serotonin toxicity  . Mirapex [Pramipexole]     Mirapex- Worsening "body jerks"  . Pristiq [Desvenlafaxine] Other (See Comments)    Serotonin toxicity   . Prozac [Fluoxetine Hcl] Swelling  . Symbyax [Olanzapine-Fluoxetine Hcl] Swelling  . Wellbutrin [Bupropion Hcl] Swelling    ROS Review of Systems  Constitutional: Negative.   Respiratory: Negative.   Cardiovascular: Negative.   Gastrointestinal: Negative.   Genitourinary: Negative.   Musculoskeletal: Positive for arthralgias, back pain and gait problem.  Neurological: Negative for weakness.      Objective:    Physical Exam  Constitutional: She is oriented to person, place, and time. She appears well-developed and well-nourished. No distress.  HENT:  Head: Normocephalic and atraumatic.  Right Ear: External ear normal.  Left Ear: External ear normal.  Mouth/Throat: No oropharyngeal exudate.  Eyes: Pupils are  equal, round, and reactive to light. Conjunctivae are normal. Right eye exhibits no discharge. Left eye exhibits no discharge. No scleral icterus.  Neck: Neck supple. No JVD present. No tracheal deviation present. No thyromegaly present.  Cardiovascular: Normal rate, regular rhythm and normal heart sounds.  Pulmonary/Chest: Effort normal and breath sounds normal. No stridor.  Lymphadenopathy:    She has no cervical adenopathy.  Neurological: She is alert and oriented to person, place, and time.  Skin: Skin is warm and dry. She is not diaphoretic.  Psychiatric: She has a normal mood and affect. Her behavior is normal.    BP 130/76   Pulse (!) 103   Ht 5\' 4"  (1.626 m)   Wt 276 lb 2 oz (125.2 kg)   SpO2 98%   BMI 47.40 kg/m  Wt Readings from Last 3 Encounters:  04/19/19 276 lb 2 oz (125.2 kg)  03/01/19 276 lb 6.4 oz (125.4 kg)  02/08/19 282 lb (127.9 kg)   BP Readings from Last 3 Encounters:  04/19/19 130/76  03/01/19 (!) 142/39  02/08/19 135/78   Guideline developer:  UpToDate (see UpToDate for funding source) Date Released: June 2014  Health Maintenance Due  Topic Date Due  . HIV Screening  12/19/1987  . INFLUENZA VACCINE  04/06/2019    There are no preventive care reminders to display for this patient.  Lab Results  Component Value Date   TSH 2.68 11/12/2018   Lab Results  Component Value Date   WBC 11.7 (H) 03/01/2019   HGB 13.5 03/01/2019   HCT 42.5 03/01/2019   MCV 84.3 03/01/2019   PLT 315 03/01/2019   Lab Results  Component Value Date   NA 139 03/01/2019   K 4.2 03/01/2019   CO2 28 03/01/2019   GLUCOSE 125 (H) 03/01/2019   BUN 8 03/01/2019   CREATININE 0.84 03/01/2019   BILITOT 0.3 03/01/2019   ALKPHOS 123 03/01/2019   AST 16 03/01/2019   ALT 24 03/01/2019   PROT 7.6 03/01/2019   ALBUMIN 3.9 03/01/2019   CALCIUM 9.6 03/01/2019   ANIONGAP 8 03/01/2019   GFR 72.04 11/12/2018   Lab Results  Component Value Date   CHOL 166 11/12/2018   Lab  Results  Component Value Date   HDL 35.80 (L) 11/12/2018   Lab Results  Component Value Date   LDLCALC 174 (H) 04/15/2015   Lab Results  Component Value Date   TRIG 221.0 (H) 11/12/2018   Lab Results  Component Value Date   CHOLHDL 5 11/12/2018   No results found for: HGBA1C    Assessment & Plan:   Problem List Items Addressed This Visit      Cardiovascular and Mediastinum   Essential hypertension - Primary   Relevant Medications   lisinopril (ZESTRIL) 10 MG tablet     Nervous and Auditory   Sciatica   Relevant Medications   gabapentin (NEURONTIN) 300 MG capsule     Other   Class 3 severe obesity due to excess calories with serious comorbidity and body mass index (BMI) of 45.0 to 49.9 in adult [facility])   Relevant Orders   Amb Ref to Medical Weight Management      Meds ordered this encounter  Medications  . gabapentin (NEURONTIN) 300 MG capsule    Sig: Take 1 capsule (300 mg total) by mouth 3 (three) times daily as needed.    Dispense:  90 capsule    Refill:  1  . lisinopril (ZESTRIL) 10 MG tablet    Sig: Take 1 tablet (10 mg total) by mouth daily.    Dispense:  100 tablet    Refill:  1    Follow-up: No follow-ups on file.    Continue Zestril.  Advised her to take the Synthroid in the morning on a fasting stomach an hour before eating.  Continue Lipitor.  Referral for weight loss management.  Prescription for Neurontin  to help with her sciatica.

## 2019-04-30 ENCOUNTER — Ambulatory Visit (INDEPENDENT_AMBULATORY_CARE_PROVIDER_SITE_OTHER): Payer: PPO | Admitting: Family Medicine

## 2019-04-30 ENCOUNTER — Encounter: Payer: Self-pay | Admitting: Family Medicine

## 2019-04-30 ENCOUNTER — Other Ambulatory Visit: Payer: Self-pay

## 2019-04-30 VITALS — BP 136/80 | HR 118 | Ht 64.0 in

## 2019-04-30 DIAGNOSIS — H9202 Otalgia, left ear: Secondary | ICD-10-CM | POA: Diagnosis not present

## 2019-04-30 DIAGNOSIS — H7192 Unspecified cholesteatoma, left ear: Secondary | ICD-10-CM | POA: Diagnosis not present

## 2019-04-30 DIAGNOSIS — H6982 Other specified disorders of Eustachian tube, left ear: Secondary | ICD-10-CM | POA: Diagnosis not present

## 2019-04-30 DIAGNOSIS — M25552 Pain in left hip: Secondary | ICD-10-CM | POA: Diagnosis not present

## 2019-04-30 DIAGNOSIS — H6092 Unspecified otitis externa, left ear: Secondary | ICD-10-CM | POA: Insufficient documentation

## 2019-04-30 MED ORDER — PREDNISONE 10 MG PO TABS: 10.0000 mg | ORAL_TABLET | Freq: Two times a day (BID) | ORAL | 0 refills | Status: AC

## 2019-04-30 MED ORDER — NEOMYCIN-POLYMYXIN-HC 3.5-10000-1 OT SUSP: 4.0000 [drp] | Freq: Three times a day (TID) | OTIC | 0 refills | Status: AC

## 2019-04-30 NOTE — Progress Notes (Signed)
Established Patient Office Visit  Subjective:  Patient ID: [name], female    DOB: 02-20-1971  Age: 48 y.o. MRN: [id number]  CC:  Chief Complaint  Patient presents with  . Follow-up    HPI [name] presents for left ear pain.  This is been intermittent over the last few weeks.  Denies injury, postnasal drip sneezing or cough.  Ear does feel congested at times.  Patient denies a history of bruxism.  She also has ongoing left hip pain.  She tells me that she has a labral tear.  Recent MRI of her hip on the left was normal.  She is having no back pain.  She is scheduled for a hip injection.  Past Medical History:  Diagnosis Date  . ADHD (attention deficit hyperactivity disorder)   . Anemia    with pregnancy  . Anxiety   . Arthritis   . Bipolar disorder ([place])   . Chronic back pain greater than 3 months duration   . Depression   . Dysuria    has to have self in and out cath   . Eroded bladder suspension mesh ([place])   . Hepatitis    C  . Hypothyroidism   . Leg pain, bilateral   . Rosacea   . Sciatic nerve pain   . Social anxiety disorder   . Thyroid disease     Past Surgical History:  Procedure Laterality Date  . BACK SURGERY     total of 6 back surgeries  . bladder mesh  2009  . DILITATION & CURRETTAGE/HYSTROSCOPY WITH NOVASURE ABLATION N/A 08/19/2016   Procedure: DILATATION & CURETTAGE WITH NOVASURE ABLATION;  Surgeon: [name], MD;  Location: [facility];  Service: Gynecology;  Laterality: N/A;  . EUS N/A 09/09/2015   Procedure: UPPER ENDOSCOPIC ULTRASOUND (EUS) RADIAL;  Surgeon: [name], MD;  Location: [facility];  Service: Endoscopy;  Laterality: N/A;  . EYE SURGERY    . INCONTINENCE SURGERY    . SPINAL FUSION  2008   times two    Family History  Problem Relation Age of Onset  . Schizophrenia Father   . Schizophrenia Cousin     Social History   Socioeconomic History  . Marital status: Married    Spouse name: Not on file  . Number of  children: Not on file  . Years of education: Not on file  . Highest education level: Not on file  Occupational History  . Not on file  Social Needs  . Financial resource strain: Not on file  . Food insecurity    Worry: Not on file    Inability: Not on file  . Transportation needs    Medical: Not on file    Non-medical: Not on file  Tobacco Use  . Smoking status: Former Smoker    Packs/day: 1.00    Types: Cigarettes    Quit date: 02/09/2016    Years since quitting: 3.2  . Smokeless tobacco: Never Used  . Tobacco comment: use vapor  Substance and Sexual Activity  . Alcohol use: No  . Drug use: No  . Sexual activity: Not on file  Lifestyle  . Physical activity    Days per week: Not on file    Minutes per session: Not on file  . Stress: Not on file  Relationships  . Social Herbalist on phone: Not on file    Gets together: Not on file    Attends religious service: Not  on file    Active member of club or organization: Not on file    Attends meetings of clubs or organizations: Not on file    Relationship status: Not on file  . Intimate partner violence    Fear of current or ex partner: Not on file    Emotionally abused: Not on file    Physically abused: Not on file    Forced sexual activity: Not on file  Other Topics Concern  . Not on file  Social History Narrative  . Not on file    Outpatient Medications Prior to Visit  Medication Sig Dispense Refill  . acetaminophen-codeine (TYLENOL #3) 300-30 MG tablet Take 1 tablet by mouth every 6 (six) hours as needed for moderate pain. 30 tablet 0  . ALPRAZolam (XANAX) 0.5 MG tablet Take 1 tablet by mouth 3 (three) times daily as needed for anxiety.    . ARIPiprazole (ABILIFY) 5 MG tablet Take 1 tablet (5 mg total) by mouth daily. 30 tablet 2  . atorvastatin (LIPITOR) 20 MG tablet Take 1 tablet (20 mg total) by mouth daily. 100 tablet 1  . clomiPRAMINE (ANAFRANIL) 75 MG capsule Take 75 mg by mouth at bedtime.    .  gabapentin (NEURONTIN) 300 MG capsule Take 1 capsule (300 mg total) by mouth 3 (three) times daily as needed. 90 capsule 1  . lamoTRIgine (LAMICTAL) 100 MG tablet Take 1 tablet (100 mg total) by mouth daily. (Patient taking differently: Take 100 mg by mouth 2 (two) times daily. ) 30 tablet 2  . levothyroxine (SYNTHROID, LEVOTHROID) 75 MCG tablet Take 1 tablet (75 mcg total) by mouth daily. 90 tablet 3  . lisinopril (ZESTRIL) 10 MG tablet Take 1 tablet (10 mg total) by mouth daily. 100 tablet 1  . methylphenidate (RITALIN) 10 MG tablet Take 1 tablet by mouth 2 (two) times daily.     No facility-administered medications prior to visit.     Allergies  Allergen Reactions  . Pregabalin Anaphylaxis  . Celebrex [Celecoxib] Nausea And Vomiting    Stomach pain  . Effexor [Venlafaxine Hcl] Other (See Comments)    Serotonin toxicity  . Mirapex [Pramipexole]     Mirapex- Worsening "body jerks"  . Pristiq [Desvenlafaxine] Other (See Comments)    Serotonin toxicity   . Prozac [Fluoxetine Hcl] Swelling  . Symbyax [Olanzapine-Fluoxetine Hcl] Swelling  . Wellbutrin [Bupropion Hcl] Swelling    ROS Review of Systems  Constitutional: Negative for chills, diaphoresis, fatigue, fever and unexpected weight change.  HENT: Positive for ear pain and hearing loss. Negative for congestion, ear discharge, postnasal drip, sinus pressure and sinus pain.   Eyes: Negative for photophobia and visual disturbance.  Respiratory: Negative for cough and wheezing.   Cardiovascular: Negative.   Gastrointestinal: Negative.   Musculoskeletal: Positive for arthralgias. Negative for back pain.  Skin: Negative for pallor and rash.  Allergic/Immunologic: Negative for immunocompromised state.  Neurological: Negative for light-headedness and headaches.  Hematological: Does not bruise/bleed easily.      Objective:    Physical Exam  Constitutional: She is oriented to person, place, and time. She appears well-developed and  well-nourished. No distress.  HENT:  Head: Normocephalic and atraumatic.  Right Ear: Tympanic membrane, external ear and ear canal normal.  Left Ear: No foreign bodies. Tympanic membrane is scarred and retracted. Tympanic membrane is not injected.  No middle ear effusion.  Ears:  Eyes: Conjunctivae are normal. Right eye exhibits no discharge. Left eye exhibits no discharge. No scleral icterus.  Neck: No JVD present. No tracheal deviation present.  Pulmonary/Chest: Effort normal. No stridor.  Neurological: She is alert and oriented to person, place, and time.  Skin: Skin is warm and dry. She is not diaphoretic.  Psychiatric: She has a normal mood and affect. Her behavior is normal.    BP 136/80   Pulse (!) 118   Ht 5\' 4"  (1.626 m)   SpO2 97%   BMI 47.40 kg/m  Wt Readings from Last 3 Encounters:  04/19/19 276 lb 2 oz (125.2 kg)  03/01/19 276 lb 6.4 oz (125.4 kg)  02/08/19 282 lb (127.9 kg)   BP Readings from Last 3 Encounters:  04/30/19 136/80  04/19/19 130/76  03/01/19 (!) 142/39   Guideline developer:  UpToDate (see UpToDate for funding source) Date Released: June 2014  Health Maintenance Due  Topic Date Due  . HIV Screening  12/19/1987  . INFLUENZA VACCINE  04/06/2019    There are no preventive care reminders to display for this patient.  Lab Results  Component Value Date   TSH 2.68 11/12/2018   Lab Results  Component Value Date   WBC 11.7 (H) 03/01/2019   HGB 13.5 03/01/2019   HCT 42.5 03/01/2019   MCV 84.3 03/01/2019   PLT 315 03/01/2019   Lab Results  Component Value Date   NA 139 03/01/2019   K 4.2 03/01/2019   CO2 28 03/01/2019   GLUCOSE 125 (H) 03/01/2019   BUN 8 03/01/2019   CREATININE 0.84 03/01/2019   BILITOT 0.3 03/01/2019   ALKPHOS 123 03/01/2019   AST 16 03/01/2019   ALT 24 03/01/2019   PROT 7.6 03/01/2019   ALBUMIN 3.9 03/01/2019   CALCIUM 9.6 03/01/2019   ANIONGAP 8 03/01/2019   GFR 72.04 11/12/2018   Lab Results  Component Value  Date   CHOL 166 11/12/2018   Lab Results  Component Value Date   HDL 35.80 (L) 11/12/2018   Lab Results  Component Value Date   LDLCALC 174 (H) 04/15/2015   Lab Results  Component Value Date   TRIG 221.0 (H) 11/12/2018   Lab Results  Component Value Date   CHOLHDL 5 11/12/2018   No results found for: HGBA1C    Assessment & Plan:   Problem List Items Addressed This Visit      Nervous and Auditory   Otitis externa of left ear   Relevant Medications   neomycin-polymyxin-hydrocortisone (CORTISPORIN) 3.5-10000-1 OTIC suspension   Cholesteatoma of left ear   Dysfunction of left eustachian tube   Relevant Medications   predniSONE (DELTASONE) 10 MG tablet     Other   Left hip pain   Left ear pain - Primary      Meds ordered this encounter  Medications  . predniSONE (DELTASONE) 10 MG tablet    Sig: Take 1 tablet (10 mg total) by mouth 2 (two) times daily with a meal for 5 days.    Dispense:  10 tablet    Refill:  0  . neomycin-polymyxin-hydrocortisone (CORTISPORIN) 3.5-10000-1 OTIC suspension    Sig: Place 4 drops into the left ear 3 (three) times daily for 5 days.    Dispense:  10 mL    Refill:  0    Follow-up: No follow-ups on file.   Patient will let me know if the ear pain does not improve with the prednisone and Cortisporin drops.  Will consider ENT referral. Reviewed x-rays of back and left hip.  Left hip joint space is well-maintained.   She  is scheduled for injection of her hip.

## 2019-05-02 ENCOUNTER — Telehealth: Payer: Self-pay | Admitting: Family Medicine

## 2019-05-02 DIAGNOSIS — M1612 Unilateral primary osteoarthritis, left hip: Secondary | ICD-10-CM

## 2019-05-02 NOTE — Telephone Encounter (Signed)
Patient left a message requesting another injection.  She did not specify if it was in the same hip.  CB#[phone].  Thank you.

## 2019-05-03 NOTE — Telephone Encounter (Signed)
Referral made to FN.  I could try with the new probe if he can't see her reasonably soon.

## 2019-05-03 NOTE — Telephone Encounter (Signed)
Please advise. Your last note mentions her needing a fluoro-guided injection. How soon could she get this?

## 2019-05-06 ENCOUNTER — Encounter: Payer: Self-pay | Admitting: Family Medicine

## 2019-05-06 ENCOUNTER — Ambulatory Visit (INDEPENDENT_AMBULATORY_CARE_PROVIDER_SITE_OTHER): Payer: PPO | Admitting: Family Medicine

## 2019-05-06 DIAGNOSIS — M25552 Pain in left hip: Secondary | ICD-10-CM | POA: Diagnosis not present

## 2019-05-06 NOTE — Progress Notes (Signed)
Subjective: She is here for repeat left hip injection.  First injection did not help.  I was unable to fully visualize her hip but now that we have our knee ultrasound machine, she is here for repeat injection.  Objective: Still has left groin pain with hip flexion and internal rotation.  Procedure: Ultrasound-guided left hip injection -after sterile prep with Betadine, injected 8 cc 1% lidocaine without epinephrine and 40 mg methylprednisolone into the femoral head/neck junction.  Injectate was seen filling the joint capsule.  She had modest immediate relief.

## 2019-05-15 ENCOUNTER — Other Ambulatory Visit: Payer: Self-pay

## 2019-05-15 ENCOUNTER — Ambulatory Visit (INDEPENDENT_AMBULATORY_CARE_PROVIDER_SITE_OTHER): Payer: PPO | Admitting: Family Medicine

## 2019-05-15 ENCOUNTER — Encounter: Payer: Self-pay | Admitting: Family Medicine

## 2019-05-15 VITALS — Ht 64.0 in

## 2019-05-15 DIAGNOSIS — E039 Hypothyroidism, unspecified: Secondary | ICD-10-CM

## 2019-05-15 DIAGNOSIS — Z6841 Body Mass Index (BMI) 40.0 and over, adult: Secondary | ICD-10-CM | POA: Diagnosis not present

## 2019-05-15 DIAGNOSIS — I10 Essential (primary) hypertension: Secondary | ICD-10-CM | POA: Diagnosis not present

## 2019-05-15 DIAGNOSIS — G4733 Obstructive sleep apnea (adult) (pediatric): Secondary | ICD-10-CM

## 2019-05-15 DIAGNOSIS — E7800 Pure hypercholesterolemia, unspecified: Secondary | ICD-10-CM | POA: Diagnosis not present

## 2019-05-15 NOTE — Progress Notes (Signed)
Established Patient Office Visit  Subjective:  Patient ID: [name], female    DOB: Nov 23, 1970  Age: 48 y.o. MRN: [id number]  CC:  Chief Complaint  Patient presents with  . Follow-up    HPI [name] presents for routine follow-up of her hypertension, elevated cholesterol and hypothyroidism.  Patient is having no issues taking any of her medicines.  Blood pressure has been running in the 130/80 range.  She is taking her levothyroxine in the morning time on a fasting stomach.  She is having no issues taking her Lipitor.  Left hip is much better after recent injection.  She feels as though she will be able to start exercise by walking again.  She cannot afford weight loss management.  She is having developed difficulty with her CPAP due to mask fitting.  She does have scheduled follow-up with her sleep doctor.  She has had an ulceration on the left side of her tongue over the last 6 months.  It is painless.  She has never dipped or chew tobacco.  She has smoked in the past.  Her dentist has referred her to an oral Psychologist, sport and exercise.  Last Pap smear was 2 years ago and was normal.  She is status post ablation since that time.  She reports 1 day of menstrual flow on a monthly basis.  She denies hot flashes.  Her mom went through menopause at age 37 and her sister at age 59.  She has follow-up planned with her GYN provider.  Past Medical History:  Diagnosis Date  . ADHD (attention deficit hyperactivity disorder)   . Anemia    with pregnancy  . Anxiety   . Arthritis   . Bipolar disorder ([place])   . Chronic back pain greater than 3 months duration   . Depression   . Dysuria    has to have self in and out cath   . Eroded bladder suspension mesh ([place])   . Hepatitis    C  . Hypothyroidism   . Leg pain, bilateral   . Rosacea   . Sciatic nerve pain   . Social anxiety disorder   . Thyroid disease     Past Surgical History:  Procedure Laterality Date  . BACK SURGERY     total of 6 back  surgeries  . bladder mesh  2009  . DILITATION & CURRETTAGE/HYSTROSCOPY WITH NOVASURE ABLATION N/A 08/19/2016   Procedure: DILATATION & CURETTAGE WITH NOVASURE ABLATION;  Surgeon: [name], MD;  Location: [facility];  Service: Gynecology;  Laterality: N/A;  . EUS N/A 09/09/2015   Procedure: UPPER ENDOSCOPIC ULTRASOUND (EUS) RADIAL;  Surgeon: [name], MD;  Location: [facility];  Service: Endoscopy;  Laterality: N/A;  . EYE SURGERY    . INCONTINENCE SURGERY    . SPINAL FUSION  2008   times two    Family History  Problem Relation Age of Onset  . Schizophrenia Father   . Schizophrenia Cousin     Social History   Socioeconomic History  . Marital status: Married    Spouse name: Not on file  . Number of children: Not on file  . Years of education: Not on file  . Highest education level: Not on file  Occupational History  . Not on file  Social Needs  . Financial resource strain: Not on file  . Food insecurity    Worry: Not on file    Inability: Not on file  . Transportation needs    Medical:  Not on file    Non-medical: Not on file  Tobacco Use  . Smoking status: Former Smoker    Packs/day: 1.00    Types: Cigarettes    Quit date: 02/09/2016    Years since quitting: 3.2  . Smokeless tobacco: Never Used  . Tobacco comment: use vapor  Substance and Sexual Activity  . Alcohol use: No  . Drug use: No  . Sexual activity: Not on file  Lifestyle  . Physical activity    Days per week: Not on file    Minutes per session: Not on file  . Stress: Not on file  Relationships  . Social Herbalist on phone: Not on file    Gets together: Not on file    Attends religious service: Not on file    Active member of club or organization: Not on file    Attends meetings of clubs or organizations: Not on file    Relationship status: Not on file  . Intimate partner violence    Fear of current or ex partner: Not on file    Emotionally abused: Not on file    Physically  abused: Not on file    Forced sexual activity: Not on file  Other Topics Concern  . Not on file  Social History Narrative  . Not on file    Outpatient Medications Prior to Visit  Medication Sig Dispense Refill  . ALPRAZolam (XANAX) 0.5 MG tablet Take 1 tablet by mouth 3 (three) times daily as needed for anxiety.    . ARIPiprazole (ABILIFY) 5 MG tablet Take 1 tablet (5 mg total) by mouth daily. 30 tablet 2  . atorvastatin (LIPITOR) 20 MG tablet Take 1 tablet (20 mg total) by mouth daily. 100 tablet 1  . clomiPRAMINE (ANAFRANIL) 75 MG capsule Take 75 mg by mouth at bedtime.    . gabapentin (NEURONTIN) 300 MG capsule Take 1 capsule (300 mg total) by mouth 3 (three) times daily as needed. 90 capsule 1  . lamoTRIgine (LAMICTAL) 100 MG tablet Take 1 tablet (100 mg total) by mouth daily. 30 tablet 2  . levothyroxine (SYNTHROID, LEVOTHROID) 75 MCG tablet Take 1 tablet (75 mcg total) by mouth daily. 90 tablet 3  . lisinopril (ZESTRIL) 10 MG tablet Take 1 tablet (10 mg total) by mouth daily. 100 tablet 1  . methylphenidate (RITALIN) 10 MG tablet Take 1 tablet by mouth 2 (two) times daily.    Marland Kitchen acetaminophen-codeine (TYLENOL #3) 300-30 MG tablet Take 1 tablet by mouth every 6 (six) hours as needed for moderate pain. (Patient not taking: Reported on 05/06/2019) 30 tablet 0   No facility-administered medications prior to visit.     Allergies  Allergen Reactions  . Pregabalin Anaphylaxis  . Celebrex [Celecoxib] Nausea And Vomiting    Stomach pain  . Effexor [Venlafaxine Hcl] Other (See Comments)    Serotonin toxicity  . Mirapex [Pramipexole]     Mirapex- Worsening "body jerks"  . Pristiq [Desvenlafaxine] Other (See Comments)    Serotonin toxicity   . Prozac [Fluoxetine Hcl] Swelling  . Symbyax [Olanzapine-Fluoxetine Hcl] Swelling  . Wellbutrin [Bupropion Hcl] Swelling    ROS Review of Systems  Constitutional: Negative.   HENT: Negative.   Eyes: Negative for photophobia and visual  disturbance.  Respiratory: Positive for apnea. Negative for chest tightness, shortness of breath and wheezing.   Cardiovascular: Negative.   Gastrointestinal: Negative.   Endocrine: Negative for polyphagia and polyuria.  Genitourinary: Negative.   Musculoskeletal: Negative  for gait problem and joint swelling.  Skin: Negative for pallor.  Neurological: Negative for light-headedness and numbness.  Hematological: Negative.       Objective:    Physical Exam  Constitutional: She is oriented to person, place, and time. She appears well-developed. No distress.  HENT:  Head: Normocephalic and atraumatic.  Right Ear: External ear normal.  Left Ear: External ear normal.  Neck: No tracheal deviation present.  Pulmonary/Chest: Effort normal. No stridor.  Neurological: She is alert and oriented to person, place, and time.  Skin: She is not diaphoretic.  Psychiatric: She has a normal mood and affect. Her behavior is normal.    Ht 5\' 4"  (1.626 m)   BMI 47.40 kg/m  Wt Readings from Last 3 Encounters:  04/19/19 276 lb 2 oz (125.2 kg)  03/01/19 276 lb 6.4 oz (125.4 kg)  02/08/19 282 lb (127.9 kg)   BP Readings from Last 3 Encounters:  04/30/19 136/80  04/19/19 130/76  03/01/19 (!) 142/39   Guideline developer:  UpToDate (see UpToDate for funding source) Date Released: June 2014  Health Maintenance Due  Topic Date Due  . HIV Screening  12/19/1987  . INFLUENZA VACCINE  04/06/2019  . PAP SMEAR-Modifier  07/15/2019    There are no preventive care reminders to display for this patient.  Lab Results  Component Value Date   TSH 2.68 11/12/2018   Lab Results  Component Value Date   WBC 11.7 (H) 03/01/2019   HGB 13.5 03/01/2019   HCT 42.5 03/01/2019   MCV 84.3 03/01/2019   PLT 315 03/01/2019   Lab Results  Component Value Date   NA 139 03/01/2019   K 4.2 03/01/2019   CO2 28 03/01/2019   GLUCOSE 125 (H) 03/01/2019   BUN 8 03/01/2019   CREATININE 0.84 03/01/2019   BILITOT  0.3 03/01/2019   ALKPHOS 123 03/01/2019   AST 16 03/01/2019   ALT 24 03/01/2019   PROT 7.6 03/01/2019   ALBUMIN 3.9 03/01/2019   CALCIUM 9.6 03/01/2019   ANIONGAP 8 03/01/2019   GFR 72.04 11/12/2018   Lab Results  Component Value Date   CHOL 166 11/12/2018   Lab Results  Component Value Date   HDL 35.80 (L) 11/12/2018   Lab Results  Component Value Date   LDLCALC 174 (H) 04/15/2015   Lab Results  Component Value Date   TRIG 221.0 (H) 11/12/2018   Lab Results  Component Value Date   CHOLHDL 5 11/12/2018   No results found for: HGBA1C    Assessment & Plan:   Problem List Items Addressed This Visit      Cardiovascular and Mediastinum   Essential hypertension   Relevant Orders   CBC   Comprehensive metabolic panel   Urinalysis, Routine w reflex microscopic     Respiratory   Obstructive sleep apnea syndrome     Endocrine   Adult hypothyroidism   Relevant Orders   TSH     Other   Elevated cholesterol - Primary   Relevant Orders   Comprehensive metabolic panel   LDL cholesterol, direct   Lipid panel   Class 3 severe obesity due to excess calories with serious comorbidity and body mass index (BMI) of 45.0 to 49.9 in adult [facility])   Relevant Orders   VITAMIN D 25 Hydroxy (Vit-D Deficiency, Fractures)   Amb ref to Medical Nutrition Therapy-MNT      No orders of the defined types were placed in this encounter.   Follow-up: Return in about  6 months (around 11/12/2019).   Patient will follow-up fasting for above ordered blood work in the next week or 2.  Encouraged her to exercise by walking.  She agrees to go for nutritional counseling.  Virtual Visit via Video Note  I connected with [name] on 05/15/19 at 10:00 AM EDT by a video enabled telemedicine application and verified that I am speaking with the correct person using two identifiers.  Location: Patient: home Provider:    I discussed the limitations of evaluation and management by  telemedicine and the availability of in person appointments. The patient expressed understanding and agreed to proceed.  History of Present Illness:    Observations/Objective:   Assessment and Plan:   Follow Up Instructions:    I discussed the assessment and treatment plan with the patient. The patient was provided an opportunity to ask questions and all were answered. The patient agreed with the plan and demonstrated an understanding of the instructions.   The patient was advised to call back or seek an in-person evaluation if the symptoms worsen or if the condition fails to improve as anticipated.  I provided 25 minutes of non-face-to-face time during this encounter.   Libby Maw, MD

## 2019-05-28 ENCOUNTER — Ambulatory Visit (INDEPENDENT_AMBULATORY_CARE_PROVIDER_SITE_OTHER): Payer: PPO | Admitting: Family Medicine

## 2019-05-28 ENCOUNTER — Encounter: Payer: Self-pay | Admitting: Family Medicine

## 2019-05-28 ENCOUNTER — Other Ambulatory Visit: Payer: Self-pay

## 2019-05-28 DIAGNOSIS — M25552 Pain in left hip: Secondary | ICD-10-CM

## 2019-05-28 MED ORDER — NABUMETONE 750 MG PO TABS: 750.0000 mg | ORAL_TABLET | Freq: Two times a day (BID) | ORAL | 6 refills | Status: DC | PRN

## 2019-05-28 NOTE — Progress Notes (Signed)
Office Visit Note   Patient: [name]           Date of Birth: 1970/12/21           MRN: [id number] Visit Date: 05/28/2019 Requested by: Libby Maw, [place],  [place] [address] PCP: Libby Maw, MD  Subjective: Chief Complaint  Patient presents with  . Left Hip - Pain    Last cortisone injection did help, but now has pain a little further lateral in the hip.    HPI: She is here with persistent left hip pain.  Her last injection helped with some of the catching pain in her groin, but she continues to have similar pain and she wondered whether another injection might help.  She needs to lose about 50 pounds before she can have hip replacement.              ROS:   All other systems were reviewed and are negative.  Objective: Vital Signs: There were no vitals taken for this visit.  Physical Exam:  General:  Alert and oriented, in no acute distress. Pulm:  Breathing unlabored. Psy:  Normal mood, congruent affect.  Left hip: No tenderness over the greater trochanter.  Her pain is still present with passive flexion and internal rotation.  No significant pain with resisted strength testing.  Imaging: None today.  Assessment & Plan: 1.  Persistent left hip pain most likely due to DJD -We will try anti-inflammatories.  She will focus on weight loss, minimizing intake of processed carbohydrates and sweets. -Could do another injection for the end of November if pain is severe and she has not lost adequate weight to proceed with surgery yet.     Procedures: No procedures performed  No notes on file     PMFS History: Patient Active Problem List   Diagnosis Date Noted  . Left hip pain 04/30/2019  . Otitis externa of left ear 04/30/2019  . Cholesteatoma of left ear 04/30/2019  . Dysfunction of left eustachian tube 04/30/2019  . Left ear pain 04/30/2019  . Sciatica 04/19/2019  . Class 3 severe obesity due to excess calories  with serious comorbidity and body mass index (BMI) of 45.0 to 49.9 in adult ([place]) 04/19/2019  . Post-nasal drip 01/10/2019  . Allergic cough 01/10/2019  . Obstructive sleep apnea syndrome 12/11/2018  . Acute frontal sinusitis 12/10/2018  . Primary osteoarthritis of left hip 11/13/2018  . Body mass index 45.0-49.9, adult ([place]) 11/13/2018  . Morbid obesity ([place]) 11/13/2018  . Elevated cholesterol 11/12/2018  . Essential hypertension 11/12/2018  . Allergic rhinitis due to pollen 11/12/2018  . Reactive airway disease 11/12/2018  . Recurrent major depression-severe ([place]) 04/17/2015  . GAD (generalized anxiety disorder) 04/15/2015  . Bipolar depression ([place]) 04/15/2015  . Severe recurrent major depression w/psychotic features, mood-congruent ([place]) 04/14/2015  . Anxiety disorder 04/14/2015  . Major depressive disorder, recurrent severe without psychotic features ([place]) 04/14/2015  . Suicidal ideations   . Chronic LBP 01/02/2015  . Adult hypothyroidism 06/25/2012   Past Medical History:  Diagnosis Date  . ADHD (attention deficit hyperactivity disorder)   . Anemia    with pregnancy  . Anxiety   . Arthritis   . Bipolar disorder ([place])   . Chronic back pain greater than 3 months duration   . Depression   . Dysuria    has to have self in and out cath   . Eroded bladder suspension mesh ([place])   .  Hepatitis    C  . Hypothyroidism   . Leg pain, bilateral   . Rosacea   . Sciatic nerve pain   . Social anxiety disorder   . Thyroid disease     Family History  Problem Relation Age of Onset  . Schizophrenia Father   . Schizophrenia Cousin     Past Surgical History:  Procedure Laterality Date  . BACK SURGERY     total of 6 back surgeries  . bladder mesh  2009  . DILITATION & CURRETTAGE/HYSTROSCOPY WITH NOVASURE ABLATION N/A 08/19/2016   Procedure: DILATATION & CURETTAGE WITH NOVASURE ABLATION;  Surgeon: [name], MD;  Location: [facility];  Service: Gynecology;  Laterality: N/A;  .  EUS N/A 09/09/2015   Procedure: UPPER ENDOSCOPIC ULTRASOUND (EUS) RADIAL;  Surgeon: [name], MD;  Location: [facility];  Service: Endoscopy;  Laterality: N/A;  . EYE SURGERY    . INCONTINENCE SURGERY    . SPINAL FUSION  2008   times two   Social History   Occupational History  . Not on file  Tobacco Use  . Smoking status: Former Smoker    Packs/day: 1.00    Types: Cigarettes    Quit date: 02/09/2016    Years since quitting: 3.2  . Smokeless tobacco: Never Used  . Tobacco comment: use vapor  Substance and Sexual Activity  . Alcohol use: No  . Drug use: No  . Sexual activity: Not on file

## 2019-06-11 DIAGNOSIS — K1239 Other oral mucositis (ulcerative): Secondary | ICD-10-CM | POA: Diagnosis not present

## 2019-06-11 DIAGNOSIS — K1370 Unspecified lesions of oral mucosa: Secondary | ICD-10-CM | POA: Diagnosis not present

## 2019-06-18 DIAGNOSIS — F411 Generalized anxiety disorder: Secondary | ICD-10-CM | POA: Diagnosis not present

## 2019-06-18 DIAGNOSIS — F429 Obsessive-compulsive disorder, unspecified: Secondary | ICD-10-CM | POA: Diagnosis not present

## 2019-06-18 DIAGNOSIS — F3132 Bipolar disorder, current episode depressed, moderate: Secondary | ICD-10-CM | POA: Diagnosis not present

## 2019-06-23 ENCOUNTER — Other Ambulatory Visit: Payer: Self-pay | Admitting: Family Medicine

## 2019-06-23 DIAGNOSIS — M5430 Sciatica, unspecified side: Secondary | ICD-10-CM

## 2019-06-28 ENCOUNTER — Other Ambulatory Visit: Payer: Self-pay

## 2019-06-28 DIAGNOSIS — Z20822 Contact with and (suspected) exposure to covid-19: Secondary | ICD-10-CM

## 2019-06-28 DIAGNOSIS — Z20828 Contact with and (suspected) exposure to other viral communicable diseases: Secondary | ICD-10-CM | POA: Diagnosis not present

## 2019-06-29 LAB — NOVEL CORONAVIRUS, NAA: SARS-CoV-2, NAA: DETECTED — AB

## 2019-06-30 ENCOUNTER — Telehealth: Payer: Self-pay | Admitting: Family Medicine

## 2019-06-30 NOTE — Telephone Encounter (Signed)
Received positive covid results- pt is no on mychart so called her  No answer, LMOM on her machine to this effect:  I just received your positive COVID result.  Please let me know what questions or concerns you may have.  You can also contact your primary doctor on Monday for advice.    In general, if you feel mildly ill you can treat COVID like a cold or flu.  If you are more ill or in any distress please seek care at the emergency room.  You should self- isolate at home for at least 10 days after symptoms appeared.    Please contact your primary doctor for advice about returning to work or school  Best, Lamar Blinks MD

## 2019-07-01 ENCOUNTER — Ambulatory Visit: Payer: Self-pay | Admitting: *Deleted

## 2019-07-01 NOTE — Telephone Encounter (Signed)
I returned pt's call.    She is positive for COVID-19.    She spoke  with a doctor over the weekend that was on call for Dr. Ethelene Hal (it was Dr. Lorelei Pont left her a message with the information pertaining to the positive COVID test result).     "She told me to treat it like the flu".  She was wondering what she could take for a cough as far as OTC medications.   She is also running a low grade fever.    I asked if she had any medical problems like high BP and she said she did have high BP.    I let her know not to use the Thera-flu due to her BP.    I recommended Coricidin HBP.   She mentioned she was familiar with it.   She is using cough drops too which she said helps.   I cautioned her about not taking Tylenol along with cold medications that already have acetaminophen in them.   She verbalized understanding.   I went over the care advice with her.   I instructed her to call us back if she is not improving over the next 2-3 days.    Reason for Disposition . Cough with cold symptoms (e.g., runny nose, postnasal drip, throat clearing)  Answer Assessment - Initial Assessment Questions 1. ONSET: "When did the cough begin?"      COVID-19 positive.   Cough started last Wednesday. 2. SEVERITY: "How bad is the cough today?"      I have lost my taste and smell that how I knew I had the coronavirus.   1-2 times a day I cough so hard I gag.   It's a brown green sputum. 3. RESPIRATORY DISTRESS: "Describe your breathing."      I have slight wheezing.   No lung history. 4. FEVER: "Do you have a fever?" If so, ask: "What is your temperature, how was it measured, and when did it start?"     Fever 99 Yesterday.   Now it's 99.4.  No body aches or headaches.   My left ear feels like it has fluid in it.   A little nasal congestion but very mild. 5. SPUTUM: "Describe the color of your sputum" (clear, white, yellow, green)     Brown-green 6. HEMOPTYSIS: "Are you coughing up any blood?" If so ask: "How much?"  (flecks, streaks, tablespoons, etc.)     No 7. CARDIAC HISTORY: "Do you have any history of heart disease?" (e.g., heart attack, congestive heart failure)      No 8. LUNG HISTORY: "Do you have any history of lung disease?"  (e.g., pulmonary embolus, asthma, emphysema)     No 9. PE RISK FACTORS: "Do you have a history of blood clots?" (or: recent major surgery, recent prolonged travel, bedridden)     No blood closts 10. OTHER SYMPTOMS: "Do you have any other symptoms?" (e.g., runny nose, wheezing, chest pain)       No chest pain or tightness 11. PREGNANCY: "Is there any chance you are pregnant?" "When was your last menstrual period?"       No! 12. TRAVEL: "Have you traveled out of the country in the last month?" (e.g., travel history, exposures)       *No Answer*  Protocols used: [place]

## 2019-07-09 ENCOUNTER — Other Ambulatory Visit: Payer: Self-pay

## 2019-07-09 ENCOUNTER — Ambulatory Visit (INDEPENDENT_AMBULATORY_CARE_PROVIDER_SITE_OTHER): Payer: PPO | Admitting: Family Medicine

## 2019-07-09 ENCOUNTER — Telehealth: Payer: Self-pay | Admitting: Internal Medicine

## 2019-07-09 ENCOUNTER — Encounter: Payer: Self-pay | Admitting: Family Medicine

## 2019-07-09 DIAGNOSIS — U071 COVID-19: Secondary | ICD-10-CM | POA: Diagnosis not present

## 2019-07-09 NOTE — Progress Notes (Signed)
[name] - 48 y.o. female MRN [id number]  Date of birth: [birth year]   This visit type was conducted due to national recommendations for restrictions regarding the COVID-19 Pandemic (e.g. social distancing).  This format is felt to be most appropriate for this patient at this time.  All issues noted in this document were discussed and addressed.  No physical exam was performed (except for noted visual exam findings with Video Visits).  I discussed the limitations of evaluation and management by telemedicine and the availability of in person appointments. The patient expressed understanding and agreed to proceed.  I connected with@ on 07/09/19 at  1:20 PM EST by a video enabled telemedicine application and verified that I am speaking with the correct person using two identifiers.  Present for visit:  Blaine Hillary Schwegler   Patient Location: Home [address] [place] [address]   Provider location:   [name]  Chief Complaint  Patient presents with  . Cough    positive covid test 10/23.Marland Kitchen patient still has fever, cough, tired, headaches,bodyaches and left ear pain sounds like ear drainage.    HPI  [name] is a 48 y.o. female who presents via audio/video conferencing for a telehealth visit today.  She is being seen today for complaint of ear pain.  She tested + for COVID-19 on 06/28/2019.  Since that time she has had persistent low grade fever (100.2-100.81F), chills, mild cough and shortness of breath, fatigue, and headaches.  She did have diarrhea but this has resolved as of 2 days ago.  She has also had intermittent ear pain with decreased hearing on the L.  She feels like there is fluid that comes and goes.  She denies drainage from the ear or dizziness.  She has been taking coricidin for symptoms management.    ROS:  A comprehensive ROS was completed and negative except as noted per HPI  Past Medical History:  Diagnosis Date  . ADHD (attention  deficit hyperactivity disorder)   . Anemia    with pregnancy  . Anxiety   . Arthritis   . Bipolar disorder ([place])   . Chronic back pain greater than 3 months duration   . Depression   . Dysuria    has to have self in and out cath   . Eroded bladder suspension mesh ([place])   . Hepatitis    C  . Hypothyroidism   . Leg pain, bilateral   . Rosacea   . Sciatic nerve pain   . Social anxiety disorder   . Thyroid disease     Past Surgical History:  Procedure Laterality Date  . BACK SURGERY     total of 6 back surgeries  . bladder mesh  2009  . DILITATION & CURRETTAGE/HYSTROSCOPY WITH NOVASURE ABLATION N/A 08/19/2016   Procedure: DILATATION & CURETTAGE WITH NOVASURE ABLATION;  Surgeon: [name], MD;  Location: [facility];  Service: Gynecology;  Laterality: N/A;  . EUS N/A 09/09/2015   Procedure: UPPER ENDOSCOPIC ULTRASOUND (EUS) RADIAL;  Surgeon: [name], MD;  Location: [facility];  Service: Endoscopy;  Laterality: N/A;  . EYE SURGERY    . INCONTINENCE SURGERY    . SPINAL FUSION  2008   times two    Family History  Problem Relation Age of Onset  . Schizophrenia Father   . Schizophrenia Cousin     Social History   Socioeconomic History  . Marital status: Married    Spouse name: Not on file  .  Number of children: Not on file  . Years of education: Not on file  . Highest education level: Not on file  Occupational History  . Not on file  Social Needs  . Financial resource strain: Not on file  . Food insecurity    Worry: Not on file    Inability: Not on file  . Transportation needs    Medical: Not on file    Non-medical: Not on file  Tobacco Use  . Smoking status: Former Smoker    Packs/day: 1.00    Types: Cigarettes    Quit date: 02/09/2016    Years since quitting: 3.4  . Smokeless tobacco: Never Used  . Tobacco comment: use vapor  Substance and Sexual Activity  . Alcohol use: No  . Drug use: No  . Sexual activity: Not on file  Lifestyle  . Physical  activity    Days per week: Not on file    Minutes per session: Not on file  . Stress: Not on file  Relationships  . Social Herbalist on phone: Not on file    Gets together: Not on file    Attends religious service: Not on file    Active member of club or organization: Not on file    Attends meetings of clubs or organizations: Not on file    Relationship status: Not on file  . Intimate partner violence    Fear of current or ex partner: Not on file    Emotionally abused: Not on file    Physically abused: Not on file    Forced sexual activity: Not on file  Other Topics Concern  . Not on file  Social History Narrative  . Not on file     Current Outpatient Medications:  .  ALPRAZolam (XANAX) 0.5 MG tablet, Take 1 tablet by mouth 3 (three) times daily as needed for anxiety., Disp: , Rfl:  .  ARIPiprazole (ABILIFY) 5 MG tablet, Take 1 tablet (5 mg total) by mouth daily., Disp: 30 tablet, Rfl: 2 .  atorvastatin (LIPITOR) 20 MG tablet, Take 1 tablet (20 mg total) by mouth daily., Disp: 100 tablet, Rfl: 1 .  clomiPRAMINE (ANAFRANIL) 75 MG capsule, Take 75 mg by mouth at bedtime., Disp: , Rfl:  .  gabapentin (NEURONTIN) 300 MG capsule, TAKE 1 CAPSULE(300 MG) BY MOUTH THREE TIMES DAILY AS NEEDED, Disp: 90 capsule, Rfl: 1 .  lamoTRIgine (LAMICTAL) 100 MG tablet, Take 1 tablet (100 mg total) by mouth daily., Disp: 30 tablet, Rfl: 2 .  levothyroxine (SYNTHROID, LEVOTHROID) 75 MCG tablet, Take 1 tablet (75 mcg total) by mouth daily., Disp: 90 tablet, Rfl: 3 .  lisinopril (ZESTRIL) 10 MG tablet, Take 1 tablet (10 mg total) by mouth daily., Disp: 100 tablet, Rfl: 1 .  methylphenidate (RITALIN) 10 MG tablet, Take 1 tablet by mouth 2 (two) times daily., Disp: , Rfl:  .  nabumetone (RELAFEN) 750 MG tablet, Take 1 tablet (750 mg total) by mouth 2 (two) times daily as needed. (Patient not taking: Reported on 07/09/2019), Disp: 60 tablet, Rfl: 6  EXAM:  VITALS per patient if applicable: BP  0000000 Comment: per pt.  Temp 99.4 F (37.4 C) (Tympanic) Comment: per pt. Comment (Src): per pt.  Ht 5\' 4"  (1.626 m)   Wt 270 lb (122.5 kg) Comment: per pt.  BMI 46.35 kg/m   GENERAL: alert, oriented, appears well and in no acute distress  HEENT: atraumatic, conjunttiva clear, no obvious abnormalities on inspection of  external nose and ears  NECK: normal movements of the head and neck  LUNGS: on inspection no signs of respiratory distress, breathing rate appears normal, no obvious gross SOB, gasping or wheezing  CV: no obvious cyanosis  MS: moves all visible extremities without noticeable abnormality  PSYCH/NEURO: pleasant and cooperative, no obvious depression or anxiety, speech and thought processing grossly intact  ASSESSMENT AND PLAN:  Discussed the following assessment and plan:  COVID-19 -Continued symptoms related to COVID-19 -It sounds like she likely has serous OM with intermittent relief when eustachian tube drains.  Recommend she continue symptomatic treatment and addition of mucinex and flonase to help with eustachian tube dysfunction.  Discussed that if she has worsening pain in the ear that doesn't relent to contact our clinic.  -Discussed that she continue to remain isolated as long as she is febrile and symptoms are not improving.  She expresses understanding.        I discussed the assessment and treatment plan with the patient. The patient was provided an opportunity to ask questions and all were answered. The patient agreed with the plan and demonstrated an understanding of the instructions.   The patient was advised to call back or seek an in-person evaluation if the symptoms worsen or if the condition fails to improve as anticipated.    Luetta Nutting, DO

## 2019-07-09 NOTE — Assessment & Plan Note (Signed)
-  Continued symptoms related to COVID-19 -It sounds like she likely has serous OM with intermittent relief when eustachian tube drains.  Recommend she continue symptomatic treatment and addition of mucinex and flonase to help with eustachian tube dysfunction.  Discussed that if she has worsening pain in the ear that doesn't relent to contact our clinic.  -Discussed that she continue to remain isolated as long as she is febrile and symptoms are not improving.  She expresses understanding.

## 2019-07-09 NOTE — Telephone Encounter (Signed)
No callback number listed to see who is requesting this info.  No name provided to ask for on main number to verify.  Also, [employer] has access to Epic, so I am unsure why this is being requested of our office.  Nothing further needed at this time- will close encounter.

## 2019-07-11 ENCOUNTER — Ambulatory Visit: Payer: Self-pay

## 2019-07-11 DIAGNOSIS — H6690 Otitis media, unspecified, unspecified ear: Secondary | ICD-10-CM

## 2019-07-11 MED ORDER — AMOXICILLIN 500 MG PO CAPS: 500.0000 mg | ORAL_CAPSULE | Freq: Three times a day (TID) | ORAL | 0 refills | Status: DC

## 2019-07-11 NOTE — Telephone Encounter (Signed)
Pt. Reports she just had a visit and was told to call back if her ear pain worsened. States the ear pain is waking her up at night. Temps. Running 99-100. Reports she is taking Coricidin. Please advise if something else can be done for her ear pain. Spoke with Elmyra Ricks in the practice and forward triage over for review.  Answer Assessment - Initial Assessment Questions 1. LOCATION: "Which ear is involved?"     Both ears 2. ONSET: "When did the ear start hurting"      Started  2 weeks ago but getting worse 3. SEVERITY: "How bad is the pain?"  (Scale 1-10; mild, moderate or severe)   - MILD (1-3): doesn't interfere with normal activities    - MODERATE (4-7): interferes with normal activities or awakens from sleep    - SEVERE (8-10): excruciating pain, unable to do any normal activities      5 4. URI SYMPTOMS: " Do you have a runny nose or cough?"     Cough 5. FEVER: "Do you have a fever?" If so, ask: "What is your temperature, how was it measured, and when did it start?"     Yes 99-100 6. CAUSE: "Have you been swimming recently?", "How often do you use Q-TIPS?", "Have you had any recent air travel or scuba diving?"     Ear coricidan 7. OTHER SYMPTOMS: "Do you have any other symptoms?" (e.g., headache, stiff neck, dizziness, vomiting, runny nose, decreased hearing)    Decreased hearing 8. PREGNANCY: "Is there any chance you are pregnant?" "When was your last menstrual period?"     No  Protocols used: [facility]

## 2019-07-11 NOTE — Telephone Encounter (Signed)
Pt saw Dr. Zigmund Daniel virtually on 11/3 since we were full. He advised for her to call back if her ear pain didn't improve.

## 2019-07-12 ENCOUNTER — Ambulatory Visit: Payer: PPO | Admitting: Internal Medicine

## 2019-07-12 ENCOUNTER — Other Ambulatory Visit: Payer: Self-pay

## 2019-07-12 ENCOUNTER — Other Ambulatory Visit: Payer: Self-pay | Admitting: Family Medicine

## 2019-07-12 DIAGNOSIS — Z20822 Contact with and (suspected) exposure to covid-19: Secondary | ICD-10-CM

## 2019-07-12 DIAGNOSIS — H6690 Otitis media, unspecified, unspecified ear: Secondary | ICD-10-CM

## 2019-07-12 NOTE — Telephone Encounter (Signed)
Medication: amoxicillin (AMOXIL) 500 MG capsule [id number] - can this be resent please? The pharmacy has not received the script.  Has the patient contacted their pharmacy? Yes  (Agent: If no, request that the patient contact the pharmacy for the refill.) (Agent: If yes, when and what did the pharmacy advise?)  Preferred Pharmacy (with phone number or street name):   [facility] RE:[id number] [name], [place] [phone] (Phone) [phone] (Fax)    Agent: Please be advised that RX refills may take up to 3 business days. We ask that you follow-up with your pharmacy.

## 2019-07-12 NOTE — Telephone Encounter (Signed)
I called and spoke with pt. She will complete antibiotic & decongestant & let us know if she has not improved after completing both.

## 2019-07-13 LAB — NOVEL CORONAVIRUS, NAA: SARS-CoV-2, NAA: NOT DETECTED

## 2019-07-15 ENCOUNTER — Telehealth: Payer: Self-pay | Admitting: Family Medicine

## 2019-07-15 MED ORDER — AMOXICILLIN 500 MG PO CAPS: 500.0000 mg | ORAL_CAPSULE | Freq: Three times a day (TID) | ORAL | 0 refills | Status: DC

## 2019-07-15 NOTE — Telephone Encounter (Signed)
Rx resent.

## 2019-07-15 NOTE — Telephone Encounter (Signed)
Late Entry- pt called after hours service indicating she was supposed to have an Amoxicillin prescription for ear infxn after visit last week.  No prescription available.  Verbal order given.

## 2019-08-06 NOTE — Progress Notes (Signed)
Virtual Visit via Video Note  I connected with patient on 08/07/19 at  2:30 PM EST by audio enabled telemedicine application and verified that I am speaking with the correct person using two identifiers.   THIS ENCOUNTER IS A VIRTUAL VISIT DUE TO COVID-19 - PATIENT WAS NOT SEEN IN THE OFFICE. PATIENT HAS CONSENTED TO VIRTUAL VISIT / TELEMEDICINE VISIT   Location of patient: home  Location of provider: office  I discussed the limitations of evaluation and management by telemedicine and the availability of in person appointments. The patient expressed understanding and agreed to proceed.   Subjective:   [name] is a 48 y.o. female who presents for Medicare Annual (Subsequent) preventive examination.  Review of Systems:  Home Safety/Smoke Alarms: Feels safe in home. Smoke alarms in place.  Lives w/ husband,son (19), daughter (14).   Female:   Pap- 07/14/16      Mammo- 08/31/18          Objective:     Vitals: Unable to assess. This visit is enabled though telemedicine due to Covid 19.   Advanced Directives 08/07/2019 02/08/2019 11/25/2018 08/13/2018 08/10/2016 09/09/2015 07/21/2015  Does Patient Have a Medical Advance Directive? No No No No No No No  Would patient like information on creating a medical advance directive? No - Patient declined - - - Yes (MAU/Ambulatory/Procedural Areas - Information given) No - patient declined information No - patient declined information  Some encounter information is confidential and restricted. Go to Review Flowsheets activity to see all data.    Tobacco Social History   Tobacco Use  Smoking Status Former Smoker  . Packs/day: 1.00  . Types: Cigarettes  . Quit date: 02/09/2016  . Years since quitting: 3.4  Smokeless Tobacco Never Used  Tobacco Comment   use vapor     Counseling given: Not Answered Comment: use vapor   Clinical Intake: Pain : No/denies pain   Past Medical History:  Diagnosis Date  . ADHD (attention deficit  hyperactivity disorder)   . Anemia    with pregnancy  . Anxiety   . Arthritis   . Bipolar disorder ([place])   . Chronic back pain greater than 3 months duration   . Depression   . Dysuria    has to have self in and out cath   . Eroded bladder suspension mesh ([place])   . Hepatitis    C  . Hypothyroidism   . Leg pain, bilateral   . Rosacea   . Sciatic nerve pain   . Social anxiety disorder   . Thyroid disease    Past Surgical History:  Procedure Laterality Date  . BACK SURGERY     total of 6 back surgeries  . bladder mesh  2009  . DILITATION & CURRETTAGE/HYSTROSCOPY WITH NOVASURE ABLATION N/A 08/19/2016   Procedure: DILATATION & CURETTAGE WITH NOVASURE ABLATION;  Surgeon: [name], MD;  Location: [facility];  Service: Gynecology;  Laterality: N/A;  . EUS N/A 09/09/2015   Procedure: UPPER ENDOSCOPIC ULTRASOUND (EUS) RADIAL;  Surgeon: [name], MD;  Location: [facility];  Service: Endoscopy;  Laterality: N/A;  . EYE SURGERY    . INCONTINENCE SURGERY    . SPINAL FUSION  2008   times two   Family History  Problem Relation Age of Onset  . Schizophrenia Father   . Schizophrenia Cousin    Social History   Socioeconomic History  . Marital status: Married    Spouse name: Not on file  . Number of  children: Not on file  . Years of education: Not on file  . Highest education level: Not on file  Occupational History  . Not on file  Social Needs  . Financial resource strain: Not on file  . Food insecurity    Worry: Not on file    Inability: Not on file  . Transportation needs    Medical: Not on file    Non-medical: Not on file  Tobacco Use  . Smoking status: Former Smoker    Packs/day: 1.00    Types: Cigarettes    Quit date: 02/09/2016    Years since quitting: 3.4  . Smokeless tobacco: Never Used  . Tobacco comment: use vapor  Substance and Sexual Activity  . Alcohol use: No  . Drug use: No  . Sexual activity: Not on file  Lifestyle  . Physical activity    Days  per week: Not on file    Minutes per session: Not on file  . Stress: Not on file  Relationships  . Social Herbalist on phone: Not on file    Gets together: Not on file    Attends religious service: Not on file    Active member of club or organization: Not on file    Attends meetings of clubs or organizations: Not on file    Relationship status: Not on file  Other Topics Concern  . Not on file  Social History Narrative  . Not on file    Outpatient Encounter Medications as of 08/07/2019  Medication Sig  . ALPRAZolam (XANAX) 0.5 MG tablet Take 1 tablet by mouth 3 (three) times daily as needed for anxiety.  . ARIPiprazole (ABILIFY) 5 MG tablet Take 1 tablet (5 mg total) by mouth daily.  Marland Kitchen atorvastatin (LIPITOR) 20 MG tablet Take 1 tablet (20 mg total) by mouth daily.  . clomiPRAMINE (ANAFRANIL) 75 MG capsule Take 75 mg by mouth at bedtime.  . gabapentin (NEURONTIN) 300 MG capsule TAKE 1 CAPSULE(300 MG) BY MOUTH THREE TIMES DAILY AS NEEDED  . lamoTRIgine (LAMICTAL) 100 MG tablet Take 1 tablet (100 mg total) by mouth daily.  Marland Kitchen levothyroxine (SYNTHROID, LEVOTHROID) 75 MCG tablet Take 1 tablet (75 mcg total) by mouth daily.  Marland Kitchen lisinopril (ZESTRIL) 10 MG tablet Take 1 tablet (10 mg total) by mouth daily.  . methylphenidate (RITALIN) 10 MG tablet Take 1 tablet by mouth 2 (two) times daily.  . nabumetone (RELAFEN) 750 MG tablet Take 1 tablet (750 mg total) by mouth 2 (two) times daily as needed.  . [DISCONTINUED] amoxicillin (AMOXIL) 500 MG capsule Take 1 capsule (500 mg total) by mouth 3 (three) times daily.   No facility-administered encounter medications on file as of 08/07/2019.     Activities of Daily Living In your present state of health, do you have any difficulty performing the following activities: 08/07/2019  Hearing? N  Vision? N  Difficulty concentrating or making decisions? N  Walking or climbing stairs? N  Dressing or bathing? N  Doing errands, shopping? N   Preparing Food and eating ? N  Using the Toilet? N  In the past six months, have you accidently leaked urine? N  Do you have problems with loss of bowel control? N  Managing your Medications? N  Managing your Finances? N  Housekeeping or managing your Housekeeping? N  Some recent data might be hidden    Patient Care Team: Libby Maw, MD as PCP - General (Family Medicine)    Assessment:  This is a routine wellness examination for [place]. Physical assessment deferred to PCP.   Exercise Activities and Dietary recommendations Current Exercise Habits: The patient does not participate in regular exercise at present, Exercise limited by: None identified Diet (meal preparation, eat out, water intake, caffeinated beverages, dairy products, fruits and vegetables): in general, a "healthy" diet  , on average, 1-2 meals per day    Goals    . Increase physical activity       Fall Risk Fall Risk  08/07/2019  Falls in the past year? 0  Follow up Education provided;Falls prevention discussed     Depression Screen PHQ 2/9 Scores 08/07/2019  PHQ - 2 Score 0  Some encounter information is confidential and restricted. Go to Review Flowsheets activity to see all data.     Cognitive Function Ad8 score reviewed for issues:  Issues making decisions:no  Less interest in hobbies / activities:no  Repeats questions, stories (family complaining):no  Trouble using ordinary gadgets (microwave, computer, phone):no  Forgets the month or year: no  Mismanaging finances: no  Remembering appts:no  Daily problems with thinking and/or memory:no Ad8 score is=0         Immunization History  Administered Date(s) Administered  . Hepatitis A, Ped/Adol-2 Dose 12/22/2016  . Hepatitis B, adult 06/17/2016, 08/02/2016, 12/22/2016  . Pneumococcal Polysaccharide-23 10/21/2016  . Tdap 10/15/2008, 02/08/2019    Screening Tests Health Maintenance  Topic Date Due  . HIV Screening   12/19/1987  . INFLUENZA VACCINE  04/06/2019  . PAP SMEAR-Modifier  07/15/2019  . TETANUS/TDAP  02/07/2029     Plan:    Please schedule your next medicare wellness visit with me in 1 yr.  Continue to eat heart healthy diet (full of fruits, vegetables, whole grains, lean protein, water--limit salt, fat, and sugar intake) and increase physical activity as tolerated.  Continue doing brain stimulating activities (puzzles, reading, adult coloring books, staying active) to keep memory sharp.     I have personally reviewed and noted the following in the patient's chart:   . Medical and social history . Use of alcohol, tobacco or illicit drugs  . Current medications and supplements . Functional ability and status . Nutritional status . Physical activity . Advanced directives . List of other physicians . Hospitalizations, surgeries, and ER visits in previous 12 months . Vitals . Screenings to include cognitive, depression, and falls . Referrals and appointments  In addition, I have reviewed and discussed with patient certain preventive protocols, quality metrics, and best practice recommendations. A written personalized care plan for preventive services as well as general preventive health recommendations were provided to patient.     Shela Nevin, [place]  08/07/2019

## 2019-08-07 ENCOUNTER — Encounter: Payer: Self-pay | Admitting: *Deleted

## 2019-08-07 ENCOUNTER — Ambulatory Visit (INDEPENDENT_AMBULATORY_CARE_PROVIDER_SITE_OTHER): Payer: PPO | Admitting: *Deleted

## 2019-08-07 DIAGNOSIS — Z0000 Encounter for general adult medical examination without abnormal findings: Secondary | ICD-10-CM | POA: Diagnosis not present

## 2019-08-07 NOTE — Patient Instructions (Addendum)
Please schedule your next medicare wellness visit with me in 1 yr.  Continue to eat heart healthy diet (full of fruits, vegetables, whole grains, lean protein, water--limit salt, fat, and sugar intake) and increase physical activity as tolerated.  Continue doing brain stimulating activities (puzzles, reading, adult coloring books, staying active) to keep memory sharp.    [name] , Thank you for taking time to come for your Medicare Wellness Visit. I appreciate your ongoing commitment to your health goals. Please review the following plan we discussed and let me know if I can assist you in the future.   These are the goals we discussed: Goals    . Increase physical activity       This is a list of the screening recommended for you and due dates:  Health Maintenance  Topic Date Due  . HIV Screening  12/19/1987  . Flu Shot  04/06/2019  . Pap Smear  07/15/2019  . Tetanus Vaccine  02/07/2029    Preventive Care 46-10 Years Old, Female Preventive care refers to visits with your health care provider and lifestyle choices that can promote health and wellness. This includes:  A yearly physical exam. This may also be called an annual well check.  Regular dental visits and eye exams.  Immunizations.  Screening for certain conditions.  Healthy lifestyle choices, such as eating a healthy diet, getting regular exercise, not using drugs or products that contain nicotine and tobacco, and limiting alcohol use. What can I expect for my preventive care visit? Physical exam Your health care provider will check your:  Height and weight. This may be used to calculate body mass index (BMI), which tells if you are at a healthy weight.  Heart rate and blood pressure.  Skin for abnormal spots. Counseling Your health care provider may ask you questions about your:  Alcohol, tobacco, and drug use.  Emotional well-being.  Home and relationship well-being.  Sexual activity.  Eating  habits.  Work and work Statistician.  Method of birth control.  Menstrual cycle.  Pregnancy history. What immunizations do I need?  Influenza (flu) vaccine  This is recommended every year. Tetanus, diphtheria, and pertussis (Tdap) vaccine  You may need a Td booster every 10 years. Varicella (chickenpox) vaccine  You may need this if you have not been vaccinated. Zoster (shingles) vaccine  You may need this after age 37. Measles, mumps, and rubella (MMR) vaccine  You may need at least one dose of MMR if you were born in 1957 or later. You may also need a second dose. Pneumococcal conjugate (PCV13) vaccine  You may need this if you have certain conditions and were not previously vaccinated. Pneumococcal polysaccharide (PPSV23) vaccine  You may need one or two doses if you smoke cigarettes or if you have certain conditions. Meningococcal conjugate (MenACWY) vaccine  You may need this if you have certain conditions. Hepatitis A vaccine  You may need this if you have certain conditions or if you travel or work in places where you may be exposed to hepatitis A. Hepatitis B vaccine  You may need this if you have certain conditions or if you travel or work in places where you may be exposed to hepatitis B. Haemophilus influenzae type b (Hib) vaccine  You may need this if you have certain conditions. Human papillomavirus (HPV) vaccine  If recommended by your health care provider, you may need three doses over 6 months. You may receive vaccines as individual doses or as more than  one vaccine together in one shot (combination vaccines). Talk with your health care provider about the risks and benefits of combination vaccines. What tests do I need? Blood tests  Lipid and cholesterol levels. These may be checked every 5 years, or more frequently if you are over 46 years old.  Hepatitis C test.  Hepatitis B test. Screening  Lung cancer screening. You may have this screening  every year starting at age 46 if you have a 30-pack-year history of smoking and currently smoke or have quit within the past 15 years.  Colorectal cancer screening. All adults should have this screening starting at age 46 and continuing until age 46. Your health care provider may recommend screening at age 46 if you are at increased risk. You will have tests every 1-10 years, depending on your results and the type of screening test.  Diabetes screening. This is done by checking your blood sugar (glucose) after you have not eaten for a while (fasting). You may have this done every 1-3 years.  Mammogram. This may be done every 1-2 years. Talk with your health care provider about when you should start having regular mammograms. This may depend on whether you have a family history of breast cancer.  BRCA-related cancer screening. This may be done if you have a family history of breast, ovarian, tubal, or peritoneal cancers.  Pelvic exam and Pap test. This may be done every 3 years starting at age 46. Starting at age 46, this may be done every 5 years if you have a Pap test in combination with an HPV test. Other tests  Sexually transmitted disease (STD) testing.  Bone density scan. This is done to screen for osteoporosis. You may have this scan if you are at high risk for osteoporosis. Follow these instructions at home: Eating and drinking  Eat a diet that includes fresh fruits and vegetables, whole grains, lean protein, and low-fat dairy.  Take vitamin and mineral supplements as recommended by your health care provider.  Do not drink alcohol if: ? Your health care provider tells you not to drink. ? You are pregnant, may be pregnant, or are planning to become pregnant.  If you drink alcohol: ? Limit how much you have to 0-1 drink a day. ? Be aware of how much alcohol is in your drink. In the [place] one drink equals one 12 oz bottle of beer (355 mL), one 5 oz glass of wine (148 mL), or one 1  oz glass of hard liquor (44 mL). Lifestyle  Take daily care of your teeth and gums.  Stay active. Exercise for at least 30 minutes on 5 or more days each week.  Do not use any products that contain nicotine or tobacco, such as cigarettes, e-cigarettes, and chewing tobacco. If you need help quitting, ask your health care provider.  If you are sexually active, practice safe sex. Use a condom or other form of birth control (contraception) in order to prevent pregnancy and STIs (sexually transmitted infections).  If told by your health care provider, take low-dose aspirin daily starting at age 81. What's next?  Visit your health care provider once a year for a well check visit.  Ask your health care provider how often you should have your eyes and teeth checked.  Stay up to date on all vaccines. This information is not intended to replace advice given to you by your health care provider. Make sure you discuss any questions you have with your health care provider.  Document Released: 09/18/2015 Document Revised: 05/03/2018 Document Reviewed: 05/03/2018 Elsevier Patient Education  2020 [employer].

## 2019-08-20 DIAGNOSIS — K134 Granuloma and granuloma-like lesions of oral mucosa: Secondary | ICD-10-CM | POA: Diagnosis not present

## 2019-08-22 ENCOUNTER — Other Ambulatory Visit: Payer: Self-pay | Admitting: Family Medicine

## 2019-08-22 DIAGNOSIS — E7800 Pure hypercholesterolemia, unspecified: Secondary | ICD-10-CM

## 2019-08-22 MED ORDER — ATORVASTATIN CALCIUM 20 MG PO TABS: 20.0000 mg | ORAL_TABLET | Freq: Every day | ORAL | 0 refills | Status: DC

## 2019-09-06 DIAGNOSIS — C44529 Squamous cell carcinoma of skin of other part of trunk: Secondary | ICD-10-CM

## 2019-09-06 HISTORY — DX: Squamous cell carcinoma of skin of other part of trunk: C44.529

## 2019-09-16 DIAGNOSIS — F3132 Bipolar disorder, current episode depressed, moderate: Secondary | ICD-10-CM | POA: Diagnosis not present

## 2019-09-16 DIAGNOSIS — F429 Obsessive-compulsive disorder, unspecified: Secondary | ICD-10-CM | POA: Diagnosis not present

## 2019-09-16 DIAGNOSIS — F411 Generalized anxiety disorder: Secondary | ICD-10-CM | POA: Diagnosis not present

## 2019-10-07 ENCOUNTER — Ambulatory Visit: Payer: PPO | Admitting: Family Medicine

## 2019-10-22 ENCOUNTER — Other Ambulatory Visit: Payer: Self-pay

## 2019-10-22 ENCOUNTER — Ambulatory Visit: Payer: Self-pay

## 2019-10-22 ENCOUNTER — Ambulatory Visit (INDEPENDENT_AMBULATORY_CARE_PROVIDER_SITE_OTHER): Payer: PPO | Admitting: Family Medicine

## 2019-10-22 VITALS — BP 133/82 | HR 100 | Ht 64.0 in | Wt 280.8 lb

## 2019-10-22 DIAGNOSIS — M1612 Unilateral primary osteoarthritis, left hip: Secondary | ICD-10-CM | POA: Diagnosis not present

## 2019-10-22 DIAGNOSIS — M25552 Pain in left hip: Secondary | ICD-10-CM

## 2019-10-22 NOTE — Progress Notes (Signed)
ultr

## 2019-10-22 NOTE — Progress Notes (Signed)
Office Visit Note   Patient: [name]           Date of Birth: November 24, 1970           MRN: [id number] Visit Date: 10/22/2019 Requested by: Libby Maw, [address],  [place] [address] PCP: Libby Maw, MD  Subjective: Chief Complaint  Patient presents with  . Left Hip - Pain    Pain left groin returned end of November. Occasional pain into the left knee and occasionally to the top of the left foot. Requests another hip injection.    HPI: She is here with recurrent left hip pain.  Last injection helped her for about 2-1/2 months.  It is hurting again in the groin area and she would like to try another injection.  She is also having some pain in the left knee and foot but she thinks it is unrelated.  She does not feel like it is coming from her back.              ROS:   All other systems were reviewed and are negative.  Objective: Vital Signs: BP 133/82   Pulse 100   Ht 5\' 4"  (1.626 m)   Wt 280 lb 12.8 oz (127.4 kg)   BMI 48.20 kg/m   Physical Exam:  General:  Alert and oriented, in no acute distress. Pulm:  Breathing unlabored. Psy:  Normal mood, congruent affect.  Left hip: She has pain in the groin area with internal rotation.  Imaging: None today  Assessment & Plan: 1.  Recurrent left hip pain probably due to DJD -We will reinject with cortisone under ultrasound guidance.  Follow-up as needed.     Procedures: Ultrasound-guided left hip injection: After sterile prep with Betadine, injected 8 cc 1% lidocaine without epinephrine and 40 mg methylprednisolone using a 22-gauge spinal needle, passing the needle through the iliofemoral ligament into the femoral head/neck junction.  Injectate seen filling joint capsule.  Modest immediate relief.       PMFS History: Patient Active Problem List   Diagnosis Date Noted  . COVID-19 07/09/2019  . Left hip pain 04/30/2019  . Otitis externa of left ear 04/30/2019  .  Cholesteatoma of left ear 04/30/2019  . Dysfunction of left eustachian tube 04/30/2019  . Left ear pain 04/30/2019  . Sciatica 04/19/2019  . Class 3 severe obesity due to excess calories with serious comorbidity and body mass index (BMI) of 45.0 to 49.9 in adult ([place]) 04/19/2019  . Post-nasal drip 01/10/2019  . Allergic cough 01/10/2019  . Obstructive sleep apnea syndrome 12/11/2018  . Acute frontal sinusitis 12/10/2018  . Primary osteoarthritis of left hip 11/13/2018  . Body mass index 45.0-49.9, adult ([place]) 11/13/2018  . Morbid obesity ([place]) 11/13/2018  . Elevated cholesterol 11/12/2018  . Essential hypertension 11/12/2018  . Allergic rhinitis due to pollen 11/12/2018  . Reactive airway disease 11/12/2018  . Breast pain, right 07/17/2018  . Prediabetes 07/17/2018  . Refused influenza vaccine 07/17/2018  . Chronic nonintractable headache 02/12/2018  . Dysuria 02/12/2018  . Localized edema 02/12/2018  . Hospital discharge follow-up 01/23/2018  . Bladder problem 01/05/2018  . Depression 01/05/2018  . Insomnia 01/05/2018  . Hepatitis C infection 12/01/2015  . RLS (restless legs syndrome) 10/23/2015  . Elevated liver enzymes 10/22/2015  . Recurrent major depression-severe ([place]) 04/17/2015  . GAD (generalized anxiety disorder) 04/15/2015  . Bipolar depression ([place]) 04/15/2015  . Severe recurrent major depression w/psychotic features, mood-congruent ([place])  04/14/2015  . Anxiety disorder 04/14/2015  . Major depressive disorder, recurrent severe without psychotic features ([place]) 04/14/2015  . Suicidal ideations   . Chronic LBP 01/02/2015  . Adult hypothyroidism 06/25/2012  . Cobalamin deficiency 06/25/2012  . Fatigue 06/25/2012  . Chronic arthralgias of knees and hips 06/25/2012  . Leukocytosis 06/25/2012  . Vitamin D deficiency 06/25/2012   Past Medical History:  Diagnosis Date  . ADHD (attention deficit hyperactivity disorder)   . Anemia    with pregnancy  . Anxiety   .  Arthritis   . Bipolar disorder ([place])   . Chronic back pain greater than 3 months duration   . Depression   . Dysuria    has to have self in and out cath   . Eroded bladder suspension mesh ([place])   . Hepatitis    C  . Hypothyroidism   . Leg pain, bilateral   . Rosacea   . Sciatic nerve pain   . Social anxiety disorder   . Thyroid disease     Family History  Problem Relation Age of Onset  . Schizophrenia Father   . Schizophrenia Cousin     Past Surgical History:  Procedure Laterality Date  . BACK SURGERY     total of 6 back surgeries  . bladder mesh  2009  . DILITATION & CURRETTAGE/HYSTROSCOPY WITH NOVASURE ABLATION N/A 08/19/2016   Procedure: DILATATION & CURETTAGE WITH NOVASURE ABLATION;  Surgeon: [name], MD;  Location: [facility];  Service: Gynecology;  Laterality: N/A;  . EUS N/A 09/09/2015   Procedure: UPPER ENDOSCOPIC ULTRASOUND (EUS) RADIAL;  Surgeon: [name], MD;  Location: [facility];  Service: Endoscopy;  Laterality: N/A;  . EYE SURGERY    . INCONTINENCE SURGERY    . SPINAL FUSION  2008   times two   Social History   Occupational History  . Not on file  Tobacco Use  . Smoking status: Former Smoker    Packs/day: 1.00    Types: Cigarettes    Quit date: 02/09/2016    Years since quitting: 3.7  . Smokeless tobacco: Never Used  . Tobacco comment: use vapor  Substance and Sexual Activity  . Alcohol use: No  . Drug use: No  . Sexual activity: Not on file

## 2019-10-30 ENCOUNTER — Other Ambulatory Visit: Payer: Self-pay | Admitting: Physician Assistant

## 2019-11-02 ENCOUNTER — Other Ambulatory Visit: Payer: Self-pay | Admitting: Family Medicine

## 2019-11-02 DIAGNOSIS — I10 Essential (primary) hypertension: Secondary | ICD-10-CM

## 2019-11-04 DIAGNOSIS — Z8616 Personal history of COVID-19: Secondary | ICD-10-CM

## 2019-11-04 HISTORY — DX: Personal history of COVID-19: Z86.16

## 2019-11-08 ENCOUNTER — Telehealth: Payer: Self-pay | Admitting: Family Medicine

## 2019-11-08 ENCOUNTER — Other Ambulatory Visit: Payer: Self-pay

## 2019-11-08 DIAGNOSIS — I10 Essential (primary) hypertension: Secondary | ICD-10-CM

## 2019-11-08 MED ORDER — LISINOPRIL 10 MG PO TABS: ORAL_TABLET | ORAL | 1 refills | Status: DC

## 2019-11-08 MED ORDER — LISINOPRIL 10 MG PO TABS: 10.0000 mg | ORAL_TABLET | Freq: Every day | ORAL | 3 refills | Status: DC

## 2019-11-08 NOTE — Telephone Encounter (Signed)
Patient is calling and stated that she haven't received her Lisinopril. Pt uses [facility] on [address]. CB is [phone]

## 2019-11-08 NOTE — Telephone Encounter (Signed)
Called pharmacy for clarification of medication being sent in for pt, they states that they do not have it

## 2019-11-08 NOTE — Telephone Encounter (Signed)
Patient aware.

## 2019-11-12 ENCOUNTER — Telehealth: Payer: Self-pay | Admitting: Family Medicine

## 2019-11-12 NOTE — Telephone Encounter (Signed)
LVM for patient to schedule Annual Wellness visit with nurse health advisor.

## 2019-12-09 DIAGNOSIS — F3132 Bipolar disorder, current episode depressed, moderate: Secondary | ICD-10-CM | POA: Diagnosis not present

## 2019-12-09 DIAGNOSIS — F411 Generalized anxiety disorder: Secondary | ICD-10-CM | POA: Diagnosis not present

## 2019-12-09 DIAGNOSIS — F429 Obsessive-compulsive disorder, unspecified: Secondary | ICD-10-CM | POA: Diagnosis not present

## 2019-12-20 ENCOUNTER — Other Ambulatory Visit: Payer: Self-pay | Admitting: Family Medicine

## 2019-12-20 ENCOUNTER — Telehealth: Payer: Self-pay | Admitting: Family Medicine

## 2019-12-20 DIAGNOSIS — M5430 Sciatica, unspecified side: Secondary | ICD-10-CM

## 2019-12-20 NOTE — Telephone Encounter (Signed)
Patient needs a refill of Gabapentin and stated she already called the pharmacy and they instructed her to call us.

## 2019-12-23 ENCOUNTER — Emergency Department (HOSPITAL_BASED_OUTPATIENT_CLINIC_OR_DEPARTMENT_OTHER)
Admission: EM | Admit: 2019-12-23 | Discharge: 2019-12-24 | Disposition: A | Discharge status: Home / Self Care | Payer: PPO | Attending: Emergency Medicine | Admitting: Emergency Medicine

## 2019-12-23 ENCOUNTER — Other Ambulatory Visit: Payer: Self-pay

## 2019-12-23 ENCOUNTER — Encounter (HOSPITAL_BASED_OUTPATIENT_CLINIC_OR_DEPARTMENT_OTHER): Payer: Self-pay | Admitting: Emergency Medicine

## 2019-12-23 DIAGNOSIS — R0789 Other chest pain: Secondary | ICD-10-CM | POA: Diagnosis not present

## 2019-12-23 DIAGNOSIS — R0602 Shortness of breath: Secondary | ICD-10-CM | POA: Diagnosis not present

## 2019-12-23 DIAGNOSIS — R05 Cough: Secondary | ICD-10-CM | POA: Insufficient documentation

## 2019-12-23 DIAGNOSIS — F17210 Nicotine dependence, cigarettes, uncomplicated: Secondary | ICD-10-CM | POA: Diagnosis not present

## 2019-12-23 DIAGNOSIS — J189 Pneumonia, unspecified organism: Secondary | ICD-10-CM | POA: Insufficient documentation

## 2019-12-23 HISTORY — DX: Obsessive-compulsive disorder, unspecified: F42.9

## 2019-12-23 MED ORDER — GABAPENTIN 300 MG PO CAPS: ORAL_CAPSULE | ORAL | 0 refills | Status: DC

## 2019-12-23 NOTE — ED Triage Notes (Signed)
Patient presents with complaints of shortness of breath and cough onset 3 days states worse tonight.

## 2019-12-23 NOTE — Telephone Encounter (Signed)
Spoke to pt told her Rx refill for Gabapentin was sent to pharmacy. Also need to schedule follow up appt. Pt verbalized understanding and will call back and schedule.

## 2019-12-24 ENCOUNTER — Emergency Department (HOSPITAL_BASED_OUTPATIENT_CLINIC_OR_DEPARTMENT_OTHER): Payer: PPO

## 2019-12-24 ENCOUNTER — Other Ambulatory Visit: Payer: Self-pay | Admitting: Family Medicine

## 2019-12-24 DIAGNOSIS — J189 Pneumonia, unspecified organism: Secondary | ICD-10-CM

## 2019-12-24 DIAGNOSIS — Z1231 Encounter for screening mammogram for malignant neoplasm of breast: Secondary | ICD-10-CM

## 2019-12-24 DIAGNOSIS — R05 Cough: Secondary | ICD-10-CM | POA: Diagnosis not present

## 2019-12-24 DIAGNOSIS — R0602 Shortness of breath: Secondary | ICD-10-CM | POA: Diagnosis not present

## 2019-12-24 HISTORY — DX: Pneumonia, unspecified organism: J18.9

## 2019-12-24 MED ORDER — ALBUTEROL SULFATE HFA 108 (90 BASE) MCG/ACT IN AERS
6.0000 | INHALATION_SPRAY | Freq: Once | RESPIRATORY_TRACT | Status: AC
  Administered 2019-12-24: 6 via RESPIRATORY_TRACT
  Filled 2019-12-24: qty 6.7

## 2019-12-24 MED ORDER — DEXAMETHASONE SODIUM PHOSPHATE 10 MG/ML IJ SOLN
10.0000 mg | Freq: Once | INTRAMUSCULAR | Status: AC
  Administered 2019-12-24: 10 mg via INTRAVENOUS
  Filled 2019-12-24: qty 1

## 2019-12-24 MED ORDER — DOXYCYCLINE HYCLATE 100 MG PO CAPS: 100.0000 mg | ORAL_CAPSULE | Freq: Two times a day (BID) | ORAL | 0 refills | Status: DC

## 2019-12-24 MED ORDER — ALBUTEROL SULFATE HFA 108 (90 BASE) MCG/ACT IN AERS: 2.0000 | INHALATION_SPRAY | RESPIRATORY_TRACT | 0 refills | Status: DC | PRN

## 2019-12-24 NOTE — Discharge Instructions (Addendum)
You were seen today for a cough and shortness of breath.  He is you likely have a pneumonia.  Take antibiotics as prescribed.  Continue inhaler for cough.  You were also given a dose of steroids.  If any of your symptoms worsen, you should be reevaluated.

## 2019-12-24 NOTE — ED Notes (Signed)
Patient denies pain and is resting comfortably.  

## 2019-12-24 NOTE — ED Provider Notes (Signed)
[facility] EMERGENCY DEPARTMENT Provider Note   CSN: [id number] Arrival date & time: 12/23/19  2339     History Chief Complaint  Patient presents with  . Shortness of Breath    [name] is a 49 y.o. female.  HPI     This is a 49 year old female with a history of anxiety, smoking who presents with cough and shortness of breath.  Patient reports 3-day history of worsening shortness of breath and cough.  Cough is nonproductive.  She states the cough is worse at night.  She has been using an inhaler with minimal relief.  No history of asthma or COPD but she is a current smoker.  She is not had any fevers.  No known sick contacts or Covid exposures.  She reports chest and abdominal discomfort only with coughing.  Denies nausea, vomiting, diarrhea.  Past Medical History:  Diagnosis Date  . ADHD (attention deficit hyperactivity disorder)   . Anemia    with pregnancy  . Anxiety   . Arthritis   . Bipolar disorder ([place])   . Chronic back pain greater than 3 months duration   . Depression   . Dysuria    has to have self in and out cath   . Eroded bladder suspension mesh ([place])   . Hepatitis    C  . Hypothyroidism   . Leg pain, bilateral   . Obsessive-compulsive disorder   . Rosacea   . Sciatic nerve pain   . Social anxiety disorder   . Thyroid disease     Patient Active Problem List   Diagnosis Date Noted  . COVID-19 07/09/2019  . Left hip pain 04/30/2019  . Otitis externa of left ear 04/30/2019  . Cholesteatoma of left ear 04/30/2019  . Dysfunction of left eustachian tube 04/30/2019  . Left ear pain 04/30/2019  . Sciatica 04/19/2019  . Class 3 severe obesity due to excess calories with serious comorbidity and body mass index (BMI) of 45.0 to 49.9 in adult ([place]) 04/19/2019  . Post-nasal drip 01/10/2019  . Allergic cough 01/10/2019  . Obstructive sleep apnea syndrome 12/11/2018  . Acute frontal sinusitis 12/10/2018  . Primary osteoarthritis of left hip  11/13/2018  . Body mass index 45.0-49.9, adult ([place]) 11/13/2018  . Morbid obesity ([place]) 11/13/2018  . Elevated cholesterol 11/12/2018  . Essential hypertension 11/12/2018  . Allergic rhinitis due to pollen 11/12/2018  . Reactive airway disease 11/12/2018  . Breast pain, right 07/17/2018  . Prediabetes 07/17/2018  . Refused influenza vaccine 07/17/2018  . Chronic nonintractable headache 02/12/2018  . Dysuria 02/12/2018  . Localized edema 02/12/2018  . Hospital discharge follow-up 01/23/2018  . Bladder problem 01/05/2018  . Depression 01/05/2018  . Insomnia 01/05/2018  . Hepatitis C infection 12/01/2015  . RLS (restless legs syndrome) 10/23/2015  . Elevated liver enzymes 10/22/2015  . Recurrent major depression-severe ([place]) 04/17/2015  . GAD (generalized anxiety disorder) 04/15/2015  . Bipolar depression ([place]) 04/15/2015  . Severe recurrent major depression w/psychotic features, mood-congruent ([place]) 04/14/2015  . Anxiety disorder 04/14/2015  . Major depressive disorder, recurrent severe without psychotic features ([place]) 04/14/2015  . Suicidal ideations   . Chronic LBP 01/02/2015  . Adult hypothyroidism 06/25/2012  . Cobalamin deficiency 06/25/2012  . Fatigue 06/25/2012  . Chronic arthralgias of knees and hips 06/25/2012  . Leukocytosis 06/25/2012  . Vitamin D deficiency 06/25/2012    Past Surgical History:  Procedure Laterality Date  . BACK SURGERY     total of 6 back  surgeries  . bladder mesh  2009  . DILITATION & CURRETTAGE/HYSTROSCOPY WITH NOVASURE ABLATION N/A 08/19/2016   Procedure: DILATATION & CURETTAGE WITH NOVASURE ABLATION;  Surgeon: [name], MD;  Location: [facility];  Service: Gynecology;  Laterality: N/A;  . EUS N/A 09/09/2015   Procedure: UPPER ENDOSCOPIC ULTRASOUND (EUS) RADIAL;  Surgeon: [name], MD;  Location: [facility];  Service: Endoscopy;  Laterality: N/A;  . EYE SURGERY    . INCONTINENCE SURGERY    . SPINAL FUSION  2008   times two      OB History   No obstetric history on file.     Family History  Problem Relation Age of Onset  . Schizophrenia Father   . Schizophrenia Cousin     Social History   Tobacco Use  . Smoking status: Former Smoker    Packs/day: 1.00    Types: Cigarettes    Quit date: 02/09/2016    Years since quitting: 3.8  . Smokeless tobacco: Never Used  . Tobacco comment: use vapor  Substance Use Topics  . Alcohol use: No  . Drug use: No    Home Medications Prior to Admission medications   Medication Sig Start Date End Date Taking? Authorizing Provider  albuterol (VENTOLIN HFA) 108 (90 Base) MCG/ACT inhaler Inhale 2 puffs into the lungs every 4 (four) hours as needed for wheezing or shortness of breath. 12/24/19   Brax Walen, Barbette Hair, MD  ALPRAZolam Duanne Moron) 0.5 MG tablet Take 0.5 mg by mouth 2 (two) times daily as needed. 12/20/19   [provider]  ARIPiprazole (ABILIFY) 5 MG tablet Take 1 tablet (5 mg total) by mouth daily. 09/17/15   Clarene Reamer, MD  atorvastatin (LIPITOR) 20 MG tablet Take 1 tablet (20 mg total) by mouth daily. 08/22/19   Libby Maw, MD  clomiPRAMINE (ANAFRANIL) 75 MG capsule Take 75 mg by mouth at bedtime.    [provider]  doxycycline (VIBRAMYCIN) 100 MG capsule Take 1 capsule (100 mg total) by mouth 2 (two) times daily. 12/24/19   Tabbatha Bordelon, Barbette Hair, MD  gabapentin (NEURONTIN) 300 MG capsule TAKE 1 CAPSULE(300 MG) BY MOUTH THREE TIMES DAILY AS NEEDED 12/23/19   Libby Maw, MD  lamoTRIgine (LAMICTAL) 100 MG tablet Take 1 tablet (100 mg total) by mouth daily. Patient taking differently: Take 150 mg by mouth daily.  09/17/15   Clarene Reamer, MD  levothyroxine (SYNTHROID, LEVOTHROID) 75 MCG tablet Take 1 tablet (75 mcg total) by mouth daily. 11/13/18   Libby Maw, MD  lisinopril (ZESTRIL) 10 MG tablet TAKE 1 TABLET(10 MG) BY MOUTH DAILY 11/08/19   Libby Maw, MD  methylphenidate (RITALIN) 10 MG tablet Take  1 tablet by mouth 2 (two) times daily. 08/26/18   [provider]  nabumetone (RELAFEN) 750 MG tablet Take 1 tablet (750 mg total) by mouth 2 (two) times daily as needed. 05/28/19   Hilts, Legrand Como, MD    Allergies    Pregabalin, Celebrex [celecoxib], Effexor [venlafaxine hcl], Mirapex [pramipexole], Pristiq [desvenlafaxine], Prozac [fluoxetine hcl], Symbyax [olanzapine-fluoxetine hcl], and Wellbutrin [bupropion hcl]  Review of Systems   Review of Systems  Constitutional: Negative for fever.  Respiratory: Positive for cough, shortness of breath and wheezing.   Cardiovascular: Positive for chest pain.  Gastrointestinal: Positive for abdominal pain. Negative for diarrhea, nausea and vomiting.  Genitourinary: Negative for dysuria.  All other systems reviewed and are negative.   Physical Exam Updated Vital Signs BP (!) 144/69   Pulse Marland Kitchen)  108   Temp 99 F (37.2 C) (Oral)   Resp 20   Ht 1.626 m (5\' 4" )   Wt 127 kg   LMP 11/20/2019   SpO2 99%   BMI 48.06 kg/m   Physical Exam Vitals and nursing note reviewed.  Constitutional:      Appearance: She is well-developed. She is obese. She is not ill-appearing.  HENT:     Head: Normocephalic and atraumatic.     Mouth/Throat:     Mouth: Mucous membranes are moist.  Eyes:     Pupils: Pupils are equal, round, and reactive to light.  Cardiovascular:     Rate and Rhythm: Normal rate and regular rhythm.     Heart sounds: Normal heart sounds.  Pulmonary:     Effort: Pulmonary effort is normal. No respiratory distress.     Breath sounds: Wheezing present.     Comments: Fair air movement, end expiratory wheezing in all lung fields, no respiratory distress Abdominal:     General: Bowel sounds are normal.     Palpations: Abdomen is soft.     Tenderness: There is no guarding or rebound.  Musculoskeletal:     Cervical back: Neck supple.     Right lower leg: No tenderness. No edema.     Left lower leg: No tenderness. No edema.   Skin:    General: Skin is warm and dry.  Neurological:     Mental Status: She is alert and oriented to person, place, and time.  Psychiatric:        Mood and Affect: Mood normal.     ED Results / Procedures / Treatments   Labs (all labs ordered are listed, but only abnormal results are displayed) Labs Reviewed - No data to display  EKG None  Radiology DG Chest Portable 1 View  Result Date: 12/24/2019 CLINICAL DATA:  Shortness of breath, cough EXAM: PORTABLE CHEST 1 VIEW COMPARISON:  08/13/2018 FINDINGS: Heart is normal size. Concern for airspace opacity at the left base and retrocardiac region. Right lung clear. No effusions or acute bony abnormality. IMPRESSION: Left lower lobe opacity concerning for pneumonia. Electronically Signed   By: Rolm Baptise M.D.   On: 12/24/2019 01:32    Procedures Procedures (including critical care time)  Medications Ordered in ED Medications  albuterol (VENTOLIN HFA) 108 (90 Base) MCG/ACT inhaler 6 puff (6 puffs Inhalation Given 12/24/19 0103)  dexamethasone (DECADRON) injection 10 mg (10 mg Intravenous Given 12/24/19 0145)    ED Course  I have reviewed the triage vital signs and the nursing notes.  Pertinent labs & imaging results that were available during my care of the patient were reviewed by me and considered in my medical decision making (see chart for details).    MDM Rules/Calculators/A&P                       Patient presents with cough and shortness of breath.  She is overall nontoxic and vital signs are largely reassuring.  She is in no significant respiratory distress.  She is wheezing on exam.  Patient was given 6 puffs of albuterol.  Additionally she was given Decadron given her history of smoking and likely bronchospasm.  Chest x-ray obtained and shows a left lower lobe infiltrate likely representing pneumonia.  Patient is vaccinated against COVID-19 and chest x-ray is much more characteristic as a lobular pneumonia.  Will  elect to treat with doxycycline.  Patient ambulated and maintain her pulse ox greater  than 94% with ambulation.  Recommend continuing albuterol and treatment for community-acquired pneumonia.  Do not feel she needs further diagnostic work-up as she is otherwise clinically stable  After history, exam, and medical workup I feel the patient has been appropriately medically screened and is safe for discharge home. Pertinent diagnoses were discussed with the patient. Patient was given return precautions.   Final Clinical Impression(s) / ED Diagnoses Final diagnoses:  Community acquired pneumonia of left lower lobe of lung    Rx / DC Orders ED Discharge Orders         Ordered    doxycycline (VIBRAMYCIN) 100 MG capsule  2 times daily     12/24/19 0304    albuterol (VENTOLIN HFA) 108 (90 Base) MCG/ACT inhaler  Every 4 hours PRN     12/24/19 0304           Merryl Hacker, MD 12/24/19 [phone]

## 2019-12-24 NOTE — ED Notes (Signed)
Pt ambulated w/ a steady gait around nursing station and to bathroom.  O2 sat stayed between 94%-98% on RA.  One instance of 77% noted w bad waveform noted.  Pt denied dyspnea or dizziness.

## 2019-12-26 ENCOUNTER — Other Ambulatory Visit: Payer: Self-pay

## 2019-12-26 ENCOUNTER — Encounter (HOSPITAL_BASED_OUTPATIENT_CLINIC_OR_DEPARTMENT_OTHER): Payer: Self-pay | Admitting: Emergency Medicine

## 2019-12-26 ENCOUNTER — Emergency Department (HOSPITAL_BASED_OUTPATIENT_CLINIC_OR_DEPARTMENT_OTHER)
Admission: EM | Admit: 2019-12-26 | Discharge: 2019-12-27 | Disposition: A | Discharge status: Home / Self Care | Payer: PPO | Attending: Emergency Medicine | Admitting: Emergency Medicine

## 2019-12-26 ENCOUNTER — Emergency Department (HOSPITAL_BASED_OUTPATIENT_CLINIC_OR_DEPARTMENT_OTHER): Payer: PPO

## 2019-12-26 DIAGNOSIS — Z79899 Other long term (current) drug therapy: Secondary | ICD-10-CM | POA: Diagnosis not present

## 2019-12-26 DIAGNOSIS — Z87891 Personal history of nicotine dependence: Secondary | ICD-10-CM | POA: Diagnosis not present

## 2019-12-26 DIAGNOSIS — Z20822 Contact with and (suspected) exposure to covid-19: Secondary | ICD-10-CM | POA: Diagnosis not present

## 2019-12-26 DIAGNOSIS — E039 Hypothyroidism, unspecified: Secondary | ICD-10-CM | POA: Diagnosis not present

## 2019-12-26 DIAGNOSIS — J189 Pneumonia, unspecified organism: Secondary | ICD-10-CM | POA: Insufficient documentation

## 2019-12-26 DIAGNOSIS — R000 Tachycardia, unspecified: Secondary | ICD-10-CM | POA: Diagnosis not present

## 2019-12-26 DIAGNOSIS — J45901 Unspecified asthma with (acute) exacerbation: Secondary | ICD-10-CM

## 2019-12-26 DIAGNOSIS — R0602 Shortness of breath: Secondary | ICD-10-CM | POA: Diagnosis not present

## 2019-12-26 DIAGNOSIS — R05 Cough: Secondary | ICD-10-CM | POA: Diagnosis not present

## 2019-12-26 LAB — CBC WITH DIFFERENTIAL/PLATELET
Abs Immature Granulocytes: 0.39 10*3/uL — ABNORMAL HIGH (ref 0.00–0.07)
Basophils Absolute: 0.1 10*3/uL (ref 0.0–0.1)
Basophils Relative: 1 %
Eosinophils Absolute: 0.5 10*3/uL (ref 0.0–0.5)
Eosinophils Relative: 3 %
HCT: 36.8 % (ref 36.0–46.0)
Hemoglobin: 11.9 g/dL — ABNORMAL LOW (ref 12.0–15.0)
Immature Granulocytes: 2 %
Lymphocytes Relative: 26 %
Lymphs Abs: 4.4 10*3/uL — ABNORMAL HIGH (ref 0.7–4.0)
MCH: 27.3 pg (ref 26.0–34.0)
MCHC: 32.3 g/dL (ref 30.0–36.0)
MCV: 84.4 fL (ref 80.0–100.0)
Monocytes Absolute: 1 10*3/uL (ref 0.1–1.0)
Monocytes Relative: 6 %
Neutro Abs: 10.8 10*3/uL — ABNORMAL HIGH (ref 1.7–7.7)
Neutrophils Relative %: 62 %
Platelets: 305 10*3/uL (ref 150–400)
RBC: 4.36 MIL/uL (ref 3.87–5.11)
RDW: 17.6 % — ABNORMAL HIGH (ref 11.5–15.5)
WBC: 17.1 10*3/uL — ABNORMAL HIGH (ref 4.0–10.5)
nRBC: 0 % (ref 0.0–0.2)

## 2019-12-26 LAB — COMPREHENSIVE METABOLIC PANEL
ALT: 45 U/L — ABNORMAL HIGH (ref 0–44)
AST: 35 U/L (ref 15–41)
Albumin: 3.6 g/dL (ref 3.5–5.0)
Alkaline Phosphatase: 83 U/L (ref 38–126)
Anion gap: 9 (ref 5–15)
BUN: 8 mg/dL (ref 6–20)
CO2: 24 mmol/L (ref 22–32)
Calcium: 8.8 mg/dL — ABNORMAL LOW (ref 8.9–10.3)
Chloride: 98 mmol/L (ref 98–111)
Creatinine, Ser: 0.7 mg/dL (ref 0.44–1.00)
GFR calc Af Amer: >60 mL/min (ref >60)
GFR calc non Af Amer: >60 mL/min (ref >60)
Glucose, Bld: 174 mg/dL — ABNORMAL HIGH (ref 70–99)
Potassium: 3.6 mmol/L (ref 3.5–5.1)
Sodium: 131 mmol/L — ABNORMAL LOW (ref 135–145)
Total Bilirubin: 0.3 mg/dL (ref 0.3–1.2)
Total Protein: 7.1 g/dL (ref 6.5–8.1)

## 2019-12-26 LAB — RESPIRATORY PANEL BY RT PCR (FLU A&B, COVID)
Influenza A by PCR: NEGATIVE
Influenza B by PCR: NEGATIVE
SARS Coronavirus 2 by RT PCR: NEGATIVE

## 2019-12-26 LAB — LACTIC ACID, PLASMA: Lactic Acid, Venous: 2 mmol/L (ref 0.5–1.9)

## 2019-12-26 LAB — URINALYSIS, ROUTINE W REFLEX MICROSCOPIC
Bilirubin Urine: NEGATIVE
Glucose, UA: NEGATIVE mg/dL
Hgb urine dipstick: NEGATIVE
Ketones, ur: NEGATIVE mg/dL
Leukocytes,Ua: NEGATIVE
Nitrite: NEGATIVE
Protein, ur: NEGATIVE mg/dL
Specific Gravity, Urine: 1.01 (ref 1.005–1.030)
pH: 6.5 (ref 5.0–8.0)

## 2019-12-26 MED ORDER — PREDNISONE 20 MG PO TABS: ORAL_TABLET | ORAL | 0 refills | Status: DC

## 2019-12-26 MED ORDER — SODIUM CHLORIDE 0.9 % IV BOLUS
1000.0000 mL | Freq: Once | INTRAVENOUS | Status: AC
  Administered 2019-12-26: 1000 mL via INTRAVENOUS

## 2019-12-26 MED ORDER — CEPHALEXIN 500 MG PO CAPS: 500.0000 mg | ORAL_CAPSULE | Freq: Three times a day (TID) | ORAL | 0 refills | Status: DC

## 2019-12-26 MED ORDER — IOHEXOL 350 MG/ML SOLN
100.0000 mL | Freq: Once | INTRAVENOUS | Status: AC | PRN
  Administered 2019-12-26: 100 mL via INTRAVENOUS

## 2019-12-26 MED ORDER — MAGNESIUM SULFATE 2 GM/50ML IV SOLN
2.0000 g | Freq: Once | INTRAVENOUS | Status: AC
  Administered 2019-12-26: 2 g via INTRAVENOUS
  Filled 2019-12-26: qty 50

## 2019-12-26 MED ORDER — AZITHROMYCIN 250 MG PO TABS: 250.0000 mg | ORAL_TABLET | Freq: Every day | ORAL | 0 refills | Status: DC

## 2019-12-26 MED ORDER — ALBUTEROL SULFATE HFA 108 (90 BASE) MCG/ACT IN AERS
2.0000 | INHALATION_SPRAY | Freq: Once | RESPIRATORY_TRACT | Status: AC
  Administered 2019-12-26: 2 via RESPIRATORY_TRACT
  Filled 2019-12-26: qty 6.7

## 2019-12-26 MED ORDER — HYDROCOD POLST-CPM POLST ER 10-8 MG/5ML PO SUER: 5.0000 mL | Freq: Two times a day (BID) | ORAL | 0 refills | Status: DC | PRN

## 2019-12-26 MED ORDER — HYDROCOD POLST-CPM POLST ER 10-8 MG/5ML PO SUER
5.0000 mL | Freq: Once | ORAL | Status: AC
  Administered 2019-12-26: 5 mL via ORAL
  Filled 2019-12-26: qty 5

## 2019-12-26 MED ORDER — SODIUM CHLORIDE 0.9 % IV SOLN
500.0000 mg | Freq: Once | INTRAVENOUS | Status: AC
  Administered 2019-12-26: 500 mg via INTRAVENOUS
  Filled 2019-12-26: qty 500

## 2019-12-26 MED ORDER — METHYLPREDNISOLONE SODIUM SUCC 125 MG IJ SOLR
125.0000 mg | Freq: Once | INTRAMUSCULAR | Status: AC
  Administered 2019-12-26: 125 mg via INTRAVENOUS
  Filled 2019-12-26: qty 2

## 2019-12-26 MED ORDER — SODIUM CHLORIDE 0.9 % IV SOLN
1.0000 g | Freq: Once | INTRAVENOUS | Status: AC
  Administered 2019-12-26: 1 g via INTRAVENOUS
  Filled 2019-12-26: qty 10

## 2019-12-26 MED ORDER — GUAIFENESIN 100 MG/5ML PO SYRP: 100.0000 mg | ORAL_SOLUTION | ORAL | 0 refills | Status: DC | PRN

## 2019-12-26 NOTE — ED Triage Notes (Addendum)
Pt was seen on 4/19 and dx with pneumonia  Given rx  And she has been taking them , antiobiotic , states she has cont to cough and not feel well  States was dx with pneum, states having bilateral rib pain from coughing

## 2019-12-26 NOTE — Discharge Instructions (Addendum)
You have pneumonia and asthma.  Stop taking doxycycline.  Please take Keflex and azithromycin instead.  Take Tussionex as prescribed.  Take prednisone as prescribed as well.  See your doctor in 2 to 3 days.  Return to ER if you have worse shortness of breath, cough, fever, trouble breathing.

## 2019-12-26 NOTE — ED Notes (Signed)
Pt to CT scan.

## 2019-12-26 NOTE — ED Provider Notes (Signed)
[facility] EMERGENCY DEPARTMENT Provider Note   CSN: [id number] Arrival date & time: 12/26/19  2025     History Chief Complaint  Patient presents with  . Cough  . Shortness of Breath    [name] is a 49 y.o. female history of bipolar, depression, asthma here presenting with shortness of breath, cough, chills.  States that she started coughing about a week ago.  She came to the ED 3 days ago and had a chest x-ray that showed pneumonia.  Patient was thought to have asthma versus pneumonia.  Patient was discharged home with doxycycline.  Patient states that she has persistent cough and low-grade temperature.  Patient states that she just does not feel well also came here for further evaluation.  Patient did have Covid vaccine 2 weeks ago so no Covid was tested 3 days ago.  The history is provided by the patient.       Past Medical History:  Diagnosis Date  . ADHD (attention deficit hyperactivity disorder)   . Anemia    with pregnancy  . Anxiety   . Arthritis   . Bipolar disorder ([place])   . Chronic back pain greater than 3 months duration   . Depression   . Dysuria    has to have self in and out cath   . Eroded bladder suspension mesh ([place])   . Hepatitis    C  . Hypothyroidism   . Leg pain, bilateral   . Obsessive-compulsive disorder   . Rosacea   . Sciatic nerve pain   . Social anxiety disorder   . Thyroid disease     Patient Active Problem List   Diagnosis Date Noted  . COVID-19 07/09/2019  . Left hip pain 04/30/2019  . Otitis externa of left ear 04/30/2019  . Cholesteatoma of left ear 04/30/2019  . Dysfunction of left eustachian tube 04/30/2019  . Left ear pain 04/30/2019  . Sciatica 04/19/2019  . Class 3 severe obesity due to excess calories with serious comorbidity and body mass index (BMI) of 45.0 to 49.9 in adult ([place]) 04/19/2019  . Post-nasal drip 01/10/2019  . Allergic cough 01/10/2019  . Obstructive sleep apnea syndrome 12/11/2018  .  Acute frontal sinusitis 12/10/2018  . Primary osteoarthritis of left hip 11/13/2018  . Body mass index 45.0-49.9, adult ([place]) 11/13/2018  . Morbid obesity ([place]) 11/13/2018  . Elevated cholesterol 11/12/2018  . Essential hypertension 11/12/2018  . Allergic rhinitis due to pollen 11/12/2018  . Reactive airway disease 11/12/2018  . Breast pain, right 07/17/2018  . Prediabetes 07/17/2018  . Refused influenza vaccine 07/17/2018  . Chronic nonintractable headache 02/12/2018  . Dysuria 02/12/2018  . Localized edema 02/12/2018  . Hospital discharge follow-up 01/23/2018  . Bladder problem 01/05/2018  . Depression 01/05/2018  . Insomnia 01/05/2018  . Hepatitis C infection 12/01/2015  . RLS (restless legs syndrome) 10/23/2015  . Elevated liver enzymes 10/22/2015  . Recurrent major depression-severe ([place]) 04/17/2015  . GAD (generalized anxiety disorder) 04/15/2015  . Bipolar depression ([place]) 04/15/2015  . Severe recurrent major depression w/psychotic features, mood-congruent ([place]) 04/14/2015  . Anxiety disorder 04/14/2015  . Major depressive disorder, recurrent severe without psychotic features ([place]) 04/14/2015  . Suicidal ideations   . Chronic LBP 01/02/2015  . Adult hypothyroidism 06/25/2012  . Cobalamin deficiency 06/25/2012  . Fatigue 06/25/2012  . Chronic arthralgias of knees and hips 06/25/2012  . Leukocytosis 06/25/2012  . Vitamin D deficiency 06/25/2012    Past Surgical History:  Procedure Laterality  Date  . BACK SURGERY     total of 6 back surgeries  . bladder mesh  2009  . DILITATION & CURRETTAGE/HYSTROSCOPY WITH NOVASURE ABLATION N/A 08/19/2016   Procedure: DILATATION & CURETTAGE WITH NOVASURE ABLATION;  Surgeon: [name], MD;  Location: [facility];  Service: Gynecology;  Laterality: N/A;  . EUS N/A 09/09/2015   Procedure: UPPER ENDOSCOPIC ULTRASOUND (EUS) RADIAL;  Surgeon: [name], MD;  Location: [facility];  Service: Endoscopy;  Laterality: N/A;  . EYE SURGERY     . INCONTINENCE SURGERY    . SPINAL FUSION  2008   times two     OB History   No obstetric history on file.     Family History  Problem Relation Age of Onset  . Schizophrenia Father   . Schizophrenia Cousin     Social History   Tobacco Use  . Smoking status: Former Smoker    Packs/day: 1.00    Types: Cigarettes    Quit date: 02/09/2016    Years since quitting: 3.8  . Smokeless tobacco: Never Used  . Tobacco comment: use vapor  Substance Use Topics  . Alcohol use: No  . Drug use: No    Home Medications Prior to Admission medications   Medication Sig Start Date End Date Taking? Authorizing Provider  albuterol (VENTOLIN HFA) 108 (90 Base) MCG/ACT inhaler Inhale 2 puffs into the lungs every 4 (four) hours as needed for wheezing or shortness of breath. 12/24/19   Horton, Barbette Hair, MD  ALPRAZolam Duanne Moron) 0.5 MG tablet Take 0.5 mg by mouth 2 (two) times daily as needed. 12/20/19   [provider]  ARIPiprazole (ABILIFY) 5 MG tablet Take 1 tablet (5 mg total) by mouth daily. 09/17/15   Clarene Reamer, MD  atorvastatin (LIPITOR) 20 MG tablet Take 1 tablet (20 mg total) by mouth daily. 08/22/19   Libby Maw, MD  clomiPRAMINE (ANAFRANIL) 75 MG capsule Take 75 mg by mouth at bedtime.    [provider]  doxycycline (VIBRAMYCIN) 100 MG capsule Take 1 capsule (100 mg total) by mouth 2 (two) times daily. 12/24/19   Horton, Barbette Hair, MD  gabapentin (NEURONTIN) 300 MG capsule TAKE 1 CAPSULE(300 MG) BY MOUTH THREE TIMES DAILY AS NEEDED 12/23/19   Libby Maw, MD  lamoTRIgine (LAMICTAL) 100 MG tablet Take 1 tablet (100 mg total) by mouth daily. Patient taking differently: Take 150 mg by mouth daily.  09/17/15   Clarene Reamer, MD  levothyroxine (SYNTHROID, LEVOTHROID) 75 MCG tablet Take 1 tablet (75 mcg total) by mouth daily. 11/13/18   Libby Maw, MD  lisinopril (ZESTRIL) 10 MG tablet TAKE 1 TABLET(10 MG) BY MOUTH DAILY 11/08/19   Libby Maw, MD  methylphenidate (RITALIN) 10 MG tablet Take 1 tablet by mouth 2 (two) times daily. 08/26/18   [provider]  nabumetone (RELAFEN) 750 MG tablet Take 1 tablet (750 mg total) by mouth 2 (two) times daily as needed. 05/28/19   Hilts, Legrand Como, MD    Allergies    Pregabalin, Celebrex [celecoxib], Effexor [venlafaxine hcl], Mirapex [pramipexole], Pristiq [desvenlafaxine], Prozac [fluoxetine hcl], Symbyax [olanzapine-fluoxetine hcl], and Wellbutrin [bupropion hcl]  Review of Systems   Review of Systems  Respiratory: Positive for cough and shortness of breath.   All other systems reviewed and are negative.   Physical Exam Updated Vital Signs BP 131/72   Pulse (!) 103   Temp 99.3 F (37.4 C) (Oral)   Resp (!) 22   SpO2  96%   Physical Exam Vitals and nursing note reviewed.  Constitutional:      Appearance: She is well-developed.  HENT:     Head: Normocephalic.     Mouth/Throat:     Mouth: Mucous membranes are moist.  Eyes:     Pupils: Pupils are equal, round, and reactive to light.  Cardiovascular:     Rate and Rhythm: Normal rate and regular rhythm.  Pulmonary:     Comments: Tachypneic, diffuse wheezing, crackles L base, no retractions  Abdominal:     General: Bowel sounds are normal.     Palpations: Abdomen is soft.  Musculoskeletal:        General: Normal range of motion.     Cervical back: Normal range of motion.     Right lower leg: No tenderness. No edema.     Left lower leg: No tenderness. No edema.  Skin:    General: Skin is warm.     Capillary Refill: Capillary refill takes less than 2 seconds.  Neurological:     General: No focal deficit present.     Mental Status: She is alert and oriented to person, place, and time.  Psychiatric:        Mood and Affect: Mood normal.        Behavior: Behavior normal.     ED Results / Procedures / Treatments   Labs (all labs ordered are listed, but only abnormal results are displayed) Labs  Reviewed  CBC WITH DIFFERENTIAL/PLATELET - Abnormal; Notable for the following components:      Result Value   WBC 17.1 (*)    Hemoglobin 11.9 (*)    RDW 17.6 (*)    Neutro Abs 10.8 (*)    Lymphs Abs 4.4 (*)    Abs Immature Granulocytes 0.39 (*)    All other components within normal limits  COMPREHENSIVE METABOLIC PANEL - Abnormal; Notable for the following components:   Sodium 131 (*)    Glucose, Bld 174 (*)    Calcium 8.8 (*)    ALT 45 (*)    All other components within normal limits  LACTIC ACID, PLASMA - Abnormal; Notable for the following components:   Lactic Acid, Venous 2.0 (*)    All other components within normal limits  RESPIRATORY PANEL BY RT PCR (FLU A&B, COVID)  CULTURE, BLOOD (ROUTINE X 2)  CULTURE, BLOOD (ROUTINE X 2)  URINALYSIS, ROUTINE W REFLEX MICROSCOPIC  LACTIC ACID, PLASMA    EKG EKG Interpretation  Date/Time:  Thursday December 26 2019 20:33:16 EDT Ventricular Rate:  102 PR Interval:  140 QRS Duration: 74 QT Interval:  326 QTC Calculation: 424 R Axis:   73 Text Interpretation: Sinus tachycardia Low voltage QRS Borderline ECG No significant change since last tracing Confirmed by Wandra Arthurs [phone]) on 12/26/2019 10:26:07 PM   Radiology DG Chest 2 View  Result Date: 12/26/2019 CLINICAL DATA:  49 year old female with cough. EXAM: CHEST - 2 VIEW COMPARISON:  Chest radiograph dated 12/24/2019. FINDINGS: Mild diffuse interstitial bilateral prominence and faint hazy densities throughout the lungs which may represent atelectasis or developing infiltrate. Clinical correlation is recommended. There is no pleural effusion or pneumothorax. The cardiac silhouette is within normal limits. No acute osseous pathology. IMPRESSION: Findings may represent developing infiltrate or atelectasis. Electronically Signed   By: Anner Crete M.D.   On: 12/26/2019 20:53   CT Angio Chest PE W and/or Wo Contrast  Result Date: 12/26/2019 CLINICAL DATA:  Shortness of breath,  pneumonia, cough EXAM:  CT ANGIOGRAPHY CHEST WITH CONTRAST TECHNIQUE: Multidetector CT imaging of the chest was performed using the standard protocol during bolus administration of intravenous contrast. Multiplanar CT image reconstructions and MIPs were obtained to evaluate the vascular anatomy. CONTRAST:  151mL OMNIPAQUE IOHEXOL 350 MG/ML SOLN COMPARISON:  12/26/2019 FINDINGS: Cardiovascular: This is a technically adequate evaluation of the pulmonary vasculature. No filling defects or pulmonary emboli. The heart is unremarkable without pericardial effusion. Thoracic aorta is normal in caliber without dissection. Mediastinum/Nodes: Borderline enlarged mediastinal lymph nodes are seen. Largest in the right paratracheal region measures 10 mm in short axis. These are likely reactive. The esophagus, trachea, and thyroid are unremarkable. Lungs/Pleura: There are multifocal bilateral ground-glass opacities consistent with pneumonia. No effusion or pneumothorax. Central airways are patent. Upper Abdomen: Hepatic steatosis. No acute upper abdominal findings. Musculoskeletal: No acute or destructive bony lesions. Reconstructed images demonstrate no additional findings. Review of the MIP images confirms the above findings. IMPRESSION: 1. No CT evidence of pulmonary embolism. 2. Multifocal bilateral ground-glass opacities consistent with pneumonia. Pattern is commonly seen with COVID-19. 3. Borderline enlarged mediastinal lymph nodes are likely reactive. Electronically Signed   By: Randa Ngo M.D.   On: 12/26/2019 23:11    Procedures Procedures (including critical care time)  Medications Ordered in ED Medications  sodium chloride 0.9 % bolus 1,000 mL (has no administration in time range)  cefTRIAXone (ROCEPHIN) 1 g in sodium chloride 0.9 % 100 mL IVPB (1 g Intravenous New Bag/Given 12/26/19 2300)  azithromycin (ZITHROMAX) 500 mg in sodium chloride 0.9 % 250 mL IVPB (has no administration in time range)    methylPREDNISolone sodium succinate (SOLU-MEDROL) 125 mg/2 mL injection 125 mg (125 mg Intravenous Given 12/26/19 2200)  albuterol (VENTOLIN HFA) 108 (90 Base) MCG/ACT inhaler 2 puff (2 puffs Inhalation Given 12/26/19 2136)  sodium chloride 0.9 % bolus 1,000 mL (1,000 mLs Intravenous New Bag/Given 12/26/19 2200)  magnesium sulfate IVPB 2 g 50 mL ( Intravenous Stopped 12/26/19 2208)  iohexol (OMNIPAQUE) 350 MG/ML injection 100 mL (100 mLs Intravenous Contrast Given 12/26/19 2242)  chlorpheniramine-HYDROcodone (TUSSIONEX) 10-8 MG/5ML suspension 5 mL (5 mLs Oral Given 12/26/19 2258)    ED Course  I have reviewed the triage vital signs and the nursing notes.  Pertinent labs & imaging results that were available during my care of the patient were reviewed by me and considered in my medical decision making (see chart for details).    MDM Rules/Calculators/A&P                      [name] is a 49 y.o. female here presenting with shortness of breath and cough. She is on steroids and doxycycline already for pneumonia and asthma.  She did get Covid vaccine already.  She is also tachycardic .  Consider PE versus pneumonia versus asthma exacerbation.  Will give albuterol and steroids and magnesium.  Will check CBC, CMP, lactate, CTA chest.  We will also give Rocephin and azithromycin empirically.  11:20 PM WBC is 17.  Lactate is 2. Patient's CT showed bilateral groundglass opacities consistent with pneumonia and previous Covid and no PE. Patient's oxygen level is normal .  She felt much better after Rocephin azithromycin and albuterol and Solu-Medrol.  Patient does not meet admission criteria currently for pneumonia.  I told patient that she should stop taking doxycycline and take Keflex and azithromycin instead.  We will also prescribe some Tussionex as well.  Final Clinical Impression(s) / ED Diagnoses Final diagnoses:  None    Rx / DC Orders ED Discharge Orders    None       Drenda Freeze, MD 12/26/19 [phone]

## 2019-12-26 NOTE — ED Notes (Signed)
Lactic acid 2.0 results received; Dr Darl Householder aware.

## 2019-12-27 NOTE — ED Notes (Signed)
Pt to be discharged after IV A/B.

## 2019-12-30 ENCOUNTER — Encounter (HOSPITAL_BASED_OUTPATIENT_CLINIC_OR_DEPARTMENT_OTHER): Payer: Self-pay | Admitting: *Deleted

## 2019-12-30 ENCOUNTER — Emergency Department (HOSPITAL_BASED_OUTPATIENT_CLINIC_OR_DEPARTMENT_OTHER)
Admission: EM | Admit: 2019-12-30 | Discharge: 2019-12-30 | Disposition: A | Discharge status: Home / Self Care | Payer: PPO | Attending: Emergency Medicine | Admitting: Emergency Medicine

## 2019-12-30 ENCOUNTER — Other Ambulatory Visit: Payer: Self-pay

## 2019-12-30 ENCOUNTER — Emergency Department (HOSPITAL_BASED_OUTPATIENT_CLINIC_OR_DEPARTMENT_OTHER): Payer: PPO

## 2019-12-30 DIAGNOSIS — Z20822 Contact with and (suspected) exposure to covid-19: Secondary | ICD-10-CM | POA: Diagnosis not present

## 2019-12-30 DIAGNOSIS — E039 Hypothyroidism, unspecified: Secondary | ICD-10-CM | POA: Diagnosis not present

## 2019-12-30 DIAGNOSIS — I10 Essential (primary) hypertension: Secondary | ICD-10-CM | POA: Insufficient documentation

## 2019-12-30 DIAGNOSIS — Z87891 Personal history of nicotine dependence: Secondary | ICD-10-CM | POA: Insufficient documentation

## 2019-12-30 DIAGNOSIS — Z888 Allergy status to other drugs, medicaments and biological substances status: Secondary | ICD-10-CM | POA: Insufficient documentation

## 2019-12-30 DIAGNOSIS — R7303 Prediabetes: Secondary | ICD-10-CM | POA: Insufficient documentation

## 2019-12-30 DIAGNOSIS — Z886 Allergy status to analgesic agent status: Secondary | ICD-10-CM | POA: Diagnosis not present

## 2019-12-30 DIAGNOSIS — E782 Mixed hyperlipidemia: Secondary | ICD-10-CM | POA: Diagnosis not present

## 2019-12-30 DIAGNOSIS — J40 Bronchitis, not specified as acute or chronic: Secondary | ICD-10-CM

## 2019-12-30 DIAGNOSIS — J189 Pneumonia, unspecified organism: Secondary | ICD-10-CM | POA: Diagnosis not present

## 2019-12-30 DIAGNOSIS — R0602 Shortness of breath: Secondary | ICD-10-CM | POA: Diagnosis not present

## 2019-12-30 DIAGNOSIS — Z79899 Other long term (current) drug therapy: Secondary | ICD-10-CM | POA: Insufficient documentation

## 2019-12-30 LAB — BASIC METABOLIC PANEL
Anion gap: 10 (ref 5–15)
BUN: 15 mg/dL (ref 6–20)
CO2: 28 mmol/L (ref 22–32)
Calcium: 9.2 mg/dL (ref 8.9–10.3)
Chloride: 95 mmol/L — ABNORMAL LOW (ref 98–111)
Creatinine, Ser: 0.81 mg/dL (ref 0.44–1.00)
GFR calc Af Amer: >60 mL/min (ref >60)
GFR calc non Af Amer: >60 mL/min (ref >60)
Glucose, Bld: 262 mg/dL — ABNORMAL HIGH (ref 70–99)
Potassium: 3.5 mmol/L (ref 3.5–5.1)
Sodium: 133 mmol/L — ABNORMAL LOW (ref 135–145)

## 2019-12-30 LAB — CBC WITH DIFFERENTIAL/PLATELET
Abs Immature Granulocytes: 1.1 10*3/uL — ABNORMAL HIGH (ref 0.00–0.07)
Basophils Absolute: 0.1 10*3/uL (ref 0.0–0.1)
Basophils Relative: 1 %
Eosinophils Absolute: 0.1 10*3/uL (ref 0.0–0.5)
Eosinophils Relative: 0 %
HCT: 37.2 % (ref 36.0–46.0)
Hemoglobin: 11.8 g/dL — ABNORMAL LOW (ref 12.0–15.0)
Immature Granulocytes: 5 %
Lymphocytes Relative: 27 %
Lymphs Abs: 5.4 10*3/uL — ABNORMAL HIGH (ref 0.7–4.0)
MCH: 27.1 pg (ref 26.0–34.0)
MCHC: 31.7 g/dL (ref 30.0–36.0)
MCV: 85.3 fL (ref 80.0–100.0)
Monocytes Absolute: 1.4 10*3/uL — ABNORMAL HIGH (ref 0.1–1.0)
Monocytes Relative: 7 %
Neutro Abs: 12.2 10*3/uL — ABNORMAL HIGH (ref 1.7–7.7)
Neutrophils Relative %: 60 %
Platelets: 333 10*3/uL (ref 150–400)
RBC: 4.36 MIL/uL (ref 3.87–5.11)
RDW: 17.4 % — ABNORMAL HIGH (ref 11.5–15.5)
WBC: 20.3 10*3/uL — ABNORMAL HIGH (ref 4.0–10.5)
nRBC: 0.1 % (ref 0.0–0.2)

## 2019-12-30 LAB — RESPIRATORY PANEL BY RT PCR (FLU A&B, COVID)
Influenza A by PCR: NEGATIVE
Influenza B by PCR: NEGATIVE
SARS Coronavirus 2 by RT PCR: NEGATIVE

## 2019-12-30 MED ORDER — ALBUTEROL SULFATE (2.5 MG/3ML) 0.083% IN NEBU
2.5000 mg | INHALATION_SOLUTION | Freq: Four times a day (QID) | RESPIRATORY_TRACT | 12 refills | Status: DC | PRN
Start: 2019-12-30 — End: 2020-03-13

## 2019-12-30 MED ORDER — ALBUTEROL (5 MG/ML) CONTINUOUS INHALATION SOLN
15.0000 mg/h | INHALATION_SOLUTION | RESPIRATORY_TRACT | Status: AC
  Administered 2019-12-30: 15 mg/h via RESPIRATORY_TRACT
  Filled 2019-12-30: qty 20

## 2019-12-30 MED ORDER — METHYLPREDNISOLONE 4 MG PO TBPK
ORAL_TABLET | ORAL | 0 refills | Status: DC
Start: 2019-12-30 — End: 2020-01-10

## 2019-12-30 MED ORDER — BENZONATATE 100 MG PO CAPS
100.0000 mg | ORAL_CAPSULE | Freq: Three times a day (TID) | ORAL | 0 refills | Status: DC
Start: 2019-12-30 — End: 2020-03-13

## 2019-12-30 NOTE — Discharge Instructions (Addendum)
You were seen today for ongoing cough and shortness of breath.  This is likely related to ongoing pneumonia and bronchitis.  Most of your symptoms seem related to acute bronchitis and bronchospasm.  You should avoid smoking.  Use your inhaler as needed.  Continue Tessalon Perles.  You can try honey for cough suppressant.

## 2019-12-30 NOTE — ED Provider Notes (Signed)
[facility]PART[facility] Provider Note   CSN: [id number] Arrival date & time: 12/30/19  0129     History Chief Complaint  Patient presents with  . Shortness of Breath    [name] is a 49 y.o. female.  HPI     This is a 49 year old female with a history of bipolar disorder, thyroid disease and recent history of pneumonia who presents with persistent cough and shortness of breath.  Patient was seen and evaluated by me approximately 1 week ago.  At that time she was diagnosed with pneumonia and had some bronchospasm.  She returned several days later and had additional work-up to include CT scan which showed atypical pneumonia.  Covid testing was negative.  Antibiotics were switched from doxycycline to Keflex and azithromycin.  She was also given a prednisone burst.  Patient states that she continues to have some shortness of breath.  She states that she gets into coughing fits that make her feel very short of breath.  She reports she is using her albuterol as frequently as every 2 hours.  No history of asthma but does have a history of significant smoking.  No chest pain.  No fevers greater than 99.0.  Past Medical History:  Diagnosis Date  . ADHD (attention deficit hyperactivity disorder)   . Anemia    with pregnancy  . Anxiety   . Arthritis   . Bipolar disorder ([place])   . Chronic back pain greater than 3 months duration   . Depression   . Dysuria    has to have self in and out cath   . Eroded bladder suspension mesh ([place])   . Hepatitis    C  . Hypothyroidism   . Leg pain, bilateral   . Obsessive-compulsive disorder   . Rosacea   . Sciatic nerve pain   . Social anxiety disorder   . Thyroid disease     Patient Active Problem List   Diagnosis Date Noted  . COVID-19 07/09/2019  . Left hip pain 04/30/2019  . Otitis externa of left ear 04/30/2019  . Cholesteatoma of left ear 04/30/2019  . Dysfunction of left eustachian tube 04/30/2019  . Left ear  pain 04/30/2019  . Sciatica 04/19/2019  . Class 3 severe obesity due to excess calories with serious comorbidity and body mass index (BMI) of 45.0 to 49.9 in adult ([place]) 04/19/2019  . Post-nasal drip 01/10/2019  . Allergic cough 01/10/2019  . Obstructive sleep apnea syndrome 12/11/2018  . Acute frontal sinusitis 12/10/2018  . Primary osteoarthritis of left hip 11/13/2018  . Body mass index 45.0-49.9, adult ([place]) 11/13/2018  . Morbid obesity ([place]) 11/13/2018  . Elevated cholesterol 11/12/2018  . Essential hypertension 11/12/2018  . Allergic rhinitis due to pollen 11/12/2018  . Reactive airway disease 11/12/2018  . Breast pain, right 07/17/2018  . Prediabetes 07/17/2018  . Refused influenza vaccine 07/17/2018  . Chronic nonintractable headache 02/12/2018  . Dysuria 02/12/2018  . Localized edema 02/12/2018  . Hospital discharge follow-up 01/23/2018  . Bladder problem 01/05/2018  . Depression 01/05/2018  . Insomnia 01/05/2018  . Hepatitis C infection 12/01/2015  . RLS (restless legs syndrome) 10/23/2015  . Elevated liver enzymes 10/22/2015  . Recurrent major depression-severe ([place]) 04/17/2015  . GAD (generalized anxiety disorder) 04/15/2015  . Bipolar depression ([place]) 04/15/2015  . Severe recurrent major depression w/psychotic features, mood-congruent ([place]) 04/14/2015  . Anxiety disorder 04/14/2015  . Major depressive disorder, recurrent severe without psychotic features ([place]) 04/14/2015  . Suicidal  ideations   . Chronic LBP 01/02/2015  . Adult hypothyroidism 06/25/2012  . Cobalamin deficiency 06/25/2012  . Fatigue 06/25/2012  . Chronic arthralgias of knees and hips 06/25/2012  . Leukocytosis 06/25/2012  . Vitamin D deficiency 06/25/2012    Past Surgical History:  Procedure Laterality Date  . BACK SURGERY     total of 6 back surgeries  . bladder mesh  2009  . DILITATION & CURRETTAGE/HYSTROSCOPY WITH NOVASURE ABLATION N/A 08/19/2016   Procedure: DILATATION & CURETTAGE WITH  NOVASURE ABLATION;  Surgeon: [name], MD;  Location: [facility];  Service: Gynecology;  Laterality: N/A;  . EUS N/A 09/09/2015   Procedure: UPPER ENDOSCOPIC ULTRASOUND (EUS) RADIAL;  Surgeon: [name], MD;  Location: [facility];  Service: Endoscopy;  Laterality: N/A;  . EYE SURGERY    . INCONTINENCE SURGERY    . SPINAL FUSION  2008   times two     OB History   No obstetric history on file.     Family History  Problem Relation Age of Onset  . Schizophrenia Father   . Schizophrenia Cousin     Social History   Tobacco Use  . Smoking status: Former Smoker    Packs/day: 1.00    Types: Cigarettes    Quit date: 02/09/2016    Years since quitting: 3.8  . Smokeless tobacco: Never Used  . Tobacco comment: use vapor  Substance Use Topics  . Alcohol use: No  . Drug use: No    Home Medications Prior to Admission medications   Medication Sig Start Date End Date Taking? Authorizing Provider  albuterol (PROVENTIL) (2.5 MG/3ML) 0.083% nebulizer solution Take 3 mLs (2.5 mg total) by nebulization every 6 (six) hours as needed for wheezing or shortness of breath. 12/30/19   Maximilian Tallo, Barbette Hair, MD  albuterol (VENTOLIN HFA) 108 (90 Base) MCG/ACT inhaler Inhale 2 puffs into the lungs every 4 (four) hours as needed for wheezing or shortness of breath. 12/24/19   Carigan Lister, Barbette Hair, MD  ALPRAZolam Duanne Moron) 0.5 MG tablet Take 0.5 mg by mouth 2 (two) times daily as needed. 12/20/19   [provider]  ARIPiprazole (ABILIFY) 5 MG tablet Take 1 tablet (5 mg total) by mouth daily. 09/17/15   Clarene Reamer, MD  atorvastatin (LIPITOR) 20 MG tablet Take 1 tablet (20 mg total) by mouth daily. 08/22/19   Libby Maw, MD  azithromycin (ZITHROMAX) 250 MG tablet Take 1 tablet (250 mg total) by mouth daily. Take first 2 tablets together, then 1 every day until finished. 12/26/19   Drenda Freeze, MD  cephALEXin (KEFLEX) 500 MG capsule Take 1 capsule (500 mg total) by mouth 3 (three)  times daily. 12/26/19   Drenda Freeze, MD  chlorpheniramine-HYDROcodone [facility] PENNKINETIC ER) 10-8 MG/5ML SUER Take 5 mLs by mouth every 12 (twelve) hours as needed for cough. 12/26/19   Drenda Freeze, MD  clomiPRAMINE (ANAFRANIL) 75 MG capsule Take 75 mg by mouth at bedtime.    [provider]  doxycycline (VIBRAMYCIN) 100 MG capsule Take 1 capsule (100 mg total) by mouth 2 (two) times daily. 12/24/19   Raesha Coonrod, Barbette Hair, MD  gabapentin (NEURONTIN) 300 MG capsule TAKE 1 CAPSULE(300 MG) BY MOUTH THREE TIMES DAILY AS NEEDED 12/23/19   Libby Maw, MD  lamoTRIgine (LAMICTAL) 100 MG tablet Take 1 tablet (100 mg total) by mouth daily. Patient taking differently: Take 150 mg by mouth daily.  09/17/15   Clarene Reamer, MD  levothyroxine ([place])  75 MCG tablet Take 1 tablet (75 mcg total) by mouth daily. 11/13/18   Libby Maw, MD  lisinopril (ZESTRIL) 10 MG tablet TAKE 1 TABLET(10 MG) BY MOUTH DAILY 11/08/19   Libby Maw, MD  methylphenidate (RITALIN) 10 MG tablet Take 1 tablet by mouth 2 (two) times daily. 08/26/18   [provider]  methylPREDNISolone (MEDROL DOSEPAK) 4 MG TBPK tablet Take as directed on packet 12/30/19   Rhyse Skowron, Barbette Hair, MD  nabumetone (RELAFEN) 750 MG tablet Take 1 tablet (750 mg total) by mouth 2 (two) times daily as needed. 05/28/19   Hilts, Legrand Como, MD  predniSONE (DELTASONE) 20 MG tablet Take 60 mg daily x 2 days then 40 mg daily x 2 days then 20 mg daily x 2 days 12/26/19   Drenda Freeze, MD    Allergies    Pregabalin, Celebrex [celecoxib], Effexor [venlafaxine hcl], Mirapex [pramipexole], Pristiq [desvenlafaxine], Prozac [fluoxetine hcl], Symbyax [olanzapine-fluoxetine hcl], and Wellbutrin [bupropion hcl]  Review of Systems   Review of Systems  Constitutional: Negative for fever.  Respiratory: Positive for cough, shortness of breath and wheezing.   Cardiovascular: Negative for chest pain and  leg swelling.  Gastrointestinal: Negative for abdominal pain, nausea and vomiting.  Genitourinary: Negative for dysuria.  All other systems reviewed and are negative.   Physical Exam Updated Vital Signs BP 130/71 (BP Location: Right Arm)   Pulse 100   Temp 98.1 F (36.7 C) (Oral)   Resp 18   Ht 1.626 m (5\' 4" )   Wt 127 kg   LMP 11/29/2019   SpO2 97%   BMI 48.06 kg/m   Physical Exam Vitals and nursing note reviewed.  Constitutional:      Appearance: She is well-developed. She is obese. She is not ill-appearing.  HENT:     Head: Normocephalic and atraumatic.  Eyes:     Pupils: Pupils are equal, round, and reactive to light.  Cardiovascular:     Rate and Rhythm: Regular rhythm. Tachycardia present.     Heart sounds: Normal heart sounds.  Pulmonary:     Effort: Pulmonary effort is normal. No respiratory distress.     Breath sounds: Wheezing present.     Comments: Coarse wheezes in all lung fields, fair air movement Abdominal:     General: Bowel sounds are normal.     Palpations: Abdomen is soft.  Musculoskeletal:     Cervical back: Neck supple.     Right lower leg: No tenderness. No edema.     Left lower leg: No tenderness. No edema.  Skin:    General: Skin is warm and dry.  Neurological:     Mental Status: She is alert and oriented to person, place, and time.  Psychiatric:        Mood and Affect: Mood normal.     ED Results / Procedures / Treatments   Labs (all labs ordered are listed, but only abnormal results are displayed) Labs Reviewed  CBC WITH DIFFERENTIAL/PLATELET - Abnormal; Notable for the following components:      Result Value   WBC 20.3 (*)    Hemoglobin 11.8 (*)    RDW 17.4 (*)    Neutro Abs 12.2 (*)    Lymphs Abs 5.4 (*)    Monocytes Absolute 1.4 (*)    Abs Immature Granulocytes 1.10 (*)    All other components within normal limits  BASIC METABOLIC PANEL - Abnormal; Notable for the following components:   Sodium 133 (*)    Chloride  95 (*)     Glucose, Bld 262 (*)    All other components within normal limits  RESPIRATORY PANEL BY RT PCR (FLU A&B, COVID)    EKG None  Radiology DG Chest Portable 1 View  Result Date: 12/30/2019 CLINICAL DATA:  Shortness of breath. EXAM: PORTABLE CHEST 1 VIEW COMPARISON:  December 26, 2019 FINDINGS: The heart size and mediastinal contours are within normal limits. Both lungs are clear. The visualized skeletal structures are unremarkable. IMPRESSION: No active disease. Electronically Signed   By: Virgina Norfolk M.D.   On: 12/30/2019 02:17    Procedures Procedures (including critical care time)  CRITICAL CARE Performed by: Merryl Hacker   Total critical care time: 40 minutes  Critical care time was exclusive of separately billable procedures and treating other patients.  Critical care was necessary to treat or prevent imminent or life-threatening deterioration.  Critical care was time spent personally by me on the following activities: development of treatment plan with patient and/or surrogate as well as nursing, discussions with consultants, evaluation of patient's response to treatment, examination of patient, obtaining history from patient or surrogate, ordering and performing treatments and interventions, ordering and review of laboratory studies, ordering and review of radiographic studies, pulse oximetry and re-evaluation of patient's condition.   Medications Ordered in ED Medications  albuterol (PROVENTIL,VENTOLIN) solution continuous neb (15 mg/hr Nebulization New Bag/Given 12/30/19 0210)    ED Course  I have reviewed the triage vital signs and the nursing notes.  Pertinent labs & imaging results that were available during my care of the patient were reviewed by me and considered in my medical decision making (see chart for details).    MDM Rules/Calculators/A&P                       Patient presents with ongoing shortness of breath and cough.  Recent diagnosis of  pneumonia and bronchospasm.  Patient is overall nontoxic-appearing.  No respiratory distress.  O2 sats 97 to 100% on room air.  She does have diffuse expiratory wheezing on exam.  No known history of asthma.  Likely has COPD related to smoking.  She is currently on antibiotics and prednisone.  I have reviewed her chart from her most recent visit.  CT scan concerning for atypical pneumonia although Covid testing was negative.  Will resend Covid testing given appearance of CT scan.  Repeat chest x-ray is actually improved.  CBC obtained and shows a leukocytosis to 20 which is an increase although patient is on prednisone as well.  Unclear the clinical significance of this.  Patient was given a continuous neb.  On recheck, she states she feels somewhat better.  She has some persistent wheezing but better aeration and no respiratory distress.  I discussed with the patient that I felt her symptoms were most likely related to bronchospasm and bronchitis from her acute illness.  I discussed with her that the cough can last for up to 6 to 8 weeks.  Clinically she did improve with albuterol and her chest x-ray is improving.  Because of this, do not feel strongly to change her antibiotics at this time.  Will give a longer taper of steroids and have her follow-up closely with her primary physician.  Encouraged continued albuterol use and finishing antibiotics.  After history, exam, and medical workup I feel the patient has been appropriately medically screened and is safe for discharge home. Pertinent diagnoses were discussed with the patient. Patient was given return  precautions.   Final Clinical Impression(s) / ED Diagnoses Final diagnoses:  Bronchitis  Community acquired pneumonia, unspecified laterality    Rx / DC Orders ED Discharge Orders         Ordered    methylPREDNISolone (MEDROL DOSEPAK) 4 MG TBPK tablet     12/30/19 0451    albuterol (PROVENTIL) (2.5 MG/3ML) 0.083% nebulizer solution  Every 6 hours  PRN     12/30/19 0452           Merryl Hacker, MD 12/30/19 [phone]

## 2019-12-30 NOTE — ED Triage Notes (Addendum)
Pt states this is her 3rd visit for sob. States she has been diagnosed with PN. Pt c/o of dry cough. C/o of 99.0 fever. Has used albuterol a couple hours ago without relief. Pt c/o feeling sob. No distress at present. Pt with expiratory wheezing noted.

## 2020-01-01 LAB — CULTURE, BLOOD (ROUTINE X 2)
Culture: NO GROWTH
Culture: NO GROWTH
Special Requests: ADEQUATE
Special Requests: ADEQUATE

## 2020-01-02 ENCOUNTER — Encounter: Payer: Self-pay | Admitting: Family Medicine

## 2020-01-02 ENCOUNTER — Telehealth (INDEPENDENT_AMBULATORY_CARE_PROVIDER_SITE_OTHER): Payer: PPO | Admitting: Family Medicine

## 2020-01-02 VITALS — Temp 99.5°F | Ht 64.0 in | Wt 280.0 lb

## 2020-01-02 DIAGNOSIS — J301 Allergic rhinitis due to pollen: Secondary | ICD-10-CM | POA: Diagnosis not present

## 2020-01-02 DIAGNOSIS — J0110 Acute frontal sinusitis, unspecified: Secondary | ICD-10-CM | POA: Diagnosis not present

## 2020-01-02 DIAGNOSIS — J040 Acute laryngitis: Secondary | ICD-10-CM

## 2020-01-02 NOTE — Progress Notes (Signed)
Established Patient Office Visit  Subjective:  Patient ID: [name], female    DOB: 1971/06/14  Age: 49 y.o. MRN: [id number]  CC:  Chief Complaint  Patient presents with  . Follow-up    follow up on pneumonia, pt would like to know when will her voice be back.   . Pneumonia    HPI [name] presents for emergency room follow-up.  Diagnosed with community-acquired pneumonia with reactive airway disease back on the 19th of of this month treated with antibiotics and steroids.  Chest x-ray taking 3 days ago shows initial clearing of her pneumonia.  She continues to cough with some phlegm production but denies fever or difficulty breathing.  She lost her voice 2 days ago.  Past Medical History:  Diagnosis Date  . ADHD (attention deficit hyperactivity disorder)   . Anemia    with pregnancy  . Anxiety   . Arthritis   . Bipolar disorder ([place])   . Chronic back pain greater than 3 months duration   . Depression   . Dysuria    has to have self in and out cath   . Eroded bladder suspension mesh ([place])   . Hepatitis    C  . Hypothyroidism   . Leg pain, bilateral   . Obsessive-compulsive disorder   . Rosacea   . Sciatic nerve pain   . Social anxiety disorder   . Thyroid disease     Past Surgical History:  Procedure Laterality Date  . BACK SURGERY     total of 6 back surgeries  . bladder mesh  2009  . DILITATION & CURRETTAGE/HYSTROSCOPY WITH NOVASURE ABLATION N/A 08/19/2016   Procedure: DILATATION & CURETTAGE WITH NOVASURE ABLATION;  Surgeon: [name], MD;  Location: [facility];  Service: Gynecology;  Laterality: N/A;  . EUS N/A 09/09/2015   Procedure: UPPER ENDOSCOPIC ULTRASOUND (EUS) RADIAL;  Surgeon: [name], MD;  Location: [facility];  Service: Endoscopy;  Laterality: N/A;  . EYE SURGERY    . INCONTINENCE SURGERY    . SPINAL FUSION  2008   times two    Family History  Problem Relation Age of Onset  . Schizophrenia Father   . Schizophrenia Cousin      Social History   Socioeconomic History  . Marital status: Married    Spouse name: Not on file  . Number of children: Not on file  . Years of education: Not on file  . Highest education level: Not on file  Occupational History  . Not on file  Tobacco Use  . Smoking status: Former Smoker    Packs/day: 1.00    Types: Cigarettes    Quit date: 02/09/2016    Years since quitting: 3.8  . Smokeless tobacco: Never Used  . Tobacco comment: use vapor  Substance and Sexual Activity  . Alcohol use: No  . Drug use: No  . Sexual activity: Not on file  Other Topics Concern  . Not on file  Social History Narrative  . Not on file   Social Determinants of Health   Financial Resource Strain:   . Difficulty of Paying Living Expenses:   Food Insecurity:   . Worried About Charity fundraiser in the Last Year:   . Arboriculturist in the Last Year:   Transportation Needs:   . Film/video editor (Medical):   Marland Kitchen Lack of Transportation (Non-Medical):   Physical Activity:   . Days of Exercise per Week:   .  Minutes of Exercise per Session:   Stress:   . Feeling of Stress :   Social Connections:   . Frequency of Communication with Friends and Family:   . Frequency of Social Gatherings with Friends and Family:   . Attends Religious Services:   . Active Member of Clubs or Organizations:   . Attends Archivist Meetings:   Marland Kitchen Marital Status:   Intimate Partner Violence:   . Fear of Current or Ex-Partner:   . Emotionally Abused:   Marland Kitchen Physically Abused:   . Sexually Abused:     Outpatient Medications Prior to Visit  Medication Sig Dispense Refill  . albuterol (VENTOLIN HFA) 108 (90 Base) MCG/ACT inhaler Inhale 2 puffs into the lungs every 4 (four) hours as needed for wheezing or shortness of breath. 18 g 0  . ALPRAZolam (XANAX) 0.5 MG tablet Take 0.5 mg by mouth 2 (two) times daily as needed.    . ARIPiprazole (ABILIFY) 5 MG tablet Take 1 tablet (5 mg total) by mouth daily. 30  tablet 2  . atorvastatin (LIPITOR) 20 MG tablet Take 1 tablet (20 mg total) by mouth daily. 100 tablet 0  . benzonatate (TESSALON) 100 MG capsule Take 1 capsule (100 mg total) by mouth every 8 (eight) hours. 21 capsule 0  . cephALEXin (KEFLEX) 500 MG capsule Take 1 capsule (500 mg total) by mouth 3 (three) times daily. 21 capsule 0  . chlorpheniramine-HYDROcodone (TUSSIONEX PENNKINETIC ER) 10-8 MG/5ML SUER Take 5 mLs by mouth every 12 (twelve) hours as needed for cough. 70 mL 0  . clomiPRAMINE (ANAFRANIL) 75 MG capsule Take 75 mg by mouth at bedtime.    . gabapentin (NEURONTIN) 300 MG capsule TAKE 1 CAPSULE(300 MG) BY MOUTH THREE TIMES DAILY AS NEEDED 90 capsule 0  . lamoTRIgine (LAMICTAL) 100 MG tablet Take 1 tablet (100 mg total) by mouth daily. (Patient taking differently: Take 150 mg by mouth daily. ) 30 tablet 2  . levothyroxine (SYNTHROID, LEVOTHROID) 75 MCG tablet Take 1 tablet (75 mcg total) by mouth daily. 90 tablet 3  . lisinopril (ZESTRIL) 10 MG tablet TAKE 1 TABLET(10 MG) BY MOUTH DAILY 100 tablet 1  . methylphenidate (RITALIN) 10 MG tablet Take 1 tablet by mouth 2 (two) times daily.    . methylPREDNISolone (MEDROL DOSEPAK) 4 MG TBPK tablet Take as directed on packet 21 tablet 0  . albuterol (PROVENTIL) (2.5 MG/3ML) 0.083% nebulizer solution Take 3 mLs (2.5 mg total) by nebulization every 6 (six) hours as needed for wheezing or shortness of breath. (Patient not taking: Reported on 01/02/2020) 75 mL 12  . azithromycin (ZITHROMAX) 250 MG tablet Take 1 tablet (250 mg total) by mouth daily. Take first 2 tablets together, then 1 every day until finished. (Patient not taking: Reported on 01/02/2020) 6 tablet 0  . doxycycline (VIBRAMYCIN) 100 MG capsule Take 1 capsule (100 mg total) by mouth 2 (two) times daily. (Patient not taking: Reported on 01/02/2020) 20 capsule 0  . nabumetone (RELAFEN) 750 MG tablet Take 1 tablet (750 mg total) by mouth 2 (two) times daily as needed. 60 tablet 6  .  predniSONE (DELTASONE) 20 MG tablet Take 60 mg daily x 2 days then 40 mg daily x 2 days then 20 mg daily x 2 days (Patient not taking: Reported on 01/02/2020) 12 tablet 0   No facility-administered medications prior to visit.    Allergies  Allergen Reactions  . Pregabalin Anaphylaxis  . Celebrex [Celecoxib] Nausea And Vomiting  Stomach pain  . Effexor [Venlafaxine Hcl] Other (See Comments)    Serotonin toxicity  . Mirapex [Pramipexole]     Mirapex- Worsening "body jerks"  . Pristiq [Desvenlafaxine] Other (See Comments)    Serotonin toxicity   . Prozac [Fluoxetine Hcl] Swelling  . Symbyax [Olanzapine-Fluoxetine Hcl] Swelling  . Wellbutrin [Bupropion Hcl] Swelling    ROS Review of Systems  Constitutional: Negative for chills, diaphoresis, fatigue, fever and unexpected weight change.  HENT: Positive for voice change. Negative for sore throat and trouble swallowing.   Eyes: Negative for photophobia and visual disturbance.  Respiratory: Positive for cough. Negative for chest tightness, shortness of breath and wheezing.   Cardiovascular: Negative.   Gastrointestinal: Negative.       Objective:    Physical Exam  Constitutional: She is oriented to person, place, and time. No distress.  Pulmonary/Chest: Effort normal.  Neurological: She is alert and oriented to person, place, and time.  Psychiatric: She has a normal mood and affect. Her behavior is normal.    Temp 99.5 F (37.5 C) (Tympanic)   Ht 5\' 4"  (1.626 m)   Wt 280 lb (127 kg) Comment: per pt  BMI 48.06 kg/m  Wt Readings from Last 3 Encounters:  01/02/20 280 lb (127 kg)  12/30/19 280 lb (127 kg)  12/23/19 279 lb 15.8 oz (127 kg)     Health Maintenance Due  Topic Date Due  . HIV Screening  Never done  . COVID-19 Vaccine (1) Never done  . PAP SMEAR-Modifier  07/15/2019    There are no preventive care reminders to display for this patient.  Lab Results  Component Value Date   TSH 2.68 11/12/2018   Lab  Results  Component Value Date   WBC 20.3 (H) 12/30/2019   HGB 11.8 (L) 12/30/2019   HCT 37.2 12/30/2019   MCV 85.3 12/30/2019   PLT 333 12/30/2019   Lab Results  Component Value Date   NA 133 (L) 12/30/2019   K 3.5 12/30/2019   CO2 28 12/30/2019   GLUCOSE 262 (H) 12/30/2019   BUN 15 12/30/2019   CREATININE 0.81 12/30/2019   BILITOT 0.3 12/26/2019   ALKPHOS 83 12/26/2019   AST 35 12/26/2019   ALT 45 (H) 12/26/2019   PROT 7.1 12/26/2019   ALBUMIN 3.6 12/26/2019   CALCIUM 9.2 12/30/2019   ANIONGAP 10 12/30/2019   GFR 72.04 11/12/2018   Lab Results  Component Value Date   CHOL 166 11/12/2018   Lab Results  Component Value Date   HDL 35.80 (L) 11/12/2018   Lab Results  Component Value Date   LDLCALC 174 (H) 04/15/2015   Lab Results  Component Value Date   TRIG 221.0 (H) 11/12/2018   Lab Results  Component Value Date   CHOLHDL 5 11/12/2018   No results found for: HGBA1C    Assessment & Plan:   Problem List Items Addressed This Visit    None    Visit Diagnoses    Laryngitis    -  Primary      No orders of the defined types were placed in this encounter.   Follow-up: Return in about 5 weeks (around 02/06/2020).  Patient will complete her antibiotic and steroid tapers.  She will rest her voice as able.  She will follow-up in 5 weeks for repeat chest x-ray to assure clearing.  We will follow-up sooner with the development of any fever or increased difficulty breathing.  Virtual Visit via Video Note  I connected with  Jenean L Fazekas on 01/02/20 at 11:30 AM EDT by a video enabled telemedicine application and verified that I am speaking with the correct person using two identifiers.  Location: Patient: home  Provider:   I discussed the limitations of evaluation and management by telemedicine and the availability of in person appointments. The patient expressed understanding and agreed to proceed.  History of Present Illness:     Observations/Objective:   Assessment and Plan:   Follow Up Instructions:    I discussed the assessment and treatment plan with the patient. The patient was provided an opportunity to ask questions and all were answered. The patient agreed with the plan and demonstrated an understanding of the instructions.   The patient was advised to call back or seek an in-person evaluation if the symptoms worsen or if the condition fails to improve as anticipated.  I provided 20 minutes of non-face-to-face time during this encounter.   Libby Maw, MD   Libby Maw, MD   Interactive video and audio telecommunications were attempted between myself and the patient. However they failed due to the patient having technical difficulties or not having access to video capability. We continued and completed with audio only.

## 2020-01-10 ENCOUNTER — Other Ambulatory Visit: Payer: Self-pay

## 2020-01-10 ENCOUNTER — Telehealth (INDEPENDENT_AMBULATORY_CARE_PROVIDER_SITE_OTHER): Payer: PPO | Admitting: Family

## 2020-01-10 ENCOUNTER — Encounter: Payer: Self-pay | Admitting: Family

## 2020-01-10 DIAGNOSIS — J040 Acute laryngitis: Secondary | ICD-10-CM | POA: Diagnosis not present

## 2020-01-10 MED ORDER — PREDNISONE 20 MG PO TABS: 40.0000 mg | ORAL_TABLET | Freq: Every day | ORAL | 0 refills | Status: DC

## 2020-01-10 NOTE — Progress Notes (Signed)
Acute Office Visit  Subjective:    Patient ID: [name], female    DOB: 1971/03/10, 49 y.o.   MRN: [id number]  No chief complaint on file.   HPI  Virtual Visit via Telephone Note  I connected with [name] on 01/10/20 at  9:40 AM EDT by telephone and verified that I am speaking with the correct person using two identifiers.  Location: Patient: Home Provider: [name]   I discussed the limitations, risks, security and privacy concerns of performing an evaluation and management service by telephone and the availability of in person appointments. I also discussed with the patient that there may be a patient responsible charge related to this service. The patient expressed understanding and agreed to proceed.   History of Present Illness: Patient requests a virtual visit today with c/o loss of voice approximately 2 weeks ago that has not fully returned. She was seen in the ED and diagnosed with CAP. She has been on antibiotics and steroids that she has completed. She feels better and breathing well. No cough. However her voice has not returned. She denies any heartburn, indigestion, or epigastic pain.      Observations/Objective: raspy voice   Assessment and Plan:Laryngitis   Follow Up Instructions: Diagnoses and all orders for this visit:  Laryngitis  Other orders -     predniSONE (DELTASONE) 20 MG tablet; Take 2 tablets (40 mg total) by mouth daily with breakfast.  Will refer to ENT for consultation if laryngitis does not resolve.     I discussed the assessment and treatment plan with the patient. The patient was provided an opportunity to ask questions and all were answered. The patient agreed with the plan and demonstrated an understanding of the instructions.   The patient was advised to call back or seek an in-person evaluation if the symptoms worsen or if the condition fails to improve as anticipated.  I provided 15 minutes of non-face-to-face  time during this encounter.   Kennyth Arnold, FNP   Past Medical History:  Diagnosis Date  . ADHD (attention deficit hyperactivity disorder)   . Anemia    with pregnancy  . Anxiety   . Arthritis   . Bipolar disorder ([place])   . Chronic back pain greater than 3 months duration   . Depression   . Dysuria    has to have self in and out cath   . Eroded bladder suspension mesh ([place])   . Hepatitis    C  . Hypothyroidism   . Leg pain, bilateral   . Obsessive-compulsive disorder   . Rosacea   . Sciatic nerve pain   . Social anxiety disorder   . Thyroid disease     Past Surgical History:  Procedure Laterality Date  . BACK SURGERY     total of 6 back surgeries  . bladder mesh  2009  . DILITATION & CURRETTAGE/HYSTROSCOPY WITH NOVASURE ABLATION N/A 08/19/2016   Procedure: DILATATION & CURETTAGE WITH NOVASURE ABLATION;  Surgeon: [name], MD;  Location: [facility];  Service: Gynecology;  Laterality: N/A;  . EUS N/A 09/09/2015   Procedure: UPPER ENDOSCOPIC ULTRASOUND (EUS) RADIAL;  Surgeon: [name], MD;  Location: [facility];  Service: Endoscopy;  Laterality: N/A;  . EYE SURGERY    . INCONTINENCE SURGERY    . SPINAL FUSION  2008   times two    Family History  Problem Relation Age of Onset  . Schizophrenia Father   . Schizophrenia Cousin  Social History   Socioeconomic History  . Marital status: Married    Spouse name: Not on file  . Number of children: Not on file  . Years of education: Not on file  . Highest education level: Not on file  Occupational History  . Not on file  Tobacco Use  . Smoking status: Former Smoker    Packs/day: 1.00    Types: Cigarettes    Quit date: 02/09/2016    Years since quitting: 3.9  . Smokeless tobacco: Never Used  . Tobacco comment: use vapor  Substance and Sexual Activity  . Alcohol use: No  . Drug use: No  . Sexual activity: Not on file  Other Topics Concern  . Not on file  Social History Narrative  . Not on file     Social Determinants of Health   Financial Resource Strain:   . Difficulty of Paying Living Expenses:   Food Insecurity:   . Worried About Charity fundraiser in the Last Year:   . Arboriculturist in the Last Year:   Transportation Needs:   . Film/video editor (Medical):   Marland Kitchen Lack of Transportation (Non-Medical):   Physical Activity:   . Days of Exercise per Week:   . Minutes of Exercise per Session:   Stress:   . Feeling of Stress :   Social Connections:   . Frequency of Communication with Friends and Family:   . Frequency of Social Gatherings with Friends and Family:   . Attends Religious Services:   . Active Member of Clubs or Organizations:   . Attends Archivist Meetings:   Marland Kitchen Marital Status:   Intimate Partner Violence:   . Fear of Current or Ex-Partner:   . Emotionally Abused:   Marland Kitchen Physically Abused:   . Sexually Abused:     Outpatient Medications Prior to Visit  Medication Sig Dispense Refill  . albuterol (PROVENTIL) (2.5 MG/3ML) 0.083% nebulizer solution Take 3 mLs (2.5 mg total) by nebulization every 6 (six) hours as needed for wheezing or shortness of breath. (Patient not taking: Reported on 01/02/2020) 75 mL 12  . albuterol (VENTOLIN HFA) 108 (90 Base) MCG/ACT inhaler Inhale 2 puffs into the lungs every 4 (four) hours as needed for wheezing or shortness of breath. 18 g 0  . ALPRAZolam (XANAX) 0.5 MG tablet Take 0.5 mg by mouth 2 (two) times daily as needed.    . ARIPiprazole (ABILIFY) 5 MG tablet Take 1 tablet (5 mg total) by mouth daily. 30 tablet 2  . atorvastatin (LIPITOR) 20 MG tablet Take 1 tablet (20 mg total) by mouth daily. 100 tablet 0  . azithromycin (ZITHROMAX) 250 MG tablet Take 1 tablet (250 mg total) by mouth daily. Take first 2 tablets together, then 1 every day until finished. (Patient not taking: Reported on 01/02/2020) 6 tablet 0  . benzonatate (TESSALON) 100 MG capsule Take 1 capsule (100 mg total) by mouth every 8 (eight) hours. 21  capsule 0  . cephALEXin (KEFLEX) 500 MG capsule Take 1 capsule (500 mg total) by mouth 3 (three) times daily. 21 capsule 0  . chlorpheniramine-HYDROcodone (TUSSIONEX PENNKINETIC ER) 10-8 MG/5ML SUER Take 5 mLs by mouth every 12 (twelve) hours as needed for cough. 70 mL 0  . clomiPRAMINE (ANAFRANIL) 75 MG capsule Take 75 mg by mouth at bedtime.    Marland Kitchen doxycycline (VIBRAMYCIN) 100 MG capsule Take 1 capsule (100 mg total) by mouth 2 (two) times daily. (Patient not taking: Reported on  01/02/2020) 20 capsule 0  . gabapentin (NEURONTIN) 300 MG capsule TAKE 1 CAPSULE(300 MG) BY MOUTH THREE TIMES DAILY AS NEEDED 90 capsule 0  . lamoTRIgine (LAMICTAL) 100 MG tablet Take 1 tablet (100 mg total) by mouth daily. (Patient taking differently: Take 150 mg by mouth daily. ) 30 tablet 2  . levothyroxine (SYNTHROID, LEVOTHROID) 75 MCG tablet Take 1 tablet (75 mcg total) by mouth daily. 90 tablet 3  . lisinopril (ZESTRIL) 10 MG tablet TAKE 1 TABLET(10 MG) BY MOUTH DAILY 100 tablet 1  . methylphenidate (RITALIN) 10 MG tablet Take 1 tablet by mouth 2 (two) times daily.    . nabumetone (RELAFEN) 750 MG tablet Take 1 tablet (750 mg total) by mouth 2 (two) times daily as needed. 60 tablet 6  . methylPREDNISolone (MEDROL DOSEPAK) 4 MG TBPK tablet Take as directed on packet 21 tablet 0  . predniSONE (DELTASONE) 20 MG tablet Take 60 mg daily x 2 days then 40 mg daily x 2 days then 20 mg daily x 2 days (Patient not taking: Reported on 01/02/2020) 12 tablet 0   No facility-administered medications prior to visit.    Allergies  Allergen Reactions  . Pregabalin Anaphylaxis  . Celebrex [Celecoxib] Nausea And Vomiting    Stomach pain  . Effexor [Venlafaxine Hcl] Other (See Comments)    Serotonin toxicity  . Mirapex [Pramipexole]     Mirapex- Worsening "body jerks"  . Pristiq [Desvenlafaxine] Other (See Comments)    Serotonin toxicity   . Prozac [Fluoxetine Hcl] Swelling  . Symbyax [Olanzapine-Fluoxetine Hcl] Swelling  .  Wellbutrin [Bupropion Hcl] Swelling    Review of Systems     Objective:    Physical Exam  There were no vitals taken for this visit. Wt Readings from Last 3 Encounters:  01/02/20 280 lb (127 kg)  12/30/19 280 lb (127 kg)  12/23/19 279 lb 15.8 oz (127 kg)    Health Maintenance Due  Topic Date Due  . HIV Screening  Never done  . COVID-19 Vaccine (1) Never done  . PAP SMEAR-Modifier  07/15/2019    There are no preventive care reminders to display for this patient.   Lab Results  Component Value Date   TSH 2.68 11/12/2018   Lab Results  Component Value Date   WBC 20.3 (H) 12/30/2019   HGB 11.8 (L) 12/30/2019   HCT 37.2 12/30/2019   MCV 85.3 12/30/2019   PLT 333 12/30/2019   Lab Results  Component Value Date   NA 133 (L) 12/30/2019   K 3.5 12/30/2019   CO2 28 12/30/2019   GLUCOSE 262 (H) 12/30/2019   BUN 15 12/30/2019   CREATININE 0.81 12/30/2019   BILITOT 0.3 12/26/2019   ALKPHOS 83 12/26/2019   AST 35 12/26/2019   ALT 45 (H) 12/26/2019   PROT 7.1 12/26/2019   ALBUMIN 3.6 12/26/2019   CALCIUM 9.2 12/30/2019   ANIONGAP 10 12/30/2019   GFR 72.04 11/12/2018   Lab Results  Component Value Date   CHOL 166 11/12/2018   Lab Results  Component Value Date   HDL 35.80 (L) 11/12/2018   Lab Results  Component Value Date   LDLCALC 174 (H) 04/15/2015   Lab Results  Component Value Date   TRIG 221.0 (H) 11/12/2018   Lab Results  Component Value Date   CHOLHDL 5 11/12/2018   No results found for: HGBA1C     Assessment & Plan:   Problem List Items Addressed This Visit    None  Visit Diagnoses    Laryngitis    -  Primary       Meds ordered this encounter  Medications  . predniSONE (DELTASONE) 20 MG tablet    Sig: Take 2 tablets (40 mg total) by mouth daily with breakfast.    Dispense:  10 tablet    Refill:  0     Kennyth Arnold, FNP

## 2020-01-10 NOTE — Patient Instructions (Signed)
Laryngitis °Laryngitis is irritation and swelling (inflammation) of your vocal cords. This condition causes symptoms such as: °· A change in your voice. It may sound low and hoarse. °· Loss of voice. °· Coughing. °· Sore throat. °· Dry throat. °· Stuffy nose. °Depending on the cause, this condition may go away after a short time or may last for more than 3 weeks. Treatment often involves resting your voice and using medicines to soothe your throat. °Follow these instructions at home: °Medicines °· Take over-the-counter and prescription medicines only as told by your doctor. °· If you were prescribed an antibiotic medicine, take it as told by your doctor. Do not stop taking it even if you start to feel better. °General instructions °· Talk as little as possible. Also avoid whispering. °· Write instead of talking. Do this until your voice is back to normal. °· Drink enough fluid to keep your pee (urine) pale yellow. °· Breathe in moist air. Use a humidifier if you live in a dry climate. °· Do not use any products that have nicotine or tobacco in them, such as cigarettes and e-cigarettes. If you need help quitting, ask your doctor. °Contact a doctor if: °· You have a fever. °· Your pain is worse. °· Your symptoms do not get better in 2 weeks. °Get help right away if: °· You cough up blood. °· You have trouble swallowing. °· You have trouble breathing. °Summary °· Laryngitis is inflammation of your vocal cords. °· This condition causes your voice to sound low and hoarse. °· Rest your voice by talking as little as possible. Also avoid whispering. °This information is not intended to replace advice given to you by your health care provider. Make sure you discuss any questions you have with your health care provider. °Document Revised: 08/09/2017 Document Reviewed: 08/09/2017 °Elsevier Patient Education © 2020 [employer] ° °

## 2020-01-15 ENCOUNTER — Telehealth: Payer: Self-pay | Admitting: Family Medicine

## 2020-01-15 NOTE — Telephone Encounter (Signed)
Patient is calling and stated that she had a phone visit on Friday and was told that if her symptoms didn't improve to call the office and ask for a referral to see an ENT specialist. CB is [phone]

## 2020-01-15 NOTE — Telephone Encounter (Signed)
Dr. Ethelene Hal please advise, okay refer to ENT with no improvement with symptoms?

## 2020-01-15 NOTE — Addendum Note (Signed)
Addended by: Lynda Rainwater on: 01/15/2020 01:53 PM   Modules accepted: Orders

## 2020-01-15 NOTE — Telephone Encounter (Signed)
Patient aware that referral sent to be scheduled with ENT

## 2020-01-15 NOTE — Telephone Encounter (Signed)
Okay for ENT referral for persisting Laryngitis.

## 2020-01-18 ENCOUNTER — Other Ambulatory Visit: Payer: Self-pay | Admitting: Family Medicine

## 2020-01-20 ENCOUNTER — Telehealth: Payer: Self-pay | Admitting: Family Medicine

## 2020-01-20 NOTE — Telephone Encounter (Signed)
Patient is calling and wanted to see if she can come in for an x ray t see if pneumonia was gone. Pls advise. CB is [phone]

## 2020-01-21 ENCOUNTER — Telehealth: Payer: Self-pay | Admitting: Family Medicine

## 2020-01-21 NOTE — Telephone Encounter (Signed)
Patient is calling and stated that she thinks she has a urinary tract infection and if something can be called in to the pharmacy. Also patient is following up about previous message left. CB is [phone]

## 2020-01-21 NOTE — Telephone Encounter (Signed)
Spoke with patient who verbally understood she will need to come in the office for evaluation and to have her urine checked. Patient scheduled to come in at the next provoders open schen.

## 2020-01-23 ENCOUNTER — Other Ambulatory Visit: Payer: Self-pay

## 2020-01-24 ENCOUNTER — Ambulatory Visit (INDEPENDENT_AMBULATORY_CARE_PROVIDER_SITE_OTHER): Payer: PPO | Admitting: Otolaryngology

## 2020-01-24 ENCOUNTER — Encounter: Payer: Self-pay | Admitting: Family Medicine

## 2020-01-24 ENCOUNTER — Ambulatory Visit (INDEPENDENT_AMBULATORY_CARE_PROVIDER_SITE_OTHER): Payer: PPO | Admitting: Family Medicine

## 2020-01-24 ENCOUNTER — Ambulatory Visit (INDEPENDENT_AMBULATORY_CARE_PROVIDER_SITE_OTHER): Payer: PPO

## 2020-01-24 ENCOUNTER — Encounter (INDEPENDENT_AMBULATORY_CARE_PROVIDER_SITE_OTHER): Payer: Self-pay | Admitting: Otolaryngology

## 2020-01-24 ENCOUNTER — Other Ambulatory Visit: Payer: Self-pay | Admitting: Family Medicine

## 2020-01-24 VITALS — Temp 97.2°F

## 2020-01-24 VITALS — BP 132/84 | HR 101 | Temp 97.4°F | Ht 64.0 in | Wt 294.6 lb

## 2020-01-24 DIAGNOSIS — Z8701 Personal history of pneumonia (recurrent): Secondary | ICD-10-CM | POA: Diagnosis not present

## 2020-01-24 DIAGNOSIS — R490 Dysphonia: Secondary | ICD-10-CM

## 2020-01-24 DIAGNOSIS — R0602 Shortness of breath: Secondary | ICD-10-CM | POA: Diagnosis not present

## 2020-01-24 DIAGNOSIS — R399 Unspecified symptoms and signs involving the genitourinary system: Secondary | ICD-10-CM

## 2020-01-24 DIAGNOSIS — J310 Chronic rhinitis: Secondary | ICD-10-CM | POA: Diagnosis not present

## 2020-01-24 LAB — POCT URINALYSIS DIPSTICK
Bilirubin, UA: NEGATIVE
Glucose, UA: POSITIVE — AB
Ketones, UA: NEGATIVE
Nitrite, UA: POSITIVE
Protein, UA: NEGATIVE
Spec Grav, UA: 1.015 (ref 1.010–1.025)
Urobilinogen, UA: 0.2 E.U./dL
pH, UA: 6 (ref 5.0–8.0)

## 2020-01-24 MED ORDER — FLOVENT HFA 44 MCG/ACT IN AERO
2.0000 | INHALATION_SPRAY | Freq: Two times a day (BID) | RESPIRATORY_TRACT | 1 refills | Status: DC
Start: 2020-01-24 — End: 2020-05-01

## 2020-01-24 MED ORDER — FLUCONAZOLE 150 MG PO TABS: 150.0000 mg | ORAL_TABLET | Freq: Once | ORAL | 0 refills | Status: AC

## 2020-01-24 MED ORDER — NITROFURANTOIN MONOHYD MACRO 100 MG PO CAPS: 100.0000 mg | ORAL_CAPSULE | Freq: Two times a day (BID) | ORAL | 0 refills | Status: AC

## 2020-01-24 NOTE — Progress Notes (Signed)
[name] is a 49 y.o. female  Chief Complaint  Patient presents with  . Dysuria    Patient is here today C/O a possible UTI.pt reported burning and frequency since Tuesday. Pt mentioned getting a Xray to make pnuemonia is gone per [place]-been 3 weeks still no voice    HPI:[name] is a 49 y.o. female patient of Dr. Ethelene Hal who complains of 3 day h/o dysuria and urinary frequency. + urgency. No gross hematuria.  Fever - no Chills - no Nausea/vomiting - no Back pain - no Lower abdominal pain - no  Pt requests CXR today. She states PCP advised her to have one. She was treated for pneumonia in 12/2019, xray done 12/30/19 showed no evidence of pneumonia but pt would very much like to have xray done.   Past Medical History:  Diagnosis Date  . ADHD (attention deficit hyperactivity disorder)   . Anemia    with pregnancy  . Anxiety   . Arthritis   . Bipolar disorder ([place])   . Chronic back pain greater than 3 months duration   . Depression   . Dysuria    has to have self in and out cath   . Eroded bladder suspension mesh ([place])   . Hepatitis    C  . Hypothyroidism   . Leg pain, bilateral   . Obsessive-compulsive disorder   . Rosacea   . Sciatic nerve pain   . Social anxiety disorder   . Thyroid disease     Past Surgical History:  Procedure Laterality Date  . BACK SURGERY     total of 6 back surgeries  . bladder mesh  2009  . DILITATION & CURRETTAGE/HYSTROSCOPY WITH NOVASURE ABLATION N/A 08/19/2016   Procedure: DILATATION & CURETTAGE WITH NOVASURE ABLATION;  Surgeon: [name], MD;  Location: [facility];  Service: Gynecology;  Laterality: N/A;  . EUS N/A 09/09/2015   Procedure: UPPER ENDOSCOPIC ULTRASOUND (EUS) RADIAL;  Surgeon: [name], MD;  Location: [facility];  Service: Endoscopy;  Laterality: N/A;  . EYE SURGERY    . INCONTINENCE SURGERY    . SPINAL FUSION  2008   times two    Social History   Socioeconomic History  . Marital status: Married    Spouse name: Not on file  . Number of children: Not on file  . Years of education: Not on file  . Highest education level: Not on file  Occupational History  . Not on file  Tobacco Use  . Smoking status: Former Smoker    Packs/day: 1.00    Types: Cigarettes    Quit date: 02/09/2016    Years since quitting: 3.9  . Smokeless tobacco: Never Used  . Tobacco comment: use vapor  Substance and Sexual Activity  . Alcohol use: No  . Drug use: No  . Sexual activity: Not on file  Other Topics Concern  . Not on file  Social History Narrative  . Not on file   Social Determinants of Health   Financial Resource Strain:   . Difficulty of Paying Living Expenses:   Food Insecurity:   . Worried About Charity fundraiser in the Last Year:   . Arboriculturist in the Last Year:   Transportation Needs:   . Film/video editor (Medical):   Marland Kitchen Lack of Transportation (Non-Medical):   Physical Activity:   . Days of Exercise per Week:   . Minutes of Exercise per Session:   Stress:   .  Feeling of Stress :   Social Connections:   . Frequency of Communication with Friends and Family:   . Frequency of Social Gatherings with Friends and Family:   . Attends Religious Services:   . Active Member of Clubs or Organizations:   . Attends Archivist Meetings:   Marland Kitchen Marital Status:   Intimate Partner Violence:   . Fear of Current or Ex-Partner:   . Emotionally Abused:   Marland Kitchen Physically Abused:   . Sexually Abused:     Family History  Problem Relation Age of Onset  . Schizophrenia Father   . Schizophrenia Cousin      Immunization History  Administered Date(s) Administered  . Hepatitis A, Ped/Adol-2 Dose 06/17/2016, 12/22/2016  . Hepatitis B, adult 06/17/2016, 08/02/2016, 12/22/2016  . Influenza-Unspecified 06/07/2016  . Pneumococcal Polysaccharide-23 10/21/2016  . Tdap 10/15/2008, 02/08/2019    Outpatient Encounter Medications as of 01/24/2020  Medication Sig  . albuterol (PROVENTIL)  (2.5 MG/3ML) 0.083% nebulizer solution Take 3 mLs (2.5 mg total) by nebulization every 6 (six) hours as needed for wheezing or shortness of breath.  Marland Kitchen albuterol (VENTOLIN HFA) 108 (90 Base) MCG/ACT inhaler Inhale 2 puffs into the lungs every 4 (four) hours as needed for wheezing or shortness of breath.  . ALPRAZolam (XANAX) 0.5 MG tablet Take 0.5 mg by mouth 2 (two) times daily as needed.  . ARIPiprazole (ABILIFY) 5 MG tablet Take 1 tablet (5 mg total) by mouth daily.  Marland Kitchen atorvastatin (LIPITOR) 20 MG tablet Take 1 tablet (20 mg total) by mouth daily.  Marland Kitchen azithromycin (ZITHROMAX) 250 MG tablet Take 1 tablet (250 mg total) by mouth daily. Take first 2 tablets together, then 1 every day until finished.  . benzonatate (TESSALON) 100 MG capsule Take 1 capsule (100 mg total) by mouth every 8 (eight) hours.  . cephALEXin (KEFLEX) 500 MG capsule Take 1 capsule (500 mg total) by mouth 3 (three) times daily.  . chlorpheniramine-HYDROcodone (TUSSIONEX PENNKINETIC ER) 10-8 MG/5ML SUER Take 5 mLs by mouth every 12 (twelve) hours as needed for cough.  . clomiPRAMINE (ANAFRANIL) 75 MG capsule Take 75 mg by mouth at bedtime.  Marland Kitchen doxycycline (VIBRAMYCIN) 100 MG capsule Take 1 capsule (100 mg total) by mouth 2 (two) times daily.  Marland Kitchen gabapentin (NEURONTIN) 300 MG capsule TAKE 1 CAPSULE(300 MG) BY MOUTH THREE TIMES DAILY AS NEEDED  . lamoTRIgine (LAMICTAL) 100 MG tablet Take 1 tablet (100 mg total) by mouth daily. (Patient taking differently: Take 150 mg by mouth daily. )  . levothyroxine (SYNTHROID) 75 MCG tablet TAKE 1 TABLET(75 MCG) BY MOUTH DAILY  . lisinopril (ZESTRIL) 10 MG tablet TAKE 1 TABLET(10 MG) BY MOUTH DAILY  . methylphenidate (RITALIN) 10 MG tablet Take 1 tablet by mouth 2 (two) times daily.  . nabumetone (RELAFEN) 750 MG tablet Take 1 tablet (750 mg total) by mouth 2 (two) times daily as needed.  . predniSONE (DELTASONE) 20 MG tablet Take 2 tablets (40 mg total) by mouth daily with breakfast.   No  facility-administered encounter medications on file as of 01/24/2020.     ROS: Pertinent positives and negatives noted in HPI. Remainder of ROS non-contributory   Allergies  Allergen Reactions  . Pregabalin Anaphylaxis  . Celebrex [Celecoxib] Nausea And Vomiting    Stomach pain  . Effexor [Venlafaxine Hcl] Other (See Comments)    Serotonin toxicity  . Mirapex [Pramipexole]     Mirapex- Worsening "body jerks"  . Pristiq [Desvenlafaxine] Other (See Comments)    Serotonin  toxicity   . Prozac [Fluoxetine Hcl] Swelling  . Symbyax [Olanzapine-Fluoxetine Hcl] Swelling  . Wellbutrin [Bupropion Hcl] Swelling    BP 132/84 (BP Location: Left Arm, Patient Position: Sitting, Cuff Size: Large)   Pulse (!) 101   Temp (!) 97.4 F (36.3 C) (Tympanic)   Ht 5\' 4"  (1.626 m)   Wt 294 lb 9.6 oz (133.6 kg)   SpO2 97%   BMI 50.57 kg/m   Physical Exam  Constitutional: She is oriented to person, place, and time. She appears well-developed and well-nourished. No distress.  Pulmonary/Chest: Breath sounds normal. No respiratory distress.  Abdominal: Soft. There is no abdominal tenderness. There is no CVA tenderness.  Neurological: She is alert and oriented to person, place, and time.  Psychiatric: She has a normal mood and affect. Her behavior is normal.   Component     Latest Ref Rng & Units 01/24/2020  Color, UA      dark yellow  Clarity, UA      cloudy  Glucose     Negative Positive (A)  Bilirubin, UA      negative  Ketones, UA      negative  Specific Gravity, UA     1.010 - 1.025 1.015  RBC, UA      3+  pH, UA     5.0 - 8.0 6.0  Protein,UA     Negative Negative  Urobilinogen, UA     0.2 or 1.0 E.U./dL 0.2  Nitrite, UA      positive  Leukocytes,UA     Negative Moderate (2+) (A)  Appearance     CLEAR     A/P:  1. UTI symptoms - POCT urinalysis dipstick - Urine Culture Rx: - nitrofurantoin, macrocrystal-monohydrate, (MACROBID) 100 MG capsule; Take 1 capsule (100 mg  total) by mouth 2 (two) times daily for 7 days.  Dispense: 14 capsule; Refill: 0 - f/u if symptoms worsen or do not improve in 1 week   2. History of pneumonia - pt treated for pneumonia in 12/2019 - DG Chest 2 View; Future    This visit occurred during the SARS-CoV-2 public health emergency.  Safety protocols were in place, including screening questions prior to the visit, additional usage of staff PPE, and extensive cleaning of exam room while observing appropriate contact time as indicated for disinfecting solutions.

## 2020-01-24 NOTE — Progress Notes (Signed)
HPI: [name] is a 49 y.o. female who presents is referred by Dr. Ethelene Hal her PCP for evaluation of hoarseness and laryngitis.  Patient apparently quit smoking in February and had previously smoked about a pack a day.  In April she developed pneumonia and bronchitis and states that she has had a weak voice since that time.  She was never intubated but had a lot of coughing associated with bronchitis..  She had recently got her second dose of vaccine the first part of April. She has a raspy voice in the office today.  She is not having any coughing in the office today.  Past Medical History:  Diagnosis Date  . ADHD (attention deficit hyperactivity disorder)   . Anemia    with pregnancy  . Anxiety   . Arthritis   . Bipolar disorder ([place])   . Chronic back pain greater than 3 months duration   . Depression   . Dysuria    has to have self in and out cath   . Eroded bladder suspension mesh ([place])   . Hepatitis    C  . Hypothyroidism   . Leg pain, bilateral   . Obsessive-compulsive disorder   . Rosacea   . Sciatic nerve pain   . Social anxiety disorder   . Thyroid disease    Past Surgical History:  Procedure Laterality Date  . BACK SURGERY     total of 6 back surgeries  . bladder mesh  2009  . DILITATION & CURRETTAGE/HYSTROSCOPY WITH NOVASURE ABLATION N/A 08/19/2016   Procedure: DILATATION & CURETTAGE WITH NOVASURE ABLATION;  Surgeon: [name], MD;  Location: [facility];  Service: Gynecology;  Laterality: N/A;  . EUS N/A 09/09/2015   Procedure: UPPER ENDOSCOPIC ULTRASOUND (EUS) RADIAL;  Surgeon: [name], MD;  Location: [facility];  Service: Endoscopy;  Laterality: N/A;  . EYE SURGERY    . INCONTINENCE SURGERY    . SPINAL FUSION  2008   times two   Social History   Socioeconomic History  . Marital status: Married    Spouse name: Not on file  . Number of children: Not on file  . Years of education: Not on file  . Highest education level: Not on file  Occupational  History  . Not on file  Tobacco Use  . Smoking status: Former Smoker    Packs/day: 1.00    Years: 10.00    Pack years: 10.00    Types: Cigarettes    Quit date: 02/09/2016    Years since quitting: 3.9  . Smokeless tobacco: Never Used  . Tobacco comment: use vapor  Substance and Sexual Activity  . Alcohol use: No  . Drug use: No  . Sexual activity: Not on file  Other Topics Concern  . Not on file  Social History Narrative  . Not on file   Social Determinants of Health   Financial Resource Strain:   . Difficulty of Paying Living Expenses:   Food Insecurity:   . Worried About Charity fundraiser in the Last Year:   . Arboriculturist in the Last Year:   Transportation Needs:   . Film/video editor (Medical):   Marland Kitchen Lack of Transportation (Non-Medical):   Physical Activity:   . Days of Exercise per Week:   . Minutes of Exercise per Session:   Stress:   . Feeling of Stress :   Social Connections:   . Frequency of Communication with Friends and Family:   .  Frequency of Social Gatherings with Friends and Family:   . Attends Religious Services:   . Active Member of Clubs or Organizations:   . Attends Archivist Meetings:   Marland Kitchen Marital Status:    Family History  Problem Relation Age of Onset  . Schizophrenia Father   . Schizophrenia Cousin    Allergies  Allergen Reactions  . Pregabalin Anaphylaxis  . Celebrex [Celecoxib] Nausea And Vomiting    Stomach pain  . Effexor [Venlafaxine Hcl] Other (See Comments)    Serotonin toxicity  . Mirapex [Pramipexole]     Mirapex- Worsening "body jerks"  . Pristiq [Desvenlafaxine] Other (See Comments)    Serotonin toxicity   . Prozac [Fluoxetine Hcl] Swelling  . Symbyax [Olanzapine-Fluoxetine Hcl] Swelling  . Wellbutrin [Bupropion Hcl] Swelling   Prior to Admission medications   Medication Sig Start Date End Date Taking? Authorizing Provider  albuterol (PROVENTIL) (2.5 MG/3ML) 0.083% nebulizer solution Take 3 mLs (2.5 mg  total) by nebulization every 6 (six) hours as needed for wheezing or shortness of breath. 12/30/19  Yes Horton, Barbette Hair, MD  albuterol (VENTOLIN HFA) 108 (90 Base) MCG/ACT inhaler Inhale 2 puffs into the lungs every 4 (four) hours as needed for wheezing or shortness of breath. 12/24/19  Yes Horton, Barbette Hair, MD  ALPRAZolam Duanne Moron) 0.5 MG tablet Take 0.5 mg by mouth 2 (two) times daily as needed. 12/20/19  Yes [provider]  ARIPiprazole (ABILIFY) 5 MG tablet Take 1 tablet (5 mg total) by mouth daily. 09/17/15  Yes Clarene Reamer, MD  atorvastatin (LIPITOR) 20 MG tablet Take 1 tablet (20 mg total) by mouth daily. 08/22/19  Yes Libby Maw, MD  azithromycin (ZITHROMAX) 250 MG tablet Take 1 tablet (250 mg total) by mouth daily. Take first 2 tablets together, then 1 every day until finished. 12/26/19  Yes Drenda Freeze, MD  benzonatate (TESSALON) 100 MG capsule Take 1 capsule (100 mg total) by mouth every 8 (eight) hours. 12/30/19  Yes Horton, Barbette Hair, MD  cephALEXin (KEFLEX) 500 MG capsule Take 1 capsule (500 mg total) by mouth 3 (three) times daily. 12/26/19  Yes Drenda Freeze, MD  chlorpheniramine-HYDROcodone [facility]) 10-8 MG/5ML SUER Take 5 mLs by mouth every 12 (twelve) hours as needed for cough. 12/26/19  Yes Drenda Freeze, MD  clomiPRAMINE (ANAFRANIL) 75 MG capsule Take 75 mg by mouth at bedtime.   Yes [provider]  doxycycline (VIBRAMYCIN) 100 MG capsule Take 1 capsule (100 mg total) by mouth 2 (two) times daily. 12/24/19  Yes Horton, Barbette Hair, MD  fluconazole (DIFLUCAN) 150 MG tablet Take 1 tablet (150 mg total) by mouth once for 1 dose. 01/24/20 01/24/20 Yes Cirigliano, Mary K, DO  gabapentin (NEURONTIN) 300 MG capsule TAKE 1 CAPSULE(300 MG) BY MOUTH THREE TIMES DAILY AS NEEDED 12/23/19  Yes Libby Maw, MD  lamoTRIgine (LAMICTAL) 100 MG tablet Take 1 tablet (100 mg total) by mouth daily. Patient taking differently:  Take 150 mg by mouth daily.  09/17/15  Yes Clarene Reamer, MD  levothyroxine (SYNTHROID) 75 MCG tablet TAKE 1 TABLET(75 MCG) BY MOUTH DAILY 01/18/20  Yes Libby Maw, MD  lisinopril (ZESTRIL) 10 MG tablet TAKE 1 TABLET(10 MG) BY MOUTH DAILY 11/08/19  Yes Libby Maw, MD  methylphenidate (RITALIN) 10 MG tablet Take 1 tablet by mouth 2 (two) times daily. 08/26/18  Yes [provider]  nabumetone (RELAFEN) 750 MG tablet Take 1 tablet (750 mg total) by  mouth 2 (two) times daily as needed. 05/28/19  Yes Hilts, Legrand Como, MD  nitrofurantoin, macrocrystal-monohydrate, (MACROBID) 100 MG capsule Take 1 capsule (100 mg total) by mouth 2 (two) times daily for 7 days. 01/24/20 01/31/20 Yes Cirigliano, Mary K, DO  fluticasone (FLOVENT HFA) 44 MCG/ACT inhaler Inhale 2 puffs into the lungs in the morning and at bedtime. 01/24/20   Cirigliano, Garvin Fila, DO     Positive ROS: Otherwise negative  All other systems have been reviewed and were otherwise negative with the exception of those mentioned in the HPI and as above.  Physical Exam: Constitutional: Alert, well-appearing, no acute distress Ears: External ears without lesions or tenderness. Ear canals are clear bilaterally with intact, clear TMs bilaterally. Nasal: External nose without lesions. Septum midline with mild rhinitis.  Both middle meatus regions were clear with no signs of acute infection or mucopurulent discharge.  Posterior nasal cavity is clear bilaterally. Fiberoptic laryngoscopy was performed to the right nostril.  On fiberoptic laryngoscopy the nasopharynx was clear.  The base of tongue vallecula and epiglottis were normal.  On evaluation of vocal cords patient had a white nodular lesion on the left anterior true vocal cord Elvis appears to represent granulation tissue or form trauma possibly secondary to coughing.  Vocal cords had normal mobility otherwise. Oral: Lips and gums without lesions. Tongue and palate mucosa  without lesions. Posterior oropharynx clear. Neck: No palpable adenopathy or masses.  There is no palpable adenopathy on either side of the neck. Respiratory: Breathing comfortably  Skin: No facial/neck lesions or rash noted.  Laryngoscopy  Date/Time: 01/24/2020 5:16 PM Performed by: Rozetta Nunnery, MD Authorized by: Rozetta Nunnery, MD   Consent:    Consent obtained:  Verbal   Consent given by:  Patient Procedure details:    Medication:  Afrin   Instrument: flexible fiberoptic laryngoscope     Scope location: right nare   Sinus:    Right nasopharynx: normal   Mouth:    Vallecula: normal     Base of tongue: normal     Epiglottis: normal   Comments:     On fiberoptic laryngoscopy patient has white nodule lesion on the left true vocal cord which I suspect is causing her hoarseness and weak voice.      Assessment: Hoarseness secondary to left vocal cord lesion. Mild rhinitis with no clinical signs of acute sinus infection  Plan: Placed her on a Sterapred 10 mg 6-day Dosepak.  Recommended resting her voice for the next month. She will follow up in 5 to 6 weeks for recheck and repeat laryngoscopy.   Radene Journey, MD   CC:

## 2020-01-26 LAB — URINE CULTURE
MICRO NUMBER:: 10506156
SPECIMEN QUALITY:: ADEQUATE

## 2020-01-27 ENCOUNTER — Ambulatory Visit
Admission: RE | Admit: 2020-01-27 | Discharge: 2020-01-27 | Disposition: A | Discharge status: Home / Self Care | Payer: PPO | Source: Ambulatory Visit | Attending: Family Medicine | Admitting: Family Medicine

## 2020-01-27 ENCOUNTER — Other Ambulatory Visit: Payer: Self-pay

## 2020-01-27 DIAGNOSIS — Z1231 Encounter for screening mammogram for malignant neoplasm of breast: Secondary | ICD-10-CM

## 2020-01-28 ENCOUNTER — Telehealth: Payer: Self-pay | Admitting: Family Medicine

## 2020-01-28 NOTE — Telephone Encounter (Signed)
Spoke with patient who verbally understood xray results. Per patient she went to her appointment with ENT and they started her on Prednisone and informed her that she has a lesion on her vocal tubes if prednisone does not help in 5 weeks she will need to have surgically removed.

## 2020-01-28 NOTE — Telephone Encounter (Signed)
Called patient to go over Xray results, no answer LMTCB

## 2020-01-28 NOTE — Telephone Encounter (Signed)
Patient is calling back and wanted to see if xray results were back. CB is [phone]

## 2020-01-28 NOTE — Telephone Encounter (Signed)
Patient is returning the call. CB is [phone].

## 2020-01-29 NOTE — Telephone Encounter (Signed)
Duplicate message. 

## 2020-02-04 DIAGNOSIS — L918 Other hypertrophic disorders of the skin: Secondary | ICD-10-CM | POA: Diagnosis not present

## 2020-02-04 DIAGNOSIS — L814 Other melanin hyperpigmentation: Secondary | ICD-10-CM | POA: Diagnosis not present

## 2020-02-04 DIAGNOSIS — L738 Other specified follicular disorders: Secondary | ICD-10-CM | POA: Diagnosis not present

## 2020-02-04 DIAGNOSIS — L718 Other rosacea: Secondary | ICD-10-CM | POA: Diagnosis not present

## 2020-02-04 DIAGNOSIS — L821 Other seborrheic keratosis: Secondary | ICD-10-CM | POA: Diagnosis not present

## 2020-02-10 ENCOUNTER — Other Ambulatory Visit: Payer: Self-pay | Admitting: Family Medicine

## 2020-02-10 DIAGNOSIS — E7800 Pure hypercholesterolemia, unspecified: Secondary | ICD-10-CM

## 2020-02-11 ENCOUNTER — Telehealth: Payer: Self-pay | Admitting: Family Medicine

## 2020-02-11 DIAGNOSIS — Z01419 Encounter for gynecological examination (general) (routine) without abnormal findings: Secondary | ICD-10-CM

## 2020-02-11 NOTE — Telephone Encounter (Signed)
Patient called to schedule annual pap smear, as discussed in her last appt with Dr. Ethelene Hal. Please add referral for service.

## 2020-02-12 NOTE — Telephone Encounter (Signed)
WK-Pt is needing referral for GYN for annual PAP/thx dmf

## 2020-02-13 NOTE — Telephone Encounter (Signed)
Pt aware referral created and will receive a call to schedule/thx dmf

## 2020-02-26 ENCOUNTER — Ambulatory Visit (INDEPENDENT_AMBULATORY_CARE_PROVIDER_SITE_OTHER): Payer: PPO | Admitting: Otolaryngology

## 2020-02-27 ENCOUNTER — Ambulatory Visit (INDEPENDENT_AMBULATORY_CARE_PROVIDER_SITE_OTHER): Payer: PPO | Admitting: Otolaryngology

## 2020-02-27 ENCOUNTER — Encounter (INDEPENDENT_AMBULATORY_CARE_PROVIDER_SITE_OTHER): Payer: Self-pay | Admitting: Otolaryngology

## 2020-02-27 ENCOUNTER — Other Ambulatory Visit: Payer: Self-pay

## 2020-02-27 VITALS — Temp 97.5°F

## 2020-02-27 DIAGNOSIS — M26629 Arthralgia of temporomandibular joint, unspecified side: Secondary | ICD-10-CM | POA: Diagnosis not present

## 2020-02-27 DIAGNOSIS — R490 Dysphonia: Secondary | ICD-10-CM | POA: Diagnosis not present

## 2020-02-27 NOTE — Progress Notes (Signed)
HPI: [name] is a 49 y.o. female who returns today for evaluation of left vocal cord nodule that was noted 5 weeks ago on exam in the office because of chronic hoarseness and weak voice.  She was treated with a course of steroids.  Her voice is doing much better today. She does complain of some intermittent right ear discomfort especially at night when she sleeps.  Past Medical History:  Diagnosis Date  . ADHD (attention deficit hyperactivity disorder)   . Anemia    with pregnancy  . Anxiety   . Arthritis   . Bipolar disorder ([place])   . Chronic back pain greater than 3 months duration   . Depression   . Dysuria    has to have self in and out cath   . Eroded bladder suspension mesh ([place])   . Hepatitis    C  . Hypothyroidism   . Leg pain, bilateral   . Obsessive-compulsive disorder   . Rosacea   . Sciatic nerve pain   . Social anxiety disorder   . Thyroid disease    Past Surgical History:  Procedure Laterality Date  . BACK SURGERY     total of 6 back surgeries  . bladder mesh  2009  . DILITATION & CURRETTAGE/HYSTROSCOPY WITH NOVASURE ABLATION N/A 08/19/2016   Procedure: DILATATION & CURETTAGE WITH NOVASURE ABLATION;  Surgeon: [name], MD;  Location: [facility];  Service: Gynecology;  Laterality: N/A;  . EUS N/A 09/09/2015   Procedure: UPPER ENDOSCOPIC ULTRASOUND (EUS) RADIAL;  Surgeon: [name], MD;  Location: [facility];  Service: Endoscopy;  Laterality: N/A;  . EYE SURGERY    . INCONTINENCE SURGERY    . SPINAL FUSION  2008   times two   Social History   Socioeconomic History  . Marital status: Married    Spouse name: Not on file  . Number of children: Not on file  . Years of education: Not on file  . Highest education level: Not on file  Occupational History  . Not on file  Tobacco Use  . Smoking status: Former Smoker    Packs/day: 1.00    Years: 10.00    Pack years: 10.00    Types: Cigarettes    Quit date: 02/09/2016    Years since quitting: 4.0   . Smokeless tobacco: Never Used  . Tobacco comment: use vapor  Vaping Use  . Vaping Use: Never used  Substance and Sexual Activity  . Alcohol use: No  . Drug use: No  . Sexual activity: Not on file  Other Topics Concern  . Not on file  Social History Narrative  . Not on file   Social Determinants of Health   Financial Resource Strain:   . Difficulty of Paying Living Expenses:   Food Insecurity:   . Worried About Charity fundraiser in the Last Year:   . Arboriculturist in the Last Year:   Transportation Needs:   . Film/video editor (Medical):   Marland Kitchen Lack of Transportation (Non-Medical):   Physical Activity:   . Days of Exercise per Week:   . Minutes of Exercise per Session:   Stress:   . Feeling of Stress :   Social Connections:   . Frequency of Communication with Friends and Family:   . Frequency of Social Gatherings with Friends and Family:   . Attends Religious Services:   . Active Member of Clubs or Organizations:   . Attends Archivist Meetings:   .  Marital Status:    Family History  Problem Relation Age of Onset  . Schizophrenia Father   . Schizophrenia Cousin    Allergies  Allergen Reactions  . Pregabalin Anaphylaxis  . Celebrex [Celecoxib] Nausea And Vomiting    Stomach pain  . Effexor [Venlafaxine Hcl] Other (See Comments)    Serotonin toxicity  . Mirapex [Pramipexole]     Mirapex- Worsening "body jerks"  . Pristiq [Desvenlafaxine] Other (See Comments)    Serotonin toxicity   . Prozac [Fluoxetine Hcl] Swelling  . Symbyax [Olanzapine-Fluoxetine Hcl] Swelling  . Wellbutrin [Bupropion Hcl] Swelling   Prior to Admission medications   Medication Sig Start Date End Date Taking? Authorizing Provider  albuterol (PROVENTIL) (2.5 MG/3ML) 0.083% nebulizer solution Take 3 mLs (2.5 mg total) by nebulization every 6 (six) hours as needed for wheezing or shortness of breath. 12/30/19  Yes Horton, Barbette Hair, MD  albuterol (VENTOLIN HFA) 108 (90 Base)  MCG/ACT inhaler Inhale 2 puffs into the lungs every 4 (four) hours as needed for wheezing or shortness of breath. 12/24/19  Yes Horton, Barbette Hair, MD  ALPRAZolam Duanne Moron) 0.5 MG tablet Take 0.5 mg by mouth 2 (two) times daily as needed. 12/20/19  Yes [provider]  ARIPiprazole (ABILIFY) 5 MG tablet Take 1 tablet (5 mg total) by mouth daily. 09/17/15  Yes Clarene Reamer, MD  atorvastatin (LIPITOR) 20 MG tablet TAKE 1 TABLET(20 MG) BY MOUTH DAILY 02/11/20  Yes Libby Maw, MD  azithromycin (ZITHROMAX) 250 MG tablet Take 1 tablet (250 mg total) by mouth daily. Take first 2 tablets together, then 1 every day until finished. 12/26/19  Yes Drenda Freeze, MD  benzonatate (TESSALON) 100 MG capsule Take 1 capsule (100 mg total) by mouth every 8 (eight) hours. 12/30/19  Yes Horton, Barbette Hair, MD  cephALEXin (KEFLEX) 500 MG capsule Take 1 capsule (500 mg total) by mouth 3 (three) times daily. 12/26/19  Yes Drenda Freeze, MD  chlorpheniramine-HYDROcodone [facility] ER) 10-8 MG/5ML SUER Take 5 mLs by mouth every 12 (twelve) hours as needed for cough. 12/26/19  Yes Drenda Freeze, MD  clomiPRAMINE (ANAFRANIL) 75 MG capsule Take 75 mg by mouth at bedtime.   Yes [provider]  doxycycline (VIBRAMYCIN) 100 MG capsule Take 1 capsule (100 mg total) by mouth 2 (two) times daily. 12/24/19  Yes Horton, Barbette Hair, MD  fluticasone (FLOVENT HFA) 44 MCG/ACT inhaler Inhale 2 puffs into the lungs in the morning and at bedtime. 01/24/20  Yes Cirigliano, Mary K, DO  gabapentin (NEURONTIN) 300 MG capsule TAKE 1 CAPSULE(300 MG) BY MOUTH THREE TIMES DAILY AS NEEDED 12/23/19  Yes Libby Maw, MD  lamoTRIgine (LAMICTAL) 100 MG tablet Take 1 tablet (100 mg total) by mouth daily. Patient taking differently: Take 150 mg by mouth daily.  09/17/15  Yes Clarene Reamer, MD  levothyroxine (SYNTHROID) 75 MCG tablet TAKE 1 TABLET(75 MCG) BY MOUTH DAILY 01/18/20  Yes Libby Maw, MD  lisinopril (ZESTRIL) 10 MG tablet TAKE 1 TABLET(10 MG) BY MOUTH DAILY 11/08/19  Yes Libby Maw, MD  methylphenidate (RITALIN) 10 MG tablet Take 1 tablet by mouth 2 (two) times daily. 08/26/18  Yes [provider]  nabumetone (RELAFEN) 750 MG tablet Take 1 tablet (750 mg total) by mouth 2 (two) times daily as needed. 05/28/19  Yes Hilts, Legrand Como, MD     Positive ROS: Otherwise negative  All other systems have been reviewed and were otherwise negative with the exception  of those mentioned in the HPI and as above.  Physical Exam: Constitutional: Alert, well-appearing, no acute distress Ears: External ears without lesions or tenderness.  She had a little bit of wax buildup in the ears but this was minimal and was cleaned with curettes bilaterally.  Both TMs were clear with good mobility on pneumatic otoscopy.  I suspect the right ear discomfort may be related to TMJ dysfunction as she does have a tendency to grind her teeth at night. Nasal: External nose without lesions. Septum midline with mild rhinitis.. Clear nasal passages Oral: Lips and gums without lesions. Tongue and palate mucosa without lesions. Posterior oropharynx clear. Fiberoptic laryngoscopy was performed through the right nostril.  Nasopharynx was clear.  Base of tongue vallecula and epiglottis were normal.  On evaluation of vocal cords the vocal cords were clear bilaterally with resolution of the left vocal cord nodule.  Vocal cords had symmetric mobility. Neck: No palpable adenopathy or masses Respiratory: Breathing comfortably  Skin: No facial/neck lesions or rash noted.  Laryngoscopy  Date/Time: 02/27/2020 11:03 AM Performed by: Rozetta Nunnery, MD Authorized by: Rozetta Nunnery, MD   Consent:    Consent obtained:  Verbal   Consent given by:  Patient Procedure details:    Indications: direct visualization of the upper aerodigestive tract     Medication:  Afrin   Instrument:  flexible fiberoptic laryngoscope     Scope location: right nare   Sinus:    Right nasopharynx: normal   Mouth:    Oropharynx: normal     Vallecula: normal     Base of tongue: normal     Epiglottis: normal   Throat:    True vocal cords: normal   Comments:     On fiberoptic laryngoscopy vocal cords were clear bilaterally with normal vocal cord mobility.  She has resolution of the left vocal cord nodule.    Assessment: Resolution of left vocal cord nodule. I suspect her right ear discomfort is related to TMJ dysfunction and reviewed this with her.  Plan: She will follow-up if she notices any hoarseness or change in her voice.  Otherwise will follow-up as needed.   Radene Journey, MD

## 2020-03-05 DIAGNOSIS — F3132 Bipolar disorder, current episode depressed, moderate: Secondary | ICD-10-CM | POA: Diagnosis not present

## 2020-03-05 DIAGNOSIS — F411 Generalized anxiety disorder: Secondary | ICD-10-CM | POA: Diagnosis not present

## 2020-03-05 DIAGNOSIS — F429 Obsessive-compulsive disorder, unspecified: Secondary | ICD-10-CM | POA: Diagnosis not present

## 2020-03-13 ENCOUNTER — Encounter: Payer: Self-pay | Admitting: Family Medicine

## 2020-03-13 ENCOUNTER — Ambulatory Visit (INDEPENDENT_AMBULATORY_CARE_PROVIDER_SITE_OTHER): Payer: PPO | Admitting: Family Medicine

## 2020-03-13 ENCOUNTER — Other Ambulatory Visit: Payer: Self-pay

## 2020-03-13 ENCOUNTER — Ambulatory Visit: Payer: Self-pay

## 2020-03-13 DIAGNOSIS — Z6841 Body Mass Index (BMI) 40.0 and over, adult: Secondary | ICD-10-CM

## 2020-03-13 DIAGNOSIS — M1612 Unilateral primary osteoarthritis, left hip: Secondary | ICD-10-CM

## 2020-03-13 DIAGNOSIS — M25552 Pain in left hip: Secondary | ICD-10-CM

## 2020-03-13 NOTE — Progress Notes (Signed)
Office Visit Note   Patient: [name]           Date of Birth: 06-Jan-1971           MRN: [id number] Visit Date: 03/13/2020 Requested by: Libby Maw, [address] [place],  [place] [address] PCP: Libby Maw, MD  Subjective: Chief Complaint  Patient presents with  . left groin pain, worse, radiates to knee    HPI: She is here with persistent left hip pain.  Injections are only giving temporary relief at best, but she has been unable to lose weight in order to proceed with hip replacement.  Although the relief is minimal, she would like to have another injection if possible.  She is supposed to see a weight loss clinic through the [facility] program, but would prefer to do it locally if possible.  She also would like referral to a pain clinic.  This helped her in the past with spine issues.  She is hoping it will give her some relief while she is working on weight loss.              ROS:   All other systems were reviewed and are negative.  Objective: Vital Signs: There were no vitals taken for this visit.  Physical Exam:  General:  Alert and oriented, in no acute distress. Pulm:  Breathing unlabored. Psy:  Normal mood, congruent affect.  Left hip: She has pain with internal rotation with limited range of motion.  Imaging: US Guided Needle Placement - No Linked Charges  Result Date: 03/13/2020 Ultrasound-guided left hip injection: After sterile prep with Betadine, injected 8 cc 1% lidocaine without epinephrine and 40 mg methylprednisolone using a 22-gauge spinal needle, passing the needle through the iliofemoral ligament into the femoral head/neck junction.  Modest immediate improvement.     Assessment & Plan: 1.  End-stage left hip DJD. -Injection given as above.  Pain clinic referral.  2.  Morbid obesity -Referral to weight loss management.     Procedures: No procedures performed  No notes on file     PMFS History: Patient  Active Problem List   Diagnosis Date Noted  . COVID-19 07/09/2019  . Left hip pain 04/30/2019  . Otitis externa of left ear 04/30/2019  . Cholesteatoma of left ear 04/30/2019  . Dysfunction of left eustachian tube 04/30/2019  . Left ear pain 04/30/2019  . Sciatica 04/19/2019  . Class 3 severe obesity due to excess calories with serious comorbidity and body mass index (BMI) of 45.0 to 49.9 in adult ([place]) 04/19/2019  . Post-nasal drip 01/10/2019  . Allergic cough 01/10/2019  . Obstructive sleep apnea syndrome 12/11/2018  . Acute frontal sinusitis 12/10/2018  . Primary osteoarthritis of left hip 11/13/2018  . Body mass index 45.0-49.9, adult ([place]) 11/13/2018  . Morbid obesity ([place]) 11/13/2018  . Elevated cholesterol 11/12/2018  . Essential hypertension 11/12/2018  . Allergic rhinitis due to pollen 11/12/2018  . Reactive airway disease 11/12/2018  . Breast pain, right 07/17/2018  . Prediabetes 07/17/2018  . Refused influenza vaccine 07/17/2018  . Chronic nonintractable headache 02/12/2018  . Dysuria 02/12/2018  . Localized edema 02/12/2018  . Hospital discharge follow-up 01/23/2018  . Bladder problem 01/05/2018  . Depression 01/05/2018  . Insomnia 01/05/2018  . Hepatitis C infection 12/01/2015  . RLS (restless legs syndrome) 10/23/2015  . Elevated liver enzymes 10/22/2015  . Recurrent major depression-severe ([place]) 04/17/2015  . GAD (generalized anxiety disorder) 04/15/2015  . Bipolar  depression ([place]) 04/15/2015  . Severe recurrent major depression w/psychotic features, mood-congruent ([place]) 04/14/2015  . Anxiety disorder 04/14/2015  . Major depressive disorder, recurrent severe without psychotic features ([place]) 04/14/2015  . Suicidal ideations   . Chronic LBP 01/02/2015  . Adult hypothyroidism 06/25/2012  . Cobalamin deficiency 06/25/2012  . Fatigue 06/25/2012  . Chronic arthralgias of knees and hips 06/25/2012  . Leukocytosis 06/25/2012  . Vitamin D deficiency 06/25/2012    Past Medical History:  Diagnosis Date  . ADHD (attention deficit hyperactivity disorder)   . Anemia    with pregnancy  . Anxiety   . Arthritis   . Bipolar disorder ([place])   . Chronic back pain greater than 3 months duration   . Depression   . Dysuria    has to have self in and out cath   . Eroded bladder suspension mesh ([place])   . Hepatitis    C  . Hypothyroidism   . Leg pain, bilateral   . Obsessive-compulsive disorder   . Rosacea   . Sciatic nerve pain   . Social anxiety disorder   . Thyroid disease     Family History  Problem Relation Age of Onset  . Schizophrenia Father   . Schizophrenia Cousin     Past Surgical History:  Procedure Laterality Date  . BACK SURGERY     total of 6 back surgeries  . bladder mesh  2009  . DILITATION & CURRETTAGE/HYSTROSCOPY WITH NOVASURE ABLATION N/A 08/19/2016   Procedure: DILATATION & CURETTAGE WITH NOVASURE ABLATION;  Surgeon: [name], MD;  Location: [facility];  Service: Gynecology;  Laterality: N/A;  . EUS N/A 09/09/2015   Procedure: UPPER ENDOSCOPIC ULTRASOUND (EUS) RADIAL;  Surgeon: [name], MD;  Location: [facility];  Service: Endoscopy;  Laterality: N/A;  . EYE SURGERY    . INCONTINENCE SURGERY    . SPINAL FUSION  2008   times two   Social History   Occupational History  . Not on file  Tobacco Use  . Smoking status: Former Smoker    Packs/day: 1.00    Years: 10.00    Pack years: 10.00    Types: Cigarettes    Quit date: 02/09/2016    Years since quitting: 4.0  . Smokeless tobacco: Never Used  . Tobacco comment: use vapor  Vaping Use  . Vaping Use: Never used  Substance and Sexual Activity  . Alcohol use: No  . Drug use: No  . Sexual activity: Not on file

## 2020-03-18 DIAGNOSIS — M25552 Pain in left hip: Secondary | ICD-10-CM | POA: Diagnosis not present

## 2020-03-18 DIAGNOSIS — Z79899 Other long term (current) drug therapy: Secondary | ICD-10-CM | POA: Diagnosis not present

## 2020-03-18 DIAGNOSIS — I10 Essential (primary) hypertension: Secondary | ICD-10-CM | POA: Diagnosis not present

## 2020-03-24 ENCOUNTER — Telehealth: Payer: Self-pay | Admitting: Family Medicine

## 2020-03-24 DIAGNOSIS — M25552 Pain in left hip: Secondary | ICD-10-CM | POA: Diagnosis not present

## 2020-03-24 DIAGNOSIS — Z79899 Other long term (current) drug therapy: Secondary | ICD-10-CM | POA: Diagnosis not present

## 2020-03-24 DIAGNOSIS — I10 Essential (primary) hypertension: Secondary | ICD-10-CM | POA: Diagnosis not present

## 2020-03-24 NOTE — Telephone Encounter (Signed)
Patient is calling and stated that her BP was 140/86 today and wanted to see if Dr. Ethelene Hal can increase her lisinopril, please advise. CB is [phone]

## 2020-03-24 NOTE — Telephone Encounter (Signed)
Needs ov

## 2020-03-24 NOTE — Telephone Encounter (Signed)
Please advise message above. Patient not sure if BP medication should be increased. Please advise

## 2020-03-25 NOTE — Telephone Encounter (Signed)
Called to inform patient that she needs to come in for eval, no answer LMTCB and schedule appointment.

## 2020-03-30 ENCOUNTER — Telehealth (INDEPENDENT_AMBULATORY_CARE_PROVIDER_SITE_OTHER): Payer: PPO | Admitting: Family

## 2020-03-30 ENCOUNTER — Encounter: Payer: Self-pay | Admitting: Family

## 2020-03-30 DIAGNOSIS — I10 Essential (primary) hypertension: Secondary | ICD-10-CM

## 2020-03-30 DIAGNOSIS — R609 Edema, unspecified: Secondary | ICD-10-CM | POA: Diagnosis not present

## 2020-03-30 MED ORDER — FUROSEMIDE 20 MG PO TABS
20.0000 mg | ORAL_TABLET | Freq: Every day | ORAL | 0 refills | Status: DC
Start: 2020-03-30 — End: 2020-04-02

## 2020-03-30 NOTE — Progress Notes (Signed)
Virtual Visit via Video   I connected with patient on 03/30/20 at  3:40 PM EDT by a video enabled telemedicine application and verified that I am speaking with the correct person using two identifiers.  Location patient: Home Location provider: [employer], Office Persons participating in the virtual visit: Patient, Provider, CMA   I discussed the limitations of evaluation and management by telemedicine and the availability of in person appointments. The patient expressed understanding and agreed to proceed.  Subjective:   HPI:   49 year old female is present via video with concerns of pitting edema in her legs bilaterally x 1 week and worsening. She has a history of HTN and takes Lisinopril daily. She reports her blood pressure has been more elevated recently. Denies any increase in sodium. She denies any SOB or chest pain.  ROS:   See pertinent positives and negatives per HPI.  Patient Active Problem List   Diagnosis Date Noted  . COVID-19 07/09/2019  . Left hip pain 04/30/2019  . Otitis externa of left ear 04/30/2019  . Cholesteatoma of left ear 04/30/2019  . Dysfunction of left eustachian tube 04/30/2019  . Left ear pain 04/30/2019  . Sciatica 04/19/2019  . Class 3 severe obesity due to excess calories with serious comorbidity and body mass index (BMI) of 45.0 to 49.9 in adult ([place]) 04/19/2019  . Post-nasal drip 01/10/2019  . Allergic cough 01/10/2019  . Obstructive sleep apnea syndrome 12/11/2018  . Acute frontal sinusitis 12/10/2018  . Primary osteoarthritis of left hip 11/13/2018  . Body mass index 45.0-49.9, adult ([place]) 11/13/2018  . Morbid obesity ([place]) 11/13/2018  . Elevated cholesterol 11/12/2018  . Essential hypertension 11/12/2018  . Allergic rhinitis due to pollen 11/12/2018  . Reactive airway disease 11/12/2018  . Breast pain, right 07/17/2018  . Prediabetes 07/17/2018  . Refused influenza vaccine 07/17/2018  . Chronic nonintractable  headache 02/12/2018  . Dysuria 02/12/2018  . Localized edema 02/12/2018  . Hospital discharge follow-up 01/23/2018  . Bladder problem 01/05/2018  . Depression 01/05/2018  . Insomnia 01/05/2018  . Hepatitis C infection 12/01/2015  . RLS (restless legs syndrome) 10/23/2015  . Elevated liver enzymes 10/22/2015  . Recurrent major depression-severe ([place]) 04/17/2015  . GAD (generalized anxiety disorder) 04/15/2015  . Bipolar depression ([place]) 04/15/2015  . Severe recurrent major depression w/psychotic features, mood-congruent ([place]) 04/14/2015  . Anxiety disorder 04/14/2015  . Major depressive disorder, recurrent severe without psychotic features ([place]) 04/14/2015  . Suicidal ideations   . Chronic LBP 01/02/2015  . Adult hypothyroidism 06/25/2012  . Cobalamin deficiency 06/25/2012  . Fatigue 06/25/2012  . Chronic arthralgias of knees and hips 06/25/2012  . Leukocytosis 06/25/2012  . Vitamin D deficiency 06/25/2012    Social History   Tobacco Use  . Smoking status: Former Smoker    Packs/day: 1.00    Years: 10.00    Pack years: 10.00    Types: Cigarettes    Quit date: 02/09/2016    Years since quitting: 4.1  . Smokeless tobacco: Never Used  . Tobacco comment: use vapor  Substance Use Topics  . Alcohol use: No    Current Outpatient Medications:  .  ALPRAZolam (XANAX) 0.5 MG tablet, Take 0.5 mg by mouth 2 (two) times daily as needed., Disp: , Rfl:  .  ARIPiprazole (ABILIFY) 5 MG tablet, Take 1 tablet (5 mg total) by mouth daily., Disp: 30 tablet, Rfl: 2 .  atorvastatin (LIPITOR) 20 MG tablet, TAKE 1 TABLET(20 MG) BY MOUTH DAILY, Disp:  100 tablet, Rfl: 0 .  clomiPRAMINE (ANAFRANIL) 75 MG capsule, Take 75 mg by mouth at bedtime., Disp: , Rfl:  .  FINACEA 15 % FOAM, Apply 1 application topically 2 (two) times daily., Disp: , Rfl:  .  fluticasone (FLOVENT HFA) 44 MCG/ACT inhaler, Inhale 2 puffs into the lungs in the morning and at bedtime., Disp: 1 Inhaler, Rfl: 1 .  gabapentin  (NEURONTIN) 300 MG capsule, TAKE 1 CAPSULE(300 MG) BY MOUTH THREE TIMES DAILY AS NEEDED, Disp: 90 capsule, Rfl: 0 .  HYDROcodone-acetaminophen (NORCO) 10-325 MG tablet, Take 1 tablet by mouth 4 (four) times daily as needed., Disp: , Rfl:  .  lamoTRIgine (LAMICTAL) 150 MG tablet, Take 150 mg by mouth 2 (two) times daily., Disp: , Rfl:  .  levothyroxine (SYNTHROID) 75 MCG tablet, TAKE 1 TABLET(75 MCG) BY MOUTH DAILY, Disp: 90 tablet, Rfl: 0 .  lisinopril (ZESTRIL) 10 MG tablet, TAKE 1 TABLET(10 MG) BY MOUTH DAILY, Disp: 100 tablet, Rfl: 1 .  methylphenidate (RITALIN) 10 MG tablet, Take 1 tablet by mouth 2 (two) times daily., Disp: , Rfl:  .  nabumetone (RELAFEN) 750 MG tablet, Take 1 tablet (750 mg total) by mouth 2 (two) times daily as needed., Disp: 60 tablet, Rfl: 6 .  furosemide (LASIX) 20 MG tablet, Take 1 tablet (20 mg total) by mouth daily., Disp: 7 tablet, Rfl: 0  Allergies  Allergen Reactions  . Pregabalin Anaphylaxis  . Celebrex [Celecoxib] Nausea And Vomiting    Stomach pain  . Effexor [Venlafaxine Hcl] Other (See Comments)    Serotonin toxicity  . Mirapex [Pramipexole]     Mirapex- Worsening "body jerks"  . Pristiq [Desvenlafaxine] Other (See Comments)    Serotonin toxicity   . Prozac [Fluoxetine Hcl] Swelling  . Symbyax [Olanzapine-Fluoxetine Hcl] Swelling  . Wellbutrin [Bupropion Hcl] Swelling    Objective:   There were no vitals taken for this visit.  Patient is well-developed, well-nourished in no acute distress.  Resting comfortably at home.  Head is normocephalic, atraumatic.  No labored breathing.  Speech is clear and coherent with logical content.  Patient is alert and oriented at baseline.    Assessment and Plan:       Cherise was seen today for acute visit.  Diagnoses and all orders for this visit:  Peripheral edema -     CMP; Future -     POC Urinalysis Dipstick; Future  Essential hypertension -     CMP; Future -     POC Urinalysis Dipstick;  Future  Other orders -     furosemide (LASIX) 20 MG tablet; Take 1 tablet (20 mg total) by mouth daily.   Will follow-up pending labs. We may need to change her Lisinopril to Lisinopril HCTZ. Will obtain labs first.  Kennyth Arnold, FNP 03/30/2020

## 2020-03-31 ENCOUNTER — Other Ambulatory Visit: Payer: Self-pay

## 2020-03-31 ENCOUNTER — Other Ambulatory Visit (INDEPENDENT_AMBULATORY_CARE_PROVIDER_SITE_OTHER): Payer: PPO

## 2020-03-31 ENCOUNTER — Other Ambulatory Visit: Payer: Self-pay | Admitting: Family

## 2020-03-31 DIAGNOSIS — R609 Edema, unspecified: Secondary | ICD-10-CM | POA: Diagnosis not present

## 2020-03-31 DIAGNOSIS — I10 Essential (primary) hypertension: Secondary | ICD-10-CM | POA: Diagnosis not present

## 2020-03-31 DIAGNOSIS — A499 Bacterial infection, unspecified: Secondary | ICD-10-CM

## 2020-03-31 LAB — COMPREHENSIVE METABOLIC PANEL
ALT: 27 U/L (ref 0–35)
AST: 15 U/L (ref 0–37)
Albumin: 3.7 g/dL (ref 3.5–5.2)
Alkaline Phosphatase: 87 U/L (ref 39–117)
BUN: 7 mg/dL (ref 6–23)
CO2: 29 mEq/L (ref 19–32)
Calcium: 9.1 mg/dL (ref 8.4–10.5)
Chloride: 100 mEq/L (ref 96–112)
Creatinine, Ser: 0.69 mg/dL (ref 0.40–1.20)
GFR: 91.08 mL/min (ref >60.00)
Glucose, Bld: 252 mg/dL — ABNORMAL HIGH (ref 70–99)
Potassium: 4 mEq/L (ref 3.5–5.1)
Sodium: 135 mEq/L (ref 135–145)
Total Bilirubin: 0.3 mg/dL (ref 0.2–1.2)
Total Protein: 6.2 g/dL (ref 6.0–8.3)

## 2020-03-31 LAB — POCT URINALYSIS DIPSTICK
Bilirubin, UA: NEGATIVE
Glucose, UA: NEGATIVE
Ketones, UA: NEGATIVE
Leukocytes, UA: NEGATIVE
Nitrite, UA: NEGATIVE
Protein, UA: NEGATIVE
Spec Grav, UA: 1.015 (ref 1.010–1.025)
Urobilinogen, UA: 0.2 E.U./dL
pH, UA: 6 (ref 5.0–8.0)

## 2020-03-31 MED ORDER — NITROFURANTOIN MONOHYD MACRO 100 MG PO CAPS: 100.0000 mg | ORAL_CAPSULE | Freq: Two times a day (BID) | ORAL | 0 refills | Status: DC

## 2020-04-01 ENCOUNTER — Telehealth: Payer: Self-pay | Admitting: Family Medicine

## 2020-04-01 DIAGNOSIS — R635 Abnormal weight gain: Secondary | ICD-10-CM | POA: Diagnosis not present

## 2020-04-01 DIAGNOSIS — R948 Abnormal results of function studies of other organs and systems: Secondary | ICD-10-CM | POA: Diagnosis not present

## 2020-04-01 NOTE — Telephone Encounter (Signed)
Patient called and stated that she was concerned about her blood sugar being high and when she had taken it today it was 252. Transferred caller to the nurse triage for further evaluation.

## 2020-04-01 NOTE — Telephone Encounter (Signed)
Patient is calling and wanted to speak to someone regarding her lab results, please advise. CB is [phone]

## 2020-04-02 ENCOUNTER — Ambulatory Visit (INDEPENDENT_AMBULATORY_CARE_PROVIDER_SITE_OTHER): Payer: PPO | Admitting: Nurse Practitioner

## 2020-04-02 ENCOUNTER — Encounter: Payer: Self-pay | Admitting: Nurse Practitioner

## 2020-04-02 ENCOUNTER — Ambulatory Visit: Payer: PPO | Admitting: Obstetrics

## 2020-04-02 VITALS — BP 118/64 | HR 115 | Temp 98.1°F | Ht 64.02 in | Wt 295.6 lb

## 2020-04-02 DIAGNOSIS — E039 Hypothyroidism, unspecified: Secondary | ICD-10-CM

## 2020-04-02 DIAGNOSIS — R609 Edema, unspecified: Secondary | ICD-10-CM | POA: Diagnosis not present

## 2020-04-02 DIAGNOSIS — E1165 Type 2 diabetes mellitus with hyperglycemia: Secondary | ICD-10-CM | POA: Insufficient documentation

## 2020-04-02 LAB — TSH: TSH: 3.56 u[IU]/mL (ref 0.35–4.50)

## 2020-04-02 LAB — GLUCOSE, POCT (MANUAL RESULT ENTRY): POC Glucose: 276 mg/dl — AB (ref 70–99)

## 2020-04-02 LAB — HEMOGLOBIN A1C: Hgb A1c MFr Bld: 7.6 % — ABNORMAL HIGH (ref 4.6–6.5)

## 2020-04-02 LAB — T4, FREE: Free T4: 0.92 ng/dL (ref 0.60–1.60)

## 2020-04-02 MED ORDER — METFORMIN HCL 500 MG PO TABS: 500.0000 mg | ORAL_TABLET | Freq: Two times a day (BID) | ORAL | 5 refills | Status: DC

## 2020-04-02 MED ORDER — BLOOD GLUCOSE MONITOR KIT: PACK | 0 refills | Status: DC

## 2020-04-02 MED ORDER — FUROSEMIDE 20 MG PO TABS: 40.0000 mg | ORAL_TABLET | Freq: Every day | ORAL | 0 refills | Status: DC

## 2020-04-02 NOTE — Assessment & Plan Note (Addendum)
New diagnosis, random glucose of 300 Reports polyphagia, polyuria and weight gain in last 1year. Previous BMP and CMP indicated hx if hyperglycemia, bu no previous HgbA1c. Current use of antipsychotic. Has FHx of DM-type 2  POCT glucose today-fasting 276 Check HgbA1c Start metformin Prescribed glucometer rx-glucose check daily Provided education about need for lifestyle change and possible side effects of metformin (adequate oral hydration with water, low carb/sugar diet). Has already established with nutritionist for weight loss F/up in 49month

## 2020-04-02 NOTE — Progress Notes (Signed)
Subjective:  Patient ID: [name], female    DOB: May 19, 1971  Age: 49 y.o. MRN: [id number]  CC: Follow-up (Pt c/o edema, High blood pressure, and elevated blood sugars)  HPI  Type 2 diabetes mellitus with hyperglycemia, without long-term current use of insulin (HCC) New diagnosis, random glucose of 300 Reports polyphagia, polyuria and weight gain in last 1year. Previous BMP and CMP indicated hx if hyperglycemia, bu no previous HgbA1c. Current use of antipsychotic. Has FHx of DM-type 2  POCT glucose today-fasting 276 Check HgbA1c Start metformin Prescribed glucometer rx-glucose check daily Provided education about need for lifestyle change and possible side effects of metformin (adequate oral hydration with water, low carb/sugar diet). Has already established with nutritionist for weight loss F/up in 11month Also reports persistent LE edema despite use of furosemide [id number]. No cough or SOB or PND or erythema or leg pain.  Reviewed past Medical, Social and Family history today.  Outpatient Medications Prior to Visit  Medication Sig Dispense Refill  . ALPRAZolam (XANAX) 0.5 MG tablet Take 0.5 mg by mouth 2 (two) times daily as needed.    . ARIPiprazole (ABILIFY) 5 MG tablet Take 1 tablet (5 mg total) by mouth daily. 30 tablet 2  . atorvastatin (LIPITOR) 20 MG tablet TAKE 1 TABLET(20 MG) BY MOUTH DAILY 100 tablet 0  . clomiPRAMINE (ANAFRANIL) 75 MG capsule Take 75 mg by mouth at bedtime.    . Marland KitchenINACEA 15 % FOAM Apply 1 application topically 2 (two) times daily.    . fluticasone (FLOVENT HFA) 44 MCG/ACT inhaler Inhale 2 puffs into the lungs in the morning and at bedtime. 1 Inhaler 1  . gabapentin (NEURONTIN) 300 MG capsule TAKE 1 CAPSULE(300 MG) BY MOUTH THREE TIMES DAILY AS NEEDED 90 capsule 0  . HYDROcodone-acetaminophen (NORCO) 10-325 MG tablet Take 1 tablet by mouth 4 (four) times daily as needed.    . lamoTRIgine (LAMICTAL) 150 MG tablet Take 150 mg by mouth 2 (two)  times daily.    . Marland Kitchenevothyroxine (SYNTHROID) 75 MCG tablet TAKE 1 TABLET(75 MCG) BY MOUTH DAILY 90 tablet 0  . lisinopril (ZESTRIL) 10 MG tablet TAKE 1 TABLET(10 MG) BY MOUTH DAILY 100 tablet 1  . methylphenidate (RITALIN) 10 MG tablet Take 1 tablet by mouth 2 (two) times daily.    . furosemide (LASIX) 20 MG tablet Take 1 tablet (20 mg total) by mouth daily. 7 tablet 0  . nabumetone (RELAFEN) 750 MG tablet Take 1 tablet (750 mg total) by mouth 2 (two) times daily as needed. (Patient not taking: Reported on 04/02/2020) 60 tablet 6  . nitrofurantoin, macrocrystal-monohydrate, (MACROBID) 100 MG capsule Take 1 capsule (100 mg total) by mouth 2 (two) times daily. (Patient not taking: Reported on 04/02/2020) 10 capsule 0   No facility-administered medications prior to visit.    ROS See HPI  Objective:  BP (!) 118/64 (BP Location: Left Arm, Patient Position: Sitting)   Pulse (!) 115   Temp 98.1 F (36.7 C)   Ht 5' 4.02" (1.626 m)   Wt (!) 295 lb 9.6 oz (134.1 kg)   SpO2 98%   BMI 50.71 kg/m   Physical Exam Constitutional:      Appearance: She is obese.  Cardiovascular:     Rate and Rhythm: Normal rate.     Pulses: Normal pulses.  Pulmonary:     Effort: Pulmonary effort is normal.  Musculoskeletal:     Right lower leg: Edema present.     Left lower  leg: Edema present.  Neurological:     Mental Status: She is alert and oriented to person, place, and time.    Assessment & Plan:  This visit occurred during the SARS-CoV-2 public health emergency.  Safety protocols were in place, including screening questions prior to the visit, additional usage of staff PPE, and extensive cleaning of exam room while observing appropriate contact time as indicated for disinfecting solutions.   [place] was seen today for follow-up.  Diagnoses and all orders for this visit:  Type 2 diabetes mellitus with hyperglycemia, without long-term current use of insulin (HCC) -     Cancel: Microalbumin / creatinine  urine ratio -     Hemoglobin A1c -     POCT Glucose (CBG) -     metFORMIN (GLUCOPHAGE) 500 MG tablet; Take 1 tablet (500 mg total) by mouth 2 (two) times daily with a meal. -     blood glucose meter kit and supplies KIT; Dispense based on patient and insurance preference. Check glucose before breakfast. ICD: E11.65 -     Microalbumin / creatinine urine ratio; Future  Adult hypothyroidism -     TSH -     T4, free  Peripheral edema -     furosemide (LASIX) 20 MG tablet; Take 2 tablets (40 mg total) by mouth daily for 3 days.   Problem List Items Addressed This Visit      Endocrine   Adult hypothyroidism   Relevant Orders   TSH   T4, free   Type 2 diabetes mellitus with hyperglycemia, without long-term current use of insulin ([place]) - Primary    New diagnosis, random glucose of 300 Reports polyphagia, polyuria and weight gain in last 1year. Previous BMP and CMP indicated hx if hyperglycemia, bu no previous HgbA1c. Current use of antipsychotic. Has FHx of DM-type 2  POCT glucose today-fasting 276 Check HgbA1c Start metformin Prescribed glucometer rx-glucose check daily Provided education about need for lifestyle change and possible side effects of metformin (adequate oral hydration with water, low carb/sugar diet). Has already established with nutritionist for weight loss F/up in 59month     Relevant Medications   metFORMIN (GLUCOPHAGE) 500 MG tablet   blood glucose meter kit and supplies KIT   Other Relevant Orders   Hemoglobin A1c   POCT Glucose (CBG) (Completed)   Microalbumin / creatinine urine ratio     Other   Peripheral edema   Relevant Medications   furosemide (LASIX) 20 MG tablet      Follow-up: Return in about 4 weeks (around 04/30/2020) for DM with Dr. KEthelene Hal  CWilfred Lacy NP

## 2020-04-02 NOTE — Patient Instructions (Signed)
Go to lab for blood draw. Start metformin F/up with Dr. Ethelene Hal in 41month. Start glucose check.  Diabetes Mellitus and Nutrition, Adult When you have diabetes (diabetes mellitus), it is very important to have healthy eating habits because your blood sugar (glucose) levels are greatly affected by what you eat and drink. Eating healthy foods in the appropriate amounts, at about the same times every day, can help you:  Control your blood glucose.  Lower your risk of heart disease.  Improve your blood pressure.  Reach or maintain a healthy weight. Every person with diabetes is different, and each person has different needs for a meal plan. Your health care provider may recommend that you work with a diet and nutrition specialist (dietitian) to make a meal plan that is best for you. Your meal plan may vary depending on factors such as:  The calories you need.  The medicines you take.  Your weight.  Your blood glucose, blood pressure, and cholesterol levels.  Your activity level.  Other health conditions you have, such as heart or kidney disease. How do carbohydrates affect me? Carbohydrates, also called carbs, affect your blood glucose level more than any other type of food. Eating carbs naturally raises the amount of glucose in your blood. Carb counting is a method for keeping track of how many carbs you eat. Counting carbs is important to keep your blood glucose at a healthy level, especially if you use insulin or take certain oral diabetes medicines. It is important to know how many carbs you can safely have in each meal. This is different for every person. Your dietitian can help you calculate how many carbs you should have at each meal and for each snack. Foods that contain carbs include:  Bread, cereal, rice, pasta, and crackers.  Potatoes and corn.  Peas, beans, and lentils.  Milk and yogurt.  Fruit and juice.  Desserts, such as cakes, cookies, ice cream, and candy. How  does alcohol affect me? Alcohol can cause a sudden decrease in blood glucose (hypoglycemia), especially if you use insulin or take certain oral diabetes medicines. Hypoglycemia can be a life-threatening condition. Symptoms of hypoglycemia (sleepiness, dizziness, and confusion) are similar to symptoms of having too much alcohol. If your health care provider says that alcohol is safe for you, follow these guidelines:  Limit alcohol intake to no more than 1 drink per day for nonpregnant women and 2 drinks per day for men. One drink equals 12 oz of beer, 5 oz of wine, or 1 oz of hard liquor.  Do not drink on an empty stomach.  Keep yourself hydrated with water, diet soda, or unsweetened iced tea.  Keep in mind that regular soda, juice, and other mixers may contain a lot of sugar and must be counted as carbs. What are tips for following this plan?  Reading food labels  Start by checking the serving size on the "Nutrition Facts" label of packaged foods and drinks. The amount of calories, carbs, fats, and other nutrients listed on the label is based on one serving of the item. Many items contain more than one serving per package.  Check the total grams (g) of carbs in one serving. You can calculate the number of servings of carbs in one serving by dividing the total carbs by 15. For example, if a food has 30 g of total carbs, it would be equal to 2 servings of carbs.  Check the number of grams (g) of saturated and trans fats in  one serving. Choose foods that have low or no amount of these fats.  Check the number of milligrams (mg) of salt (sodium) in one serving. Most people should limit total sodium intake to less than 2,300 mg per day.  Always check the nutrition information of foods labeled as "low-fat" or "nonfat". These foods may be higher in added sugar or refined carbs and should be avoided.  Talk to your dietitian to identify your daily goals for nutrients listed on the  label. Shopping  Avoid buying canned, premade, or processed foods. These foods tend to be high in fat, sodium, and added sugar.  Shop around the outside edge of the grocery store. This includes fresh fruits and vegetables, bulk grains, fresh meats, and fresh dairy. Cooking  Use low-heat cooking methods, such as baking, instead of high-heat cooking methods like deep frying.  Cook using healthy oils, such as olive, canola, or sunflower oil.  Avoid cooking with butter, cream, or high-fat meats. Meal planning  Eat meals and snacks regularly, preferably at the same times every day. Avoid going long periods of time without eating.  Eat foods high in fiber, such as fresh fruits, vegetables, beans, and whole grains. Talk to your dietitian about how many servings of carbs you can eat at each meal.  Eat 4-6 ounces (oz) of lean protein each day, such as lean meat, chicken, fish, eggs, or tofu. One oz of lean protein is equal to: ? 1 oz of meat, chicken, or fish. ? 1 egg. ?  cup of tofu.  Eat some foods each day that contain healthy fats, such as avocado, nuts, seeds, and fish. Lifestyle  Check your blood glucose regularly.  Exercise regularly as told by your health care provider. This may include: ? 150 minutes of moderate-intensity or vigorous-intensity exercise each week. This could be brisk walking, biking, or water aerobics. ? Stretching and doing strength exercises, such as yoga or weightlifting, at least 2 times a week.  Take medicines as told by your health care provider.  Do not use any products that contain nicotine or tobacco, such as cigarettes and e-cigarettes. If you need help quitting, ask your health care provider.  Work with a Social worker or diabetes educator to identify strategies to manage stress and any emotional and social challenges. Questions to ask a health care provider  Do I need to meet with a diabetes educator?  Do I need to meet with a dietitian?  What  number can I call if I have questions?  When are the best times to check my blood glucose? Where to find more information:  American Diabetes Association: diabetes.org  Academy of Nutrition and Dietetics: www.eatright.[employer] of Diabetes and Digestive and Kidney Diseases (NIH): [employer] Summary  A healthy meal plan will help you control your blood glucose and maintain a healthy lifestyle.  Working with a diet and nutrition specialist (dietitian) can help you make a meal plan that is best for you.  Keep in mind that carbohydrates (carbs) and alcohol have immediate effects on your blood glucose levels. It is important to count carbs and to use alcohol carefully. This information is not intended to replace advice given to you by your health care provider. Make sure you discuss any questions you have with your health care provider. Document Revised: 08/04/2017 Document Reviewed: 09/26/2016 Elsevier Patient Education  2020 [employer].

## 2020-04-03 ENCOUNTER — Telehealth: Payer: Self-pay | Admitting: Family Medicine

## 2020-04-03 NOTE — Telephone Encounter (Signed)
I spoke with the patient. I am unsure where that referral came from - need to check with our referral coordinator to see if she can see what has happened.

## 2020-04-03 NOTE — Telephone Encounter (Signed)
Patient called needing to know why she was referred to [facility] for hip pain. Patient said Dr Junius Roads does her injections. Patient said she need some clarification as to why she was referred to [facility]. The number to contact patient is [phone]

## 2020-04-03 NOTE — Telephone Encounter (Signed)
Notified patient of lab results.  Patient verbalized understanding.  

## 2020-04-06 ENCOUNTER — Telehealth: Payer: Self-pay | Admitting: Family Medicine

## 2020-04-06 NOTE — Telephone Encounter (Signed)
Would you mind checking on this for me? There is nothing in our referrals from Dr. Junius Roads, but there is a notation/order in the encounters that seems to come from elsewhere with Dr. Junius Roads' name as the referring doctor.

## 2020-04-06 NOTE — Telephone Encounter (Signed)
Returned patients call, no answer LMTCB 

## 2020-04-06 NOTE — Telephone Encounter (Signed)
Patient was recently diagnosed with diabetes and started Metformin. She is now having diarrhea and wants to know if she can take Imodium. Please give her a call back at [phone] and advise.  Thank you

## 2020-04-07 ENCOUNTER — Other Ambulatory Visit: Payer: Self-pay

## 2020-04-08 ENCOUNTER — Telehealth: Payer: Self-pay | Admitting: Orthopaedic Surgery

## 2020-04-08 ENCOUNTER — Encounter: Payer: Self-pay | Admitting: Family Medicine

## 2020-04-08 ENCOUNTER — Ambulatory Visit (INDEPENDENT_AMBULATORY_CARE_PROVIDER_SITE_OTHER): Payer: PPO | Admitting: Family Medicine

## 2020-04-08 VITALS — BP 112/68 | HR 92 | Temp 97.9°F | Ht 64.0 in | Wt 283.0 lb

## 2020-04-08 DIAGNOSIS — E782 Mixed hyperlipidemia: Secondary | ICD-10-CM | POA: Diagnosis not present

## 2020-04-08 DIAGNOSIS — F329 Major depressive disorder, single episode, unspecified: Secondary | ICD-10-CM | POA: Diagnosis not present

## 2020-04-08 DIAGNOSIS — E1165 Type 2 diabetes mellitus with hyperglycemia: Secondary | ICD-10-CM

## 2020-04-08 DIAGNOSIS — E7800 Pure hypercholesterolemia, unspecified: Secondary | ICD-10-CM

## 2020-04-08 DIAGNOSIS — E039 Hypothyroidism, unspecified: Secondary | ICD-10-CM

## 2020-04-08 DIAGNOSIS — I10 Essential (primary) hypertension: Secondary | ICD-10-CM

## 2020-04-08 DIAGNOSIS — G4733 Obstructive sleep apnea (adult) (pediatric): Secondary | ICD-10-CM | POA: Diagnosis not present

## 2020-04-08 DIAGNOSIS — Z6841 Body Mass Index (BMI) 40.0 and over, adult: Secondary | ICD-10-CM | POA: Diagnosis not present

## 2020-04-08 DIAGNOSIS — R635 Abnormal weight gain: Secondary | ICD-10-CM | POA: Diagnosis not present

## 2020-04-08 MED ORDER — ATORVASTATIN CALCIUM 20 MG PO TABS: ORAL_TABLET | ORAL | 0 refills | Status: DC

## 2020-04-08 MED ORDER — METFORMIN HCL 500 MG PO TABS: 500.0000 mg | ORAL_TABLET | Freq: Two times a day (BID) | ORAL | 5 refills | Status: DC

## 2020-04-08 MED ORDER — LISINOPRIL 10 MG PO TABS: ORAL_TABLET | ORAL | 1 refills | Status: DC

## 2020-04-08 MED ORDER — LEVOTHYROXINE SODIUM 75 MCG PO TABS: ORAL_TABLET | ORAL | 0 refills | Status: DC

## 2020-04-08 NOTE — Telephone Encounter (Signed)
40

## 2020-04-08 NOTE — Telephone Encounter (Signed)
Pt called stating she previously spoke withDr.Xu about surgery and he told her she had to get her BMI down to 42 and the pt was just calling to reconfirm this. Pt would like a CB   [phone]

## 2020-04-08 NOTE — Telephone Encounter (Signed)
Office visit today

## 2020-04-08 NOTE — Progress Notes (Signed)
Established Patient Office Visit  Subjective:  Patient ID: [name], female    DOB: 08/19/1971  Age: 49 y.o. MRN: [id number]  CC:  Chief Complaint  Patient presents with  . Follow-up    follow up on BP and diabetes.     HPI [name] presents for follow-up of her recently diagnosed diabetes.  She is started her Metformin and has been tolerating it well.  She does have some loose stools with it.  Sugars have decreased after taking it.  She has switched her obesity management to the [facility] program.  The use of medications with their program and those have helped her lose weight in the past.  She has not had diabetic teaching.  She understands that weight loss her blood pressure and diabetes management.  Past Medical History:  Diagnosis Date  . ADHD (attention deficit hyperactivity disorder)   . Anemia    with pregnancy  . Anxiety   . Arthritis   . Bipolar disorder ([place])   . Chronic back pain greater than 3 months duration   . Depression   . Dysuria    has to have self in and out cath   . Eroded bladder suspension mesh ([place])   . Hepatitis    C  . Hypothyroidism   . Leg pain, bilateral   . Obsessive-compulsive disorder   . Rosacea   . Sciatic nerve pain   . Social anxiety disorder   . Thyroid disease     Past Surgical History:  Procedure Laterality Date  . BACK SURGERY     total of 6 back surgeries  . bladder mesh  2009  . DILITATION & CURRETTAGE/HYSTROSCOPY WITH NOVASURE ABLATION N/A 08/19/2016   Procedure: DILATATION & CURETTAGE WITH NOVASURE ABLATION;  Surgeon: [name], MD;  Location: [facility];  Service: Gynecology;  Laterality: N/A;  . EUS N/A 09/09/2015   Procedure: UPPER ENDOSCOPIC ULTRASOUND (EUS) RADIAL;  Surgeon: [name], MD;  Location: [facility];  Service: Endoscopy;  Laterality: N/A;  . EYE SURGERY    . INCONTINENCE SURGERY    . SPINAL FUSION  2008   times two    Family History  Problem Relation Age of Onset  .  Schizophrenia Father   . Schizophrenia Cousin     Social History   Socioeconomic History  . Marital status: Married    Spouse name: Not on file  . Number of children: Not on file  . Years of education: Not on file  . Highest education level: Not on file  Occupational History  . Not on file  Tobacco Use  . Smoking status: Former Smoker    Packs/day: 1.00    Years: 10.00    Pack years: 10.00    Types: Cigarettes    Quit date: 02/09/2016    Years since quitting: 4.1  . Smokeless tobacco: Never Used  . Tobacco comment: use vapor  Vaping Use  . Vaping Use: Never used  Substance and Sexual Activity  . Alcohol use: No  . Drug use: No  . Sexual activity: Yes  Other Topics Concern  . Not on file  Social History Narrative  . Not on file   Social Determinants of Health   Financial Resource Strain:   . Difficulty of Paying Living Expenses:   Food Insecurity:   . Worried About Charity fundraiser in the Last Year:   . Arboriculturist in the Last Year:   Transportation Needs:   .  Lack of Transportation (Medical):   Marland Kitchen Lack of Transportation (Non-Medical):   Physical Activity:   . Days of Exercise per Week:   . Minutes of Exercise per Session:   Stress:   . Feeling of Stress :   Social Connections:   . Frequency of Communication with Friends and Family:   . Frequency of Social Gatherings with Friends and Family:   . Attends Religious Services:   . Active Member of Clubs or Organizations:   . Attends Archivist Meetings:   Marland Kitchen Marital Status:   Intimate Partner Violence:   . Fear of Current or Ex-Partner:   . Emotionally Abused:   Marland Kitchen Physically Abused:   . Sexually Abused:     Outpatient Medications Prior to Visit  Medication Sig Dispense Refill  . ALPRAZolam (XANAX) 0.5 MG tablet Take 0.5 mg by mouth 2 (two) times daily as needed.    . ARIPiprazole (ABILIFY) 5 MG tablet Take 1 tablet (5 mg total) by mouth daily. 30 tablet 2  . blood glucose meter kit and  supplies KIT Dispense based on patient and insurance preference. Check glucose before breakfast. ICD: E11.65 1 each 0  . clomiPRAMINE (ANAFRANIL) 75 MG capsule Take 75 mg by mouth at bedtime.    Marland Kitchen FINACEA 15 % FOAM Apply 1 application topically 2 (two) times daily.    . fluticasone (FLOVENT HFA) 44 MCG/ACT inhaler Inhale 2 puffs into the lungs in the morning and at bedtime. 1 Inhaler 1  . gabapentin (NEURONTIN) 300 MG capsule TAKE 1 CAPSULE(300 MG) BY MOUTH THREE TIMES DAILY AS NEEDED 90 capsule 0  . HYDROcodone-acetaminophen (NORCO) 10-325 MG tablet Take 1 tablet by mouth 4 (four) times daily as needed.    . lamoTRIgine (LAMICTAL) 150 MG tablet Take 150 mg by mouth 2 (two) times daily.    . methylphenidate (RITALIN) 10 MG tablet Take 1 tablet by mouth 2 (two) times daily.    Marland Kitchen atorvastatin (LIPITOR) 20 MG tablet TAKE 1 TABLET(20 MG) BY MOUTH DAILY 100 tablet 0  . levothyroxine (SYNTHROID) 75 MCG tablet TAKE 1 TABLET(75 MCG) BY MOUTH DAILY 90 tablet 0  . lisinopril (ZESTRIL) 10 MG tablet TAKE 1 TABLET(10 MG) BY MOUTH DAILY 100 tablet 1  . metFORMIN (GLUCOPHAGE) 500 MG tablet Take 1 tablet (500 mg total) by mouth 2 (two) times daily with a meal. 60 tablet 5  . furosemide (LASIX) 20 MG tablet Take 2 tablets (40 mg total) by mouth daily for 3 days. 6 tablet 0   No facility-administered medications prior to visit.    Allergies  Allergen Reactions  . Pregabalin Anaphylaxis  . Celebrex [Celecoxib] Nausea And Vomiting    Stomach pain  . Effexor [Venlafaxine Hcl] Other (See Comments)    Serotonin toxicity  . Mirapex [Pramipexole]     Mirapex- Worsening "body jerks"  . Pristiq [Desvenlafaxine] Other (See Comments)    Serotonin toxicity   . Prozac [Fluoxetine Hcl] Swelling  . Symbyax [Olanzapine-Fluoxetine Hcl] Swelling  . Wellbutrin [Bupropion Hcl] Swelling    ROS Review of Systems  Constitutional: Negative.   HENT: Negative.   Eyes: Negative for photophobia and visual disturbance.    Respiratory: Negative.   Cardiovascular: Negative.   Gastrointestinal: Negative.   Musculoskeletal: Positive for arthralgias. Negative for joint swelling.  Skin: Negative for pallor and rash.  Neurological: Negative for tremors and speech difficulty.      Objective:    Physical Exam Vitals and nursing note reviewed.  Constitutional:  Appearance: Normal appearance. She is obese.  HENT:     Head: Normocephalic and atraumatic.  Eyes:     General: No scleral icterus.       Right eye: No discharge.        Left eye: No discharge.     Extraocular Movements: Extraocular movements intact.     Conjunctiva/sclera: Conjunctivae normal.     Pupils: Pupils are equal, round, and reactive to light.  Cardiovascular:     Rate and Rhythm: Normal rate and regular rhythm.  Pulmonary:     Effort: Pulmonary effort is normal.     Breath sounds: Normal breath sounds.  Abdominal:     General: Bowel sounds are normal.  Musculoskeletal:     Cervical back: No rigidity or tenderness.     Right lower leg: No edema.     Left lower leg: No edema.  Lymphadenopathy:     Cervical: No cervical adenopathy.  Neurological:     Mental Status: She is alert and oriented to person, place, and time.  Psychiatric:        Mood and Affect: Mood normal.        Behavior: Behavior normal.     BP 112/68   Pulse 92   Temp 97.9 F (36.6 C) (Tympanic)   Ht 5' 4"  (1.626 m)   Wt 283 lb (128.4 kg)   SpO2 95%   BMI 48.58 kg/m  Wt Readings from Last 3 Encounters:  04/08/20 283 lb (128.4 kg)  04/02/20 (!) 295 lb 9.6 oz (134.1 kg)  01/24/20 294 lb 9.6 oz (133.6 kg)     Health Maintenance Due  Topic Date Due  . FOOT EXAM  Never done  . OPHTHALMOLOGY EXAM  Never done  . COVID-19 Vaccine (1) Never done  . HIV Screening  Never done  . PAP SMEAR-Modifier  07/15/2019  . INFLUENZA VACCINE  04/05/2020    There are no preventive care reminders to display for this patient.  Lab Results  Component Value Date    TSH 3.56 04/02/2020   Lab Results  Component Value Date   WBC 20.3 (H) 12/30/2019   HGB 11.8 (L) 12/30/2019   HCT 37.2 12/30/2019   MCV 85.3 12/30/2019   PLT 333 12/30/2019   Lab Results  Component Value Date   NA 135 03/31/2020   K 4.0 03/31/2020   CO2 29 03/31/2020   GLUCOSE 252 (H) 03/31/2020   BUN 7 03/31/2020   CREATININE 0.69 03/31/2020   BILITOT 0.3 03/31/2020   ALKPHOS 87 03/31/2020   AST 15 03/31/2020   ALT 27 03/31/2020   PROT 6.2 03/31/2020   ALBUMIN 3.7 03/31/2020   CALCIUM 9.1 03/31/2020   ANIONGAP 10 12/30/2019   GFR 91.08 03/31/2020   Lab Results  Component Value Date   CHOL 166 11/12/2018   Lab Results  Component Value Date   HDL 35.80 (L) 11/12/2018   Lab Results  Component Value Date   LDLCALC 174 (H) 04/15/2015   Lab Results  Component Value Date   TRIG 221.0 (H) 11/12/2018   Lab Results  Component Value Date   CHOLHDL 5 11/12/2018   Lab Results  Component Value Date   HGBA1C 7.6 (H) 04/02/2020      Assessment & Plan:   Problem List Items Addressed This Visit      Cardiovascular and Mediastinum   Essential hypertension   Relevant Medications   lisinopril (ZESTRIL) 10 MG tablet   atorvastatin (LIPITOR) 20 MG tablet  Endocrine   Adult hypothyroidism   Relevant Medications   levothyroxine (SYNTHROID) 75 MCG tablet   Type 2 diabetes mellitus with hyperglycemia, without long-term current use of insulin (HCC) - Primary   Relevant Medications   lisinopril (ZESTRIL) 10 MG tablet   metFORMIN (GLUCOPHAGE) 500 MG tablet   atorvastatin (LIPITOR) 20 MG tablet   Other Relevant Orders   Ambulatory referral to diabetic education     Other   Elevated cholesterol   Relevant Medications   lisinopril (ZESTRIL) 10 MG tablet   atorvastatin (LIPITOR) 20 MG tablet      Meds ordered this encounter  Medications  . levothyroxine (SYNTHROID) 75 MCG tablet    Sig: TAKE 1 TABLET(75 MCG) BY MOUTH DAILY    Dispense:  90 tablet     Refill:  0  . lisinopril (ZESTRIL) 10 MG tablet    Sig: TAKE 1 TABLET(10 MG) BY MOUTH DAILY    Dispense:  100 tablet    Refill:  1  . metFORMIN (GLUCOPHAGE) 500 MG tablet    Sig: Take 1 tablet (500 mg total) by mouth 2 (two) times daily with a meal.    Dispense:  60 tablet    Refill:  5  . atorvastatin (LIPITOR) 20 MG tablet    Sig: TAKE 1 TABLET(20 MG) BY MOUTH DAILY    Dispense:  100 tablet    Refill:  0    Follow-up: Return in about 3 months (around 07/09/2020).   Stressed the importance of weight loss for her and her current health issues.  Agrees to go for diabetic teaching.  Will work with [facility] obesity management.  May eventually be a candidate for bariatric surgery.  Encouraged her to continue with her weight loss efforts.  Continue current medications and follow-up in 3 months Libby Maw, MD

## 2020-04-09 NOTE — Telephone Encounter (Signed)
Spoke to patient she is aware

## 2020-04-13 ENCOUNTER — Ambulatory Visit: Payer: PPO | Admitting: Obstetrics

## 2020-04-13 DIAGNOSIS — G8929 Other chronic pain: Secondary | ICD-10-CM | POA: Diagnosis not present

## 2020-04-13 DIAGNOSIS — M25562 Pain in left knee: Secondary | ICD-10-CM | POA: Diagnosis not present

## 2020-04-13 DIAGNOSIS — M1612 Unilateral primary osteoarthritis, left hip: Secondary | ICD-10-CM | POA: Diagnosis not present

## 2020-04-15 ENCOUNTER — Other Ambulatory Visit: Payer: Self-pay

## 2020-04-15 ENCOUNTER — Other Ambulatory Visit (HOSPITAL_COMMUNITY)
Admission: RE | Admit: 2020-04-15 | Discharge: 2020-04-15 | Disposition: A | Discharge status: Home / Self Care | Payer: PPO | Source: Ambulatory Visit | Attending: Obstetrics | Admitting: Obstetrics

## 2020-04-15 ENCOUNTER — Ambulatory Visit (INDEPENDENT_AMBULATORY_CARE_PROVIDER_SITE_OTHER): Payer: PPO | Admitting: Obstetrics

## 2020-04-15 ENCOUNTER — Encounter: Payer: Self-pay | Admitting: Obstetrics

## 2020-04-15 VITALS — BP 113/76 | HR 87 | Ht 64.0 in | Wt 278.0 lb

## 2020-04-15 DIAGNOSIS — N898 Other specified noninflammatory disorders of vagina: Secondary | ICD-10-CM

## 2020-04-15 DIAGNOSIS — Z01419 Encounter for gynecological examination (general) (routine) without abnormal findings: Secondary | ICD-10-CM

## 2020-04-15 DIAGNOSIS — Z6841 Body Mass Index (BMI) 40.0 and over, adult: Secondary | ICD-10-CM

## 2020-04-15 NOTE — Progress Notes (Signed)
Pt presents for annual, pap, and all STD testing.  Normal mammogram 01/28/20

## 2020-04-15 NOTE — Progress Notes (Signed)
Subjective:        [name] is a 49 y.o. female here for a routine exam.  Current complaints: None.    Personal health questionnaire:  Is patient Ashkenazi Jewish, have a family history of breast and/or ovarian cancer: no Is there a family history of uterine cancer diagnosed at age < 78, gastrointestinal cancer, urinary tract cancer, family member who is a Field seismologist syndrome-associated carrier: no Is the patient overweight and hypertensive, family history of diabetes, personal history of gestational diabetes, preeclampsia or PCOS: no Is patient over 42, have PCOS,  family history of premature CHD under age 71, diabetes, smoke, have hypertension or peripheral artery disease:  no At any time, has a partner hit, kicked or otherwise hurt or frightened you?: no Over the past 2 weeks, have you felt down, depressed or hopeless?: no Over the past 2 weeks, have you felt little interest or pleasure in doing things?:no   Gynecologic History Patient's last menstrual period was 04/01/2020. Contraception: tubal ligation Last Pap: 2018. Results were: normal Last mammogram: 01-2020. Results were: normal  Obstetric History OB History  Gravida Para Term Preterm AB Living  3       1 2   SAB TAB Ectopic Multiple Live Births    1     2    # Outcome Date GA Lbr Len/2nd Weight Sex Delivery Anes PTL Lv  3 Gravida           2 Gravida           1 TAB             Past Medical History:  Diagnosis Date  . ADHD (attention deficit hyperactivity disorder)   . Anemia    with pregnancy  . Anxiety   . Arthritis   . Bipolar disorder ([place])   . Chronic back pain greater than 3 months duration   . Depression   . Dysuria    has to have self in and out cath   . Eroded bladder suspension mesh ([place])   . Hepatitis    C  . Hypothyroidism   . Leg pain, bilateral   . Obsessive-compulsive disorder   . Rosacea   . Sciatic nerve pain   . Social anxiety disorder   . Thyroid disease     Past Surgical  History:  Procedure Laterality Date  . BACK SURGERY     total of 6 back surgeries  . bladder mesh  2009  . DILITATION & CURRETTAGE/HYSTROSCOPY WITH NOVASURE ABLATION N/A 08/19/2016   Procedure: DILATATION & CURETTAGE WITH NOVASURE ABLATION;  Surgeon: [name], MD;  Location: [facility];  Service: Gynecology;  Laterality: N/A;  . EUS N/A 09/09/2015   Procedure: UPPER ENDOSCOPIC ULTRASOUND (EUS) RADIAL;  Surgeon: [name], MD;  Location: [facility];  Service: Endoscopy;  Laterality: N/A;  . EYE SURGERY    . INCONTINENCE SURGERY    . SPINAL FUSION  2008   times two  . TUBAL LIGATION  2006     Current Outpatient Medications:  .  ALPRAZolam (XANAX) 0.5 MG tablet, Take 0.5 mg by mouth 2 (two) times daily as needed., Disp: , Rfl:  .  ARIPiprazole (ABILIFY) 5 MG tablet, Take 1 tablet (5 mg total) by mouth daily., Disp: 30 tablet, Rfl: 2 .  atorvastatin (LIPITOR) 20 MG tablet, TAKE 1 TABLET(20 MG) BY MOUTH DAILY, Disp: 100 tablet, Rfl: 0 .  clomiPRAMINE (ANAFRANIL) 75 MG capsule, Take 75 mg by mouth at bedtime., Disp: ,  Rfl:  .  gabapentin (NEURONTIN) 300 MG capsule, TAKE 1 CAPSULE(300 MG) BY MOUTH THREE TIMES DAILY AS NEEDED, Disp: 90 capsule, Rfl: 0 .  HYDROcodone-acetaminophen (NORCO) 10-325 MG tablet, Take 1 tablet by mouth 4 (four) times daily as needed., Disp: , Rfl:  .  lamoTRIgine (LAMICTAL) 150 MG tablet, Take 150 mg by mouth 2 (two) times daily., Disp: , Rfl:  .  levothyroxine (SYNTHROID) 75 MCG tablet, TAKE 1 TABLET(75 MCG) BY MOUTH DAILY, Disp: 90 tablet, Rfl: 0 .  lisinopril (ZESTRIL) 10 MG tablet, TAKE 1 TABLET(10 MG) BY MOUTH DAILY, Disp: 100 tablet, Rfl: 1 .  metFORMIN (GLUCOPHAGE) 500 MG tablet, Take 1 tablet (500 mg total) by mouth 2 (two) times daily with a meal., Disp: 60 tablet, Rfl: 5 .  methylphenidate (RITALIN) 10 MG tablet, Take 1 tablet by mouth 2 (two) times daily., Disp: , Rfl:  .  blood glucose meter kit and supplies KIT, Dispense based on patient and insurance  preference. Check glucose before breakfast. ICD: E11.65 (Patient not taking: Reported on 04/15/2020), Disp: 1 each, Rfl: 0 .  FINACEA 15 % FOAM, Apply 1 application topically 2 (two) times daily. (Patient not taking: Reported on 04/15/2020), Disp: , Rfl:  .  fluticasone (FLOVENT HFA) 44 MCG/ACT inhaler, Inhale 2 puffs into the lungs in the morning and at bedtime. (Patient not taking: Reported on 04/15/2020), Disp: 1 Inhaler, Rfl: 1 Allergies  Allergen Reactions  . Pregabalin Anaphylaxis  . Celebrex [Celecoxib] Nausea And Vomiting    Stomach pain  . Effexor [Venlafaxine Hcl] Other (See Comments)    Serotonin toxicity  . Mirapex [Pramipexole]     Mirapex- Worsening "body jerks"  . Pristiq [Desvenlafaxine] Other (See Comments)    Serotonin toxicity   . Prozac [Fluoxetine Hcl] Swelling  . Symbyax [Olanzapine-Fluoxetine Hcl] Swelling  . Wellbutrin [Bupropion Hcl] Swelling    Social History   Tobacco Use  . Smoking status: Light Tobacco Smoker    Packs/day: 1.00    Years: 10.00    Pack years: 10.00    Types: Cigarettes    Last attempt to quit: 02/09/2016    Years since quitting: 4.1  . Smokeless tobacco: Never Used  . Tobacco comment: use vapor  Substance Use Topics  . Alcohol use: No    Family History  Problem Relation Age of Onset  . Schizophrenia Father   . Schizophrenia Cousin   . Hyperthyroidism Mother   . Hypothyroidism Maternal Grandmother       Review of Systems  Constitutional: negative for fatigue and weight loss Respiratory: negative for cough and wheezing Cardiovascular: negative for chest pain, fatigue and palpitations Gastrointestinal: negative for abdominal pain and change in bowel habits Musculoskeletal:negative for myalgias Neurological: negative for gait problems and tremors Behavioral/Psych: negative for abusive relationship, depression Endocrine: negative for temperature intolerance    Genitourinary:negative for abnormal menstrual periods, genital lesions,  hot flashes, sexual problems and vaginal discharge Integument/breast: negative for breast lump, breast tenderness, nipple discharge and skin lesion(s)    Objective:       BP 113/76   Pulse 87   Ht 5' 4"  (1.626 m)   Wt 278 lb (126.1 kg)   LMP 04/01/2020   BMI 47.72 kg/m  General:   alert and no distress  Skin:   no rash or abnormalities  Lungs:   clear to auscultation bilaterally  Heart:   regular rate and rhythm, S1, S2 normal, no murmur, click, rub or gallop  Breasts:   normal without suspicious  masses, skin or nipple changes or axillary nodes  Abdomen:  normal findings: no organomegaly, soft, non-tender and no hernia  Pelvis:  External genitalia: normal general appearance Urinary system: urethral meatus normal and bladder without fullness, nontender Vaginal: normal without tenderness, induration or masses Cervix: normal appearance Adnexa: normal bimanual exam Uterus: anteverted and non-tender, normal size   Lab Review Urine pregnancy test Labs reviewed yes Radiologic studies reviewed yes  50% of 20 min visit spent on counseling and coordination of care.   Assessment:     1. Encounter for routine gynecological examination with Papanicolaou smear of cervix Rx: - Cytology - PAP  2. Vaginal discharge Rx: - Cervicovaginal ancillary only( [place])  3. Class 3 severe obesity due to excess calories without serious comorbidity with body mass index (BMI) of 45.0 to 49.9 in adult [facility]) - program od caloric reduction, exercise and behavioral modification recommended    Plan:    Education reviewed: calcium supplements, depression evaluation, low fat, low cholesterol diet, safe sex/STD prevention, self breast exams, smoking cessation and weight bearing exercise. Follow up in: 1 year.    Shelly Bombard, MD 04/15/2020 11:27 AM

## 2020-04-16 ENCOUNTER — Telehealth: Payer: Self-pay

## 2020-04-16 ENCOUNTER — Other Ambulatory Visit: Payer: Self-pay | Admitting: Family Medicine

## 2020-04-16 ENCOUNTER — Other Ambulatory Visit: Payer: Self-pay | Admitting: Obstetrics

## 2020-04-16 DIAGNOSIS — M5430 Sciatica, unspecified side: Secondary | ICD-10-CM

## 2020-04-16 DIAGNOSIS — N760 Acute vaginitis: Secondary | ICD-10-CM

## 2020-04-16 DIAGNOSIS — B9689 Other specified bacterial agents as the cause of diseases classified elsewhere: Secondary | ICD-10-CM

## 2020-04-16 LAB — CERVICOVAGINAL ANCILLARY ONLY
Bacterial Vaginitis (gardnerella): POSITIVE — AB
Candida Glabrata: NEGATIVE
Candida Vaginitis: NEGATIVE
Chlamydia: NEGATIVE
Comment: NEGATIVE
Comment: NEGATIVE
Comment: NEGATIVE
Comment: NEGATIVE
Comment: NEGATIVE
Comment: NORMAL
Neisseria Gonorrhea: NEGATIVE
Trichomonas: NEGATIVE

## 2020-04-16 MED ORDER — METRONIDAZOLE 500 MG PO TABS: 500.0000 mg | ORAL_TABLET | Freq: Two times a day (BID) | ORAL | 2 refills | Status: DC

## 2020-04-16 NOTE — Telephone Encounter (Signed)
Advised of results and rx 

## 2020-04-17 DIAGNOSIS — Z6841 Body Mass Index (BMI) 40.0 and over, adult: Secondary | ICD-10-CM | POA: Diagnosis not present

## 2020-04-17 LAB — CYTOLOGY - PAP
Comment: NEGATIVE
Diagnosis: NEGATIVE
High risk HPV: NEGATIVE

## 2020-04-18 ENCOUNTER — Other Ambulatory Visit: Payer: Self-pay | Admitting: Family Medicine

## 2020-04-18 DIAGNOSIS — E039 Hypothyroidism, unspecified: Secondary | ICD-10-CM

## 2020-04-21 ENCOUNTER — Telehealth: Payer: Self-pay | Admitting: Family Medicine

## 2020-04-21 DIAGNOSIS — Z79899 Other long term (current) drug therapy: Secondary | ICD-10-CM | POA: Diagnosis not present

## 2020-04-21 DIAGNOSIS — I10 Essential (primary) hypertension: Secondary | ICD-10-CM | POA: Diagnosis not present

## 2020-04-21 DIAGNOSIS — M25552 Pain in left hip: Secondary | ICD-10-CM | POA: Diagnosis not present

## 2020-04-21 DIAGNOSIS — E119 Type 2 diabetes mellitus without complications: Secondary | ICD-10-CM | POA: Diagnosis not present

## 2020-04-21 NOTE — Telephone Encounter (Signed)
Patient called after hours service 04/20/20 10:29 pm: Stated she can't remember if she took her metformin, wonders if she should take it or skip it. Checked her blood surgar a couple hours ago and it was 114. Triage nurse advised patient that if her blood sugar levels have been within normal range recently and you're following a healthy diet and lifestyle, then missing one dose isn't likely a problem - per [email or web address] Patient stated understanding.

## 2020-04-22 DIAGNOSIS — G8929 Other chronic pain: Secondary | ICD-10-CM | POA: Diagnosis not present

## 2020-04-22 DIAGNOSIS — M1712 Unilateral primary osteoarthritis, left knee: Secondary | ICD-10-CM | POA: Diagnosis not present

## 2020-04-22 DIAGNOSIS — M25562 Pain in left knee: Secondary | ICD-10-CM | POA: Diagnosis not present

## 2020-04-22 DIAGNOSIS — M1612 Unilateral primary osteoarthritis, left hip: Secondary | ICD-10-CM | POA: Diagnosis not present

## 2020-04-22 DIAGNOSIS — M778 Other enthesopathies, not elsewhere classified: Secondary | ICD-10-CM | POA: Diagnosis not present

## 2020-04-30 ENCOUNTER — Other Ambulatory Visit: Payer: Self-pay

## 2020-04-30 ENCOUNTER — Ambulatory Visit: Payer: PPO | Admitting: Family Medicine

## 2020-05-01 ENCOUNTER — Ambulatory Visit (INDEPENDENT_AMBULATORY_CARE_PROVIDER_SITE_OTHER): Payer: PPO | Admitting: Family Medicine

## 2020-05-01 ENCOUNTER — Encounter: Payer: Self-pay | Admitting: Family Medicine

## 2020-05-01 VITALS — BP 108/66 | HR 87 | Temp 97.9°F | Ht 64.0 in | Wt 273.8 lb

## 2020-05-01 DIAGNOSIS — E1165 Type 2 diabetes mellitus with hyperglycemia: Secondary | ICD-10-CM | POA: Diagnosis not present

## 2020-05-01 DIAGNOSIS — Z6841 Body Mass Index (BMI) 40.0 and over, adult: Secondary | ICD-10-CM | POA: Diagnosis not present

## 2020-05-01 NOTE — Progress Notes (Signed)
Established Patient Office Visit  Subjective:  Patient ID: [name], female    DOB: 10/26/1970  Age: 49 y.o. MRN: [id number]  CC:  Chief Complaint  Patient presents with  . Follow-up    follow up on DM, patient concerned due to sugar levels going up and down    HPI [name] presents for interim diabetes follow-up.  Has been concerned about her blood sugar variation.  Brings in multiple fasting sugars that are mostly in the less than 140 range.  She has discontinued all sweetsy drinks and been able to lose some weight.  She has never had an eye check.  She has been unable to connect with diabetic teaching.  She is tolerating her medicines well. Past Medical History:  Diagnosis Date  . ADHD (attention deficit hyperactivity disorder)   . Anemia    with pregnancy  . Anxiety   . Arthritis   . Bipolar disorder ([place])   . Chronic back pain greater than 3 months duration   . Depression   . Dysuria    has to have self in and out cath   . Eroded bladder suspension mesh ([place])   . Hepatitis    C  . Hypothyroidism   . Leg pain, bilateral   . Obsessive-compulsive disorder   . Rosacea   . Sciatic nerve pain   . Social anxiety disorder   . Thyroid disease     Past Surgical History:  Procedure Laterality Date  . BACK SURGERY     total of 6 back surgeries  . bladder mesh  2009  . DILITATION & CURRETTAGE/HYSTROSCOPY WITH NOVASURE ABLATION N/A 08/19/2016   Procedure: DILATATION & CURETTAGE WITH NOVASURE ABLATION;  Surgeon: [name], MD;  Location: [facility];  Service: Gynecology;  Laterality: N/A;  . EUS N/A 09/09/2015   Procedure: UPPER ENDOSCOPIC ULTRASOUND (EUS) RADIAL;  Surgeon: [name], MD;  Location: [facility];  Service: Endoscopy;  Laterality: N/A;  . EYE SURGERY    . INCONTINENCE SURGERY    . SPINAL FUSION  2008   times two  . TUBAL LIGATION  2006    Family History  Problem Relation Age of Onset  . Schizophrenia Father   . Schizophrenia Cousin   .  Hyperthyroidism Mother   . Hypothyroidism Maternal Grandmother     Social History   Socioeconomic History  . Marital status: Married    Spouse name: Not on file  . Number of children: Not on file  . Years of education: Not on file  . Highest education level: Not on file  Occupational History  . Not on file  Tobacco Use  . Smoking status: Light Tobacco Smoker    Packs/day: 1.00    Years: 10.00    Pack years: 10.00    Types: Cigarettes    Last attempt to quit: 02/09/2016    Years since quitting: 4.2  . Smokeless tobacco: Never Used  . Tobacco comment: use vapor  Vaping Use  . Vaping Use: Never used  Substance and Sexual Activity  . Alcohol use: No  . Drug use: No  . Sexual activity: Yes  Other Topics Concern  . Not on file  Social History Narrative  . Not on file   Social Determinants of Health   Financial Resource Strain:   . Difficulty of Paying Living Expenses: Not on file  Food Insecurity:   . Worried About Charity fundraiser in the Last Year: Not on file  .  Ran Out of Food in the Last Year: Not on file  Transportation Needs:   . Lack of Transportation (Medical): Not on file  . Lack of Transportation (Non-Medical): Not on file  Physical Activity:   . Days of Exercise per Week: Not on file  . Minutes of Exercise per Session: Not on file  Stress:   . Feeling of Stress : Not on file  Social Connections:   . Frequency of Communication with Friends and Family: Not on file  . Frequency of Social Gatherings with Friends and Family: Not on file  . Attends Religious Services: Not on file  . Active Member of Clubs or Organizations: Not on file  . Attends Archivist Meetings: Not on file  . Marital Status: Not on file  Intimate Partner Violence:   . Fear of Current or Ex-Partner: Not on file  . Emotionally Abused: Not on file  . Physically Abused: Not on file  . Sexually Abused: Not on file    Outpatient Medications Prior to Visit  Medication Sig  Dispense Refill  . ALPRAZolam (XANAX) 0.5 MG tablet Take 0.5 mg by mouth 2 (two) times daily as needed.    . ARIPiprazole (ABILIFY) 5 MG tablet Take 1 tablet (5 mg total) by mouth daily. 30 tablet 2  . atorvastatin (LIPITOR) 20 MG tablet TAKE 1 TABLET(20 MG) BY MOUTH DAILY 100 tablet 0  . blood glucose meter kit and supplies KIT Dispense based on patient and insurance preference. Check glucose before breakfast. ICD: E11.65 1 each 0  . clomiPRAMINE (ANAFRANIL) 75 MG capsule Take 75 mg by mouth at bedtime.    Marland Kitchen FINACEA 15 % FOAM Apply 1 application topically 2 (two) times daily.     Marland Kitchen gabapentin (NEURONTIN) 300 MG capsule TAKE 1 CAPSULE(300 MG) BY MOUTH THREE TIMES DAILY AS NEEDED 90 capsule 0  . lamoTRIgine (LAMICTAL) 150 MG tablet Take 150 mg by mouth 2 (two) times daily.    Marland Kitchen levothyroxine (SYNTHROID) 75 MCG tablet TAKE 1 TABLET(75 MCG) BY MOUTH DAILY 90 tablet 0  . lisinopril (ZESTRIL) 10 MG tablet TAKE 1 TABLET(10 MG) BY MOUTH DAILY 100 tablet 1  . metFORMIN (GLUCOPHAGE) 500 MG tablet Take 1 tablet (500 mg total) by mouth 2 (two) times daily with a meal. 60 tablet 5  . methylphenidate (RITALIN) 10 MG tablet Take 1 tablet by mouth 2 (two) times daily.    . metroNIDAZOLE (FLAGYL) 500 MG tablet Take 1 tablet (500 mg total) by mouth 2 (two) times daily. 14 tablet 2  . oxyCODONE-acetaminophen (PERCOCET) 7.5-325 MG tablet Take 1 tablet by mouth every 6 (six) hours as needed.    Marland Kitchen HYDROcodone-acetaminophen (NORCO) 10-325 MG tablet Take 1 tablet by mouth 4 (four) times daily as needed. (Patient not taking: Reported on 05/01/2020)    . fluticasone (FLOVENT HFA) 44 MCG/ACT inhaler Inhale 2 puffs into the lungs in the morning and at bedtime. (Patient not taking: Reported on 04/15/2020) 1 Inhaler 1   No facility-administered medications prior to visit.    Allergies  Allergen Reactions  . Pregabalin Anaphylaxis  . Celebrex [Celecoxib] Nausea And Vomiting    Stomach pain  . Effexor [Venlafaxine Hcl]  Other (See Comments)    Serotonin toxicity  . Mirapex [Pramipexole]     Mirapex- Worsening "body jerks"  . Pristiq [Desvenlafaxine] Other (See Comments)    Serotonin toxicity   . Prozac [Fluoxetine Hcl] Swelling  . Symbyax [Olanzapine-Fluoxetine Hcl] Swelling  . Wellbutrin [Bupropion Hcl] Swelling  ROS Review of Systems  Constitutional: Negative.   HENT: Negative.   Respiratory: Negative.   Cardiovascular: Negative.   Gastrointestinal: Negative.   Endocrine: Negative for polyphagia and polyuria.      Objective:    Physical Exam Vitals and nursing note reviewed.  Constitutional:      General: She is not in acute distress.    Appearance: Normal appearance. She is obese. She is not ill-appearing, toxic-appearing or diaphoretic.  HENT:     Head: Normocephalic and atraumatic.     Right Ear: External ear normal.     Left Ear: External ear normal.  Eyes:     General: No scleral icterus.       Right eye: No discharge.        Left eye: No discharge.     Extraocular Movements: Extraocular movements intact.     Conjunctiva/sclera: Conjunctivae normal.  Cardiovascular:     Rate and Rhythm: Normal rate and regular rhythm.     Pulses:          Dorsalis pedis pulses are 2+ on the right side and 2+ on the left side.       Posterior tibial pulses are 1+ on the right side and 1+ on the left side.  Pulmonary:     Effort: Pulmonary effort is normal.     Breath sounds: Normal breath sounds.  Neurological:     Mental Status: She is alert.    Diabetic Foot Exam - Simple   Simple Foot Form Visual Inspection No deformities, no ulcerations, no other skin breakdown bilaterally: Yes See comments: Yes Sensation Testing Intact to touch and monofilament testing bilaterally: Yes Pulse Check Posterior Tibialis and Dorsalis pulse intact bilaterally: Yes Comments Feet are cavus bilaterally.     BP 108/66   Pulse 87   Temp 97.9 F (36.6 C) (Tympanic)   Ht 5' 4"  (1.626 m)   Wt 273  lb 12.8 oz (124.2 kg)   LMP 04/01/2020   SpO2 96%   BMI 47.00 kg/m  Wt Readings from Last 3 Encounters:  05/01/20 273 lb 12.8 oz (124.2 kg)  04/15/20 278 lb (126.1 kg)  04/08/20 283 lb (128.4 kg)     Health Maintenance Due  Topic Date Due  . FOOT EXAM  Never done  . OPHTHALMOLOGY EXAM  Never done  . HIV Screening  Never done  . INFLUENZA VACCINE  04/05/2020    There are no preventive care reminders to display for this patient.  Lab Results  Component Value Date   TSH 3.56 04/02/2020   Lab Results  Component Value Date   WBC 20.3 (H) 12/30/2019   HGB 11.8 (L) 12/30/2019   HCT 37.2 12/30/2019   MCV 85.3 12/30/2019   PLT 333 12/30/2019   Lab Results  Component Value Date   NA 135 03/31/2020   K 4.0 03/31/2020   CO2 29 03/31/2020   GLUCOSE 252 (H) 03/31/2020   BUN 7 03/31/2020   CREATININE 0.69 03/31/2020   BILITOT 0.3 03/31/2020   ALKPHOS 87 03/31/2020   AST 15 03/31/2020   ALT 27 03/31/2020   PROT 6.2 03/31/2020   ALBUMIN 3.7 03/31/2020   CALCIUM 9.1 03/31/2020   ANIONGAP 10 12/30/2019   GFR 91.08 03/31/2020   Lab Results  Component Value Date   CHOL 166 11/12/2018   Lab Results  Component Value Date   HDL 35.80 (L) 11/12/2018   Lab Results  Component Value Date   LDLCALC 174 (H) 04/15/2015  Lab Results  Component Value Date   TRIG 221.0 (H) 11/12/2018   Lab Results  Component Value Date   CHOLHDL 5 11/12/2018   Lab Results  Component Value Date   HGBA1C 7.6 (H) 04/02/2020      Assessment & Plan:   Problem List Items Addressed This Visit      Endocrine   Type 2 diabetes mellitus with hyperglycemia, without long-term current use of insulin ([place]) - Primary   Relevant Orders   Ambulatory referral to diabetic education   Ambulatory referral to Ophthalmology      No orders of the defined types were placed in this encounter.   Follow-up: Return check glucose fasting and 2 hours after a meal., for return in November as previously  planned. .   We referred her for diabetic teaching because I think it could be helpful.  Suggested that she try doing it with zoom if unable to go in person. Libby Maw, MD

## 2020-05-04 DIAGNOSIS — E119 Type 2 diabetes mellitus without complications: Secondary | ICD-10-CM | POA: Diagnosis not present

## 2020-05-04 DIAGNOSIS — Z6841 Body Mass Index (BMI) 40.0 and over, adult: Secondary | ICD-10-CM | POA: Diagnosis not present

## 2020-05-05 ENCOUNTER — Encounter: Payer: Self-pay | Admitting: Orthopaedic Surgery

## 2020-05-05 ENCOUNTER — Ambulatory Visit (INDEPENDENT_AMBULATORY_CARE_PROVIDER_SITE_OTHER): Payer: PPO

## 2020-05-05 ENCOUNTER — Ambulatory Visit (INDEPENDENT_AMBULATORY_CARE_PROVIDER_SITE_OTHER): Payer: PPO | Admitting: Orthopaedic Surgery

## 2020-05-05 VITALS — Ht 64.0 in | Wt 276.2 lb

## 2020-05-05 DIAGNOSIS — G8929 Other chronic pain: Secondary | ICD-10-CM | POA: Diagnosis not present

## 2020-05-05 DIAGNOSIS — M25562 Pain in left knee: Secondary | ICD-10-CM

## 2020-05-05 DIAGNOSIS — M1612 Unilateral primary osteoarthritis, left hip: Secondary | ICD-10-CM

## 2020-05-05 MED ORDER — METHYLPREDNISOLONE ACETATE 40 MG/ML IJ SUSP
40.0000 mg | INTRAMUSCULAR | Status: AC | PRN
  Administered 2020-05-05: 40 mg via INTRA_ARTICULAR

## 2020-05-05 MED ORDER — BUPIVACAINE HCL 0.25 % IJ SOLN
2.0000 mL | INTRAMUSCULAR | Status: AC | PRN
  Administered 2020-05-05: 2 mL via INTRA_ARTICULAR

## 2020-05-05 MED ORDER — LIDOCAINE HCL 1 % IJ SOLN
2.0000 mL | INTRAMUSCULAR | Status: AC | PRN
  Administered 2020-05-05: 2 mL

## 2020-05-05 NOTE — Progress Notes (Signed)
Office Visit Note   Patient: [name]           Date of Birth: 07-29-1971           MRN: [id number] Visit Date: 05/05/2020              Requested by: Libby Maw, MD [address] [place],  [place] [id number] PCP: Libby Maw, MD   Assessment & Plan: Visit Diagnoses:  1. Chronic pain of left knee   2. Unilateral primary osteoarthritis, left hip     Plan: Impression is left knee pain possibly referred from her hip.  We will go ahead and proceed with a diagnostic and possibly therapeutic cortisone injection to the left knee today.  She will follow up with [place] as needed.  Follow-Up Instructions: Return if symptoms worsen or fail to improve.   Orders:  Orders Placed This Encounter  Procedures  . Large Joint Inj  . XR KNEE 3 VIEW LEFT   No orders of the defined types were placed in this encounter.     Procedures: Large Joint Inj: L knee on 05/05/2020 3:40 PM Indications: pain Details: 22 G needle, anterolateral approach Medications: 2 mL lidocaine 1 %; 2 mL bupivacaine 0.25 %; 40 mg methylPREDNISolone acetate 40 MG/ML      Clinical Data: No additional findings.   Subjective: Chief Complaint  Patient presents with  . Left Knee - Pain    HPI patient is a very pleasant 49 year old female who comes in today with left knee pain.  This has been ongoing for the past 8 months and is progressively worsened.  She notes that when her hip pain worsens it is her knee.  No specific injury to the left knee.  The majority of her pain is retropatellar.  She describes this as a constant ache with occasional walking.  She does have increased pain going up and down stairs.  She takes Aleve and oxycodone without relief of symptoms.  She occasionally notes pins-and-needles sensations to the left foot as well.  She does have a history of advanced generative joint disease to the left hip and is currently working at losing weight in order to have a total hip  replacement.  She also has a history of having had 6 surgeries on her back with the last 2 being fusions.  No previous cortisone injection to the left knee.  Review of Systems as detailed in HPI.  All others reviewed and are negative.   Objective: Vital Signs: Ht 5\' 4"  (1.626 m)   Wt 276 lb 3.2 oz (125.3 kg)   BMI 47.41 kg/m   Physical Exam well-developed and well-nourished female no acute distress.  Alert and oriented x3.  Ortho Exam examination of the left knee shows no effusion.  Range of motion 0 to 100 degrees.  Moderate patellofemoral crepitus.  Moderate tenderness to the medial joint line.  Markedly positive logroll mildly positive straight leg raise.  She is neurovascularly intact distally.  Specialty Comments:  No specialty comments available.  Imaging: XR KNEE 3 VIEW LEFT  Result Date: 05/05/2020 Minimal tricompartmental degenerative changes    PMFS History: Patient Active Problem List   Diagnosis Date Noted  . Type 2 diabetes mellitus with hyperglycemia, without long-term current use of insulin ([place]) 04/02/2020  . Peripheral edema 04/02/2020  . COVID-19 07/09/2019  . Left hip pain 04/30/2019  . Otitis externa of left ear 04/30/2019  . Cholesteatoma of left ear 04/30/2019  . Dysfunction  of left eustachian tube 04/30/2019  . Left ear pain 04/30/2019  . Sciatica 04/19/2019  . Class 3 severe obesity due to excess calories with serious comorbidity and body mass index (BMI) of 45.0 to 49.9 in adult ([place]) 04/19/2019  . Post-nasal drip 01/10/2019  . Allergic cough 01/10/2019  . Obstructive sleep apnea syndrome 12/11/2018  . Acute frontal sinusitis 12/10/2018  . Primary osteoarthritis of left hip 11/13/2018  . Body mass index 45.0-49.9, adult ([place]) 11/13/2018  . Morbid obesity ([place]) 11/13/2018  . Elevated cholesterol 11/12/2018  . Essential hypertension 11/12/2018  . Allergic rhinitis due to pollen 11/12/2018  . Reactive airway disease 11/12/2018  . Breast pain,  right 07/17/2018  . Prediabetes 07/17/2018  . Refused influenza vaccine 07/17/2018  . Chronic nonintractable headache 02/12/2018  . Dysuria 02/12/2018  . Localized edema 02/12/2018  . Hospital discharge follow-up 01/23/2018  . Bladder problem 01/05/2018  . Depression 01/05/2018  . Insomnia 01/05/2018  . Hepatitis C infection 12/01/2015  . RLS (restless legs syndrome) 10/23/2015  . Elevated liver enzymes 10/22/2015  . Recurrent major depression-severe ([place]) 04/17/2015  . GAD (generalized anxiety disorder) 04/15/2015  . Bipolar depression ([place]) 04/15/2015  . Severe recurrent major depression w/psychotic features, mood-congruent ([place]) 04/14/2015  . Anxiety disorder 04/14/2015  . Major depressive disorder, recurrent severe without psychotic features ([place]) 04/14/2015  . Suicidal ideations   . Chronic LBP 01/02/2015  . Adult hypothyroidism 06/25/2012  . Cobalamin deficiency 06/25/2012  . Fatigue 06/25/2012  . Chronic arthralgias of knees and hips 06/25/2012  . Leukocytosis 06/25/2012  . Vitamin D deficiency 06/25/2012   Past Medical History:  Diagnosis Date  . ADHD (attention deficit hyperactivity disorder)   . Anemia    with pregnancy  . Anxiety   . Arthritis   . Bipolar disorder ([place])   . Chronic back pain greater than 3 months duration   . Depression   . Dysuria    has to have self in and out cath   . Eroded bladder suspension mesh ([place])   . Hepatitis    C  . Hypothyroidism   . Leg pain, bilateral   . Obsessive-compulsive disorder   . Rosacea   . Sciatic nerve pain   . Social anxiety disorder   . Thyroid disease     Family History  Problem Relation Age of Onset  . Schizophrenia Father   . Schizophrenia Cousin   . Hyperthyroidism Mother   . Hypothyroidism Maternal Grandmother     Past Surgical History:  Procedure Laterality Date  . BACK SURGERY     total of 6 back surgeries  . bladder mesh  2009  . DILITATION & CURRETTAGE/HYSTROSCOPY WITH NOVASURE ABLATION  N/A 08/19/2016   Procedure: DILATATION & CURETTAGE WITH NOVASURE ABLATION;  Surgeon: [name], MD;  Location: [facility];  Service: Gynecology;  Laterality: N/A;  . EUS N/A 09/09/2015   Procedure: UPPER ENDOSCOPIC ULTRASOUND (EUS) RADIAL;  Surgeon: [name], MD;  Location: [facility];  Service: Endoscopy;  Laterality: N/A;  . EYE SURGERY    . INCONTINENCE SURGERY    . SPINAL FUSION  2008   times two  . TUBAL LIGATION  2006   Social History   Occupational History  . Not on file  Tobacco Use  . Smoking status: Light Tobacco Smoker    Packs/day: 1.00    Years: 10.00    Pack years: 10.00    Types: Cigarettes    Last attempt to quit: 02/09/2016    Years since  quitting: 4.2  . Smokeless tobacco: Never Used  . Tobacco comment: use vapor  Vaping Use  . Vaping Use: Never used  Substance and Sexual Activity  . Alcohol use: No  . Drug use: No  . Sexual activity: Yes

## 2020-05-06 DIAGNOSIS — G8929 Other chronic pain: Secondary | ICD-10-CM | POA: Diagnosis not present

## 2020-05-06 DIAGNOSIS — M25552 Pain in left hip: Secondary | ICD-10-CM | POA: Diagnosis not present

## 2020-05-06 DIAGNOSIS — M1612 Unilateral primary osteoarthritis, left hip: Secondary | ICD-10-CM | POA: Diagnosis not present

## 2020-05-12 ENCOUNTER — Ambulatory Visit (INDEPENDENT_AMBULATORY_CARE_PROVIDER_SITE_OTHER): Payer: PPO | Admitting: Family Medicine

## 2020-05-12 ENCOUNTER — Encounter: Payer: Self-pay | Admitting: Family Medicine

## 2020-05-12 ENCOUNTER — Ambulatory Visit: Payer: PPO

## 2020-05-12 ENCOUNTER — Other Ambulatory Visit: Payer: Self-pay

## 2020-05-12 VITALS — BP 124/70 | HR 83 | Temp 97.0°F | Ht 64.0 in | Wt 268.0 lb

## 2020-05-12 DIAGNOSIS — N3941 Urge incontinence: Secondary | ICD-10-CM

## 2020-05-12 DIAGNOSIS — R319 Hematuria, unspecified: Secondary | ICD-10-CM | POA: Diagnosis not present

## 2020-05-12 LAB — URINALYSIS, ROUTINE W REFLEX MICROSCOPIC
Bilirubin Urine: NEGATIVE
Ketones, ur: NEGATIVE
Leukocytes,Ua: NEGATIVE
Nitrite: NEGATIVE
Specific Gravity, Urine: <=1.005 — AB (ref 1.000–1.030)
Total Protein, Urine: NEGATIVE
Urine Glucose: NEGATIVE
Urobilinogen, UA: 0.2 (ref 0.0–1.0)
WBC, UA: NONE SEEN (ref 0–?)
pH: 6 (ref 5.0–8.0)

## 2020-05-12 NOTE — Progress Notes (Signed)
Established Patient Office Visit  Subjective:  Patient ID: [name], female    DOB: 12-Feb-1971  Age: 49 y.o. MRN: [id number]  CC:  Chief Complaint  Patient presents with  . Acute Visit    blood in urine x 2 days. no burning sensation and low back pain.    HPI [name] presents for a 2-day history of hematuria.  There has been no burning with urination.  She has a longstanding history of what sounds like urge incontinence.  She describes frequent urge to urinate and does not always make it to the bathroom.  She wears depends.  It sounds as though she had had mesh placed in the past that has eroded through the vaginal wall.  Despite her frequency of urination she is not certain that she can supply me with a urine sample today but she will try.  She had a similar episode a few months ago.  She is a smoker.  She is status post uterine ablation.  She feels certain that the blood is coming from her urethra as opposed to her vagina.  Past Medical History:  Diagnosis Date  . ADHD (attention deficit hyperactivity disorder)   . Anemia    with pregnancy  . Anxiety   . Arthritis   . Bipolar disorder ([place])   . Chronic back pain greater than 3 months duration   . Depression   . Dysuria    has to have self in and out cath   . Eroded bladder suspension mesh ([place])   . Hepatitis    C  . Hypothyroidism   . Leg pain, bilateral   . Obsessive-compulsive disorder   . Rosacea   . Sciatic nerve pain   . Social anxiety disorder   . Thyroid disease     Past Surgical History:  Procedure Laterality Date  . BACK SURGERY     total of 6 back surgeries  . bladder mesh  2009  . DILITATION & CURRETTAGE/HYSTROSCOPY WITH NOVASURE ABLATION N/A 08/19/2016   Procedure: DILATATION & CURETTAGE WITH NOVASURE ABLATION;  Surgeon: [name], MD;  Location: [facility];  Service: Gynecology;  Laterality: N/A;  . EUS N/A 09/09/2015   Procedure: UPPER ENDOSCOPIC ULTRASOUND (EUS) RADIAL;  Surgeon: [name], MD;  Location: [facility];  Service: Endoscopy;  Laterality: N/A;  . EYE SURGERY    . INCONTINENCE SURGERY    . SPINAL FUSION  2008   times two  . TUBAL LIGATION  2006    Family History  Problem Relation Age of Onset  . Schizophrenia Father   . Schizophrenia Cousin   . Hyperthyroidism Mother   . Hypothyroidism Maternal Grandmother     Social History   Socioeconomic History  . Marital status: Married    Spouse name: Not on file  . Number of children: Not on file  . Years of education: Not on file  . Highest education level: Not on file  Occupational History  . Not on file  Tobacco Use  . Smoking status: Light Tobacco Smoker    Packs/day: 1.00    Years: 10.00    Pack years: 10.00    Types: Cigarettes    Last attempt to quit: 02/09/2016    Years since quitting: 4.2  . Smokeless tobacco: Never Used  . Tobacco comment: use vapor  Vaping Use  . Vaping Use: Never used  Substance and Sexual Activity  . Alcohol use: No  . Drug use: No  . Sexual  activity: Yes  Other Topics Concern  . Not on file  Social History Narrative  . Not on file   Social Determinants of Health   Financial Resource Strain:   . Difficulty of Paying Living Expenses: Not on file  Food Insecurity:   . Worried About Charity fundraiser in the Last Year: Not on file  . Ran Out of Food in the Last Year: Not on file  Transportation Needs:   . Lack of Transportation (Medical): Not on file  . Lack of Transportation (Non-Medical): Not on file  Physical Activity:   . Days of Exercise per Week: Not on file  . Minutes of Exercise per Session: Not on file  Stress:   . Feeling of Stress : Not on file  Social Connections:   . Frequency of Communication with Friends and Family: Not on file  . Frequency of Social Gatherings with Friends and Family: Not on file  . Attends Religious Services: Not on file  . Active Member of Clubs or Organizations: Not on file  . Attends Archivist Meetings:  Not on file  . Marital Status: Not on file  Intimate Partner Violence:   . Fear of Current or Ex-Partner: Not on file  . Emotionally Abused: Not on file  . Physically Abused: Not on file  . Sexually Abused: Not on file    Outpatient Medications Prior to Visit  Medication Sig Dispense Refill  . ALPRAZolam (XANAX) 0.5 MG tablet Take 0.5 mg by mouth 2 (two) times daily as needed.    . ARIPiprazole (ABILIFY) 5 MG tablet Take 1 tablet (5 mg total) by mouth daily. 30 tablet 2  . atorvastatin (LIPITOR) 20 MG tablet TAKE 1 TABLET(20 MG) BY MOUTH DAILY 100 tablet 0  . blood glucose meter kit and supplies KIT Dispense based on patient and insurance preference. Check glucose before breakfast. ICD: E11.65 1 each 0  . clomiPRAMINE (ANAFRANIL) 75 MG capsule Take 75 mg by mouth at bedtime.    Marland Kitchen FINACEA 15 % FOAM Apply 1 application topically 2 (two) times daily.     Marland Kitchen gabapentin (NEURONTIN) 300 MG capsule TAKE 1 CAPSULE(300 MG) BY MOUTH THREE TIMES DAILY AS NEEDED 90 capsule 0  . lamoTRIgine (LAMICTAL) 150 MG tablet Take 150 mg by mouth 2 (two) times daily.    Marland Kitchen levothyroxine (SYNTHROID) 75 MCG tablet TAKE 1 TABLET(75 MCG) BY MOUTH DAILY 90 tablet 0  . lisinopril (ZESTRIL) 10 MG tablet TAKE 1 TABLET(10 MG) BY MOUTH DAILY 100 tablet 1  . metFORMIN (GLUCOPHAGE) 500 MG tablet Take 1 tablet (500 mg total) by mouth 2 (two) times daily with a meal. 60 tablet 5  . methylphenidate (RITALIN) 10 MG tablet Take 1 tablet by mouth 2 (two) times daily.    Marland Kitchen oxyCODONE-acetaminophen (PERCOCET) 7.5-325 MG tablet Take 1 tablet by mouth every 6 (six) hours as needed.    . phentermine 15 MG capsule Take by mouth.    Marland Kitchen HYDROcodone-acetaminophen (NORCO) 10-325 MG tablet Take 1 tablet by mouth 4 (four) times daily as needed. (Patient not taking: Reported on 05/12/2020)    . metroNIDAZOLE (FLAGYL) 500 MG tablet Take 1 tablet (500 mg total) by mouth 2 (two) times daily. (Patient not taking: Reported on 05/12/2020) 14 tablet 2   No  facility-administered medications prior to visit.    Allergies  Allergen Reactions  . Pregabalin Anaphylaxis  . Celebrex [Celecoxib] Nausea And Vomiting    Stomach pain  . Effexor [Venlafaxine Hcl] Other (  See Comments)    Serotonin toxicity  . Mirapex [Pramipexole]     Mirapex- Worsening "body jerks"  . Pristiq [Desvenlafaxine] Other (See Comments)    Serotonin toxicity   . Prozac [Fluoxetine Hcl] Swelling  . Symbyax [Olanzapine-Fluoxetine Hcl] Swelling  . Wellbutrin [Bupropion Hcl] Swelling    ROS Review of Systems  Constitutional: Negative.  Negative for fever.  Respiratory: Negative.   Cardiovascular: Negative.   Gastrointestinal: Negative for nausea and vomiting.  Genitourinary: Positive for frequency and hematuria. Negative for decreased urine volume, difficulty urinating, dysuria, flank pain, vaginal bleeding and vaginal discharge.  Allergic/Immunologic: Negative for immunocompromised state.  Neurological: Negative for weakness and numbness.  Psychiatric/Behavioral: Negative.       Objective:    Physical Exam Vitals and nursing note reviewed.  Constitutional:      General: She is not in acute distress.    Appearance: Normal appearance. She is obese. She is not ill-appearing, toxic-appearing or diaphoretic.  HENT:     Head: Normocephalic and atraumatic.     Right Ear: External ear normal.     Left Ear: External ear normal.  Eyes:     General: No scleral icterus.       Right eye: No discharge.        Left eye: No discharge.     Conjunctiva/sclera: Conjunctivae normal.  Cardiovascular:     Rate and Rhythm: Normal rate and regular rhythm.  Pulmonary:     Effort: Pulmonary effort is normal.     Breath sounds: Normal breath sounds.  Abdominal:     General: Bowel sounds are normal. There is no distension.     Palpations: There is no mass.     Tenderness: There is no abdominal tenderness. There is no right CVA tenderness, left CVA tenderness, guarding or rebound.      Hernia: No hernia is present.  Musculoskeletal:     Cervical back: Neck supple.  Lymphadenopathy:     Cervical: No cervical adenopathy.  Skin:    General: Skin is warm and dry.  Neurological:     Mental Status: She is alert and oriented to person, place, and time.  Psychiatric:        Mood and Affect: Mood normal.        Behavior: Behavior normal.     BP 124/70   Pulse 83   Temp (!) 97 F (36.1 C) (Temporal)   Ht 5' 4"  (1.626 m)   Wt 268 lb (121.6 kg)   SpO2 96%   BMI 46.00 kg/m  Wt Readings from Last 3 Encounters:  05/12/20 268 lb (121.6 kg)  05/05/20 276 lb 3.2 oz (125.3 kg)  05/01/20 273 lb 12.8 oz (124.2 kg)     Health Maintenance Due  Topic Date Due  . FOOT EXAM  Never done  . OPHTHALMOLOGY EXAM  Never done  . HIV Screening  Never done  . INFLUENZA VACCINE  04/05/2020    There are no preventive care reminders to display for this patient.  Lab Results  Component Value Date   TSH 3.56 04/02/2020   Lab Results  Component Value Date   WBC 20.3 (H) 12/30/2019   HGB 11.8 (L) 12/30/2019   HCT 37.2 12/30/2019   MCV 85.3 12/30/2019   PLT 333 12/30/2019   Lab Results  Component Value Date   NA 135 03/31/2020   K 4.0 03/31/2020   CO2 29 03/31/2020   GLUCOSE 252 (H) 03/31/2020   BUN 7 03/31/2020   CREATININE  0.69 03/31/2020   BILITOT 0.3 03/31/2020   ALKPHOS 87 03/31/2020   AST 15 03/31/2020   ALT 27 03/31/2020   PROT 6.2 03/31/2020   ALBUMIN 3.7 03/31/2020   CALCIUM 9.1 03/31/2020   ANIONGAP 10 12/30/2019   GFR 91.08 03/31/2020   Lab Results  Component Value Date   CHOL 166 11/12/2018   Lab Results  Component Value Date   HDL 35.80 (L) 11/12/2018   Lab Results  Component Value Date   LDLCALC 174 (H) 04/15/2015   Lab Results  Component Value Date   TRIG 221.0 (H) 11/12/2018   Lab Results  Component Value Date   CHOLHDL 5 11/12/2018   Lab Results  Component Value Date   HGBA1C 7.6 (H) 04/02/2020      Assessment & Plan:    Problem List Items Addressed This Visit    None    Visit Diagnoses    Urge incontinence    -  Primary   Relevant Orders   Ambulatory referral to Urology   Hematuria, unspecified type       Relevant Orders   Urinalysis, Routine w reflex microscopic   Urine Culture   Ambulatory referral to Urology   Basic metabolic panel   CBC      No orders of the defined types were placed in this encounter.   Follow-up: Return if symptoms worsen or fail to improve.    Libby Maw, MD

## 2020-05-13 LAB — BASIC METABOLIC PANEL
BUN: 12 mg/dL (ref 6–23)
CO2: 29 mEq/L (ref 19–32)
Calcium: 9.4 mg/dL (ref 8.4–10.5)
Chloride: 97 mEq/L (ref 96–112)
Creatinine, Ser: 0.76 mg/dL (ref 0.40–1.20)
GFR: 81.43 mL/min (ref >60.00)
Glucose, Bld: 93 mg/dL (ref 70–99)
Potassium: 3.8 mEq/L (ref 3.5–5.1)
Sodium: 133 mEq/L — ABNORMAL LOW (ref 135–145)

## 2020-05-13 LAB — URINE CULTURE
MICRO NUMBER:: 10917502
SPECIMEN QUALITY:: ADEQUATE

## 2020-05-13 LAB — CBC
HCT: 37.3 % (ref 36.0–46.0)
Hemoglobin: 12.2 g/dL (ref 12.0–15.0)
MCHC: 32.8 g/dL (ref 30.0–36.0)
MCV: 83.8 fl (ref 78.0–100.0)
Platelets: 320 10*3/uL (ref 150.0–400.0)
RBC: 4.45 Mil/uL (ref 3.87–5.11)
RDW: 17.2 % — ABNORMAL HIGH (ref 11.5–15.5)
WBC: 14.3 10*3/uL — ABNORMAL HIGH (ref 4.0–10.5)

## 2020-05-15 ENCOUNTER — Telehealth: Payer: Self-pay | Admitting: Family Medicine

## 2020-05-15 NOTE — Telephone Encounter (Signed)
Patient is calling to get her lab results. Please give her a call back at [phone].

## 2020-05-15 NOTE — Telephone Encounter (Signed)
Called patient

## 2020-05-19 ENCOUNTER — Ambulatory Visit: Payer: PPO

## 2020-05-19 DIAGNOSIS — I10 Essential (primary) hypertension: Secondary | ICD-10-CM | POA: Diagnosis not present

## 2020-05-19 DIAGNOSIS — M25552 Pain in left hip: Secondary | ICD-10-CM | POA: Diagnosis not present

## 2020-05-19 DIAGNOSIS — Z79899 Other long term (current) drug therapy: Secondary | ICD-10-CM | POA: Diagnosis not present

## 2020-05-19 DIAGNOSIS — E119 Type 2 diabetes mellitus without complications: Secondary | ICD-10-CM | POA: Diagnosis not present

## 2020-05-20 DIAGNOSIS — M1612 Unilateral primary osteoarthritis, left hip: Secondary | ICD-10-CM | POA: Diagnosis not present

## 2020-05-21 ENCOUNTER — Other Ambulatory Visit: Payer: Self-pay | Admitting: Family Medicine

## 2020-05-21 DIAGNOSIS — M5430 Sciatica, unspecified side: Secondary | ICD-10-CM

## 2020-05-22 ENCOUNTER — Telehealth: Payer: Self-pay | Admitting: Family Medicine

## 2020-05-22 DIAGNOSIS — M5430 Sciatica, unspecified side: Secondary | ICD-10-CM

## 2020-05-22 MED ORDER — GABAPENTIN 300 MG PO CAPS: 300.0000 mg | ORAL_CAPSULE | Freq: Three times a day (TID) | ORAL | 0 refills | Status: DC

## 2020-05-22 NOTE — Telephone Encounter (Signed)
Rx sent in

## 2020-05-22 NOTE — Telephone Encounter (Signed)
Patient is calling and requesting a refill for Gabapentin to be sent to [facility] on [address].

## 2020-05-26 ENCOUNTER — Ambulatory Visit: Payer: PPO

## 2020-05-28 DIAGNOSIS — F429 Obsessive-compulsive disorder, unspecified: Secondary | ICD-10-CM | POA: Diagnosis not present

## 2020-05-28 DIAGNOSIS — F3132 Bipolar disorder, current episode depressed, moderate: Secondary | ICD-10-CM | POA: Diagnosis not present

## 2020-05-28 DIAGNOSIS — F411 Generalized anxiety disorder: Secondary | ICD-10-CM | POA: Diagnosis not present

## 2020-06-01 DIAGNOSIS — E119 Type 2 diabetes mellitus without complications: Secondary | ICD-10-CM | POA: Diagnosis not present

## 2020-06-01 DIAGNOSIS — Z6841 Body Mass Index (BMI) 40.0 and over, adult: Secondary | ICD-10-CM | POA: Diagnosis not present

## 2020-06-09 DIAGNOSIS — Z713 Dietary counseling and surveillance: Secondary | ICD-10-CM | POA: Diagnosis not present

## 2020-06-09 DIAGNOSIS — Z6841 Body Mass Index (BMI) 40.0 and over, adult: Secondary | ICD-10-CM | POA: Diagnosis not present

## 2020-06-16 ENCOUNTER — Ambulatory Visit (INDEPENDENT_AMBULATORY_CARE_PROVIDER_SITE_OTHER): Payer: PPO | Admitting: Orthopaedic Surgery

## 2020-06-16 ENCOUNTER — Encounter: Payer: Self-pay | Admitting: Orthopaedic Surgery

## 2020-06-16 VITALS — Ht 65.5 in | Wt 259.0 lb

## 2020-06-16 DIAGNOSIS — M1612 Unilateral primary osteoarthritis, left hip: Secondary | ICD-10-CM

## 2020-06-16 DIAGNOSIS — E119 Type 2 diabetes mellitus without complications: Secondary | ICD-10-CM | POA: Diagnosis not present

## 2020-06-16 DIAGNOSIS — Z79899 Other long term (current) drug therapy: Secondary | ICD-10-CM | POA: Diagnosis not present

## 2020-06-16 DIAGNOSIS — M25552 Pain in left hip: Secondary | ICD-10-CM | POA: Diagnosis not present

## 2020-06-16 DIAGNOSIS — E7800 Pure hypercholesterolemia, unspecified: Secondary | ICD-10-CM | POA: Diagnosis not present

## 2020-06-16 DIAGNOSIS — I10 Essential (primary) hypertension: Secondary | ICD-10-CM | POA: Diagnosis not present

## 2020-06-16 NOTE — Progress Notes (Signed)
Office Visit Note   Patient: [name]           Date of Birth: 08/09/1971           MRN: [id number] Visit Date: 06/16/2020              Requested by: Libby Maw, MD [address] [place],  [place] [address] PCP: Libby Maw, MD   Assessment & Plan: Visit Diagnoses:  1. Unilateral primary osteoarthritis, left hip     Plan: Impression is left hip chondromalacia and degenerative labral tear.  She will continue to make all efforts at weight loss for the next couple months and is shooting for a left hip replacement sometime in January.  In terms of her back she will get back in touch with her pain management doctor about other treatment options.  Follow-Up Instructions: Return if symptoms worsen or fail to improve.   Orders:  No orders of the defined types were placed in this encounter.  No orders of the defined types were placed in this encounter.     Procedures: No procedures performed   Clinical Data: No additional findings.   Subjective: Chief Complaint  Patient presents with  . Left Hip - Pain  . Lower Back - Pain    [name] returns today for follow-up of chronic left hip pain.  She has had a previous MR arthrogram which shows high-grade chondromalacia of the left hip joint with degenerative labral tears.  She has been trying to lose weight so that she can be a candidate for hip replacement.  She has had multiple back surgeries and recently had a radiofrequency ablation which she feels did not work.   Review of Systems  Constitutional: Negative.   HENT: Negative.   Eyes: Negative.   Respiratory: Negative.   Cardiovascular: Negative.   Endocrine: Negative.   Musculoskeletal: Negative.   Neurological: Negative.   Hematological: Negative.   Psychiatric/Behavioral: Negative.   All other systems reviewed and are negative.    Objective: Vital Signs: Ht 5' 5.5" (1.664 m)   Wt 259 lb (117.5 kg)   BMI 42.44 kg/m   Physical  Exam Vitals and nursing note reviewed.  Constitutional:      Appearance: She is well-developed.  Pulmonary:     Effort: Pulmonary effort is normal.  Skin:    General: Skin is warm.     Capillary Refill: Capillary refill takes less than 2 seconds.  Neurological:     Mental Status: She is alert and oriented to person, place, and time.  Psychiatric:        Behavior: Behavior normal.        Thought Content: Thought content normal.        Judgment: Judgment normal.     Ortho Exam Left hip exam is unchanged. Specialty Comments:  No specialty comments available.  Imaging: No results found.   PMFS History: Patient Active Problem List   Diagnosis Date Noted  . Type 2 diabetes mellitus with hyperglycemia, without long-term current use of insulin ([place]) 04/02/2020  . Peripheral edema 04/02/2020  . COVID-19 07/09/2019  . Left hip pain 04/30/2019  . Otitis externa of left ear 04/30/2019  . Cholesteatoma of left ear 04/30/2019  . Dysfunction of left eustachian tube 04/30/2019  . Left ear pain 04/30/2019  . Sciatica 04/19/2019  . Class 3 severe obesity due to excess calories with serious comorbidity and body mass index (BMI) of 45.0 to 49.9 in adult ([place]) 04/19/2019  .  Post-nasal drip 01/10/2019  . Allergic cough 01/10/2019  . Obstructive sleep apnea syndrome 12/11/2018  . Acute frontal sinusitis 12/10/2018  . Primary osteoarthritis of left hip 11/13/2018  . Body mass index 45.0-49.9, adult ([place]) 11/13/2018  . Morbid obesity ([place]) 11/13/2018  . Elevated cholesterol 11/12/2018  . Essential hypertension 11/12/2018  . Allergic rhinitis due to pollen 11/12/2018  . Reactive airway disease 11/12/2018  . Breast pain, right 07/17/2018  . Prediabetes 07/17/2018  . Refused influenza vaccine 07/17/2018  . Chronic nonintractable headache 02/12/2018  . Dysuria 02/12/2018  . Localized edema 02/12/2018  . Hospital discharge follow-up 01/23/2018  . Bladder problem 01/05/2018  . Depression  01/05/2018  . Insomnia 01/05/2018  . Hepatitis C infection 12/01/2015  . RLS (restless legs syndrome) 10/23/2015  . Elevated liver enzymes 10/22/2015  . Recurrent major depression-severe ([place]) 04/17/2015  . GAD (generalized anxiety disorder) 04/15/2015  . Bipolar depression ([place]) 04/15/2015  . Severe recurrent major depression w/psychotic features, mood-congruent ([place]) 04/14/2015  . Anxiety disorder 04/14/2015  . Major depressive disorder, recurrent severe without psychotic features ([place]) 04/14/2015  . Suicidal ideations   . Chronic LBP 01/02/2015  . Adult hypothyroidism 06/25/2012  . Cobalamin deficiency 06/25/2012  . Fatigue 06/25/2012  . Chronic arthralgias of knees and hips 06/25/2012  . Leukocytosis 06/25/2012  . Vitamin D deficiency 06/25/2012   Past Medical History:  Diagnosis Date  . ADHD (attention deficit hyperactivity disorder)   . Anemia    with pregnancy  . Anxiety   . Arthritis   . Bipolar disorder ([place])   . Chronic back pain greater than 3 months duration   . Depression   . Dysuria    has to have self in and out cath   . Eroded bladder suspension mesh ([place])   . Hepatitis    C  . Hypothyroidism   . Leg pain, bilateral   . Obsessive-compulsive disorder   . Rosacea   . Sciatic nerve pain   . Social anxiety disorder   . Thyroid disease     Family History  Problem Relation Age of Onset  . Schizophrenia Father   . Schizophrenia Cousin   . Hyperthyroidism Mother   . Hypothyroidism Maternal Grandmother     Past Surgical History:  Procedure Laterality Date  . BACK SURGERY     total of 6 back surgeries  . bladder mesh  2009  . DILITATION & CURRETTAGE/HYSTROSCOPY WITH NOVASURE ABLATION N/A 08/19/2016   Procedure: DILATATION & CURETTAGE WITH NOVASURE ABLATION;  Surgeon: [name], MD;  Location: [facility];  Service: Gynecology;  Laterality: N/A;  . EUS N/A 09/09/2015   Procedure: UPPER ENDOSCOPIC ULTRASOUND (EUS) RADIAL;  Surgeon: [name], MD;   Location: [facility];  Service: Endoscopy;  Laterality: N/A;  . EYE SURGERY    . INCONTINENCE SURGERY    . SPINAL FUSION  2008   times two  . TUBAL LIGATION  2006   Social History   Occupational History  . Not on file  Tobacco Use  . Smoking status: Light Tobacco Smoker    Packs/day: 1.00    Years: 10.00    Pack years: 10.00    Types: Cigarettes    Last attempt to quit: 02/09/2016    Years since quitting: 4.3  . Smokeless tobacco: Never Used  . Tobacco comment: use vapor  Vaping Use  . Vaping Use: Never used  Substance and Sexual Activity  . Alcohol use: No  . Drug use: No  . Sexual activity: Yes

## 2020-06-18 DIAGNOSIS — Z6841 Body Mass Index (BMI) 40.0 and over, adult: Secondary | ICD-10-CM | POA: Diagnosis not present

## 2020-06-18 DIAGNOSIS — E669 Obesity, unspecified: Secondary | ICD-10-CM | POA: Diagnosis not present

## 2020-06-23 ENCOUNTER — Other Ambulatory Visit: Payer: Self-pay | Admitting: Family Medicine

## 2020-06-23 DIAGNOSIS — M5430 Sciatica, unspecified side: Secondary | ICD-10-CM

## 2020-06-23 NOTE — Telephone Encounter (Signed)
Last OV 05/12/20 Last fill 05/22/20  #90/0

## 2020-06-24 DIAGNOSIS — M545 Low back pain, unspecified: Secondary | ICD-10-CM | POA: Diagnosis not present

## 2020-06-24 DIAGNOSIS — M25562 Pain in left knee: Secondary | ICD-10-CM | POA: Diagnosis not present

## 2020-06-24 DIAGNOSIS — M1612 Unilateral primary osteoarthritis, left hip: Secondary | ICD-10-CM | POA: Diagnosis not present

## 2020-06-24 DIAGNOSIS — M5187 Other intervertebral disc disorders, lumbosacral region: Secondary | ICD-10-CM | POA: Diagnosis not present

## 2020-06-24 DIAGNOSIS — M4316 Spondylolisthesis, lumbar region: Secondary | ICD-10-CM | POA: Diagnosis not present

## 2020-06-24 DIAGNOSIS — G8929 Other chronic pain: Secondary | ICD-10-CM | POA: Diagnosis not present

## 2020-06-24 DIAGNOSIS — M5136 Other intervertebral disc degeneration, lumbar region: Secondary | ICD-10-CM | POA: Diagnosis not present

## 2020-06-25 DIAGNOSIS — C44529 Squamous cell carcinoma of skin of other part of trunk: Secondary | ICD-10-CM | POA: Diagnosis not present

## 2020-06-25 DIAGNOSIS — B078 Other viral warts: Secondary | ICD-10-CM | POA: Diagnosis not present

## 2020-06-25 DIAGNOSIS — D485 Neoplasm of uncertain behavior of skin: Secondary | ICD-10-CM | POA: Diagnosis not present

## 2020-06-25 DIAGNOSIS — L820 Inflamed seborrheic keratosis: Secondary | ICD-10-CM | POA: Diagnosis not present

## 2020-06-25 DIAGNOSIS — L720 Epidermal cyst: Secondary | ICD-10-CM | POA: Diagnosis not present

## 2020-06-25 DIAGNOSIS — L818 Other specified disorders of pigmentation: Secondary | ICD-10-CM | POA: Diagnosis not present

## 2020-06-29 DIAGNOSIS — H5213 Myopia, bilateral: Secondary | ICD-10-CM | POA: Diagnosis not present

## 2020-06-29 DIAGNOSIS — E119 Type 2 diabetes mellitus without complications: Secondary | ICD-10-CM | POA: Diagnosis not present

## 2020-06-29 DIAGNOSIS — H524 Presbyopia: Secondary | ICD-10-CM | POA: Diagnosis not present

## 2020-06-29 LAB — HM DIABETES EYE EXAM

## 2020-06-30 DIAGNOSIS — Z713 Dietary counseling and surveillance: Secondary | ICD-10-CM | POA: Diagnosis not present

## 2020-06-30 DIAGNOSIS — Z6841 Body Mass Index (BMI) 40.0 and over, adult: Secondary | ICD-10-CM | POA: Diagnosis not present

## 2020-07-03 ENCOUNTER — Other Ambulatory Visit: Payer: Self-pay | Admitting: Gastroenterology

## 2020-07-03 DIAGNOSIS — B1920 Unspecified viral hepatitis C without hepatic coma: Secondary | ICD-10-CM

## 2020-07-03 DIAGNOSIS — R748 Abnormal levels of other serum enzymes: Secondary | ICD-10-CM | POA: Diagnosis not present

## 2020-07-03 DIAGNOSIS — K7402 Hepatic fibrosis, advanced fibrosis: Secondary | ICD-10-CM

## 2020-07-03 DIAGNOSIS — Z1211 Encounter for screening for malignant neoplasm of colon: Secondary | ICD-10-CM | POA: Diagnosis not present

## 2020-07-06 ENCOUNTER — Telehealth: Payer: PPO | Admitting: Family Medicine

## 2020-07-07 ENCOUNTER — Encounter: Payer: Self-pay | Admitting: Family Medicine

## 2020-07-07 ENCOUNTER — Telehealth (INDEPENDENT_AMBULATORY_CARE_PROVIDER_SITE_OTHER): Payer: PPO | Admitting: Family Medicine

## 2020-07-07 ENCOUNTER — Other Ambulatory Visit: Payer: PPO

## 2020-07-07 ENCOUNTER — Other Ambulatory Visit: Payer: Self-pay

## 2020-07-07 VITALS — Ht 65.0 in | Wt 250.0 lb

## 2020-07-07 DIAGNOSIS — E1165 Type 2 diabetes mellitus with hyperglycemia: Secondary | ICD-10-CM | POA: Diagnosis not present

## 2020-07-07 DIAGNOSIS — I10 Essential (primary) hypertension: Secondary | ICD-10-CM

## 2020-07-07 DIAGNOSIS — E039 Hypothyroidism, unspecified: Secondary | ICD-10-CM

## 2020-07-07 DIAGNOSIS — E7800 Pure hypercholesterolemia, unspecified: Secondary | ICD-10-CM

## 2020-07-07 LAB — LDL CHOLESTEROL, DIRECT: Direct LDL: 79 mg/dL

## 2020-07-07 LAB — COMPREHENSIVE METABOLIC PANEL
ALT: 18 U/L (ref 0–35)
AST: 14 U/L (ref 0–37)
Albumin: 4.1 g/dL (ref 3.5–5.2)
Alkaline Phosphatase: 81 U/L (ref 39–117)
BUN: 9 mg/dL (ref 6–23)
CO2: 29 mEq/L (ref 19–32)
Calcium: 9.6 mg/dL (ref 8.4–10.5)
Chloride: 99 mEq/L (ref 96–112)
Creatinine, Ser: 0.73 mg/dL (ref 0.40–1.20)
GFR: 97.84 mL/min (ref >60.00)
Glucose, Bld: 83 mg/dL (ref 70–99)
Potassium: 3.9 mEq/L (ref 3.5–5.1)
Sodium: 135 mEq/L (ref 135–145)
Total Bilirubin: 0.4 mg/dL (ref 0.2–1.2)
Total Protein: 6.9 g/dL (ref 6.0–8.3)

## 2020-07-07 LAB — CBC
HCT: 39.1 % (ref 36.0–46.0)
Hemoglobin: 12.9 g/dL (ref 12.0–15.0)
MCHC: 33 g/dL (ref 30.0–36.0)
MCV: 80.4 fl (ref 78.0–100.0)
Platelets: 347 10*3/uL (ref 150.0–400.0)
RBC: 4.86 Mil/uL (ref 3.87–5.11)
RDW: 17.6 % — ABNORMAL HIGH (ref 11.5–15.5)
WBC: 14.3 10*3/uL — ABNORMAL HIGH (ref 4.0–10.5)

## 2020-07-07 LAB — HEMOGLOBIN A1C: Hgb A1c MFr Bld: 6.1 % (ref 4.6–6.5)

## 2020-07-07 LAB — TSH: TSH: 2.18 u[IU]/mL (ref 0.35–4.50)

## 2020-07-07 MED ORDER — FREESTYLE SYSTEM KIT: 1.0000 | PACK | 1 refills | Status: DC | PRN

## 2020-07-07 MED ORDER — ATORVASTATIN CALCIUM 20 MG PO TABS: ORAL_TABLET | ORAL | 3 refills | Status: DC

## 2020-07-07 MED ORDER — ATORVASTATIN CALCIUM 20 MG PO TABS: ORAL_TABLET | ORAL | 0 refills | Status: DC

## 2020-07-07 NOTE — Progress Notes (Signed)
Established Patient Office Visit  Subjective:  Patient ID: [name], female    DOB: 04-19-1971  Age: 49 y.o. MRN: [id number]  CC:  Chief Complaint  Patient presents with  . Follow-up    3 month follow up on DM, patient states that her left great toe has no feeling x 3 months, would like a refill on atrovastatin 90 day supply.     HPI [name] presents for follow-up of diabetes, hypertension and elevated cholesterol.  Checking her fasting sugars frequently and they have been running in the less than 150 range she tells me.  Tolerating the Metformin well.  Blood pressure controlled with lisinopril.  She is having trouble filling her atorvastatin but has been taking it daily without issue.  She is having numbness in her great toe.  She continues with her Metformin.  She is having a squamous cell cancer removed from her back next week by dermatology.  Past Medical History:  Diagnosis Date  . ADHD (attention deficit hyperactivity disorder)   . Anemia    with pregnancy  . Anxiety   . Arthritis   . Bipolar disorder ([address])   . Chronic back pain greater than 3 months duration   . Depression   . Dysuria    has to have self in and out cath   . Eroded bladder suspension mesh ([place])   . Hepatitis    C  . Hypothyroidism   . Leg pain, bilateral   . Obsessive-compulsive disorder   . Rosacea   . Sciatic nerve pain   . Social anxiety disorder   . Thyroid disease     Past Surgical History:  Procedure Laterality Date  . BACK SURGERY     total of 6 back surgeries  . bladder mesh  2009  . DILITATION & CURRETTAGE/HYSTROSCOPY WITH NOVASURE ABLATION N/A 08/19/2016   Procedure: DILATATION & CURETTAGE WITH NOVASURE ABLATION;  Surgeon: [name], MD;  Location: [facility];  Service: Gynecology;  Laterality: N/A;  . EUS N/A 09/09/2015   Procedure: UPPER ENDOSCOPIC ULTRASOUND (EUS) RADIAL;  Surgeon: [name], MD;  Location: [facility];  Service: Endoscopy;  Laterality: N/A;  .  EYE SURGERY    . INCONTINENCE SURGERY    . SPINAL FUSION  2008   times two  . TUBAL LIGATION  2006    Family History  Problem Relation Age of Onset  . Schizophrenia Father   . Schizophrenia Cousin   . Hyperthyroidism Mother   . Hypothyroidism Maternal Grandmother     Social History   Socioeconomic History  . Marital status: Married    Spouse name: Not on file  . Number of children: Not on file  . Years of education: Not on file  . Highest education level: Not on file  Occupational History  . Not on file  Tobacco Use  . Smoking status: Light Tobacco Smoker    Packs/day: 1.00    Years: 10.00    Pack years: 10.00    Types: Cigarettes    Last attempt to quit: 02/09/2016    Years since quitting: 4.4  . Smokeless tobacco: Never Used  . Tobacco comment: use vapor  Vaping Use  . Vaping Use: Never used  Substance and Sexual Activity  . Alcohol use: No  . Drug use: No  . Sexual activity: Yes  Other Topics Concern  . Not on file  Social History Narrative  . Not on file   Social Determinants of Health  Financial Resource Strain:   . Difficulty of Paying Living Expenses: Not on file  Food Insecurity:   . Worried About Running Out of Food in the Last Year: Not on file  . Ran Out of Food in the Last Year: Not on file  Transportation Needs:   . Lack of Transportation (Medical): Not on file  . Lack of Transportation (Non-Medical): Not on file  Physical Activity:   . Days of Exercise per Week: Not on file  . Minutes of Exercise per Session: Not on file  Stress:   . Feeling of Stress : Not on file  Social Connections:   . Frequency of Communication with Friends and Family: Not on file  . Frequency of Social Gatherings with Friends and Family: Not on file  . Attends Religious Services: Not on file  . Active Member of Clubs or Organizations: Not on file  . Attends Club or Organization Meetings: Not on file  . Marital Status: Not on file  Intimate Partner Violence:   .  Fear of Current or Ex-Partner: Not on file  . Emotionally Abused: Not on file  . Physically Abused: Not on file  . Sexually Abused: Not on file    Outpatient Medications Prior to Visit  Medication Sig Dispense Refill  . ALPRAZolam (XANAX) 0.5 MG tablet Take 0.5 mg by mouth 2 (two) times daily as needed.    . ARIPiprazole (ABILIFY) 5 MG tablet Take 1 tablet (5 mg total) by mouth daily. 30 tablet 2  . blood glucose meter kit and supplies KIT Dispense based on patient and insurance preference. Check glucose before breakfast. ICD: E11.65 1 each 0  . clomiPRAMINE (ANAFRANIL) 75 MG capsule Take 75 mg by mouth at bedtime.    . FINACEA 15 % FOAM Apply 1 application topically 2 (two) times daily.     . gabapentin (NEURONTIN) 300 MG capsule TAKE 1 CAPSULE(300 MG) BY MOUTH THREE TIMES DAILY 90 capsule 2  . HYDROcodone-acetaminophen (NORCO) 10-325 MG tablet Take 1 tablet by mouth 4 (four) times daily as needed.     . ibuprofen (ADVIL) 800 MG tablet Take by mouth.    . lamoTRIgine (LAMICTAL) 150 MG tablet Take 150 mg by mouth 2 (two) times daily.    . levothyroxine (SYNTHROID) 75 MCG tablet TAKE 1 TABLET(75 MCG) BY MOUTH DAILY 90 tablet 0  . lisinopril (ZESTRIL) 10 MG tablet TAKE 1 TABLET(10 MG) BY MOUTH DAILY 100 tablet 1  . metFORMIN (GLUCOPHAGE) 500 MG tablet Take 1 tablet (500 mg total) by mouth 2 (two) times daily with a meal. 60 tablet 5  . methylphenidate (RITALIN) 10 MG tablet Take 1 tablet by mouth 2 (two) times daily.    . atorvastatin (LIPITOR) 20 MG tablet TAKE 1 TABLET(20 MG) BY MOUTH DAILY 100 tablet 0  . metroNIDAZOLE (FLAGYL) 500 MG tablet Take 1 tablet (500 mg total) by mouth 2 (two) times daily. (Patient not taking: Reported on 07/07/2020) 14 tablet 2  . oxyCODONE-acetaminophen (PERCOCET) 7.5-325 MG tablet Take 1 tablet by mouth every 6 (six) hours as needed. (Patient not taking: Reported on 07/07/2020)    . phentermine 15 MG capsule Take by mouth.     No facility-administered  medications prior to visit.    Allergies  Allergen Reactions  . Pregabalin Anaphylaxis  . Celebrex [Celecoxib] Nausea And Vomiting    Stomach pain  . Effexor [Venlafaxine Hcl] Other (See Comments)    Serotonin toxicity  . Mirapex [Pramipexole]     Mirapex- Worsening "  body jerks"  . Pristiq [Desvenlafaxine] Other (See Comments)    Serotonin toxicity   . Prozac [Fluoxetine Hcl] Swelling  . Symbyax [Olanzapine-Fluoxetine Hcl] Swelling  . Wellbutrin [Bupropion Hcl] Swelling    ROS Review of Systems  Constitutional: Negative.   HENT: Negative.   Respiratory: Negative.   Endocrine: Negative for polyphagia and polyuria.  Genitourinary: Negative.   Skin: Negative for pallor and rash.  Neurological: Positive for numbness. Negative for weakness.      Objective:    Physical Exam Vitals and nursing note reviewed.  Constitutional:      General: She is not in acute distress.    Appearance: Normal appearance. She is not ill-appearing, toxic-appearing or diaphoretic.  Pulmonary:     Effort: Pulmonary effort is normal.  Neurological:     Mental Status: She is alert.  Psychiatric:        Mood and Affect: Mood normal.        Behavior: Behavior normal.     Ht 5' 5" (1.651 m)   Wt 250 lb (113.4 kg)   BMI 41.60 kg/m  Wt Readings from Last 3 Encounters:  07/07/20 250 lb (113.4 kg)  06/16/20 259 lb (117.5 kg)  05/12/20 268 lb (121.6 kg)     Health Maintenance Due  Topic Date Due  . FOOT EXAM  Never done  . OPHTHALMOLOGY EXAM  Never done  . HIV Screening  Never done  . INFLUENZA VACCINE  04/05/2020    There are no preventive care reminders to display for this patient.  Lab Results  Component Value Date   TSH 3.56 04/02/2020   Lab Results  Component Value Date   WBC 14.3 (H) 05/12/2020   HGB 12.2 05/12/2020   HCT 37.3 05/12/2020   MCV 83.8 05/12/2020   PLT 320.0 05/12/2020   Lab Results  Component Value Date   NA 133 (L) 05/12/2020   K 3.8 05/12/2020   CO2  29 05/12/2020   GLUCOSE 93 05/12/2020   BUN 12 05/12/2020   CREATININE 0.76 05/12/2020   BILITOT 0.3 03/31/2020   ALKPHOS 87 03/31/2020   AST 15 03/31/2020   ALT 27 03/31/2020   PROT 6.2 03/31/2020   ALBUMIN 3.7 03/31/2020   CALCIUM 9.4 05/12/2020   ANIONGAP 10 12/30/2019   GFR 81.43 05/12/2020   Lab Results  Component Value Date   CHOL 166 11/12/2018   Lab Results  Component Value Date   HDL 35.80 (L) 11/12/2018   Lab Results  Component Value Date   LDLCALC 174 (H) 04/15/2015   Lab Results  Component Value Date   TRIG 221.0 (H) 11/12/2018   Lab Results  Component Value Date   CHOLHDL 5 11/12/2018   Lab Results  Component Value Date   HGBA1C 7.6 (H) 04/02/2020      Assessment & Plan:   Problem List Items Addressed This Visit      Cardiovascular and Mediastinum   Essential hypertension   Relevant Medications   atorvastatin (LIPITOR) 20 MG tablet   Other Relevant Orders   CBC   Comprehensive metabolic panel     Endocrine   Adult hypothyroidism   Relevant Orders   TSH   Type 2 diabetes mellitus with hyperglycemia, without long-term current use of insulin (HCC) - Primary   Relevant Medications   atorvastatin (LIPITOR) 20 MG tablet   glucose monitoring kit (FREESTYLE) monitoring kit   Other Relevant Orders   Comprehensive metabolic panel   Hemoglobin A1c  Other   Elevated cholesterol   Relevant Medications   atorvastatin (LIPITOR) 20 MG tablet   Other Relevant Orders   Comprehensive metabolic panel   LDL cholesterol, direct      Meds ordered this encounter  Medications  . DISCONTD: atorvastatin (LIPITOR) 20 MG tablet    Sig: TAKE 1 TABLET(20 MG) BY MOUTH DAILY    Dispense:  100 tablet    Refill:  0  . atorvastatin (LIPITOR) 20 MG tablet    Sig: TAKE 1 TABLET(20 MG) BY MOUTH DAILY    Dispense:  100 tablet    Refill:  3  . glucose monitoring kit (FREESTYLE) monitoring kit    Sig: 1 each by Does not apply route as needed for other (check  glucose daily).    Dispense:  1 each    Refill:  1    Follow-up: Return in about 3 months (around 10/07/2020).   Follow-up in 3 months for recheck.  She is at the clinic today will have her labs drawn.  We will check her feet at that time.  Encouraged her to continue on her effort to lose weight.  Virtual Visit via Telephone Note  I connected with Graceanna L Stfleur on 07/07/20 at  2:00 PM EDT by telephone and verified that I am speaking with the correct person using two identifiers.  Location: Patient: alone in her car outside of our building.  Provider: home   I discussed the limitations, risks, security and privacy concerns of performing an evaluation and management service by telephone and the availability of in person appointments. I also discussed with the patient that there may be a patient responsible charge related to this service. The patient expressed understanding and agreed to proceed.   History of Present Illness:    Observations/Objective:   Assessment and Plan:   Follow Up Instructions:    I discussed the assessment and treatment plan with the patient. The patient was provided an opportunity to ask questions and all were answered. The patient agreed with the plan and demonstrated an understanding of the instructions.   The patient was advised to call back or seek an in-person evaluation if the symptoms worsen or if the condition fails to improve as anticipated.  I provided 20 minutes of non-face-to-face time during this encounter.   Libby Maw, MD  Libby Maw, MD   Interactive video and audio telecommunications were attempted between myself and the patient. However they failed due to the patient having technical difficulties or not having access to video capability. We continued and completed with audio only.

## 2020-07-09 DIAGNOSIS — C44529 Squamous cell carcinoma of skin of other part of trunk: Secondary | ICD-10-CM | POA: Diagnosis not present

## 2020-07-10 ENCOUNTER — Ambulatory Visit: Payer: PPO | Admitting: Family Medicine

## 2020-07-14 ENCOUNTER — Other Ambulatory Visit: Payer: PPO

## 2020-07-14 ENCOUNTER — Encounter: Payer: Self-pay | Admitting: Family Medicine

## 2020-07-14 DIAGNOSIS — M25552 Pain in left hip: Secondary | ICD-10-CM | POA: Diagnosis not present

## 2020-07-14 DIAGNOSIS — Z79899 Other long term (current) drug therapy: Secondary | ICD-10-CM | POA: Diagnosis not present

## 2020-07-14 DIAGNOSIS — E119 Type 2 diabetes mellitus without complications: Secondary | ICD-10-CM | POA: Diagnosis not present

## 2020-07-14 DIAGNOSIS — Z6841 Body Mass Index (BMI) 40.0 and over, adult: Secondary | ICD-10-CM | POA: Diagnosis not present

## 2020-07-14 DIAGNOSIS — I10 Essential (primary) hypertension: Secondary | ICD-10-CM | POA: Diagnosis not present

## 2020-07-15 ENCOUNTER — Telehealth: Payer: Self-pay | Admitting: Family Medicine

## 2020-07-15 NOTE — Telephone Encounter (Signed)
Patient states that the pharmacy did not receiver her prescription for her meter. Please call her back at [phone] once this has been sent in.

## 2020-07-16 ENCOUNTER — Other Ambulatory Visit: Payer: Self-pay

## 2020-07-16 DIAGNOSIS — E1165 Type 2 diabetes mellitus with hyperglycemia: Secondary | ICD-10-CM

## 2020-07-16 MED ORDER — FREESTYLE SYSTEM KIT: 1.0000 | PACK | 1 refills | Status: DC | PRN

## 2020-07-16 NOTE — Telephone Encounter (Signed)
Called pharmacy they never received the Rx that was sent in the 07/07/20 resent patient aware and will pick up.

## 2020-07-20 NOTE — Telephone Encounter (Signed)
Pt went to pharmacy and they still do not have the order for glucometer/supplies. Electronic transmission shows as failed in epic. They suggested faxing regular fax. Please call pt when you have a fax confirmation or she can come pick up hard copy if needed.  Ph # [phone]

## 2020-07-20 NOTE — Telephone Encounter (Signed)
Called Rx into pharmacy patient aware and will pick up

## 2020-07-21 ENCOUNTER — Telehealth: Payer: Self-pay

## 2020-07-21 DIAGNOSIS — E1165 Type 2 diabetes mellitus with hyperglycemia: Secondary | ICD-10-CM

## 2020-07-21 MED ORDER — GLUCOSE BLOOD VI STRP: ORAL_STRIP | 12 refills | Status: DC

## 2020-07-21 MED ORDER — FREESTYLE SYSTEM KIT: 1.0000 | PACK | 1 refills | Status: DC | PRN

## 2020-07-21 MED ORDER — FREESTYLE LANCETS MISC: 12 refills | Status: DC

## 2020-07-21 NOTE — Telephone Encounter (Signed)
Pt calling to request lancets and test strips sent to [facility] today. Pt stated that [facility] will not release her the Meter without test strips and lancet rx.

## 2020-07-22 DIAGNOSIS — N3946 Mixed incontinence: Secondary | ICD-10-CM | POA: Diagnosis not present

## 2020-07-22 DIAGNOSIS — R351 Nocturia: Secondary | ICD-10-CM | POA: Diagnosis not present

## 2020-07-22 DIAGNOSIS — R350 Frequency of micturition: Secondary | ICD-10-CM | POA: Diagnosis not present

## 2020-07-27 ENCOUNTER — Ambulatory Visit
Admission: RE | Admit: 2020-07-27 | Discharge: 2020-07-27 | Disposition: A | Discharge status: Home / Self Care | Payer: PPO | Source: Ambulatory Visit | Attending: Gastroenterology | Admitting: Gastroenterology

## 2020-07-27 DIAGNOSIS — B1920 Unspecified viral hepatitis C without hepatic coma: Secondary | ICD-10-CM

## 2020-07-27 DIAGNOSIS — K7402 Hepatic fibrosis, advanced fibrosis: Secondary | ICD-10-CM

## 2020-07-28 DIAGNOSIS — Z6841 Body Mass Index (BMI) 40.0 and over, adult: Secondary | ICD-10-CM | POA: Diagnosis not present

## 2020-08-03 ENCOUNTER — Telehealth: Payer: Self-pay | Admitting: Family Medicine

## 2020-08-03 DIAGNOSIS — E039 Hypothyroidism, unspecified: Secondary | ICD-10-CM

## 2020-08-03 MED ORDER — LEVOTHYROXINE SODIUM 75 MCG PO TABS: 75.0000 ug | ORAL_TABLET | Freq: Every day | ORAL | 0 refills | Status: DC

## 2020-08-03 NOTE — Telephone Encounter (Signed)
Pt aware to follow up with PCP in 10/2020.   Rx sent 90 days supply.

## 2020-08-03 NOTE — Telephone Encounter (Signed)
°  Who's calling (name and relationship to patient) : Ophelia   Best contact [phone]  Provider they see: Dr Ethelene Hal   Reason for call: Patient called -need refill of medication. Please advise.   PRESCRIPTION REFILL ONLY  Name of prescription: Levothyroxine (Synthroid) 75 mcg  Pharmacy: [facility] on [address]

## 2020-08-05 DIAGNOSIS — F3132 Bipolar disorder, current episode depressed, moderate: Secondary | ICD-10-CM | POA: Diagnosis not present

## 2020-08-05 DIAGNOSIS — F429 Obsessive-compulsive disorder, unspecified: Secondary | ICD-10-CM | POA: Diagnosis not present

## 2020-08-05 DIAGNOSIS — F411 Generalized anxiety disorder: Secondary | ICD-10-CM | POA: Diagnosis not present

## 2020-08-11 ENCOUNTER — Ambulatory Visit (INDEPENDENT_AMBULATORY_CARE_PROVIDER_SITE_OTHER): Payer: PPO

## 2020-08-11 VITALS — Ht 64.0 in | Wt 239.0 lb

## 2020-08-11 DIAGNOSIS — E1165 Type 2 diabetes mellitus with hyperglycemia: Secondary | ICD-10-CM

## 2020-08-11 DIAGNOSIS — Z0000 Encounter for general adult medical examination without abnormal findings: Secondary | ICD-10-CM

## 2020-08-11 DIAGNOSIS — E119 Type 2 diabetes mellitus without complications: Secondary | ICD-10-CM | POA: Diagnosis not present

## 2020-08-11 DIAGNOSIS — M25552 Pain in left hip: Secondary | ICD-10-CM | POA: Diagnosis not present

## 2020-08-11 DIAGNOSIS — Z79899 Other long term (current) drug therapy: Secondary | ICD-10-CM | POA: Diagnosis not present

## 2020-08-11 DIAGNOSIS — I10 Essential (primary) hypertension: Secondary | ICD-10-CM | POA: Diagnosis not present

## 2020-08-11 NOTE — Progress Notes (Signed)
Subjective:   [name] is a 49 y.o. female who presents for Medicare Annual (Subsequent) preventive examination.  I connected with [name] today by telephone and verified that I am speaking with the correct person using two identifiers. Location patient: home Location provider: work Persons participating in the virtual visit: patient, Marine scientist.    I discussed the limitations, risks, security and privacy concerns of performing an evaluation and management service by telephone and the availability of in person appointments. I also discussed with the patient that there may be a patient responsible charge related to this service. The patient expressed understanding and verbally consented to this telephonic visit.    Interactive audio and video telecommunications were attempted between this provider and patient, however failed, due to patient having technical difficulties OR patient did not have access to video capability.  We continued and completed visit with audio only.  Some vital signs may be absent or patient reported.   Time Spent with patient on telephone encounter: 30 minutes   Review of Systems     Cardiac Risk Factors include: diabetes mellitus;hypertension;obesity (BMI >30kg/m2);smoking/ tobacco exposure     Objective:    Today's Vitals   08/11/20 1416  Weight: 239 lb (108.4 kg)  Height: 5' 4"  (1.626 m)  PainSc: 7    Body mass index is 41.02 kg/m.  Advanced Directives 08/11/2020 12/30/2019 12/23/2019 08/07/2019 02/08/2019 11/25/2018 08/13/2018  Does Patient Have a Medical Advance Directive? No No No No No No No  Would patient like information on creating a medical advance directive? Yes (MAU/Ambulatory/Procedural Areas - Information given) No - Patient declined No - Patient declined No - Patient declined - - -  Some encounter information is confidential and restricted. Go to Review Flowsheets activity to see all data.    Current Medications (verified) Outpatient Encounter  Medications as of 08/11/2020  Medication Sig  . ALPRAZolam (XANAX) 0.5 MG tablet Take 0.5 mg by mouth 2 (two) times daily as needed.  . ARIPiprazole (ABILIFY) 5 MG tablet Take 1 tablet (5 mg total) by mouth daily.  Marland Kitchen atorvastatin (LIPITOR) 20 MG tablet TAKE 1 TABLET(20 MG) BY MOUTH DAILY  . blood glucose meter kit and supplies KIT Dispense based on patient and insurance preference. Check glucose before breakfast. ICD: E11.65  . clomiPRAMINE (ANAFRANIL) 75 MG capsule Take 75 mg by mouth at bedtime.  Marland Kitchen FINACEA 15 % FOAM Apply 1 application topically 2 (two) times daily.   Marland Kitchen gabapentin (NEURONTIN) 300 MG capsule TAKE 1 CAPSULE(300 MG) BY MOUTH THREE TIMES DAILY  . glucose blood test strip Use as instructed, check glucose before breakfast. ICD: E11.65  . HYDROcodone-acetaminophen (NORCO) 10-325 MG tablet Take 1 tablet by mouth 4 (four) times daily as needed.   . lamoTRIgine (LAMICTAL) 150 MG tablet Take 150 mg by mouth 2 (two) times daily.  . Lancets (FREESTYLE) lancets Use as instructed. E11.65  . levothyroxine (SYNTHROID) 75 MCG tablet Take 1 tablet (75 mcg total) by mouth daily before breakfast.  . lisinopril (ZESTRIL) 10 MG tablet TAKE 1 TABLET(10 MG) BY MOUTH DAILY  . metFORMIN (GLUCOPHAGE) 500 MG tablet Take 1 tablet (500 mg total) by mouth 2 (two) times daily with a meal.  . methylphenidate (RITALIN) 10 MG tablet Take 1 tablet by mouth 2 (two) times daily.  . Semaglutide,0.25 or 0.5MG/DOS, 2 MG/1.5ML SOPN Inject into the skin.  Marland Kitchen metroNIDAZOLE (FLAGYL) 500 MG tablet Take 1 tablet (500 mg total) by mouth 2 (two) times daily. (Patient not taking: Reported  on 07/07/2020)  . oxyCODONE-acetaminophen (PERCOCET) 7.5-325 MG tablet Take 1 tablet by mouth every 6 (six) hours as needed. (Patient not taking: Reported on 07/07/2020)  . phentermine 15 MG capsule Take by mouth.   No facility-administered encounter medications on file as of 08/11/2020.    Allergies (verified) Pregabalin, Celebrex  [celecoxib], Effexor [venlafaxine hcl], Mirapex [pramipexole], Pristiq [desvenlafaxine], Prozac [fluoxetine hcl], Symbyax [olanzapine-fluoxetine hcl], and Wellbutrin [bupropion hcl]   History: Past Medical History:  Diagnosis Date  . ADHD (attention deficit hyperactivity disorder)   . Anemia    with pregnancy  . Anxiety   . Arthritis   . Bipolar disorder ([place])   . Chronic back pain greater than 3 months duration   . Depression   . Dysuria    has to have self in and out cath   . Eroded bladder suspension mesh ([place])   . Hepatitis    C  . Hypothyroidism   . Leg pain, bilateral   . Obsessive-compulsive disorder   . Rosacea   . Sciatic nerve pain   . Social anxiety disorder   . Thyroid disease    Past Surgical History:  Procedure Laterality Date  . BACK SURGERY     total of 6 back surgeries  . bladder mesh  2009  . DILITATION & CURRETTAGE/HYSTROSCOPY WITH NOVASURE ABLATION N/A 08/19/2016   Procedure: DILATATION & CURETTAGE WITH NOVASURE ABLATION;  Surgeon: [name], MD;  Location: [facility];  Service: Gynecology;  Laterality: N/A;  . EUS N/A 09/09/2015   Procedure: UPPER ENDOSCOPIC ULTRASOUND (EUS) RADIAL;  Surgeon: [name], MD;  Location: [facility];  Service: Endoscopy;  Laterality: N/A;  . EYE SURGERY    . INCONTINENCE SURGERY    . SPINAL FUSION  2008   times two  . TUBAL LIGATION  2006   Family History  Problem Relation Age of Onset  . Schizophrenia Father   . Schizophrenia Cousin   . Hyperthyroidism Mother   . Hypothyroidism Maternal Grandmother    Social History   Socioeconomic History  . Marital status: Legally Separated    Spouse name: Not on file  . Number of children: Not on file  . Years of education: Not on file  . Highest education level: Not on file  Occupational History  . Not on file  Tobacco Use  . Smoking status: Light Tobacco Smoker    Packs/day: 1.00    Years: 10.00    Pack years: 10.00    Types: Cigarettes    Last attempt to  quit: 02/09/2016    Years since quitting: 4.5  . Smokeless tobacco: Never Used  . Tobacco comment: use vapor  Vaping Use  . Vaping Use: Never used  Substance and Sexual Activity  . Alcohol use: No  . Drug use: No  . Sexual activity: Yes  Other Topics Concern  . Not on file  Social History Narrative  . Not on file   Social Determinants of Health   Financial Resource Strain: Medium Risk  . Difficulty of Paying Living Expenses: Somewhat hard  Food Insecurity: Food Insecurity Present  . Worried About Charity fundraiser in the Last Year: Sometimes true  . Ran Out of Food in the Last Year: Never true  Transportation Needs: Unmet Transportation Needs  . Lack of Transportation (Medical): Yes  . Lack of Transportation (Non-Medical): No  Physical Activity: Insufficiently Active  . Days of Exercise per Week: 3 days  . Minutes of Exercise per Session: 20 min  Stress:  No Stress Concern Present  . Feeling of Stress : Only a little  Social Connections: Socially Isolated  . Frequency of Communication with Friends and Family: More than three times a week  . Frequency of Social Gatherings with Friends and Family: More than three times a week  . Attends Religious Services: Never  . Active Member of Clubs or Organizations: No  . Attends Archivist Meetings: Never  . Marital Status: Separated    Tobacco Counseling Ready to quit: Not Answered Counseling given: Not Answered Comment: use vapor   Clinical Intake:  Pre-visit preparation completed: Yes  Pain : 0-10 Pain Score: 7  Pain Type: Chronic pain Pain Location: Hip Pain Orientation: Left Pain Onset: More than a month ago Pain Frequency: Constant     Nutritional Status: BMI > 30  Obese Nutritional Risks: None Diabetes: Yes CBG done?: No Did pt. bring in CBG monitor from home?: No  How often do you need to have someone help you when you read instructions, pamphlets, or other written materials from your doctor or  pharmacy?: 1 - Never What is the last grade level you completed in school?: 2 yrs of college  Diabetes:  Is the patient diabetic?  Yes  If diabetic, was a CBG obtained today?  No  Did the patient bring in their glucometer from home?  No phone visit How often do you monitor your CBG's? daily.   Financial Strains and Diabetes Management:  Are you having any financial strains with the device, your supplies or your medication? Yes .  Does the patient want to be seen by Chronic Care Management for management of their diabetes?  Yes  Would the patient like to be referred to a Nutritionist or for Diabetic Management?  No   Diabetic Exams:  Diabetic Eye Exam: Completed 06/29/2020.   Diabetic Foot Exam: Pt has been advised about the importance in completing this exam. To be completed by PCP.    Interpreter Needed?: No  Information entered by :: Caroleen Hamman LPN   Activities of Daily Living In your present state of health, do you have any difficulty performing the following activities: 08/11/2020  Hearing? N  Vision? N  Difficulty concentrating or making decisions? N  Walking or climbing stairs? N  Dressing or bathing? N  Doing errands, shopping? N  Preparing Food and eating ? N  Using the Toilet? N  In the past six months, have you accidently leaked urine? Y  Comment wears adult diapers  Do you have problems with loss of bowel control? N  Managing your Medications? N  Managing your Finances? N  Housekeeping or managing your Housekeeping? N  Some recent data might be hidden    Patient Care Team: Libby Maw, MD as PCP - General (Family Medicine)  Indicate any recent Medical Services you may have received from other than [facility] providers in the past year (date may be approximate).     Assessment:   This is a routine wellness examination for Kyandra.  Hearing/Vision screen  Hearing Screening   125Hz  250Hz  500Hz  1000Hz  2000Hz  3000Hz  4000Hz  6000Hz  8000Hz   Right  ear:           Left ear:           Comments: No issues  Vision Screening Comments: Reading glasses Last eye exam-06/29/20-Dr Delman Cheadle  Dietary issues and exercise activities discussed: Current Exercise Habits: Home exercise routine, Type of exercise: strength training/weights, Time (Minutes): 15, Frequency (Times/Week): 3, Weekly Exercise (Minutes/Week):  45, Intensity: Mild, Exercise limited by: orthopedic condition(s)  Goals    . Increase physical activity    . Patient Stated     Continue weight loss & quit smoking      Depression Screen PHQ 2/9 Scores 08/11/2020 07/07/2020 04/02/2020 08/07/2019  PHQ - 2 Score 1 2 1  0  PHQ- 9 Score - - 8 -  Some encounter information is confidential and restricted. Go to Review Flowsheets activity to see all data.    Fall Risk Fall Risk  08/11/2020 07/07/2020 04/02/2020 08/07/2019  Falls in the past year? 0 0 0 0  Number falls in past yr: 0 - 0 -  Injury with Fall? 0 - 0 -  Follow up Falls prevention discussed - Falls evaluation completed Education provided;Falls prevention discussed    FALL RISK PREVENTION PERTAINING TO THE HOME:  Any stairs in or around the home? Yes  If so, are there any without handrails? No  Home free of loose throw rugs in walkways, pet beds, electrical cords, etc? Yes  Adequate lighting in your home to reduce risk of falls? Yes   ASSISTIVE DEVICES UTILIZED TO PREVENT FALLS:  Life alert? No  Use of a cane, walker or w/c? Yes  Grab bars in the bathroom? No  Shower chair or bench in shower? No  Elevated toilet seat or a handicapped toilet? No   TIMED UP AND GO:  Was the test performed? No . Phone visit   Cognitive Function:Normal cognitive status assessed by this Nurse Health Advisor. No abnormalities found.          Immunizations Immunization History  Administered Date(s) Administered  . Hepatitis A, Ped/Adol-2 Dose 06/17/2016, 12/22/2016  . Hepatitis B, adult 06/17/2016, 08/02/2016, 12/22/2016  .  Influenza-Unspecified 06/07/2016, 06/04/2020  . PFIZER SARS-COV-2 Vaccination 12/26/2019, 01/18/2020, 07/03/2020  . Pneumococcal Polysaccharide-23 10/21/2016  . Tdap 10/15/2008, 02/08/2019    TDAP status: Up to date  Flu Vaccine status: Up to date  Pneumococcal vaccine status: Up to date  Covid-19 vaccine status: Completed vaccines  Qualifies for Shingles Vaccine? No  -Not yet indicated   Screening Tests Health Maintenance  Topic Date Due  . FOOT EXAM  Never done  . HIV Screening  Never done  . HEMOGLOBIN A1C  01/04/2021  . OPHTHALMOLOGY EXAM  06/29/2021  . PAP SMEAR-Modifier  04/16/2023  . TETANUS/TDAP  02/07/2029  . INFLUENZA VACCINE  Completed  . PNEUMOCOCCAL POLYSACCHARIDE VACCINE AGE 25-64 HIGH RISK  Completed  . COVID-19 Vaccine  Completed  . Hepatitis C Screening  Completed    Health Maintenance  Health Maintenance Due  Topic Date Due  . FOOT EXAM  Never done  . HIV Screening  Never done    Colorectal Cancer screening: Not yet indicated  Mammogram status: Completed Bilateral 01/27/2020. Repeat every year  Bone Density status: Not yet indicated.  Lung Cancer Screening: (Low Dose CT Chest recommended if Age 50-80 years, 30 pack-year currently smoking OR have quit w/in 15years.) does not qualify.     Additional Screening:  Hepatitis C Screening:  Completed 07/27/2020  Vision Screening: Recommended annual ophthalmology exams for early detection of glaucoma and other disorders of the eye. Is the patient up to date with their annual eye exam?  Yes  Who is the provider or what is the name of the office in which the patient attends annual eye exams? Dr. Delman Cheadle   Dental Screening: Recommended annual dental exams for proper oral hygiene  Community Resource Referral / Chronic Care Management:  CRR required this visit?  Yes For food & transportation  CCM required this visit?  Yes For medication assistance     Plan:     I have personally reviewed and noted  the following in the patient's chart:   . Medical and social history . Use of alcohol, tobacco or illicit drugs  . Current medications and supplements . Functional ability and status . Nutritional status . Physical activity . Advanced directives . List of other physicians . Hospitalizations, surgeries, and ER visits in previous 12 months . Vitals . Screenings to include cognitive, depression, and falls . Referrals and appointments  In addition, I have reviewed and discussed with patient certain preventive protocols, quality metrics, and best practice recommendations. A written personalized care plan for preventive services as well as general preventive health recommendations were provided to patient.   Due to this being a telephonic visit, the after visit summary with patients personalized plan was offered to patient via mail or my-chart. Patient would like to access on my-chart.   Marta Antu, LPN   [id number]  Nurse Health Advisor  Nurse Notes: None

## 2020-08-11 NOTE — Patient Instructions (Signed)
[name] , Thank you for taking time to complete your Medicare Wellness Visit. I appreciate your ongoing commitment to your health goals. Please review the following plan we discussed and let me know if I can assist you in the future.   Screening recommendations/referrals: Colonoscopy: Not yet indicated Mammogram: Completed 01/27/20-Due 01/26/21 Bone Density: Not yet indicated. Recommended yearly ophthalmology/optometry visit for glaucoma screening and checkup Recommended yearly dental visit for hygiene and checkup  Vaccinations: Influenza vaccine: Up to date Pneumococcal vaccine: Up to date Tdap vaccine: Up to date-Due 02/07/2029 Shingles vaccine: Not yet indicated Covid-19: Completed vaccines  Advanced directives: Information mailed today  Conditions/risks identified: See problem list  Next appointment: Follow up in one year for your annual wellness visit.   Preventive Care 40-64 Years, Female Preventive care refers to lifestyle choices and visits with your health care provider that can promote health and wellness. What does preventive care include?  A yearly physical exam. This is also called an annual well check.  Dental exams once or twice a year.  Routine eye exams. Ask your health care provider how often you should have your eyes checked.  Personal lifestyle choices, including:  Daily care of your teeth and gums.  Regular physical activity.  Eating a healthy diet.  Avoiding tobacco and drug use.  Limiting alcohol use.  Practicing safe sex.  Taking low-dose aspirin daily starting at age 29.  Taking vitamin and mineral supplements as recommended by your health care provider. What happens during an annual well check? The services and screenings done by your health care provider during your annual well check will depend on your age, overall health, lifestyle risk factors, and family history of disease. Counseling  Your health care provider may ask you questions  about your:  Alcohol use.  Tobacco use.  Drug use.  Emotional well-being.  Home and relationship well-being.  Sexual activity.  Eating habits.  Work and work Statistician.  Method of birth control.  Menstrual cycle.  Pregnancy history. Screening  You may have the following tests or measurements:  Height, weight, and BMI.  Blood pressure.  Lipid and cholesterol levels. These may be checked every 5 years, or more frequently if you are over 76 years old.  Skin check.  Lung cancer screening. You may have this screening every year starting at age 6 if you have a 30-pack-year history of smoking and currently smoke or have quit within the past 15 years.  Fecal occult blood test (FOBT) of the stool. You may have this test every year starting at age 26.  Flexible sigmoidoscopy or colonoscopy. You may have a sigmoidoscopy every 5 years or a colonoscopy every 10 years starting at age 74.  Hepatitis C blood test.  Hepatitis B blood test.  Sexually transmitted disease (STD) testing.  Diabetes screening. This is done by checking your blood sugar (glucose) after you have not eaten for a while (fasting). You may have this done every 1-3 years.  Mammogram. This may be done every 1-2 years. Talk to your health care provider about when you should start having regular mammograms. This may depend on whether you have a family history of breast cancer.  BRCA-related cancer screening. This may be done if you have a family history of breast, ovarian, tubal, or peritoneal cancers.  Pelvic exam and Pap test. This may be done every 3 years starting at age 43. Starting at age 35, this may be done every 5 years if you have a Pap test in  combination with an HPV test.  Bone density scan. This is done to screen for osteoporosis. You may have this scan if you are at high risk for osteoporosis. Discuss your test results, treatment options, and if necessary, the need for more tests with your  health care provider. Vaccines  Your health care provider may recommend certain vaccines, such as:  Influenza vaccine. This is recommended every year.  Tetanus, diphtheria, and acellular pertussis (Tdap, Td) vaccine. You may need a Td booster every 10 years.  Zoster vaccine. You may need this after age 75.  Pneumococcal 13-valent conjugate (PCV13) vaccine. You may need this if you have certain conditions and were not previously vaccinated.  Pneumococcal polysaccharide (PPSV23) vaccine. You may need one or two doses if you smoke cigarettes or if you have certain conditions. Talk to your health care provider about which screenings and vaccines you need and how often you need them. This information is not intended to replace advice given to you by your health care provider. Make sure you discuss any questions you have with your health care provider. Document Released: 09/18/2015 Document Revised: 05/11/2016 Document Reviewed: 06/23/2015 Elsevier Interactive Patient Education  2017 [place] Prevention in the Home Falls can cause injuries. They can happen to people of all ages. There are many things you can do to make your home safe and to help prevent falls. What can I do on the outside of my home?  Regularly fix the edges of walkways and driveways and fix any cracks.  Remove anything that might make you trip as you walk through a door, such as a raised step or threshold.  Trim any bushes or trees on the path to your home.  Use bright outdoor lighting.  Clear any walking paths of anything that might make someone trip, such as rocks or tools.  Regularly check to see if handrails are loose or broken. Make sure that both sides of any steps have handrails.  Any raised decks and porches should have guardrails on the edges.  Have any leaves, snow, or ice cleared regularly.  Use sand or salt on walking paths during winter.  Clean up any spills in your garage right away.  This includes oil or grease spills. What can I do in the bathroom?  Use night lights.  Install grab bars by the toilet and in the tub and shower. Do not use towel bars as grab bars.  Use non-skid mats or decals in the tub or shower.  If you need to sit down in the shower, use a plastic, non-slip stool.  Keep the floor dry. Clean up any water that spills on the floor as soon as it happens.  Remove soap buildup in the tub or shower regularly.  Attach bath mats securely with double-sided non-slip rug tape.  Do not have throw rugs and other things on the floor that can make you trip. What can I do in the bedroom?  Use night lights.  Make sure that you have a light by your bed that is easy to reach.  Do not use any sheets or blankets that are too big for your bed. They should not hang down onto the floor.  Have a firm chair that has side arms. You can use this for support while you get dressed.  Do not have throw rugs and other things on the floor that can make you trip. What can I do in the kitchen?  Clean up any spills  right away.  Avoid walking on wet floors.  Keep items that you use a lot in easy-to-reach places.  If you need to reach something above you, use a strong step stool that has a grab bar.  Keep electrical cords out of the way.  Do not use floor polish or wax that makes floors slippery. If you must use wax, use non-skid floor wax.  Do not have throw rugs and other things on the floor that can make you trip. What can I do with my stairs?  Do not leave any items on the stairs.  Make sure that there are handrails on both sides of the stairs and use them. Fix handrails that are broken or loose. Make sure that handrails are as long as the stairways.  Check any carpeting to make sure that it is firmly attached to the stairs. Fix any carpet that is loose or worn.  Avoid having throw rugs at the top or bottom of the stairs. If you do have throw rugs, attach them  to the floor with carpet tape.  Make sure that you have a light switch at the top of the stairs and the bottom of the stairs. If you do not have them, ask someone to add them for you. What else can I do to help prevent falls?  Wear shoes that:  Do not have high heels.  Have rubber bottoms.  Are comfortable and fit you well.  Are closed at the toe. Do not wear sandals.  If you use a stepladder:  Make sure that it is fully opened. Do not climb a closed stepladder.  Make sure that both sides of the stepladder are locked into place.  Ask someone to hold it for you, if possible.  Clearly mark and make sure that you can see:  Any grab bars or handrails.  First and last steps.  Where the edge of each step is.  Use tools that help you move around (mobility aids) if they are needed. These include:  Canes.  Walkers.  Scooters.  Crutches.  Turn on the lights when you go into a dark area. Replace any light bulbs as soon as they burn out.  Set up your furniture so you have a clear path. Avoid moving your furniture around.  If any of your floors are uneven, fix them.  If there are any pets around you, be aware of where they are.  Review your medicines with your doctor. Some medicines can make you feel dizzy. This can increase your chance of falling. Ask your doctor what other things that you can do to help prevent falls. This information is not intended to replace advice given to you by your health care provider. Make sure you discuss any questions you have with your health care provider. Document Released: 06/18/2009 Document Revised: 01/28/2016 Document Reviewed: 09/26/2014 Elsevier Interactive Patient Education  2017 [employer].

## 2020-08-19 ENCOUNTER — Encounter: Payer: Self-pay | Admitting: Family Medicine

## 2020-08-19 ENCOUNTER — Other Ambulatory Visit: Payer: Self-pay

## 2020-08-20 ENCOUNTER — Encounter: Payer: Self-pay | Admitting: Family

## 2020-08-20 ENCOUNTER — Ambulatory Visit (INDEPENDENT_AMBULATORY_CARE_PROVIDER_SITE_OTHER): Payer: PPO | Admitting: Family

## 2020-08-20 VITALS — BP 118/64 | HR 78 | Temp 97.1°F | Wt 241.0 lb

## 2020-08-20 DIAGNOSIS — R200 Anesthesia of skin: Secondary | ICD-10-CM | POA: Diagnosis not present

## 2020-08-20 DIAGNOSIS — E1165 Type 2 diabetes mellitus with hyperglycemia: Secondary | ICD-10-CM | POA: Diagnosis not present

## 2020-08-20 DIAGNOSIS — M5430 Sciatica, unspecified side: Secondary | ICD-10-CM

## 2020-08-20 NOTE — Patient Instructions (Signed)
Peripheral Neuropathy Peripheral neuropathy is a type of nerve damage. It affects nerves that carry signals between the spinal cord and the arms, legs, and the rest of the body (peripheral nerves). It does not affect nerves in the spinal cord or brain. In peripheral neuropathy, one nerve or a group of nerves may be damaged. Peripheral neuropathy is a broad category that includes many specific nerve disorders, like diabetic neuropathy, hereditary neuropathy, and carpal tunnel syndrome. What are the causes? This condition may be caused by:  Diabetes. This is the most common cause of peripheral neuropathy.  Nerve injury.  Pressure or stress on a nerve that lasts a long time.  Lack (deficiency) of B vitamins. This can result from alcoholism, poor diet, or a restricted diet.  Infections.  Autoimmune diseases, such as rheumatoid arthritis and systemic lupus erythematosus.  Nerve diseases that are passed from parent to child (inherited).  Some medicines, such as cancer medicines (chemotherapy).  Poisonous (toxic) substances, such as lead and mercury.  Too little blood flowing to the legs.  Kidney disease.  Thyroid disease. In some cases, the cause of this condition is not known. What are the signs or symptoms? Symptoms of this condition depend on which of your nerves is damaged. Common symptoms include:  Loss of feeling (numbness) in the feet, hands, or both.  Tingling in the feet, hands, or both.  Burning pain.  Very sensitive skin.  Weakness.  Not being able to move a part of the body (paralysis).  Muscle twitching.  Clumsiness or poor coordination.  Loss of balance.  Not being able to control your bladder.  Feeling dizzy.  Sexual problems. How is this diagnosed? Diagnosing and finding the cause of peripheral neuropathy can be difficult. Your health care provider will take your medical history and do a physical exam. A neurological exam will also be done. This  involves checking things that are affected by your brain, spinal cord, and nerves (nervous system). For example, your health care provider will check your reflexes, how you move, and what you can feel. You may have other tests, such as:  Blood tests.  Electromyogram (EMG) and nerve conduction tests. These tests check nerve function and how well the nerves are controlling the muscles.  Imaging tests, such as CT scans or MRI to rule out other causes of your symptoms.  Removing a small piece of nerve to be examined in a lab (nerve biopsy). This is rare.  Removing and examining a small amount of the fluid that surrounds the brain and spinal cord (lumbar puncture). This is rare. How is this treated? Treatment for this condition may involve:  Treating the underlying cause of the neuropathy, such as diabetes, kidney disease, or vitamin deficiencies.  Stopping medicines that can cause neuropathy, such as chemotherapy.  Medicine to relieve pain. Medicines may include: ? Prescription or over-the-counter pain medicine. ? Antiseizure medicine. ? Antidepressants. ? Pain-relieving patches that are applied to painful areas of skin.  Surgery to relieve pressure on a nerve or to destroy a nerve that is causing pain.  Physical therapy to help improve movement and balance.  Devices to help you move around (assistive devices). Follow these instructions at home: Medicines  Take over-the-counter and prescription medicines only as told by your health care provider. Do not take any other medicines without first asking your health care provider.  Do not drive or use heavy machinery while taking prescription pain medicine. Lifestyle   Do not use any products that contain nicotine   or tobacco, such as cigarettes and e-cigarettes. Smoking keeps blood from reaching damaged nerves. If you need help quitting, ask your health care provider.  Avoid or limit alcohol. Too much alcohol can cause a vitamin B  deficiency, and vitamin B is needed for healthy nerves.  Eat a healthy diet. This includes: ? Eating foods that are high in fiber, such as fresh fruits and vegetables, whole grains, and beans. ? Limiting foods that are high in fat and processed sugars, such as fried or sweet foods. General instructions   If you have diabetes, work closely with your health care provider to keep your blood sugar under control.  If you have numbness in your feet: ? Check every day for signs of injury or infection. Watch for redness, warmth, and swelling. ? Wear padded socks and comfortable shoes. These help protect your feet.  Develop a good support system. Living with peripheral neuropathy can be stressful. Consider talking with a mental health specialist or joining a support group.  Use assistive devices and attend physical therapy as told by your health care provider. This may include using a walker or a cane.  Keep all follow-up visits as told by your health care provider. This is important. Contact a health care provider if:  You have new signs or symptoms of peripheral neuropathy.  You are struggling emotionally from dealing with peripheral neuropathy.  Your pain is not well-controlled. Get help right away if:  You have an injury or infection that is not healing normally.  You develop new weakness in an arm or leg.  You fall frequently. Summary  Peripheral neuropathy is when the nerves in the arms, or legs are damaged, resulting in numbness, weakness, or pain.  There are many causes of peripheral neuropathy, including diabetes, pinched nerves, vitamin deficiencies, autoimmune disease, and hereditary conditions.  Diagnosing and finding the cause of peripheral neuropathy can be difficult. Your health care provider will take your medical history, do a physical exam, and do tests, including blood tests and nerve function tests.  Treatment involves treating the underlying cause of the  neuropathy and taking medicines to help control pain. Physical therapy and assistive devices may also help. This information is not intended to replace advice given to you by your health care provider. Make sure you discuss any questions you have with your health care provider. Document Revised: 08/04/2017 Document Reviewed: 10/31/2016 Elsevier Patient Education  [place] Nerve A pinched nerve is an injury that occurs when too much pressure is placed on a nerve. This pressure can cause pain, burning, and muscle weakness in places that the nerve supplies feeling to, such as an arm, hand, or leg, or the back or neck. If a nerve is severely pinched or has been pinched for a long time, permanent nerve damage can occur. What are the causes? This condition may be caused by:  A nerve passing through a narrow area between bones or other body structures.  Arthritis that causes bones to press on a nerve.  Loss of blood supply to a nerve.  A nerve being stretched from an injury.  A sudden injury with swelling.  Long-term wear on the nerve.  Age-related changes in the spine. What are the signs or symptoms? The most common symptoms of a pinched nerve are feeling a tingling sensation and numbness. Other symptoms include:  Pain that spreads from one area of the body part to another.  A burning feeling.  Muscle weakness. How  is this diagnosed? This condition may be diagnosed based on:  A physical exam. During the exam, your health care provider will: ? Check for numbness and muscle weakness. ? Move affected body parts to test for pain.  X-rays to check for bone damage.  A MRI or CT scan to check for conditions that may be causing nerve damage.  A muscle test (electromyogram, EMG) to evaluate how your muscles and nerves communicate. How is this treated?     A pinched nerve is usually treated first by:  Resting the affected body area.  Using devices to help  you move without pain (supportive or protective devices), such as a splint, brace, or neck collar. Other treatments depend on your symptoms and the amount of nerve damage you have. Other treatments may include:  Medicines, such as: ? Injections of numbing medicine. ? NSAIDs. ? Pain medicines. ? Steroid medicines. These may be given as a pill or as an injection.  Physical therapy to relieve pain, maintain movement, and improve muscle strength.  Surgery. This may be done if other treatments do not work. Follow these instructions at home:      Take over-the-counter and prescription medicines only as told by your health care provider.  Wear supportive or protective devices as told by your health care provider.  Do physical therapy exercises as directed.  Ask your health care provider what activities are safe for you.  Rest as needed.  If directed, put ice on the affected area: ? Put ice in a plastic bag. ? Place a towel between your skin and the bag. ? Leave the ice on for 20 minutes, 2-3 times a day.  If directed, apply heat to the affected area. Use the heat source that your health care provider recommends, such as a moist heat pack or a heating pad. ? Place a towel between your skin and the heat source. ? Leave the heat on for 20-30 minutes. ? Remove the heat if your skin turns bright red. This is especially important if you are unable to feel pain, heat, or cold. You may have a greater risk of getting burned.  Do not drive or use heavy machinery while taking prescription pain medicine.  Keep all follow-up visits as told by your health care provider. This is important. Contact a health care provider if:  Your condition does not improve with treatment.  Your pain, numbness, or weakness suddenly gets worse. Get help right away if you:  Have loss of bladder control (urinary incontinence) or you cannot urinate.  Cannot control bowel movements (fecal incontinence).  Have  new weakness in your arms or legs. Summary  A pinched nerve is an injury that occurs when too much pressure is placed on a nerve.  This pressure can cause pain, burning, and muscle weakness in places that the nerve supplies feeling to, such as an arm, hand, or leg, or the back or neck.  Take over-the-counter and prescription medicines only as told by your health care provider.  Ask your health care provider what activities are safe for you while you are having symptoms. This information is not intended to replace advice given to you by your health care provider. Make sure you discuss any questions you have with your health care provider. Document Revised: 09/08/2017 Document Reviewed: 09/05/2017 Elsevier Patient Education  2020 [employer].

## 2020-08-20 NOTE — Progress Notes (Signed)
Acute Office Visit  Subjective:    Patient ID: [name], female    DOB: February 27, 1971, 49 y.o.   MRN: [id number]  Chief Complaint  Patient presents with  . Acute Visit    Pt c/o left foot numbness, states its been going on for month pt states,the numbness is localized in herbig toe.     HPI Patient is in today with c/o great toe numbness x 1 month. She has a history of Type 2 DM and lumbar radiculopathy. She reports her diabetes has been well controlled on Ozympic.  She sees the pain clinic for management of right hip pain.  She was concerned about circulation and wanted to have it checked. No injury  Past Medical History:  Diagnosis Date  . ADHD (attention deficit hyperactivity disorder)   . Anemia    with pregnancy  . Anxiety   . Arthritis   . Bipolar disorder ([place])   . Chronic back pain greater than 3 months duration   . Depression   . Dysuria    has to have self in and out cath   . Eroded bladder suspension mesh ([place])   . Hepatitis    C  . Hypothyroidism   . Leg pain, bilateral   . Obsessive-compulsive disorder   . Rosacea   . Sciatic nerve pain   . Social anxiety disorder   . Thyroid disease     Past Surgical History:  Procedure Laterality Date  . BACK SURGERY     total of 6 back surgeries  . bladder mesh  2009  . DILITATION & CURRETTAGE/HYSTROSCOPY WITH NOVASURE ABLATION N/A 08/19/2016   Procedure: DILATATION & CURETTAGE WITH NOVASURE ABLATION;  Surgeon: [name], MD;  Location: [facility];  Service: Gynecology;  Laterality: N/A;  . EUS N/A 09/09/2015   Procedure: UPPER ENDOSCOPIC ULTRASOUND (EUS) RADIAL;  Surgeon: [name], MD;  Location: [facility];  Service: Endoscopy;  Laterality: N/A;  . EYE SURGERY    . INCONTINENCE SURGERY    . SPINAL FUSION  2008   times two  . TUBAL LIGATION  2006    Family History  Problem Relation Age of Onset  . Schizophrenia Father   . Schizophrenia Cousin   . Hyperthyroidism Mother   . Hypothyroidism  Maternal Grandmother     Social History   Socioeconomic History  . Marital status: Legally Separated    Spouse name: Not on file  . Number of children: Not on file  . Years of education: Not on file  . Highest education level: Not on file  Occupational History  . Not on file  Tobacco Use  . Smoking status: Light Tobacco Smoker    Packs/day: 1.00    Years: 10.00    Pack years: 10.00    Types: Cigarettes    Last attempt to quit: 02/09/2016    Years since quitting: 4.5  . Smokeless tobacco: Never Used  . Tobacco comment: use vapor  Vaping Use  . Vaping Use: Never used  Substance and Sexual Activity  . Alcohol use: No  . Drug use: No  . Sexual activity: Yes  Other Topics Concern  . Not on file  Social History Narrative  . Not on file   Social Determinants of Health   Financial Resource Strain: Medium Risk  . Difficulty of Paying Living Expenses: Somewhat hard  Food Insecurity: Food Insecurity Present  . Worried About Charity fundraiser in the Last Year: Sometimes true  . Ran Out  of Food in the Last Year: Never true  Transportation Needs: Unmet Transportation Needs  . Lack of Transportation (Medical): Yes  . Lack of Transportation (Non-Medical): No  Physical Activity: Insufficiently Active  . Days of Exercise per Week: 3 days  . Minutes of Exercise per Session: 20 min  Stress: No Stress Concern Present  . Feeling of Stress : Only a little  Social Connections: Socially Isolated  . Frequency of Communication with Friends and Family: More than three times a week  . Frequency of Social Gatherings with Friends and Family: More than three times a week  . Attends Religious Services: Never  . Active Member of Clubs or Organizations: No  . Attends Archivist Meetings: Never  . Marital Status: Separated  Intimate Partner Violence: Not At Risk  . Fear of Current or Ex-Partner: No  . Emotionally Abused: No  . Physically Abused: No  . Sexually Abused: No     Outpatient Medications Prior to Visit  Medication Sig Dispense Refill  . ALPRAZolam (XANAX) 0.5 MG tablet Take 0.5 mg by mouth 2 (two) times daily as needed.    . ARIPiprazole (ABILIFY) 5 MG tablet Take 1 tablet (5 mg total) by mouth daily. 30 tablet 2  . atorvastatin (LIPITOR) 20 MG tablet TAKE 1 TABLET(20 MG) BY MOUTH DAILY 100 tablet 3  . blood glucose meter kit and supplies KIT Dispense based on patient and insurance preference. Check glucose before breakfast. ICD: E11.65 1 each 0  . clomiPRAMINE (ANAFRANIL) 75 MG capsule Take 75 mg by mouth at bedtime.    Marland Kitchen FINACEA 15 % FOAM Apply 1 application topically 2 (two) times daily.     Marland Kitchen gabapentin (NEURONTIN) 300 MG capsule TAKE 1 CAPSULE(300 MG) BY MOUTH THREE TIMES DAILY 90 capsule 2  . glucose blood test strip Use as instructed, check glucose before breakfast. ICD: E11.65 100 each 12  . lamoTRIgine (LAMICTAL) 150 MG tablet Take 150 mg by mouth 2 (two) times daily.    . Lancets (FREESTYLE) lancets Use as instructed. E11.65 100 each 12  . levothyroxine (SYNTHROID) 75 MCG tablet Take 1 tablet (75 mcg total) by mouth daily before breakfast. 90 tablet 0  . lisinopril (ZESTRIL) 10 MG tablet TAKE 1 TABLET(10 MG) BY MOUTH DAILY 100 tablet 1  . metFORMIN (GLUCOPHAGE) 500 MG tablet Take 1 tablet (500 mg total) by mouth 2 (two) times daily with a meal. 60 tablet 5  . methylphenidate (RITALIN) 10 MG tablet Take 1 tablet by mouth 2 (two) times daily.    Marland Kitchen oxyCODONE-acetaminophen (PERCOCET) 7.5-325 MG tablet Take 1 tablet by mouth every 6 (six) hours as needed.    . Semaglutide,0.25 or 0.5MG/DOS, 2 MG/1.5ML SOPN Inject into the skin.    Marland Kitchen HYDROcodone-acetaminophen (NORCO) 10-325 MG tablet Take 1 tablet by mouth 4 (four) times daily as needed.  (Patient not taking: Reported on 08/20/2020)    . metroNIDAZOLE (FLAGYL) 500 MG tablet Take 1 tablet (500 mg total) by mouth 2 (two) times daily. (Patient not taking: No sig reported) 14 tablet 2  . phentermine  15 MG capsule Take by mouth.     No facility-administered medications prior to visit.    Allergies  Allergen Reactions  . Pregabalin Anaphylaxis  . Celebrex [Celecoxib] Nausea And Vomiting    Stomach pain  . Effexor [Venlafaxine Hcl] Other (See Comments)    Serotonin toxicity  . Mirapex [Pramipexole]     Mirapex- Worsening "body jerks"  . Pristiq [Desvenlafaxine] Other (See Comments)  Serotonin toxicity   . Prozac [Fluoxetine Hcl] Swelling  . Symbyax [Olanzapine-Fluoxetine Hcl] Swelling  . Wellbutrin [Bupropion Hcl] Swelling    Review of Systems  Constitutional: Negative.   Respiratory: Negative.   Cardiovascular: Negative.   Musculoskeletal: Negative.   Skin: Negative.   Neurological: Positive for numbness.  Hematological: Negative.   Psychiatric/Behavioral: Negative.   All other systems reviewed and are negative.      Objective:    Physical Exam Constitutional:      Appearance: Normal appearance.  HENT:     Mouth/Throat:     Mouth: Mucous membranes are dry.  Cardiovascular:     Rate and Rhythm: Normal rate and regular rhythm.     Pulses:          Dorsalis pedis pulses are 2+ on the right side and 2+ on the left side.       Posterior tibial pulses are 2+ on the right side and 2+ on the left side.  Pulmonary:     Effort: Pulmonary effort is normal.     Breath sounds: Normal breath sounds.  Abdominal:     General: Abdomen is flat.     Palpations: Abdomen is soft.  Musculoskeletal:        General: No swelling, tenderness, deformity or signs of injury. Normal range of motion.     Cervical back: Normal range of motion and neck supple.  Feet:     Right foot:     Skin integrity: Skin integrity normal.     Left foot:     Skin integrity: Skin integrity normal.  Skin:    General: Skin is warm and dry.  Neurological:     General: No focal deficit present.     Mental Status: She is alert and oriented to person, place, and time. Mental status is at baseline.      Cranial Nerves: No cranial nerve deficit.     Sensory: No sensory deficit.  Psychiatric:        Mood and Affect: Mood normal.        Behavior: Behavior normal.     BP 118/64 (BP Location: Left Arm, Patient Position: Sitting, Cuff Size: Normal)   Pulse 78   Temp (!) 97.1 F (36.2 C) (Temporal)   Wt 241 lb (109.3 kg)   LMP 08/12/2020 (Approximate)   SpO2 100%   BMI 41.37 kg/m  Wt Readings from Last 3 Encounters:  08/20/20 241 lb (109.3 kg)  08/11/20 239 lb (108.4 kg)  07/07/20 250 lb (113.4 kg)    Health Maintenance Due  Topic Date Due  . FOOT EXAM  Never done  . HIV Screening  Never done    There are no preventive care reminders to display for this patient.   Lab Results  Component Value Date   TSH 2.18 07/07/2020   Lab Results  Component Value Date   WBC 14.3 (H) 07/07/2020   HGB 12.9 07/07/2020   HCT 39.1 07/07/2020   MCV 80.4 07/07/2020   PLT 347.0 07/07/2020   Lab Results  Component Value Date   NA 135 07/07/2020   K 3.9 07/07/2020   CO2 29 07/07/2020   GLUCOSE 83 07/07/2020   BUN 9 07/07/2020   CREATININE 0.73 07/07/2020   BILITOT 0.4 07/07/2020   ALKPHOS 81 07/07/2020   AST 14 07/07/2020   ALT 18 07/07/2020   PROT 6.9 07/07/2020   ALBUMIN 4.1 07/07/2020   CALCIUM 9.6 07/07/2020   ANIONGAP 10 12/30/2019   GFR  97.84 07/07/2020   Lab Results  Component Value Date   CHOL 166 11/12/2018   Lab Results  Component Value Date   HDL 35.80 (L) 11/12/2018   Lab Results  Component Value Date   LDLCALC 174 (H) 04/15/2015   Lab Results  Component Value Date   TRIG 221.0 (H) 11/12/2018   Lab Results  Component Value Date   CHOLHDL 5 11/12/2018   Lab Results  Component Value Date   HGBA1C 6.1 07/07/2020       Assessment & Plan:   Problem List Items Addressed This Visit    Type 2 diabetes mellitus with hyperglycemia, without long-term current use of insulin ([place])   Sciatica    Other Visit Diagnoses    Numbness of toes    -  Primary        Advised to call orthopedics to discuss sciatica. Numbness in the toes likely related to diabetic neuropathy and pinch nerve in the back.    Kennyth Arnold, FNP

## 2020-08-24 DIAGNOSIS — E1165 Type 2 diabetes mellitus with hyperglycemia: Secondary | ICD-10-CM | POA: Diagnosis not present

## 2020-08-24 DIAGNOSIS — Z6841 Body Mass Index (BMI) 40.0 and over, adult: Secondary | ICD-10-CM | POA: Diagnosis not present

## 2020-08-25 DIAGNOSIS — Z6841 Body Mass Index (BMI) 40.0 and over, adult: Secondary | ICD-10-CM | POA: Diagnosis not present

## 2020-08-26 ENCOUNTER — Ambulatory Visit (INDEPENDENT_AMBULATORY_CARE_PROVIDER_SITE_OTHER): Payer: PPO | Admitting: Orthopaedic Surgery

## 2020-08-26 ENCOUNTER — Encounter: Payer: Self-pay | Admitting: Orthopaedic Surgery

## 2020-08-26 ENCOUNTER — Ambulatory Visit (INDEPENDENT_AMBULATORY_CARE_PROVIDER_SITE_OTHER): Payer: PPO

## 2020-08-26 ENCOUNTER — Other Ambulatory Visit: Payer: Self-pay

## 2020-08-26 VITALS — Ht 66.0 in | Wt 239.6 lb

## 2020-08-26 DIAGNOSIS — M1612 Unilateral primary osteoarthritis, left hip: Secondary | ICD-10-CM

## 2020-08-26 NOTE — Progress Notes (Signed)
Office Visit Note   Patient: [name]           Date of Birth: 22-Apr-1971           MRN: [id number] Visit Date: 08/26/2020              Requested by: Libby Maw, MD [address] [place],  [place] [address] PCP: Libby Maw, MD   Assessment & Plan: Visit Diagnoses:  1. Unilateral primary osteoarthritis, left hip     Plan: Impression is left hip DJD.  I again reviewed the MRI which shows articular cartilage loss with severely degenerative labral tears.  X-rays today are also consistent with DJD.  Given these findings and the fact that she has not received any significant relief from conservative treatments I have recommended total hip placement now that her BMI is under 40.  She risk benefits rehab recovery reviewed.  We will notify her [facility] of her upcoming surgery so that they are aware that we will be prescribing her postoperative pain medications for about 6 weeks.  She will continue to lose weight until her surgery which she would like to have sometime in January or February.  Questions encouraged and answered.  Follow-Up Instructions: Return for 2 week postop .   Orders:  Orders Placed This Encounter  Procedures  . XR HIP UNILAT W OR W/O PELVIS 2-3 VIEWS LEFT   No orders of the defined types were placed in this encounter.     Procedures: No procedures performed   Clinical Data: No additional findings.   Subjective: Chief Complaint  Patient presents with  . Left Hip - Pain    [name] is a 49 year old female with DJD of the left hip based on prior MRI.  We have postponed hip replacement surgery due to increased BMI.  She has been at the [facility] weight loss clinic and has lost 50 pounds.  She continues to have severe left hip and groin pain.  Conservative treatments such as medications and weight loss and activity modifications and cortisone injections have given her only temporary and partial relief.  She is  still interested in undergoing a hip replacement.  She did develop diabetes as a result of her obesity but her recent A1c was 6.1.   Review of Systems  Constitutional: Negative.   HENT: Negative.   Eyes: Negative.   Respiratory: Negative.   Cardiovascular: Negative.   Endocrine: Negative.   Musculoskeletal: Negative.   Neurological: Negative.   Hematological: Negative.   Psychiatric/Behavioral: Negative.   All other systems reviewed and are negative.    Objective: Vital Signs: Ht 5\' 6"  (1.676 m)   Wt 239 lb 9.6 oz (108.7 kg)   LMP 08/12/2020 (Approximate)   BMI 38.67 kg/m   Physical Exam Vitals and nursing note reviewed.  Constitutional:      Appearance: She is well-developed and well-nourished.  Pulmonary:     Effort: Pulmonary effort is normal.  Skin:    General: Skin is warm.     Capillary Refill: Capillary refill takes less than 2 seconds.  Neurological:     Mental Status: She is alert and oriented to person, place, and time.  Psychiatric:        Mood and Affect: Mood and affect normal.        Behavior: Behavior normal.        Thought Content: Thought content normal.        Judgment: Judgment normal.  Ortho Exam Left hip shows painful logroll and pain with hip flexion and internal rotation.  Mild limitation range of motion secondary to pain.  Lateral hip is nontender. Specialty Comments:  No specialty comments available.  Imaging: XR HIP UNILAT W OR W/O PELVIS 2-3 VIEWS LEFT  Result Date: 08/26/2020 Moderate joint space narrowing of the left hip joint    PMFS History: Patient Active Problem List   Diagnosis Date Noted  . Type 2 diabetes mellitus with hyperglycemia, without long-term current use of insulin ([place]) 04/02/2020  . Peripheral edema 04/02/2020  . COVID-19 07/09/2019  . Left hip pain 04/30/2019  . Otitis externa of left ear 04/30/2019  . Cholesteatoma of left ear 04/30/2019  . Dysfunction of left eustachian tube 04/30/2019  . Left ear  pain 04/30/2019  . Sciatica 04/19/2019  . Class 3 severe obesity due to excess calories with serious comorbidity and body mass index (BMI) of 45.0 to 49.9 in adult ([place]) 04/19/2019  . Post-nasal drip 01/10/2019  . Allergic cough 01/10/2019  . Obstructive sleep apnea syndrome 12/11/2018  . Acute frontal sinusitis 12/10/2018  . Primary osteoarthritis of left hip 11/13/2018  . Body mass index 45.0-49.9, adult ([place]) 11/13/2018  . Morbid obesity ([place]) 11/13/2018  . Elevated cholesterol 11/12/2018  . Essential hypertension 11/12/2018  . Allergic rhinitis due to pollen 11/12/2018  . Reactive airway disease 11/12/2018  . Breast pain, right 07/17/2018  . Prediabetes 07/17/2018  . Refused influenza vaccine 07/17/2018  . Chronic nonintractable headache 02/12/2018  . Dysuria 02/12/2018  . Localized edema 02/12/2018  . Hospital discharge follow-up 01/23/2018  . Bladder problem 01/05/2018  . Depression 01/05/2018  . Insomnia 01/05/2018  . Hepatitis C infection 12/01/2015  . RLS (restless legs syndrome) 10/23/2015  . Elevated liver enzymes 10/22/2015  . Recurrent major depression-severe ([place]) 04/17/2015  . GAD (generalized anxiety disorder) 04/15/2015  . Bipolar depression ([place]) 04/15/2015  . Severe recurrent major depression w/psychotic features, mood-congruent ([place]) 04/14/2015  . Anxiety disorder 04/14/2015  . Major depressive disorder, recurrent severe without psychotic features ([place]) 04/14/2015  . Suicidal ideations   . Chronic LBP 01/02/2015  . Adult hypothyroidism 06/25/2012  . Cobalamin deficiency 06/25/2012  . Fatigue 06/25/2012  . Chronic arthralgias of knees and hips 06/25/2012  . Leukocytosis 06/25/2012  . Vitamin D deficiency 06/25/2012   Past Medical History:  Diagnosis Date  . ADHD (attention deficit hyperactivity disorder)   . Anemia    with pregnancy  . Anxiety   . Arthritis   . Bipolar disorder ([place])   . Chronic back pain greater than 3 months duration   .  Depression   . Dysuria    has to have self in and out cath   . Eroded bladder suspension mesh ([place])   . Hepatitis    C  . Hypothyroidism   . Leg pain, bilateral   . Obsessive-compulsive disorder   . Rosacea   . Sciatic nerve pain   . Social anxiety disorder   . Thyroid disease     Family History  Problem Relation Age of Onset  . Schizophrenia Father   . Schizophrenia Cousin   . Hyperthyroidism Mother   . Hypothyroidism Maternal Grandmother     Past Surgical History:  Procedure Laterality Date  . BACK SURGERY     total of 6 back surgeries  . bladder mesh  2009  . DILITATION & CURRETTAGE/HYSTROSCOPY WITH NOVASURE ABLATION N/A 08/19/2016   Procedure: DILATATION & CURETTAGE WITH NOVASURE ABLATION;  Surgeon: [name],  MD;  Location: [facility];  Service: Gynecology;  Laterality: N/A;  . EUS N/A 09/09/2015   Procedure: UPPER ENDOSCOPIC ULTRASOUND (EUS) RADIAL;  Surgeon: [name], MD;  Location: [facility];  Service: Endoscopy;  Laterality: N/A;  . EYE SURGERY    . INCONTINENCE SURGERY    . SPINAL FUSION  2008   times two  . TUBAL LIGATION  2006   Social History   Occupational History  . Not on file  Tobacco Use  . Smoking status: Light Tobacco Smoker    Packs/day: 1.00    Years: 10.00    Pack years: 10.00    Types: Cigarettes    Last attempt to quit: 02/09/2016    Years since quitting: 4.5  . Smokeless tobacco: Never Used  . Tobacco comment: use vapor  Vaping Use  . Vaping Use: Never used  Substance and Sexual Activity  . Alcohol use: No  . Drug use: No  . Sexual activity: Yes

## 2020-08-27 LAB — PREALBUMIN: Prealbumin: 22 mg/dL (ref 17–34)

## 2020-08-27 LAB — EXTRA LAV TOP TUBE

## 2020-09-10 DIAGNOSIS — M25552 Pain in left hip: Secondary | ICD-10-CM | POA: Diagnosis not present

## 2020-09-10 DIAGNOSIS — E119 Type 2 diabetes mellitus without complications: Secondary | ICD-10-CM | POA: Diagnosis not present

## 2020-09-10 DIAGNOSIS — Z79899 Other long term (current) drug therapy: Secondary | ICD-10-CM | POA: Diagnosis not present

## 2020-09-10 DIAGNOSIS — I10 Essential (primary) hypertension: Secondary | ICD-10-CM | POA: Diagnosis not present

## 2020-09-15 DIAGNOSIS — Z6841 Body Mass Index (BMI) 40.0 and over, adult: Secondary | ICD-10-CM | POA: Diagnosis not present

## 2020-09-16 ENCOUNTER — Other Ambulatory Visit: Payer: Self-pay | Admitting: Family Medicine

## 2020-09-16 DIAGNOSIS — M5430 Sciatica, unspecified side: Secondary | ICD-10-CM

## 2020-09-17 DIAGNOSIS — Z6841 Body Mass Index (BMI) 40.0 and over, adult: Secondary | ICD-10-CM | POA: Diagnosis not present

## 2020-10-06 NOTE — Pre-Procedure Instructions (Signed)
[id number] [name], [place] AT [place] [address] Sandrea Matte [place] Kim Phone: [phone] Fax: [phone]      Your procedure is scheduled on Monday, February 7th.  Report to [facility] Main Entrance "A" at 5:30 A.M., and check in at the Admitting office.  Call this number if you have problems the morning of surgery:  [phone]  Call [phone] if you have any questions prior to your surgery date Monday-Friday 8am-4pm    Remember:  Do not eat after midnight the night before your surgery  You may drink clear liquids until 4:15 A.M. the morning of your surgery.   Clear liquids allowed are: Water, Non-Citrus Juices (without pulp), Carbonated Beverages, Clear Tea, Black Coffee Only, and Gatorade.  Please complete the 10oz bottle of water that was given to you by 4:15 a.m. the morning of surgery.     Take these medicines the morning of surgery with A SIP OF WATER  ARIPiprazole (ABILIFY) atorvastatin (LIPITOR) gabapentin (NEURONTIN) lamoTRIgine (LAMICTAL)  levothyroxine (SYNTHROID)  ALPRAZolam [name]) -use as needed oxyCODONE-acetaminophen (PERCOCET) -use as  Needed.  PLEASE STOP PHENTERMINE AS OF TODAY.  Follow your surgeon's instructions on when to stop Aspirin.  If no instructions were given by your surgeon then you will need to call the office to get those instructions.    As of today, STOP taking any Aspirin (unless otherwise instructed by your surgeon) Aleve, Naproxen, Ibuprofen, Motrin, Advil, Goody's, BC's, all herbal medications, fish oil, and all vitamins.   WHAT DO I DO ABOUT MY DIABETES MEDICATION?   Marland Kitchen Do not take metFORMIN (GLUCOPHAGE) the morning of surgery.    . The day of surgery, do not take Semaglutide injectable.   HOW TO MANAGE YOUR DIABETES BEFORE AND AFTER SURGERY  Why is it important to control my blood sugar before and after surgery? . Improving blood sugar  levels before and after surgery helps healing and can limit problems. . A way of improving blood sugar control is eating a healthy diet by: o  Eating less sugar and carbohydrates o  Increasing activity/exercise o  Talking with your doctor about reaching your blood sugar goals . High blood sugars (greater than 180 mg/dL) can raise your risk of infections and slow your recovery, so you will need to focus on controlling your diabetes during the weeks before surgery. . Make sure that the doctor who takes care of your diabetes knows about your planned surgery including the date and location.  How do I manage my blood sugar before surgery? . Check your blood sugar at least 4 times a day, starting 2 days before surgery, to make sure that the level is not too high or low. . Check your blood sugar the morning of your surgery when you wake up and every 2 hours until you get to the Short Stay unit. o If your blood sugar is less than 70 mg/dL, you will need to treat for low blood sugar: - Do not take insulin. - Treat a low blood sugar (less than 70 mg/dL) with  cup of clear juice (cranberry or apple), 4 glucose tablets, OR glucose gel. - Recheck blood sugar in 15 minutes after treatment (to make sure it is greater than 70 mg/dL). If your blood sugar is not greater than 70 mg/dL on recheck, call [phone] for further instructions. . Report your blood sugar to the short stay nurse when you get to Short Stay.  Marland Kitchen  If you are admitted to the hospital after surgery: o Your blood sugar will be checked by the staff and you will probably be given insulin after surgery (instead of oral diabetes medicines) to make sure you have good blood sugar levels. o The goal for blood sugar control after surgery is 80-180 mg/dL.                    Do not wear jewelry, make up, or nail polish.            Do not wear lotions, powders, perfumes, or deodorant.            Do not shave 48 hours prior to surgery.             Do  not bring valuables to the hospital.            [facility] is not responsible for any belongings or valuables.  Do NOT Smoke (Tobacco/Vaping) or drink Alcohol 24 hours prior to your procedure If you use a CPAP at night, you may bring all equipment for your overnight stay.   Contacts, glasses, dentures or bridgework may not be worn into surgery.      For patients admitted to the hospital, discharge time will be determined by your treatment team.   Patients discharged the day of surgery will not be allowed to drive home, and someone needs to stay with them for 24 hours.    Special instructions:   [place]- Preparing For Surgery  Before surgery, you can play an important role. Because skin is not sterile, your skin needs to be as free of germs as possible. You can reduce the number of germs on your skin by washing with CHG (chlorahexidine gluconate) Soap before surgery.  CHG is an antiseptic cleaner which kills germs and bonds with the skin to continue killing germs even after washing.    Oral Hygiene is also important to reduce your risk of infection.  Remember - BRUSH YOUR TEETH THE MORNING OF SURGERY WITH YOUR REGULAR TOOTHPASTE  Please do not use if you have an allergy to CHG or antibacterial soaps. If your skin becomes reddened/irritated stop using the CHG.  Do not shave (including legs and underarms) for at least 48 hours prior to first CHG shower. It is OK to shave your face.  Please follow these instructions carefully.   1. Shower the NIGHT BEFORE SURGERY and the MORNING OF SURGERY with CHG Soap.   2. If you chose to wash your hair, wash your hair first as usual with your normal shampoo.  3. After you shampoo, rinse your hair and body thoroughly to remove the shampoo.  4. Use CHG as you would any other liquid soap. You can apply CHG directly to the skin and wash gently with a scrungie or a clean washcloth.   5. Apply the CHG Soap to your body ONLY FROM THE NECK DOWN.  Do  not use on open wounds or open sores. Avoid contact with your eyes, ears, mouth and genitals (private parts). Wash Face and genitals (private parts)  with your normal soap.   6. Wash thoroughly, paying special attention to the area where your surgery will be performed.  7. Thoroughly rinse your body with warm water from the neck down.  8. DO NOT shower/wash with your normal soap after using and rinsing off the CHG Soap.  9. Pat yourself dry with a CLEAN TOWEL.  10. Wear CLEAN PAJAMAS to bed  the night before surgery  11. Place CLEAN SHEETS on your bed the night of your first shower and DO NOT SLEEP WITH PETS.   Day of Surgery: Wear Clean/Comfortable clothing the morning of surgery Do not apply any deodorants/lotions.   Remember to brush your teeth WITH YOUR REGULAR TOOTHPASTE.   Please read over the following fact sheets that you were given.

## 2020-10-07 ENCOUNTER — Inpatient Hospital Stay (HOSPITAL_COMMUNITY)
Admission: RE | Admit: 2020-10-07 | Discharge: 2020-10-07 | Disposition: A | Discharge status: Home / Self Care | Payer: PPO | Source: Ambulatory Visit

## 2020-10-07 ENCOUNTER — Ambulatory Visit: Payer: PPO | Admitting: Family Medicine

## 2020-10-07 ENCOUNTER — Telehealth: Payer: Self-pay | Admitting: Orthopaedic Surgery

## 2020-10-07 NOTE — Telephone Encounter (Signed)
Received vm from patient. Wanting note advising of upcoming hip replacement surgery faxed to [facility]. 08/26/2020 ov note faxed [facility] [phone] number pt left). IC,lmvm advised pt ov note was faxed.

## 2020-10-08 ENCOUNTER — Other Ambulatory Visit: Payer: Self-pay

## 2020-10-08 ENCOUNTER — Other Ambulatory Visit (HOSPITAL_COMMUNITY)
Admission: RE | Admit: 2020-10-08 | Discharge: 2020-10-08 | Disposition: A | Discharge status: Home / Self Care | Payer: PPO | Source: Ambulatory Visit | Attending: Orthopaedic Surgery | Admitting: Orthopaedic Surgery

## 2020-10-08 ENCOUNTER — Other Ambulatory Visit: Payer: Self-pay | Admitting: Physician Assistant

## 2020-10-08 ENCOUNTER — Encounter (HOSPITAL_COMMUNITY): Payer: Self-pay | Admitting: Orthopaedic Surgery

## 2020-10-08 ENCOUNTER — Encounter (HOSPITAL_COMMUNITY): Payer: Self-pay

## 2020-10-08 ENCOUNTER — Encounter (HOSPITAL_COMMUNITY)
Admission: RE | Admit: 2020-10-08 | Discharge: 2020-10-08 | Disposition: A | Discharge status: Home / Self Care | Payer: PPO | Source: Ambulatory Visit | Attending: Orthopaedic Surgery | Admitting: Orthopaedic Surgery

## 2020-10-08 DIAGNOSIS — Z79899 Other long term (current) drug therapy: Secondary | ICD-10-CM | POA: Diagnosis not present

## 2020-10-08 DIAGNOSIS — Z01812 Encounter for preprocedural laboratory examination: Secondary | ICD-10-CM | POA: Insufficient documentation

## 2020-10-08 DIAGNOSIS — I10 Essential (primary) hypertension: Secondary | ICD-10-CM | POA: Diagnosis not present

## 2020-10-08 DIAGNOSIS — A4902 Methicillin resistant Staphylococcus aureus infection, unspecified site: Secondary | ICD-10-CM | POA: Diagnosis not present

## 2020-10-08 DIAGNOSIS — M25552 Pain in left hip: Secondary | ICD-10-CM | POA: Diagnosis not present

## 2020-10-08 DIAGNOSIS — Z20822 Contact with and (suspected) exposure to covid-19: Secondary | ICD-10-CM | POA: Insufficient documentation

## 2020-10-08 DIAGNOSIS — E119 Type 2 diabetes mellitus without complications: Secondary | ICD-10-CM | POA: Diagnosis not present

## 2020-10-08 HISTORY — DX: Gastro-esophageal reflux disease without esophagitis: K21.9

## 2020-10-08 HISTORY — DX: Type 2 diabetes mellitus with diabetic nephropathy: E11.21

## 2020-10-08 HISTORY — DX: Personal history of other diseases of the nervous system and sense organs: Z86.69

## 2020-10-08 HISTORY — DX: Personal history of Methicillin resistant Staphylococcus aureus infection: Z86.14

## 2020-10-08 LAB — CBC WITH DIFFERENTIAL/PLATELET
Abs Immature Granulocytes: 0.17 10*3/uL — ABNORMAL HIGH (ref 0.00–0.07)
Basophils Absolute: 0.1 10*3/uL (ref 0.0–0.1)
Basophils Relative: 1 %
Eosinophils Absolute: 0.2 10*3/uL (ref 0.0–0.5)
Eosinophils Relative: 2 %
HCT: 41.5 % (ref 36.0–46.0)
Hemoglobin: 13 g/dL (ref 12.0–15.0)
Immature Granulocytes: 1 %
Lymphocytes Relative: 27 %
Lymphs Abs: 4 10*3/uL (ref 0.7–4.0)
MCH: 26.5 pg (ref 26.0–34.0)
MCHC: 31.3 g/dL (ref 30.0–36.0)
MCV: 84.5 fL (ref 80.0–100.0)
Monocytes Absolute: 0.7 10*3/uL (ref 0.1–1.0)
Monocytes Relative: 5 %
Neutro Abs: 9.4 10*3/uL — ABNORMAL HIGH (ref 1.7–7.7)
Neutrophils Relative %: 64 %
Platelets: 330 10*3/uL (ref 150–400)
RBC: 4.91 MIL/uL (ref 3.87–5.11)
RDW: 16.6 % — ABNORMAL HIGH (ref 11.5–15.5)
WBC: 14.6 10*3/uL — ABNORMAL HIGH (ref 4.0–10.5)
nRBC: 0 % (ref 0.0–0.2)

## 2020-10-08 LAB — COMPREHENSIVE METABOLIC PANEL
ALT: 29 U/L (ref 0–44)
AST: 15 U/L (ref 15–41)
Albumin: 4 g/dL (ref 3.5–5.0)
Alkaline Phosphatase: 87 U/L (ref 38–126)
Anion gap: 9 (ref 5–15)
BUN: 14 mg/dL (ref 6–20)
CO2: 27 mmol/L (ref 22–32)
Calcium: 9.3 mg/dL (ref 8.9–10.3)
Chloride: 97 mmol/L — ABNORMAL LOW (ref 98–111)
Creatinine, Ser: 0.82 mg/dL (ref 0.44–1.00)
GFR, Estimated: >60 mL/min (ref >60)
Glucose, Bld: 89 mg/dL (ref 70–99)
Potassium: 3.6 mmol/L (ref 3.5–5.1)
Sodium: 133 mmol/L — ABNORMAL LOW (ref 135–145)
Total Bilirubin: 0.4 mg/dL (ref 0.3–1.2)
Total Protein: 6.8 g/dL (ref 6.5–8.1)

## 2020-10-08 LAB — APTT: aPTT: 30 seconds (ref 24–36)

## 2020-10-08 LAB — PROTIME-INR
INR: 1 (ref 0.8–1.2)
Prothrombin Time: 13 seconds (ref 11.4–15.2)

## 2020-10-08 LAB — HEMOGLOBIN A1C
Hgb A1c MFr Bld: 5.5 % (ref 4.8–5.6)
Mean Plasma Glucose: 111.15 mg/dL

## 2020-10-08 LAB — SURGICAL PCR SCREEN
MRSA, PCR: POSITIVE — AB
Staphylococcus aureus: POSITIVE — AB

## 2020-10-08 LAB — SARS CORONAVIRUS 2 (TAT 6-24 HRS): SARS Coronavirus 2: NEGATIVE

## 2020-10-08 LAB — GLUCOSE, CAPILLARY: Glucose-Capillary: 97 mg/dL (ref 70–99)

## 2020-10-08 MED ORDER — METHOCARBAMOL 500 MG PO TABS: 500.0000 mg | ORAL_TABLET | Freq: Two times a day (BID) | ORAL | 0 refills | Status: DC | PRN

## 2020-10-08 MED ORDER — ONDANSETRON HCL 4 MG PO TABS: 4.0000 mg | ORAL_TABLET | Freq: Three times a day (TID) | ORAL | 0 refills | Status: DC | PRN

## 2020-10-08 MED ORDER — DOCUSATE SODIUM 100 MG PO CAPS: 100.0000 mg | ORAL_CAPSULE | Freq: Every day | ORAL | 2 refills | Status: DC | PRN

## 2020-10-08 MED ORDER — OXYCODONE-ACETAMINOPHEN 5-325 MG PO TABS: 1.0000 | ORAL_TABLET | Freq: Four times a day (QID) | ORAL | 0 refills | Status: DC | PRN

## 2020-10-08 MED ORDER — SULFAMETHOXAZOLE-TRIMETHOPRIM 800-160 MG PO TABS: 1.0000 | ORAL_TABLET | Freq: Two times a day (BID) | ORAL | 0 refills | Status: DC

## 2020-10-08 MED ORDER — ASPIRIN EC 81 MG PO TBEC: 81.0000 mg | DELAYED_RELEASE_TABLET | Freq: Two times a day (BID) | ORAL | 0 refills | Status: DC

## 2020-10-08 NOTE — Patient Instructions (Addendum)
DUE TO COVID-19 ONLY ONE VISITOR IS ALLOWED TO COME WITH YOU AND STAY IN THE WAITING ROOM ONLY DURING PRE OP AND PROCEDURE.   IF YOU WILL BE ADMITTED INTO THE HOSPITAL YOU ARE ALLOWED ONE SUPPORT PERSON DURING VISITATION HOURS ONLY (10AM -8PM)   . The support person may change daily. . The support person must pass our screening, gel in and out, and wear a mask at all times, including in the patient's room. . Patients must also wear a mask when staff or their support person are in the room.   COVID SWAB TESTING MUST BE COMPLETED ON:  Saturday, Feb. 5, 2022 at 9:10AM   [address] [place] [facility] [place], [place] [address]  (Must self quarantine after testing. Follow instructions on handout.)   Your procedure is scheduled on: Monday, Feb. 7 th, 2022   Report to [facility] Main  Entrance   Report to Short Stay at 5:30 AM   [facility])   Call this number if you have problems the morning of surgery [phone]   Do not eat food :After Midnight.   May have liquids until  4:15 AM  day of surgery  CLEAR LIQUID DIET  Foods Allowed                                                                     Foods Excluded  Water, Black Coffee and tea, regular and decaf                             liquids that you cannot  Plain Jell-O in any flavor  (No red)                                           see through such as: Fruit ices (not with fruit pulp)                                     milk, soups, orange juice              Iced Popsicles (No red)                                    All solid food                                   Apple juices Sports drinks like Gatorade (No red) Lightly seasoned clear broth or consume(fat free) Sugar, honey syrup  Sample Menu Breakfast                                Lunch                                     Supper  Cranberry juice                    Beef broth                            Chicken broth Jell-O                                     Grape juice                            Apple juice Coffee or tea                        Jell-O                                      Popsicle                                                Coffee or tea                        Coffee or tea        1. The day of surgery:  ? Drink ONE (1)  G2 by am the morning of surgery. Drink in one sitting. Do not sip.  ? This drink was given to you during your hospital  pre-op appointment visit. ? Nothing else to drink after completing the G2.          If you have questions, please contact your surgeon's office.     Oral Hygiene is also important to reduce your risk of infection.                                    Remember - BRUSH YOUR TEETH THE MORNING OF SURGERY WITH YOUR REGULAR TOOTHPASTE   Do NOT smoke after Midnight   Take these medicines the morning of surgery with A SIP OF WATER: Lamotrigine, Levothyroxine, Xanax if needed  DO NOT TAKE ANY ORAL DIABETIC MEDICATIONS DAY OF YOUR SURGERY                               You may not have any metal on your body including hair pins, jewelry, and body piercings             Do not wear make-up, lotions, powders, perfumes/cologne, or deodorant             Do not wear nail polish.  Do not shave  48 hours prior to surgery.               Do not bring valuables to the hospital. [place].   Contacts, dentures or bridgework may not be worn into surgery.   Bring small overnight bag day of surgery.    Special Instructions: Bring a  copy of your healthcare power of attorney and living will documents         the day of surgery if you haven't scanned them in before.              Please read over the following fact sheets you were given: IF YOU HAVE QUESTIONS ABOUT YOUR PRE OP [place] [phone]  How to Manage Your Diabetes Before and After Surgery  Why is it important to control my blood sugar before and after surgery? . Improving blood sugar levels before  and after surgery helps healing and can limit problems. . A way of improving blood sugar control is eating a healthy diet by: o  Eating less sugar and carbohydrates o  Increasing activity/exercise o  Talking with your doctor about reaching your blood sugar goals . High blood sugars (greater than 180 mg/dL) can raise your risk of infections and slow your recovery, so you will need to focus on controlling your diabetes during the weeks before surgery. . Make sure that the doctor who takes care of your diabetes knows about your planned surgery including the date and location.  How do I manage my blood sugar before surgery? . Check your blood sugar at least 4 times a day, starting 2 days before surgery, to make sure that the level is not too high or low. o Check your blood sugar the morning of your surgery when you wake up and every 2 hours until you get to the Short Stay unit. . If your blood sugar is less than 70 mg/dL, you will need to treat for low blood sugar: o Do not take insulin. o Treat a low blood sugar (less than 70 mg/dL) with  cup of clear juice (cranberry or apple), 4 glucose tablets, OR glucose gel. o Recheck blood sugar in 15 minutes after treatment (to make sure it is greater than 70 mg/dL). If your blood sugar is not greater than 70 mg/dL on recheck, call [phone] for further instructions. . Report your blood sugar to the short stay nurse when you get to Short Stay.  . If you are admitted to the hospital after surgery: o Your blood sugar will be checked by the staff and you will probably be given insulin after surgery (instead of oral diabetes medicines) to make sure you have good blood sugar levels. o The goal for blood sugar control after surgery is 80-180 mg/dL.   WHAT DO I DO ABOUT MY DIABETES MEDICATION?  Marland Kitchen Do not take oral diabetes medicines (pills) the morning of surgery.   Reviewed and Endorsed by [facility] Patient Education Committee, August 2015  [facility] - Preparing for Surgery Before surgery, you can play an important role.  Because skin is not sterile, your skin needs to be as free of germs as possible.  You can reduce the number of germs on your skin by washing with CHG (chlorahexidine gluconate) soap before surgery.  CHG is an antiseptic cleaner which kills germs and bonds with the skin to continue killing germs even after washing. Please DO NOT use if you have an allergy to CHG or antibacterial soaps.  If your skin becomes reddened/irritated stop using the CHG and inform your nurse when you arrive at Short Stay. Do not shave (including legs and underarms) for at least 48 hours prior to the first CHG shower.  You may shave your face/neck.  Please follow these instructions carefully:  1.  Shower with CHG Soap the night  before surgery and the  morning of surgery.  2.  If you choose to wash your hair, wash your hair first as usual with your normal  shampoo.  3.  After you shampoo, rinse your hair and body thoroughly to remove the shampoo.                             4.  Use CHG as you would any other liquid soap.  You can apply chg directly to the skin and wash.  Gently with a scrungie or clean washcloth.  5.  Apply the CHG Soap to your body ONLY FROM THE NECK DOWN.   Do   not use on face/ open                           Wound or open sores. Avoid contact with eyes, ears mouth and   genitals (private parts).                       Wash face,  Genitals (private parts) with your normal soap.             6.  Wash thoroughly, paying special attention to the area where your    surgery  will be performed.  7.  Thoroughly rinse your body with warm water from the neck down.  8.  DO NOT shower/wash with your normal soap after using and rinsing off the CHG Soap.                9.  Pat yourself dry with a clean towel.            10.  Wear clean pajamas.            11.  Place clean sheets on your bed the night of your first shower and do not  sleep with  pets. Day of Surgery : Do not apply any lotions/deodorants the morning of surgery.  Please wear clean clothes to the hospital/surgery center.  FAILURE TO FOLLOW THESE INSTRUCTIONS MAY RESULT IN THE CANCELLATION OF YOUR SURGERY  PATIENT SIGNATURE_________________________________  NURSE SIGNATURE__________________________________  ________________________________________________________________________   Adam Phenix  An incentive spirometer is a tool that can help keep your lungs clear and active. This tool measures how well you are filling your lungs with each breath. Taking long deep breaths may help reverse or decrease the chance of developing breathing (pulmonary) problems (especially infection) following:  A long period of time when you are unable to move or be active. BEFORE THE PROCEDURE   If the spirometer includes an indicator to show your best effort, your nurse or respiratory therapist will set it to a desired goal.  If possible, sit up straight or lean slightly forward. Try not to slouch.  Hold the incentive spirometer in an upright position. INSTRUCTIONS FOR USE  1. Sit on the edge of your bed if possible, or sit up as far as you can in bed or on a chair. 2. Hold the incentive spirometer in an upright position. 3. Breathe out normally. 4. Place the mouthpiece in your mouth and seal your lips tightly around it. 5. Breathe in slowly and as deeply as possible, raising the piston or the ball toward the top of the column. 6. Hold your breath for 3-5 seconds or for as long as possible. Allow the piston or ball to fall to the bottom  of the column. 7. Remove the mouthpiece from your mouth and breathe out normally. 8. Rest for a few seconds and repeat Steps 1 through 7 at least 10 times every 1-2 hours when you are awake. Take your time and take a few normal breaths between deep breaths. 9. The spirometer may include an indicator to show your best effort. Use the  indicator as a goal to work toward during each repetition. 10. After each set of 10 deep breaths, practice coughing to be sure your lungs are clear. If you have an incision (the cut made at the time of surgery), support your incision when coughing by placing a pillow or rolled up towels firmly against it. Once you are able to get out of bed, walk around indoors and cough well. You may stop using the incentive spirometer when instructed by your caregiver.  RISKS AND COMPLICATIONS  Take your time so you do not get dizzy or light-headed.  If you are in pain, you may need to take or ask for pain medication before doing incentive spirometry. It is harder to take a deep breath if you are having pain. AFTER USE  Rest and breathe slowly and easily.  It can be helpful to keep track of a log of your progress. Your caregiver can provide you with a simple table to help with this. If you are using the spirometer at home, follow these instructions: [place] IF:   You are having difficultly using the spirometer.  You have trouble using the spirometer as often as instructed.  Your pain medication is not giving enough relief while using the spirometer.  You develop fever of 100.5 F (38.1 C) or higher. SEEK IMMEDIATE MEDICAL CARE IF:   You cough up bloody sputum that had not been present before.  You develop fever of 102 F (38.9 C) or greater.  You develop worsening pain at or near the incision site. MAKE SURE YOU:   Understand these instructions.  Will watch your condition.  Will get help right away if you are not doing well or get worse. Document Released: 01/02/2007 Document Revised: 11/14/2011 Document Reviewed: 03/05/2007 ExitCare Patient Information 2014 Exit[employer], [place].   ________________________________________________________________________  WHAT IS A BLOOD TRANSFUSION? Blood Transfusion Information  A transfusion is the replacement of blood or some of its parts.  Blood is made up of multiple cells which provide different functions.  Red blood cells carry oxygen and are used for blood loss replacement.  White blood cells fight against infection.  Platelets control bleeding.  Plasma helps clot blood.  Other blood products are available for specialized needs, such as hemophilia or other clotting disorders. BEFORE THE TRANSFUSION  Who gives blood for transfusions?   Healthy volunteers who are fully evaluated to make sure their blood is safe. This is blood bank blood. Transfusion therapy is the safest it has ever been in the practice of medicine. Before blood is taken from a donor, a complete history is taken to make sure that person has no history of diseases nor engages in risky social behavior (examples are intravenous drug use or sexual activity with multiple partners). The donor's travel history is screened to minimize risk of transmitting infections, such as malaria. The donated blood is tested for signs of infectious diseases, such as HIV and hepatitis. The blood is then tested to be sure it is compatible with you in order to minimize the chance of a transfusion reaction. If you or a relative donates blood, this is  often done in anticipation of surgery and is not appropriate for emergency situations. It takes many days to process the donated blood. RISKS AND COMPLICATIONS Although transfusion therapy is very safe and saves many lives, the main dangers of transfusion include:   Getting an infectious disease.  Developing a transfusion reaction. This is an allergic reaction to something in the blood you were given. Every precaution is taken to prevent this. The decision to have a blood transfusion has been considered carefully by your caregiver before blood is given. Blood is not given unless the benefits outweigh the risks. AFTER THE TRANSFUSION  Right after receiving a blood transfusion, you will usually feel much better and more energetic. This is  especially true if your red blood cells have gotten low (anemic). The transfusion raises the level of the red blood cells which carry oxygen, and this usually causes an energy increase.  The nurse administering the transfusion will monitor you carefully for complications. HOME CARE INSTRUCTIONS  No special instructions are needed after a transfusion. You may find your energy is better. Speak with your caregiver about any limitations on activity for underlying diseases you may have. SEEK MEDICAL CARE IF:   Your condition is not improving after your transfusion.  You develop redness or irritation at the intravenous (IV) site. SEEK IMMEDIATE MEDICAL CARE IF:  Any of the following symptoms occur over the next 12 hours:  Shaking chills.  You have a temperature by mouth above 102 F (38.9 C), not controlled by medicine.  Chest, back, or muscle pain.  People around you feel you are not acting correctly or are confused.  Shortness of breath or difficulty breathing.  Dizziness and fainting.  You get a rash or develop hives.  You have a decrease in urine output.  Your urine turns a dark color or changes to pink, red, or brown. Any of the following symptoms occur over the next 10 days:  You have a temperature by mouth above 102 F (38.9 C), not controlled by medicine.  Shortness of breath.  Weakness after normal activity.  The white part of the eye turns yellow (jaundice).  You have a decrease in the amount of urine or are urinating less often.  Your urine turns a dark color or changes to pink, red, or brown. Document Released: 08/19/2000 Document Revised: 11/14/2011 Document Reviewed: 04/07/2008 [facility] Patient Information 2014 [place], [place].  _______________________________________________________________________

## 2020-10-08 NOTE — Progress Notes (Signed)
PCR results 10/08/20 faxed to Dr. Erlinda Hong via epic

## 2020-10-08 NOTE — Progress Notes (Signed)
COVID Vaccine Completed: Yes Date COVID Vaccine completed: 12/26/19, 01/18/20, 07/03/20 COVID vaccine manufacturer: [place]     PCP - Libby Maw, MD last office visit 07/07/20 in epic Cardiologist -  N/A  Chest x-ray - 01/24/20 in epic EKG - 12/27/19 in epic Stress Test - greater 2 years ago ECHO -  N/A Cardiac Cath -  N/A Pacemaker/ICD device last checked: N/A  Sleep Study - 01/15/19 in epic CPAP -  N/A  Fasting Blood Sugar - 100-126 Checks Blood Sugar ___1__ times a day  Blood Thinner Instructions: COVID Vaccine Completed: Date COVID Vaccine completed: COVID vaccine manufacturer: [place]   PCP - Libby Maw, MD last office visit 07/07/20 in epic Cardiologist -  N/A  Chest x-ray - 01/24/20 in epic EKG - 12/27/19 in epic Stress Test - greater 2 years ago ECHO -  N/A Cardiac Cath -  N/A Pacemaker/ICD device last checked: N/A  Sleep Study - 01/15/19 in epic CPAP -  N/A  Fasting Blood Sugar - 100-126 Checks Blood Sugar ___1__ times a day  Blood Thinner Instructions: N/A Aspirin Instructions: No Instucted to call Dr. Phoebe Sharps office Last Dose: 10/07/20  Activity level:   Unable to go up a flight of stairs without symptoms                         Can go up a flight of stairs without stopping and without symptoms                         Able to exercise without symptoms                           Anesthesia review: N/A  Patient denies shortness of breath, fever, cough and chest pain at PAT appointment   Patient verbalized understanding of instructions that were given to them at the PAT appointment. Patient was also instructed that they will need to review over the PAT instructions again at home before surgery.

## 2020-10-08 NOTE — Progress Notes (Signed)
COVID Vaccine Completed: Date COVID Vaccine completed: COVID vaccine manufacturer: [place]   PCP - Libby Maw, MD last office visit 07/07/20 in epic Cardiologist -   Chest x-ray - 01/24/20 in epic EKG - 12/27/19 in epic Stress Test -  ECHO -  Cardiac Cath -  Pacemaker/ICD device last checked:  Sleep Study - 01/15/19 in epic CPAP -   Fasting Blood Sugar -  Checks Blood Sugar _____ times a day  Blood Thinner Instructions: Aspirin Instructions: Last Dose:  Activity level:  Unable to go up a flight of stairs without symptoms   Can go up a flight of stairs without stopping and without symptoms   Able to exercise without symptoms     Anesthesia review:   Patient denies shortness of breath, fever, cough and chest pain at PAT appointment   Patient verbalized understanding of instructions that were given to them at the PAT appointment. Patient was also instructed that they will need to review over the PAT instructions again at home before surgery.

## 2020-10-09 DIAGNOSIS — Z6841 Body Mass Index (BMI) 40.0 and over, adult: Secondary | ICD-10-CM | POA: Diagnosis not present

## 2020-10-10 ENCOUNTER — Other Ambulatory Visit (HOSPITAL_COMMUNITY): Payer: PPO

## 2020-10-11 ENCOUNTER — Other Ambulatory Visit: Payer: Self-pay | Admitting: Physician Assistant

## 2020-10-11 MED ORDER — TRANEXAMIC ACID 1000 MG/10ML IV SOLN
2000.0000 mg | INTRAVENOUS | Status: DC
  Filled 2020-10-11: qty 20

## 2020-10-11 MED ORDER — VANCOMYCIN HCL 1500 MG/300ML IV SOLN
1500.0000 mg | INTRAVENOUS | Status: AC
  Administered 2020-10-12: 1500 mg via INTRAVENOUS
  Filled 2020-10-11: qty 300

## 2020-10-11 MED ORDER — BUPIVACAINE LIPOSOME 1.3 % IJ SUSP
20.0000 mL | Freq: Once | INTRAMUSCULAR | Status: DC
  Filled 2020-10-11: qty 20

## 2020-10-11 NOTE — Anesthesia Preprocedure Evaluation (Addendum)
Anesthesia Evaluation  Patient identified by MRN, date of birth, ID band Patient awake    Reviewed: Allergy & Precautions, NPO status , Patient's Chart, lab work & pertinent test results  Airway Mallampati: II  TM Distance: >3 FB Neck ROM: Full    Dental  (+) Dental Advisory Given, Teeth Intact   Pulmonary sleep apnea , pneumonia, Current Smoker and Patient abstained from smoking.,    Pulmonary exam normal breath sounds clear to auscultation       Cardiovascular hypertension, Pt. on medications Normal cardiovascular exam Rhythm:Regular Rate:Normal     Neuro/Psych  Headaches, PSYCHIATRIC DISORDERS Anxiety Depression Bipolar Disorder  Neuromuscular disease    GI/Hepatic GERD  ,(+) Hepatitis -  Endo/Other  diabetesHypothyroidism   Renal/GU Renal disease     Musculoskeletal  (+) Arthritis ,   Abdominal (+) + obese,   Peds  Hematology  (+) Blood dyscrasia, anemia ,   Anesthesia Other Findings   Reproductive/Obstetrics                            Anesthesia Physical Anesthesia Plan  ASA: III  Anesthesia Plan: Spinal   Post-op Pain Management:    Induction: Intravenous  PONV Risk Score and Plan: 2 and Ondansetron, Dexamethasone, Propofol infusion, Treatment may vary due to age or medical condition and Midazolam  Airway Management Planned: Natural Airway  Additional Equipment: None  Intra-op Plan:   Post-operative Plan:   Informed Consent: I have reviewed the patients History and Physical, chart, labs and discussed the procedure including the risks, benefits and alternatives for the proposed anesthesia with the patient or authorized representative who has indicated his/her understanding and acceptance.     Dental advisory given  Plan Discussed with: CRNA  Anesthesia Plan Comments:        Anesthesia Quick Evaluation

## 2020-10-12 ENCOUNTER — Other Ambulatory Visit: Payer: Self-pay

## 2020-10-12 ENCOUNTER — Ambulatory Visit (HOSPITAL_COMMUNITY): Payer: PPO

## 2020-10-12 ENCOUNTER — Observation Stay (HOSPITAL_COMMUNITY)
Admission: RE | Admit: 2020-10-12 | Discharge: 2020-10-13 | Disposition: A | Discharge status: Home / Self Care | Payer: PPO | Attending: Orthopaedic Surgery | Admitting: Orthopaedic Surgery

## 2020-10-12 ENCOUNTER — Ambulatory Visit (HOSPITAL_COMMUNITY): Payer: PPO | Admitting: Anesthesiology

## 2020-10-12 ENCOUNTER — Encounter (HOSPITAL_COMMUNITY)
Admission: RE | Disposition: A | Discharge status: Home / Self Care | Payer: Self-pay | Source: Home / Self Care | Attending: Orthopaedic Surgery

## 2020-10-12 ENCOUNTER — Encounter (HOSPITAL_COMMUNITY): Payer: Self-pay | Admitting: Orthopaedic Surgery

## 2020-10-12 ENCOUNTER — Observation Stay (HOSPITAL_COMMUNITY): Payer: PPO

## 2020-10-12 DIAGNOSIS — E1165 Type 2 diabetes mellitus with hyperglycemia: Secondary | ICD-10-CM | POA: Diagnosis not present

## 2020-10-12 DIAGNOSIS — I10 Essential (primary) hypertension: Secondary | ICD-10-CM | POA: Insufficient documentation

## 2020-10-12 DIAGNOSIS — Z96649 Presence of unspecified artificial hip joint: Secondary | ICD-10-CM

## 2020-10-12 DIAGNOSIS — Z419 Encounter for procedure for purposes other than remedying health state, unspecified: Secondary | ICD-10-CM

## 2020-10-12 DIAGNOSIS — Z79899 Other long term (current) drug therapy: Secondary | ICD-10-CM | POA: Insufficient documentation

## 2020-10-12 DIAGNOSIS — F419 Anxiety disorder, unspecified: Secondary | ICD-10-CM | POA: Insufficient documentation

## 2020-10-12 DIAGNOSIS — M1612 Unilateral primary osteoarthritis, left hip: Secondary | ICD-10-CM | POA: Diagnosis not present

## 2020-10-12 DIAGNOSIS — Z96642 Presence of left artificial hip joint: Secondary | ICD-10-CM | POA: Diagnosis not present

## 2020-10-12 DIAGNOSIS — Z471 Aftercare following joint replacement surgery: Secondary | ICD-10-CM | POA: Diagnosis not present

## 2020-10-12 DIAGNOSIS — Z7984 Long term (current) use of oral hypoglycemic drugs: Secondary | ICD-10-CM | POA: Diagnosis not present

## 2020-10-12 DIAGNOSIS — Z791 Long term (current) use of non-steroidal anti-inflammatories (NSAID): Secondary | ICD-10-CM | POA: Insufficient documentation

## 2020-10-12 DIAGNOSIS — G4700 Insomnia, unspecified: Secondary | ICD-10-CM | POA: Diagnosis not present

## 2020-10-12 DIAGNOSIS — E119 Type 2 diabetes mellitus without complications: Secondary | ICD-10-CM | POA: Insufficient documentation

## 2020-10-12 DIAGNOSIS — F17200 Nicotine dependence, unspecified, uncomplicated: Secondary | ICD-10-CM | POA: Diagnosis not present

## 2020-10-12 DIAGNOSIS — E559 Vitamin D deficiency, unspecified: Secondary | ICD-10-CM | POA: Diagnosis not present

## 2020-10-12 HISTORY — PX: TOTAL HIP ARTHROPLASTY: SHX124

## 2020-10-12 HISTORY — DX: Type 2 diabetes mellitus without complications: E11.9

## 2020-10-12 HISTORY — DX: Obesity, unspecified: E66.9

## 2020-10-12 HISTORY — DX: Essential (primary) hypertension: I10

## 2020-10-12 LAB — URINALYSIS, ROUTINE W REFLEX MICROSCOPIC
Bilirubin Urine: NEGATIVE
Glucose, UA: NEGATIVE mg/dL
Ketones, ur: NEGATIVE mg/dL
Leukocytes,Ua: NEGATIVE
Nitrite: NEGATIVE
Protein, ur: NEGATIVE mg/dL
Specific Gravity, Urine: 1.012 (ref 1.005–1.030)
pH: 5 (ref 5.0–8.0)

## 2020-10-12 LAB — GLUCOSE, CAPILLARY
Glucose-Capillary: 101 mg/dL — ABNORMAL HIGH (ref 70–99)
Glucose-Capillary: 92 mg/dL (ref 70–99)
Glucose-Capillary: 150 mg/dL — ABNORMAL HIGH (ref 70–99)
Glucose-Capillary: 135 mg/dL — ABNORMAL HIGH (ref 70–99)
Glucose-Capillary: 101 mg/dL — ABNORMAL HIGH (ref 70–99)

## 2020-10-12 LAB — TYPE AND SCREEN
ABO/RH(D): O POS
Antibody Screen: NEGATIVE

## 2020-10-12 LAB — PREGNANCY, URINE: Preg Test, Ur: NEGATIVE

## 2020-10-12 SURGERY — ARTHROPLASTY, HIP, TOTAL, ANTERIOR APPROACH
Anesthesia: Spinal | Site: Hip | Laterality: Left

## 2020-10-12 MED ORDER — VANCOMYCIN HCL 1000 MG IV SOLR
INTRAVENOUS | Status: AC
  Filled 2020-10-12: qty 1000

## 2020-10-12 MED ORDER — METHOCARBAMOL 500 MG PO TABS: 500.0000 mg | ORAL_TABLET | Freq: Four times a day (QID) | ORAL | Status: DC | PRN

## 2020-10-12 MED ORDER — PHENYLEPHRINE 40 MCG/ML (10ML) SYRINGE FOR IV PUSH (FOR BLOOD PRESSURE SUPPORT)
PREFILLED_SYRINGE | INTRAVENOUS | Status: DC | PRN
  Administered 2020-10-12 (×5): 80 ug via INTRAVENOUS

## 2020-10-12 MED ORDER — LACTATED RINGERS IV SOLN: INTRAVENOUS | Status: DC

## 2020-10-12 MED ORDER — PHENOL 1.4 % MT LIQD: 1.0000 | OROMUCOSAL | Status: DC | PRN

## 2020-10-12 MED ORDER — DIPHENHYDRAMINE HCL 12.5 MG/5ML PO ELIX: 25.0000 mg | ORAL_SOLUTION | ORAL | Status: DC | PRN

## 2020-10-12 MED ORDER — OXYCODONE HCL 5 MG PO TABS: 5.0000 mg | ORAL_TABLET | ORAL | Status: DC | PRN

## 2020-10-12 MED ORDER — TRANEXAMIC ACID-NACL 1000-0.7 MG/100ML-% IV SOLN: 1000.0000 mg | Freq: Once | INTRAVENOUS | Status: DC

## 2020-10-12 MED ORDER — OXYCODONE HCL 5 MG PO TABS
10.0000 mg | ORAL_TABLET | ORAL | Status: DC | PRN
  Administered 2020-10-12 (×2): 15 mg via ORAL
  Filled 2020-10-12 (×2): qty 3

## 2020-10-12 MED ORDER — METHOCARBAMOL 500 MG IVPB - SIMPLE MED
500.0000 mg | Freq: Four times a day (QID) | INTRAVENOUS | Status: DC | PRN
  Filled 2020-10-12: qty 50

## 2020-10-12 MED ORDER — MAGNESIUM CITRATE PO SOLN: 1.0000 | Freq: Once | ORAL | Status: DC | PRN

## 2020-10-12 MED ORDER — BUPIVACAINE LIPOSOME 1.3 % IJ SUSP
INTRAMUSCULAR | Status: DC | PRN
  Administered 2020-10-12: 20 mL

## 2020-10-12 MED ORDER — ONDANSETRON HCL 4 MG/2ML IJ SOLN
INTRAMUSCULAR | Status: DC | PRN
  Administered 2020-10-12: 4 mg via INTRAVENOUS

## 2020-10-12 MED ORDER — ACETAMINOPHEN 325 MG PO TABS: 325.0000 mg | ORAL_TABLET | Freq: Four times a day (QID) | ORAL | Status: DC | PRN

## 2020-10-12 MED ORDER — ONDANSETRON HCL 4 MG/2ML IJ SOLN
INTRAMUSCULAR | Status: AC
  Filled 2020-10-12: qty 2

## 2020-10-12 MED ORDER — ACETAMINOPHEN 500 MG PO TABS
1000.0000 mg | ORAL_TABLET | Freq: Four times a day (QID) | ORAL | Status: AC
  Administered 2020-10-12 – 2020-10-13 (×4): 1000 mg via ORAL
  Filled 2020-10-12 (×5): qty 2

## 2020-10-12 MED ORDER — DEXAMETHASONE SODIUM PHOSPHATE 10 MG/ML IJ SOLN
INTRAMUSCULAR | Status: DC | PRN
  Administered 2020-10-12: 5 mg via INTRAVENOUS

## 2020-10-12 MED ORDER — FENTANYL CITRATE (PF) 100 MCG/2ML IJ SOLN
INTRAMUSCULAR | Status: DC | PRN
  Administered 2020-10-12 (×2): 50 ug via INTRAVENOUS

## 2020-10-12 MED ORDER — INSULIN ASPART 100 UNIT/ML ~~LOC~~ SOLN: 0.0000 [IU] | Freq: Three times a day (TID) | SUBCUTANEOUS | Status: DC

## 2020-10-12 MED ORDER — SODIUM CHLORIDE 0.9 % IR SOLN
Status: DC | PRN
  Administered 2020-10-12: 1000 mL

## 2020-10-12 MED ORDER — INSULIN ASPART 100 UNIT/ML ~~LOC~~ SOLN: 0.0000 [IU] | Freq: Every day | SUBCUTANEOUS | Status: DC

## 2020-10-12 MED ORDER — BUPIVACAINE-EPINEPHRINE (PF) 0.25% -1:200000 IJ SOLN
INTRAMUSCULAR | Status: AC
  Filled 2020-10-12: qty 30

## 2020-10-12 MED ORDER — POLYETHYLENE GLYCOL 3350 17 G PO PACK: 17.0000 g | PACK | Freq: Every day | ORAL | Status: DC

## 2020-10-12 MED ORDER — POVIDONE-IODINE 10 % EX SWAB
2.0000 "application " | Freq: Once | CUTANEOUS | Status: AC
  Administered 2020-10-12: 2 via TOPICAL

## 2020-10-12 MED ORDER — ONDANSETRON HCL 4 MG PO TABS: 4.0000 mg | ORAL_TABLET | Freq: Four times a day (QID) | ORAL | Status: DC | PRN

## 2020-10-12 MED ORDER — VANCOMYCIN HCL 1 G IV SOLR
INTRAVENOUS | Status: DC | PRN
  Administered 2020-10-12: 1000 mg via TOPICAL

## 2020-10-12 MED ORDER — SORBITOL 70 % SOLN
30.0000 mL | Freq: Every day | Status: DC | PRN
  Filled 2020-10-12: qty 30

## 2020-10-12 MED ORDER — LEVOTHYROXINE SODIUM 75 MCG PO TABS
75.0000 ug | ORAL_TABLET | Freq: Every day | ORAL | Status: DC
  Administered 2020-10-13: 75 ug via ORAL
  Filled 2020-10-12: qty 1

## 2020-10-12 MED ORDER — SODIUM CHLORIDE (PF) 0.9 % IJ SOLN
INTRAMUSCULAR | Status: DC | PRN
  Administered 2020-10-12 (×2): 10 mL

## 2020-10-12 MED ORDER — BUPIVACAINE-EPINEPHRINE 0.25% -1:200000 IJ SOLN
INTRAMUSCULAR | Status: DC | PRN
  Administered 2020-10-12: 20 mL

## 2020-10-12 MED ORDER — METOCLOPRAMIDE HCL 5 MG/ML IJ SOLN: 5.0000 mg | Freq: Three times a day (TID) | INTRAMUSCULAR | Status: DC | PRN

## 2020-10-12 MED ORDER — LISINOPRIL 10 MG PO TABS
10.0000 mg | ORAL_TABLET | Freq: Every day | ORAL | Status: DC
Start: 2020-10-13 — End: 2020-10-13
  Administered 2020-10-13: 10 mg via ORAL
  Filled 2020-10-12: qty 1

## 2020-10-12 MED ORDER — OXYCODONE HCL ER 10 MG PO T12A: 10.0000 mg | EXTENDED_RELEASE_TABLET | Freq: Two times a day (BID) | ORAL | Status: DC

## 2020-10-12 MED ORDER — ALPRAZOLAM 0.5 MG PO TABS
0.5000 mg | ORAL_TABLET | Freq: Two times a day (BID) | ORAL | Status: DC | PRN
  Administered 2020-10-12: 0.5 mg via ORAL
  Filled 2020-10-12: qty 1

## 2020-10-12 MED ORDER — HYDROMORPHONE HCL 1 MG/ML IJ SOLN
INTRAMUSCULAR | Status: AC
  Filled 2020-10-12: qty 1

## 2020-10-12 MED ORDER — KETOROLAC TROMETHAMINE 15 MG/ML IJ SOLN
15.0000 mg | Freq: Four times a day (QID) | INTRAMUSCULAR | Status: AC
  Administered 2020-10-12 – 2020-10-13 (×4): 15 mg via INTRAVENOUS
  Filled 2020-10-12 (×4): qty 1

## 2020-10-12 MED ORDER — PROPOFOL 500 MG/50ML IV EMUL
INTRAVENOUS | Status: AC
  Filled 2020-10-12: qty 50

## 2020-10-12 MED ORDER — MIDAZOLAM HCL 2 MG/2ML IJ SOLN
INTRAMUSCULAR | Status: DC | PRN
  Administered 2020-10-12: 2 mg via INTRAVENOUS

## 2020-10-12 MED ORDER — METHOCARBAMOL 500 MG PO TABS
500.0000 mg | ORAL_TABLET | Freq: Four times a day (QID) | ORAL | Status: DC | PRN
  Administered 2020-10-12 – 2020-10-13 (×5): 500 mg via ORAL
  Filled 2020-10-12 (×5): qty 1

## 2020-10-12 MED ORDER — LIDOCAINE HCL (PF) 2 % IJ SOLN
INTRAMUSCULAR | Status: AC
  Filled 2020-10-12: qty 5

## 2020-10-12 MED ORDER — FENTANYL CITRATE (PF) 100 MCG/2ML IJ SOLN
INTRAMUSCULAR | Status: AC
  Filled 2020-10-12: qty 2

## 2020-10-12 MED ORDER — PROPOFOL 1000 MG/100ML IV EMUL
INTRAVENOUS | Status: AC
  Filled 2020-10-12: qty 100

## 2020-10-12 MED ORDER — ISOPROPYL ALCOHOL 70 % SOLN
Status: AC
  Filled 2020-10-12: qty 480

## 2020-10-12 MED ORDER — TRANEXAMIC ACID-NACL 1000-0.7 MG/100ML-% IV SOLN
1000.0000 mg | INTRAVENOUS | Status: AC
  Administered 2020-10-12: 1000 mg via INTRAVENOUS
  Filled 2020-10-12: qty 100

## 2020-10-12 MED ORDER — MEPERIDINE HCL 50 MG/ML IJ SOLN: 6.2500 mg | INTRAMUSCULAR | Status: DC | PRN

## 2020-10-12 MED ORDER — INSULIN ASPART 100 UNIT/ML ~~LOC~~ SOLN
0.0000 [IU] | Freq: Three times a day (TID) | SUBCUTANEOUS | Status: DC
  Administered 2020-10-12 (×2): 3 [IU] via SUBCUTANEOUS

## 2020-10-12 MED ORDER — POLYETHYLENE GLYCOL 3350 17 G PO PACK
17.0000 g | PACK | Freq: Every day | ORAL | Status: DC
  Administered 2020-10-12 – 2020-10-13 (×2): 17 g via ORAL
  Filled 2020-10-12 (×2): qty 1

## 2020-10-12 MED ORDER — METHOCARBAMOL 1000 MG/10ML IJ SOLN
500.0000 mg | Freq: Four times a day (QID) | INTRAVENOUS | Status: DC | PRN
  Filled 2020-10-12: qty 5

## 2020-10-12 MED ORDER — SEMAGLUTIDE(0.25 OR 0.5MG/DOS) 2 MG/1.5ML ~~LOC~~ SOPN: 0.2500 mg | PEN_INJECTOR | SUBCUTANEOUS | Status: DC

## 2020-10-12 MED ORDER — METHYLPHENIDATE HCL 10 MG PO TABS
20.0000 mg | ORAL_TABLET | Freq: Two times a day (BID) | ORAL | Status: DC
  Administered 2020-10-13: 20 mg via ORAL
  Filled 2020-10-12 (×2): qty 2

## 2020-10-12 MED ORDER — SODIUM CHLORIDE 0.9 % IV SOLN: INTRAVENOUS | Status: DC

## 2020-10-12 MED ORDER — LIDOCAINE HCL (CARDIAC) PF 100 MG/5ML IV SOSY
PREFILLED_SYRINGE | INTRAVENOUS | Status: DC | PRN
  Administered 2020-10-12: 40 mg via INTRAVENOUS

## 2020-10-12 MED ORDER — ACETAMINOPHEN 500 MG PO TABS: 1000.0000 mg | ORAL_TABLET | Freq: Four times a day (QID) | ORAL | Status: DC

## 2020-10-12 MED ORDER — CEFAZOLIN SODIUM-DEXTROSE 2-4 GM/100ML-% IV SOLN
2.0000 g | INTRAVENOUS | Status: AC
  Administered 2020-10-12: 2 g via INTRAVENOUS
  Filled 2020-10-12: qty 100

## 2020-10-12 MED ORDER — METOCLOPRAMIDE HCL 5 MG PO TABS: 5.0000 mg | ORAL_TABLET | Freq: Three times a day (TID) | ORAL | Status: DC | PRN

## 2020-10-12 MED ORDER — ASPIRIN 81 MG PO CHEW
81.0000 mg | CHEWABLE_TABLET | Freq: Two times a day (BID) | ORAL | Status: DC
  Administered 2020-10-12 – 2020-10-13 (×2): 81 mg via ORAL
  Filled 2020-10-12 (×2): qty 1

## 2020-10-12 MED ORDER — ONDANSETRON HCL 4 MG/2ML IJ SOLN: 4.0000 mg | Freq: Four times a day (QID) | INTRAMUSCULAR | Status: DC | PRN

## 2020-10-12 MED ORDER — TRANEXAMIC ACID 1000 MG/10ML IV SOLN
INTRAVENOUS | Status: DC | PRN
  Administered 2020-10-12: 2000 mg via TOPICAL

## 2020-10-12 MED ORDER — DOCUSATE SODIUM 100 MG PO CAPS: 100.0000 mg | ORAL_CAPSULE | Freq: Two times a day (BID) | ORAL | Status: DC

## 2020-10-12 MED ORDER — CHLORHEXIDINE GLUCONATE 0.12 % MT SOLN
15.0000 mL | Freq: Once | OROMUCOSAL | Status: AC
  Administered 2020-10-12: 15 mL via OROMUCOSAL

## 2020-10-12 MED ORDER — ARIPIPRAZOLE 5 MG PO TABS
5.0000 mg | ORAL_TABLET | Freq: Every day | ORAL | Status: DC
  Administered 2020-10-12 – 2020-10-13 (×2): 5 mg via ORAL
  Filled 2020-10-12 (×2): qty 1

## 2020-10-12 MED ORDER — ALUM & MAG HYDROXIDE-SIMETH 200-200-20 MG/5ML PO SUSP: 30.0000 mL | ORAL | Status: DC | PRN

## 2020-10-12 MED ORDER — KETOROLAC TROMETHAMINE 15 MG/ML IJ SOLN: 15.0000 mg | Freq: Four times a day (QID) | INTRAMUSCULAR | Status: DC | PRN

## 2020-10-12 MED ORDER — DEXAMETHASONE SODIUM PHOSPHATE 10 MG/ML IJ SOLN
10.0000 mg | Freq: Once | INTRAMUSCULAR | Status: AC
  Administered 2020-10-13: 10 mg via INTRAVENOUS
  Filled 2020-10-12: qty 1

## 2020-10-12 MED ORDER — PHENYLEPHRINE 40 MCG/ML (10ML) SYRINGE FOR IV PUSH (FOR BLOOD PRESSURE SUPPORT)
PREFILLED_SYRINGE | INTRAVENOUS | Status: AC
  Filled 2020-10-12: qty 10

## 2020-10-12 MED ORDER — BUPIVACAINE IN DEXTROSE 0.75-8.25 % IT SOLN
INTRATHECAL | Status: DC | PRN
  Administered 2020-10-12: 1.6 mL via INTRATHECAL

## 2020-10-12 MED ORDER — DEXAMETHASONE SODIUM PHOSPHATE 10 MG/ML IJ SOLN: 10.0000 mg | Freq: Once | INTRAMUSCULAR | Status: DC

## 2020-10-12 MED ORDER — MIDAZOLAM HCL 2 MG/2ML IJ SOLN
INTRAMUSCULAR | Status: AC
  Filled 2020-10-12: qty 2

## 2020-10-12 MED ORDER — DOCUSATE SODIUM 100 MG PO CAPS
100.0000 mg | ORAL_CAPSULE | Freq: Two times a day (BID) | ORAL | Status: DC
  Administered 2020-10-12 – 2020-10-13 (×2): 100 mg via ORAL
  Filled 2020-10-12 (×2): qty 1

## 2020-10-12 MED ORDER — PROPOFOL 10 MG/ML IV BOLUS
INTRAVENOUS | Status: DC | PRN
  Administered 2020-10-12 (×2): 20 mg via INTRAVENOUS

## 2020-10-12 MED ORDER — HYDROMORPHONE HCL 1 MG/ML IJ SOLN: 0.5000 mg | INTRAMUSCULAR | Status: DC | PRN

## 2020-10-12 MED ORDER — LAMOTRIGINE 25 MG PO TABS
150.0000 mg | ORAL_TABLET | Freq: Two times a day (BID) | ORAL | Status: DC
  Administered 2020-10-12 – 2020-10-13 (×2): 150 mg via ORAL
  Filled 2020-10-12 (×2): qty 1

## 2020-10-12 MED ORDER — SODIUM CHLORIDE (PF) 0.9 % IJ SOLN
INTRAMUSCULAR | Status: AC
  Filled 2020-10-12: qty 20

## 2020-10-12 MED ORDER — SORBITOL 70 % SOLN: 30.0000 mL | Freq: Every day | Status: DC | PRN

## 2020-10-12 MED ORDER — DEXAMETHASONE SODIUM PHOSPHATE 10 MG/ML IJ SOLN
INTRAMUSCULAR | Status: AC
  Filled 2020-10-12: qty 1

## 2020-10-12 MED ORDER — ASPIRIN 81 MG PO CHEW: 81.0000 mg | CHEWABLE_TABLET | Freq: Two times a day (BID) | ORAL | Status: DC

## 2020-10-12 MED ORDER — PROPOFOL 500 MG/50ML IV EMUL
INTRAVENOUS | Status: DC | PRN
  Administered 2020-10-12: 100 ug/kg/min via INTRAVENOUS

## 2020-10-12 MED ORDER — HYDROMORPHONE HCL 1 MG/ML IJ SOLN
0.2500 mg | INTRAMUSCULAR | Status: DC | PRN
  Administered 2020-10-12 (×2): 0.5 mg via INTRAVENOUS

## 2020-10-12 MED ORDER — MENTHOL 3 MG MT LOZG: 1.0000 | LOZENGE | OROMUCOSAL | Status: DC | PRN

## 2020-10-12 MED ORDER — ORAL CARE MOUTH RINSE: 15.0000 mL | Freq: Once | OROMUCOSAL | Status: AC

## 2020-10-12 MED ORDER — OXYCODONE HCL 5 MG PO TABS
10.0000 mg | ORAL_TABLET | ORAL | Status: DC | PRN
Start: 2020-10-12 — End: 2020-10-13
  Administered 2020-10-12: 15 mg via ORAL
  Administered 2020-10-13 (×2): 10 mg via ORAL
  Administered 2020-10-13: 15 mg via ORAL
  Filled 2020-10-12 (×2): qty 3
  Filled 2020-10-12 (×2): qty 2

## 2020-10-12 MED ORDER — ALUM & MAG HYDROXIDE-SIMETH 200-200-20 MG/5ML PO SUSP
30.0000 mL | ORAL | Status: DC | PRN
  Administered 2020-10-13: 30 mL via ORAL
  Filled 2020-10-12: qty 30

## 2020-10-12 MED ORDER — VANCOMYCIN HCL IN DEXTROSE 1-5 GM/200ML-% IV SOLN
1000.0000 mg | Freq: Two times a day (BID) | INTRAVENOUS | Status: AC
  Administered 2020-10-12 – 2020-10-13 (×2): 1000 mg via INTRAVENOUS
  Filled 2020-10-12 (×2): qty 200

## 2020-10-12 MED ORDER — OXYCODONE HCL ER 10 MG PO T12A
10.0000 mg | EXTENDED_RELEASE_TABLET | Freq: Two times a day (BID) | ORAL | Status: DC
  Administered 2020-10-12 – 2020-10-13 (×2): 10 mg via ORAL
  Filled 2020-10-12 (×2): qty 1

## 2020-10-12 MED ORDER — GABAPENTIN 300 MG PO CAPS
300.0000 mg | ORAL_CAPSULE | Freq: Three times a day (TID) | ORAL | Status: DC
  Administered 2020-10-12 – 2020-10-13 (×4): 300 mg via ORAL
  Filled 2020-10-12 (×4): qty 1

## 2020-10-12 MED ORDER — PROMETHAZINE HCL 25 MG/ML IJ SOLN: 6.2500 mg | INTRAMUSCULAR | Status: DC | PRN

## 2020-10-12 MED ORDER — 0.9 % SODIUM CHLORIDE (POUR BTL) OPTIME
TOPICAL | Status: DC | PRN
  Administered 2020-10-12: 1000 mL

## 2020-10-12 MED ORDER — METFORMIN HCL 500 MG PO TABS
500.0000 mg | ORAL_TABLET | Freq: Two times a day (BID) | ORAL | Status: DC
  Administered 2020-10-12 – 2020-10-13 (×2): 500 mg via ORAL
  Filled 2020-10-12 (×2): qty 1

## 2020-10-12 MED ORDER — TRANEXAMIC ACID-NACL 1000-0.7 MG/100ML-% IV SOLN
1000.0000 mg | Freq: Once | INTRAVENOUS | Status: AC
  Administered 2020-10-12: 1000 mg via INTRAVENOUS
  Filled 2020-10-12: qty 100

## 2020-10-12 MED ORDER — HYDROMORPHONE HCL 1 MG/ML IJ SOLN
0.5000 mg | INTRAMUSCULAR | Status: DC | PRN
Start: 2020-10-12 — End: 2020-10-12

## 2020-10-12 SURGICAL SUPPLY — 53 items
ARTICU/EZE FEM HEAD 022.225 ×2 IMPLANT
BAG ZIPLOCK 12X15 (MISCELLANEOUS) ×2 IMPLANT
CELLS DAT CNTRL 66122 CELL SVR (MISCELLANEOUS) ×1 IMPLANT
CLSR STERI-STRIP ANTIMIC 1/2X4 (GAUZE/BANDAGES/DRESSINGS) ×2 IMPLANT
COOLER ICEMAN CLASSIC (MISCELLANEOUS) ×2 IMPLANT
COVER PERINEAL POST (MISCELLANEOUS) ×2 IMPLANT
COVER SURGICAL LIGHT HANDLE (MISCELLANEOUS) ×2 IMPLANT
COVER WAND RF STERILE (DRAPES) IMPLANT
CUP SECTOR GRIPTON 50MM (Cup) ×2 IMPLANT
DERMABOND ADVANCED (GAUZE/BANDAGES/DRESSINGS) ×1
DERMABOND ADVANCED .7 DNX12 (GAUZE/BANDAGES/DRESSINGS) ×1 IMPLANT
DRAPE IMP U-DRAPE 54X76 (DRAPES) ×4 IMPLANT
DRAPE POUCH INSTRU U-SHP 10X18 (DRAPES) ×2 IMPLANT
DRAPE STERI IOBAN 125X83 (DRAPES) ×4 IMPLANT
DRAPE U-SHAPE 47X51 STRL (DRAPES) ×4 IMPLANT
DRESSING AQUACEL AG SP 3.5X10 (GAUZE/BANDAGES/DRESSINGS) ×1 IMPLANT
DRSG AQUACEL AG ADV 3.5X10 (GAUZE/BANDAGES/DRESSINGS) ×2 IMPLANT
DRSG AQUACEL AG SP 3.5X10 (GAUZE/BANDAGES/DRESSINGS) ×2
DURAPREP 26ML APPLICATOR (WOUND CARE) ×4 IMPLANT
ELECT BLADE TIP CTD 4 INCH (ELECTRODE) ×2 IMPLANT
ELECT REM PT RETURN 15FT ADLT (MISCELLANEOUS) ×2 IMPLANT
GLOVE SURG LTX SZ7 (GLOVE) ×4 IMPLANT
GLOVE SURG SYN 7.5  E (GLOVE) ×10
GLOVE SURG SYN 7.5 E (GLOVE) ×5 IMPLANT
GLOVE SURG UNDER POLY LF SZ7 (GLOVE) ×2 IMPLANT
GOWN SRG XL LVL 4 BRTHBL STRL (GOWNS) ×2 IMPLANT
GOWN STRL NON-REIN XL LVL4 (GOWNS) ×4
HANDPIECE INTERPULSE COAX TIP (DISPOSABLE) ×2
HEAD FEMORL ARTICU/EZE 022.225 ×1 IMPLANT
HOOD PEEL AWAY FLYTE STAYCOOL (MISCELLANEOUS) ×6 IMPLANT
JET LAVAGE IRRISEPT WOUND (IRRIGATION / IRRIGATOR) ×2
KIT BASIN OR (CUSTOM PROCEDURE TRAY) ×2 IMPLANT
KIT TURNOVER KIT A (KITS) IMPLANT
LAVAGE JET IRRISEPT WOUND (IRRIGATION / IRRIGATOR) ×1 IMPLANT
LINER ACET PE 43X22 (Liner) ×2 IMPLANT
LINER DM PINNACLE 50/43 (Liner) ×2 IMPLANT
MARKER SKIN DUAL TIP RULER LAB (MISCELLANEOUS) ×4 IMPLANT
NEEDLE SPNL 18GX3.5 QUINCKE PK (NEEDLE) ×2 IMPLANT
PACK ANTERIOR HIP CUSTOM (KITS) ×2 IMPLANT
PENCIL SMOKE EVACUATOR (MISCELLANEOUS) IMPLANT
RTRCTR WOUND ALEXIS 18CM MED (MISCELLANEOUS) ×2
SAW OSC TIP CART 19.5X105X1.3 (SAW) ×2 IMPLANT
SCREW 6.5MMX25MM (Screw) ×2 IMPLANT
SET HNDPC FAN SPRY TIP SCT (DISPOSABLE) ×1 IMPLANT
STEM FEM ACTIS STD SZ4 (Stem) ×2 IMPLANT
SUT ETHIBOND 2 V 37 (SUTURE) ×2 IMPLANT
SUT MNCRL AB 3-0 PS2 18 (SUTURE) IMPLANT
SUT VIC AB 0 CT1 36 (SUTURE) ×2 IMPLANT
SUT VIC AB 1 CTX 36 (SUTURE) ×2
SUT VIC AB 1 CTX36XBRD ANBCTR (SUTURE) ×1 IMPLANT
SUT VIC AB 2-0 CT1 27 (SUTURE) ×4
SUT VIC AB 2-0 CT1 TAPERPNT 27 (SUTURE) ×2 IMPLANT
TRAY FOLEY MTR SLVR 16FR STAT (SET/KITS/TRAYS/PACK) IMPLANT

## 2020-10-12 NOTE — Progress Notes (Signed)
Pharmacy Antibiotic Note  [name] is a 50 y.o. female admitted on 10/12/2020 for surgical treatment of left hip degenerative joint disease. Underwent left total hip arthroplasty today. Pharmacy has been consulted for Vancomycin dosing x 24 hours post-operatively. Patient received Cefazolin 2g IV and Vancomycin 1500mg  IV pre-operatively.   Plan: Vancomycin 1g IV q12h x 2 doses starting at 1800 today. Pharmacy will sign off at this time. Please reconsult if we can be of further assistance.    Weight: 105.7 kg (233 lb)  Temp (24hrs), Avg:97 F (36.1 C), Min:94.5 F (34.7 C), Max:98.7 F (37.1 C)  Recent Labs  Lab 10/08/20 1203  WBC 14.6*  CREATININE 0.82    Estimated Creatinine Clearance: 102.4 mL/min (by C-G formula based on SCr of 0.82 mg/dL).    Allergies  Allergen Reactions  . Pregabalin Anaphylaxis  . Celebrex [Celecoxib] Nausea And Vomiting    Stomach pain  . Effexor [Venlafaxine Hcl] Other (See Comments)    Serotonin toxicity  . Mirapex [Pramipexole]     Mirapex- Worsening "body jerks"  . Pristiq [Desvenlafaxine] Other (See Comments)    Serotonin toxicity   . Prozac [Fluoxetine Hcl] Swelling  . Symbyax [Olanzapine-Fluoxetine Hcl] Swelling  . Wellbutrin [Bupropion Hcl] Swelling    Thank you for allowing pharmacy to be a part of this patient's care.  Luiz Ochoa 10/12/2020 11:14 AM

## 2020-10-12 NOTE — Op Note (Signed)
LEFT TOTAL HIP ARTHROPLASTY ANTERIOR APPROACH  Procedure Note [name]   [id number]  Pre-op Diagnosis: LEFT HIP DEGENERATIVE JOINT DISEASE     Post-op Diagnosis: same   Operative Procedures  1. Total hip replacement; Left hip; uncemented [id number]   Surgeon: [name], M.D.  Assist: Claudia Rob, PA-C   Anesthesia: spinal, exparel local  Prosthesis: Depuy Acetabulum: Pinnacle 50 mm Femur: Actis 4 STD Head: 43/22 dual mobility mm size: +4 Liner: lateralized Bearing Type: dual mobility  Total Hip Arthroplasty (Anterior Approach) Op Note:  After informed consent was obtained and the operative extremity marked in the holding area, the patient was brought back to the operating room and placed supine on the HANA table. Next, the operative extremity was prepped and draped in normal sterile fashion. Surgical timeout occurred verifying patient identification, surgical site, surgical procedure and administration of antibiotics.  A modified anterior Smith-Peterson approach to the hip was performed, using the interval between tensor fascia lata and sartorius.  Dissection was carried bluntly down onto the anterior hip capsule. The lateral femoral circumflex vessels were identified and coagulated. A capsulotomy was performed and the capsular flaps tagged for later repair.  The neck osteotomy was performed. The femoral head was removed which demonstrated severe degenerative changes and eroded cartilage, the acetabular rim was cleared of soft tissue and calcified labrum and attention was turned to reaming the acetabulum.  Sequential reaming was performed under fluoroscopic guidance. We reamed to a size 49 mm, and then impacted the acetabular shell. A 25 mm cancellous screw was placed through the shell for added fixation.  The liner was then placed after irrigation and attention turned to the femur.  After placing the femoral hook, the leg was taken to externally rotated, extended and  adducted position taking care to perform soft tissue releases to allow for adequate mobilization of the femur. Soft tissue was cleared from the shoulder of the greater trochanter and the hook elevator used to improve exposure of the proximal femur. Sequential broaching performed up to a size 4. Trial neck and head were placed. The leg was brought back up to neutral and the construct reduced.  Antibiotic irrigation was placed in the surgical wound and kept for at least 1 minute.  The position and sizing of components, offset and leg lengths were checked using fluoroscopy. Stability of the construct was checked in extension and external rotation without any subluxation or impingement of prosthesis. We dislocated the prosthesis, dropped the leg back into position, removed trial components, and irrigated copiously. The final stem and head was then placed, the leg brought back up, the system reduced and fluoroscopy used to verify positioning.  We irrigated, obtained hemostasis and closed the capsule using #2 ethibond suture.  One gram of vancomycin powder was placed in the surgical bed. A dilute solution of 20 cc of normal saline, 1.3% exparel, 0.25% bupivacaine was injected in the soft tissues.  One gram of topical tranexamic acid was injected into the joint.  The fascia was closed with #1 vicryl plus, the deep fat layer was closed with 0 vicryl, the subcutaneous layers closed with 2.0 Vicryl Plus and the skin closed with 2.0 nylon and dermabond. A sterile dressing was applied. The patient was awakened in the operating room and taken to recovery in stable condition.  All sponge, needle, and instrument counts were correct at the end of the case.   [name], my PA, was a medical necessity for opening, closing, limb positioning, retracting,  exposing, and overall facilitation and timely completion of the surgery.  Position: supine  Complications: see description of procedure.  Time Out: performed    Drains/Packing: none  Estimated blood loss: see anesthesia record  Returned to Recovery Room: in good condition.   Antibiotics: yes   Mechanical VTE (DVT) Prophylaxis: sequential compression devices, TED thigh-high  Chemical VTE (DVT) Prophylaxis: aspirin   Fluid Replacement: see anesthesia record  Specimens Removed: 1 to pathology   Sponge and Instrument Count Correct? yes   PACU: portable radiograph - low AP   Plan/RTC: Return in 2 weeks for staple removal. Weight Bearing/Load Lower Extremity: full  Hip precautions: none Suture Removal: 2 weeks   N. Claudia Roux, MD [facility] 9:02 AM   Implant Name Type Inv. Item Serial No. Manufacturer Lot No. LRB No. Used Action  LINER DM PINNACLE 50/43 - [id number] Liner LINER DM PINNACLE 50/43  [employer] [id number] Left 1 Implanted  SCREW 6.5MMX25MM - [id number] Screw SCREW 6.5MMX25MM  [employer] [id number] Left 1 Implanted  CUP SECTOR GRIPTON 50MM - [id number] Cup CUP SECTOR GRIPTON 50MM  [employer] [id number] Left 1 Implanted  LINER ACET PE 43X22 - [id number] Liner LINER ACET PE 43X22  [employer] [id number] Left 1 Implanted  STEM FEM ACTIS STD SZ4 - [id number] Stem STEM FEM ACTIS STD SZ4  [employer] [id number] Left 1 Implanted  ARTICU/EZE FEM HEAD 022.225MM - [id number]  ARTICU/EZE FEM HEAD 022.225MM  [employer] [id number] Left 1 Implanted

## 2020-10-12 NOTE — Transfer of Care (Signed)
Immediate Anesthesia Transfer of Care Note  Patient: [name]  Procedure(s) Performed: LEFT TOTAL HIP ARTHROPLASTY ANTERIOR APPROACH (Left Hip)  Patient Location: PACU  Anesthesia Type:Spinal  Level of Consciousness: awake, alert , oriented and patient cooperative  Airway & Oxygen Therapy: Patient Spontanous Breathing and Patient connected to face mask oxygen  Post-op Assessment: Report given to RN and Post -op Vital signs reviewed and stable  Post vital signs: Reviewed and stable  Last Vitals:  Vitals Value Taken Time  BP 113/68 0925  Temp    Pulse 79   Resp    SpO2 100%     Last Pain:  Vitals:   10/12/20 0606  TempSrc: Oral  PainSc:          Complications: No complications documented.

## 2020-10-12 NOTE — H&P (Signed)
PREOPERATIVE H&P  Chief Complaint: LEFT HIP DEGENERATIVE JOINT DISEASE  HPI: [name] is a 50 y.o. female who presents for surgical treatment of LEFT HIP DEGENERATIVE JOINT DISEASE.  She denies any changes in medical history.  Past Medical History:  Diagnosis Date  . ADHD (attention deficit hyperactivity disorder)   . Anemia    with pregnancy  . Anxiety   . Arthritis   . Bipolar disorder ([place])   . Chronic back pain greater than 3 months duration   . Depression   . Diabetes mellitus without complication ([place])   . Diabetic nephropathy ([place])    bilateral feet  . Dysuria    has to have self in and out cath   . Eroded bladder suspension mesh ([place])   . GERD (gastroesophageal reflux disease)   . Hepatitis    C  . History of COVID-19 11/2019  . History of MRSA infection    after back surgery  . History of sleep apnea    resolved after weight loss  . Hypertension   . Hypothyroidism   . Leg pain, bilateral   . Obesity   . Obsessive-compulsive disorder   . Pneumonia 12/24/2019  . Rosacea   . Sciatic nerve pain   . Social anxiety disorder   . Squamous cell carcinoma of back 2021   Past Surgical History:  Procedure Laterality Date  . BACK SURGERY     total of 6 back surgeries  . bladder mesh  2009  . DILITATION & CURRETTAGE/HYSTROSCOPY WITH NOVASURE ABLATION N/A 08/19/2016   Procedure: DILATATION & CURETTAGE WITH NOVASURE ABLATION;  Surgeon: [name], MD;  Location: [facility];  Service: Gynecology;  Laterality: N/A;  . EUS N/A 09/09/2015   Procedure: UPPER ENDOSCOPIC ULTRASOUND (EUS) RADIAL;  Surgeon: [name], MD;  Location: [facility];  Service: Endoscopy;  Laterality: N/A;  . EYE SURGERY    . INCONTINENCE SURGERY    . SPINAL CORD STIMULATOR INSERTION    . SPINAL CORD STIMULATOR REMOVAL    . SPINAL FUSION  2008   times two  . TUBAL LIGATION  2006   Social History   Socioeconomic History  . Marital status: Legally Separated    Spouse name: Not  on file  . Number of children: Not on file  . Years of education: Not on file  . Highest education level: Not on file  Occupational History  . Not on file  Tobacco Use  . Smoking status: Light Tobacco Smoker    Packs/day: 1.00    Years: 10.00    Pack years: 10.00    Types: Cigarettes    Last attempt to quit: 02/09/2016    Years since quitting: 4.6  . Smokeless tobacco: Never Used  . Tobacco comment: use vapor, social  Vaping Use  . Vaping Use: Never used  Substance and Sexual Activity  . Alcohol use: No  . Drug use: No  . Sexual activity: Yes    Birth control/protection: Surgical  Other Topics Concern  . Not on file  Social History Narrative  . Not on file   Social Determinants of Health   Financial Resource Strain: Medium Risk  . Difficulty of Paying Living Expenses: Somewhat hard  Food Insecurity: Food Insecurity Present  . Worried About Charity fundraiser in the Last Year: Sometimes true  . Ran Out of Food in the Last Year: Never true  Transportation Needs: Unmet Transportation Needs  . Lack of Transportation (Medical): Yes  .  Lack of Transportation (Non-Medical): No  Physical Activity: Insufficiently Active  . Days of Exercise per Week: 3 days  . Minutes of Exercise per Session: 20 min  Stress: No Stress Concern Present  . Feeling of Stress : Only a little  Social Connections: Socially Isolated  . Frequency of Communication with Friends and Family: More than three times a week  . Frequency of Social Gatherings with Friends and Family: More than three times a week  . Attends Religious Services: Never  . Active Member of Clubs or Organizations: No  . Attends Archivist Meetings: Never  . Marital Status: Separated   Family History  Problem Relation Age of Onset  . Schizophrenia Father   . Schizophrenia Cousin   . Hyperthyroidism Mother   . Hypothyroidism Maternal Grandmother    Allergies  Allergen Reactions  . Pregabalin Anaphylaxis  . Celebrex  [Celecoxib] Nausea And Vomiting    Stomach pain  . Effexor [Venlafaxine Hcl] Other (See Comments)    Serotonin toxicity  . Mirapex [Pramipexole]     Mirapex- Worsening "body jerks"  . Pristiq [Desvenlafaxine] Other (See Comments)    Serotonin toxicity   . Prozac [Fluoxetine Hcl] Swelling  . Symbyax [Olanzapine-Fluoxetine Hcl] Swelling  . Wellbutrin [Bupropion Hcl] Swelling   Prior to Admission medications   Medication Sig Start Date End Date Taking? Authorizing Provider  ALPRAZolam Duanne Moron) 0.5 MG tablet Take 0.5 mg by mouth 2 (two) times daily as needed for anxiety. 12/20/19  Yes [provider]  ARIPiprazole (ABILIFY) 5 MG tablet Take 1 tablet (5 mg total) by mouth daily. 09/17/15  Yes Clarene Reamer, MD  aspirin EC 81 MG tablet Take 1 tablet (81 mg total) by mouth 2 (two) times daily. To be taken after surgery 10/08/20  Yes Dwana Melena L, PA-C  atorvastatin (LIPITOR) 20 MG tablet TAKE 1 TABLET(20 MG) BY MOUTH DAILY Patient taking differently: Take 20 mg by mouth daily. TAKE 1 TABLET(20 MG) BY MOUTH DAILY 07/07/20  Yes Libby Maw, MD  clomiPRAMINE (ANAFRANIL) 50 MG capsule Take 100 mg by mouth at bedtime.   Yes [provider]  FINACEA 15 % FOAM Apply 1 application topically 2 (two) times daily as needed (rosacea). 02/14/20  Yes [provider]  gabapentin (NEURONTIN) 300 MG capsule TAKE 1 CAPSULE(300 MG) BY MOUTH THREE TIMES DAILY Patient taking differently: Take 300 mg by mouth 3 (three) times daily. 09/16/20  Yes Dutch Quint B, FNP  lamoTRIgine (LAMICTAL) 150 MG tablet Take 150 mg by mouth 2 (two) times daily. 02/22/20  Yes [provider]  levothyroxine (SYNTHROID) 75 MCG tablet Take 1 tablet (75 mcg total) by mouth daily before breakfast. 08/03/20  Yes Libby Maw, MD  lisinopril (ZESTRIL) 10 MG tablet TAKE 1 TABLET(10 MG) BY MOUTH DAILY Patient taking differently: Take 10 mg by mouth daily. TAKE 1 TABLET(10 MG) BY MOUTH DAILY  04/08/20  Yes Libby Maw, MD  metFORMIN (GLUCOPHAGE) 500 MG tablet Take 1 tablet (500 mg total) by mouth 2 (two) times daily with a meal. 04/08/20  Yes Libby Maw, MD  methylphenidate (RITALIN) 20 MG tablet Take 20 mg by mouth 2 (two) times daily. 09/22/20  Yes [provider]  omeprazole (PRILOSEC) 20 MG capsule Take 20 mg by mouth as needed.   Yes [provider]  OVER THE COUNTER MEDICATION Take 2 tablets by mouth every 6 (six) hours as needed (headache). Headache Relief   Yes [provider]  oxyCODONE-acetaminophen (PERCOCET)  10-325 MG tablet Take 1 tablet by mouth every 4 (four) hours as needed for pain.   Yes [provider]  Semaglutide,0.25 or 0.5MG/DOS, 2 MG/1.5ML SOPN Inject 0.25 mg into the skin every Thursday. 07/14/20  Yes [provider]  blood glucose meter kit and supplies KIT Dispense based on patient and insurance preference. Check glucose before breakfast. ICD: E11.65 04/02/20   Nche, Charlene Brooke, NP  docusate sodium (COLACE) 100 MG capsule Take 1 capsule (100 mg total) by mouth daily as needed. 10/08/20 10/08/21  Aundra Dubin, PA-C  glucose blood test strip Use as instructed, check glucose before breakfast. ICD: E11.65 07/21/20   Libby Maw, MD  Lancets (FREESTYLE) lancets Use as instructed. E11.65 07/21/20   Libby Maw, MD  methocarbamol (ROBAXIN) 500 MG tablet Take 1 tablet (500 mg total) by mouth 2 (two) times daily as needed. To be taken after surgery 10/08/20   Aundra Dubin, PA-C  ondansetron (ZOFRAN) 4 MG tablet Take 1 tablet (4 mg total) by mouth every 8 (eight) hours as needed for nausea or vomiting. 10/08/20   Aundra Dubin, PA-C  oxyCODONE-acetaminophen (PERCOCET) 5-325 MG tablet Take 1-2 tablets by mouth every 6 (six) hours as needed. To be taken after surgery 10/08/20   Aundra Dubin, PA-C  phentermine 15 MG capsule Take by mouth. 05/06/20 06/05/20  [provider]   sulfamethoxazole-trimethoprim (BACTRIM DS) 800-160 MG tablet Take 1 tablet by mouth 2 (two) times daily. To be taken after surgery 10/08/20   Aundra Dubin, PA-C     Positive ROS: All other systems have been reviewed and were otherwise negative with the exception of those mentioned in the HPI and as above.  Physical Exam: General: Alert, no acute distress Cardiovascular: No pedal edema Respiratory: No cyanosis, no use of accessory musculature GI: abdomen soft Skin: No lesions in the area of chief complaint Neurologic: Sensation intact distally Psychiatric: Patient is competent for consent with normal mood and affect Lymphatic: no lymphedema  MUSCULOSKELETAL: exam stable  Assessment: LEFT HIP DEGENERATIVE JOINT DISEASE  Plan: Plan for Procedure(s): LEFT TOTAL HIP ARTHROPLASTY ANTERIOR APPROACH  The risks benefits and alternatives were discussed with the patient including but not limited to the risks of nonoperative treatment, versus surgical intervention including infection, bleeding, nerve injury,  blood clots, cardiopulmonary complications, morbidity, mortality, among others, and they were willing to proceed.   Preoperative templating of the joint replacement has been completed, documented, and submitted to the Operating Room personnel in order to optimize intra-operative equipment management.   Eduard Roux, MD 10/12/2020 6:01 AM

## 2020-10-12 NOTE — Discharge Instructions (Signed)

## 2020-10-12 NOTE — Anesthesia Postprocedure Evaluation (Signed)
Anesthesia Post Note  Patient: [name]  Procedure(s) Performed: LEFT TOTAL HIP ARTHROPLASTY ANTERIOR APPROACH (Left Hip)     Patient location during evaluation: PACU Anesthesia Type: Spinal Level of consciousness: awake and alert Pain management: pain level controlled Vital Signs Assessment: post-procedure vital signs reviewed and stable Respiratory status: spontaneous breathing Cardiovascular status: stable Anesthetic complications: no   No complications documented.  Last Vitals:  Vitals:   10/12/20 1045 10/12/20 1116  BP: 117/68 131/78  Pulse: 72 83  Resp: 15 16  Temp:  36.8 C  SpO2: 98% 100%    Last Pain:  Vitals:   10/12/20 1152  TempSrc:   PainSc: [place]

## 2020-10-12 NOTE — Evaluation (Signed)
Physical Therapy Evaluation Patient Details Name: [name] MRN: [id number] DOB: 1970-12-17 Today's Date: 10/12/2020   History of Present Illness  s/p L DA THA. PMH: bipolar, chronic pain, covid, DM, social anxiety disorder, OCD  Clinical Impression  Pt is s/p THA resulting in the deficits listed below (see PT Problem List).  Pt doing well today, amb ~80' with RW and min assist/min-guard. Anticipate steady progress in acute setting.  Pt will benefit from skilled PT to increase their independence and safety with mobility to allow discharge to the venue listed below.      Follow Up Recommendations Follow surgeon's recommendation for DC plan and follow-up therapies    Equipment Recommendations  Rolling walker with 5" wheels    Recommendations for Other Services       Precautions / Restrictions Precautions Precautions: Fall Restrictions Weight Bearing Restrictions: No Other Position/Activity Restrictions: WBAT      Mobility  Bed Mobility Overal bed mobility: Needs Assistance Bed Mobility: Supine to Sit;Sit to Supine     Supine to sit: Min assist Sit to supine: Min assist   General bed mobility comments: assist with LLE    Transfers Overall transfer level: Needs assistance Equipment used: Rolling walker (2 wheeled) Transfers: Sit to/from Stand Sit to Stand: Min assist;Min guard         General transfer comment: cues for hand placement  Ambulation/Gait Ambulation/Gait assistance: Min guard;Min assist Gait Distance (Feet): 80 Feet Assistive device: Rolling walker (2 wheeled) Gait Pattern/deviations: Step-to pattern;Decreased stance time - left     General Gait Details: cues for sequence, min to min/guard for safety  Stairs            Wheelchair Mobility    Modified Rankin (Stroke Patients Only)       Balance Overall balance assessment: No apparent balance deficits (not formally assessed)                                            Pertinent Vitals/Pain Pain Assessment: 0-10 Pain Score: 7  Pain Location: L hip Pain Descriptors / Indicators: Grimacing;Sore Pain Intervention(s): Repositioned;Limited activity within patient's tolerance;Monitored during session;Ice applied;Premedicated before session    Home Living Family/patient expects to be discharged to:: Private residence Living Arrangements: Spouse/significant other Available Help at Discharge: Family   Home Access: Stairs to enter   Technical brewer of Steps: 1 "baby step" and then 4" step Home Layout: One level Home Equipment: Bedside commode      Prior Function Level of Independence: Independent               Hand Dominance        Extremity/Trunk Assessment   Upper Extremity Assessment Upper Extremity Assessment: Overall WFL for tasks assessed    Lower Extremity Assessment Lower Extremity Assessment: LLE deficits/detail LLE Deficits / Details: ankle WFL, knee and hip  grossly 2+ to 3/5, limited by post op pain/weakness       Communication   Communication: No difficulties  Cognition Arousal/Alertness: Awake/alert Behavior During Therapy: WFL for tasks assessed/performed Overall Cognitive Status: Within Functional Limits for tasks assessed                                        General Comments      Exercises Total Joint  Exercises Ankle Circles/Pumps: AROM;Both;10 reps Quad Sets: AROM;Both;5 reps   Assessment/Plan    PT Assessment Patient needs continued PT services  PT Problem List Decreased mobility;Decreased activity tolerance;Decreased knowledge of use of DME;Pain       PT Treatment Interventions Functional mobility training;Gait training;DME instruction;Therapeutic activities;Therapeutic exercise;Patient/family education;Stair training    PT Goals (Current goals can be found in the Care Plan section)  Acute Rehab PT Goals Patient Stated Goal: home Tuesday, less hip pain PT Goal  Formulation: With patient Time For Goal Achievement: 10/19/20 Potential to Achieve Goals: Good    Frequency 7X/week   Barriers to discharge        Co-evaluation               AM-PAC PT "6 Clicks" Mobility  Outcome Measure Help needed turning from your back to your side while in a flat bed without using bedrails?: A Little Help needed moving from lying on your back to sitting on the side of a flat bed without using bedrails?: A Little Help needed moving to and from a bed to a chair (including a wheelchair)?: A Little Help needed standing up from a chair using your arms (e.g., wheelchair or bedside chair)?: A Little Help needed to walk in hospital room?: A Little Help needed climbing 3-5 steps with a railing? : A Little 6 Click Score: 18    End of Session Equipment Utilized During Treatment: Gait belt Activity Tolerance: Patient tolerated treatment well Patient left: with call bell/phone within reach;in bed;with bed alarm set Nurse Communication: Mobility status PT Visit Diagnosis: Difficulty in walking, not elsewhere classified (R26.2)    Time: 1335-1405 PT Time Calculation (min) (ACUTE ONLY): 30 min   Charges:   PT Evaluation $PT Eval Low Complexity: 1 Low PT Treatments $Gait Training: 8-22 mins        Baxter Flattery, PT  Acute Rehab Dept ([place]) [phone] Pager [phone]  10/12/2020   [facility] 10/12/2020, 2:13 PM

## 2020-10-12 NOTE — Progress Notes (Signed)
[name], from now on, we would like to ensure that any of our mrsa positive total joint patients are not first case.  Also, is there a possibility of them having their labs drawn a little sooner so that I get their results in my box sooner than day of surgery?  At my previous job, we were able to get labs much sooner so that we would have adequate time to start them on antibiotic for UTI and things of the sort.  Thanks!

## 2020-10-12 NOTE — Anesthesia Procedure Notes (Signed)
Spinal  Patient location during procedure: OR Start time: 10/12/2020 7:24 AM End time: 10/12/2020 7:28 AM Staffing Performed: anesthesiologist  Anesthesiologist: Nolon Nations, MD Preanesthetic Checklist Completed: patient identified, IV checked, site marked, risks and benefits discussed, surgical consent, monitors and equipment checked, pre-op evaluation and timeout performed Spinal Block Patient position: sitting Prep: DuraPrep Patient monitoring: heart rate, continuous pulse ox and blood pressure Approach: right paramedian Location: L2-3 Injection technique: single-shot Needle Needle type: Sprotte  Needle gauge: 24 G Needle length: 9 cm Additional Notes Expiration date of kit checked and confirmed. Patient tolerated procedure well, without complications.

## 2020-10-13 ENCOUNTER — Other Ambulatory Visit: Payer: Self-pay | Admitting: Physician Assistant

## 2020-10-13 ENCOUNTER — Encounter (HOSPITAL_COMMUNITY): Payer: Self-pay | Admitting: Orthopaedic Surgery

## 2020-10-13 DIAGNOSIS — Z96642 Presence of left artificial hip joint: Secondary | ICD-10-CM | POA: Diagnosis not present

## 2020-10-13 DIAGNOSIS — M1612 Unilateral primary osteoarthritis, left hip: Secondary | ICD-10-CM | POA: Diagnosis not present

## 2020-10-13 LAB — CBC
HCT: 31.3 % — ABNORMAL LOW (ref 36.0–46.0)
Hemoglobin: 10.1 g/dL — ABNORMAL LOW (ref 12.0–15.0)
MCH: 26.8 pg (ref 26.0–34.0)
MCHC: 32.3 g/dL (ref 30.0–36.0)
MCV: 83 fL (ref 80.0–100.0)
Platelets: 266 10*3/uL (ref 150–400)
RBC: 3.77 MIL/uL — ABNORMAL LOW (ref 3.87–5.11)
RDW: 16.1 % — ABNORMAL HIGH (ref 11.5–15.5)
WBC: 16 10*3/uL — ABNORMAL HIGH (ref 4.0–10.5)
nRBC: 0 % (ref 0.0–0.2)

## 2020-10-13 LAB — BASIC METABOLIC PANEL
Anion gap: 8 (ref 5–15)
BUN: 15 mg/dL (ref 6–20)
CO2: 24 mmol/L (ref 22–32)
Calcium: 8.1 mg/dL — ABNORMAL LOW (ref 8.9–10.3)
Chloride: 101 mmol/L (ref 98–111)
Creatinine, Ser: 0.82 mg/dL (ref 0.44–1.00)
GFR, Estimated: >60 mL/min (ref >60)
Glucose, Bld: 103 mg/dL — ABNORMAL HIGH (ref 70–99)
Potassium: 3.8 mmol/L (ref 3.5–5.1)
Sodium: 133 mmol/L — ABNORMAL LOW (ref 135–145)

## 2020-10-13 LAB — GLUCOSE, CAPILLARY
Glucose-Capillary: 99 mg/dL (ref 70–99)
Glucose-Capillary: 107 mg/dL — ABNORMAL HIGH (ref 70–99)

## 2020-10-13 MED ORDER — CHLORHEXIDINE GLUCONATE CLOTH 2 % EX PADS
6.0000 | MEDICATED_PAD | Freq: Every day | CUTANEOUS | Status: DC
  Administered 2020-10-13: 6 via TOPICAL

## 2020-10-13 MED ORDER — OXYCODONE-ACETAMINOPHEN 10-325 MG PO TABS: 1.0000 | ORAL_TABLET | Freq: Four times a day (QID) | ORAL | 0 refills | Status: DC | PRN

## 2020-10-13 NOTE — Progress Notes (Signed)
Physical Therapy Treatment Patient Details Name: [name] MRN: [id number] DOB: [birth year] Today's Date: 10/13/2020    History of Present Illness s/p L DA THA. PMH: bipolar, chronic pain, covid, DM, social anxiety disorder, OCD    PT Comments    Pt progressing well, requesting to go to bathroom. Pt is mobilizing well, needs pain mes. Nursing Students currently attempting to give  meds. Will return later to progress gait and review HEP.    Follow Up Recommendations  Follow surgeon's recommendation for DC plan and follow-up therapies     Equipment Recommendations  Rolling walker with 5" wheels    Recommendations for Other Services       Precautions / Restrictions Precautions Precautions: Fall Restrictions Weight Bearing Restrictions: No Other Position/Activity Restrictions: WBAT    Mobility  Bed Mobility Overal bed mobility: Needs Assistance Bed Mobility: Supine to Sit     Supine to sit: Min guard;Supervision     General bed mobility comments: for safety  Transfers Overall transfer level: Needs assistance Equipment used: Rolling walker (2 wheeled) Transfers: Sit to/from Stand Sit to Stand: Supervision         General transfer comment: cues for hand placement  Ambulation/Gait Ambulation/Gait assistance: Supervision Gait Distance (Feet): 15 Feet (x2) Assistive device: Rolling walker (2 wheeled) Gait Pattern/deviations: Step-to pattern;Decreased stance time - left     General Gait Details: cues for sequence, min to min/guard for safety   Stairs             Wheelchair Mobility    Modified Rankin (Stroke Patients Only)       Balance Overall balance assessment: No apparent balance deficits (not formally assessed)                                          Cognition Arousal/Alertness: Awake/alert Behavior During Therapy: WFL for tasks assessed/performed Overall Cognitive Status: Within Functional Limits for tasks assessed                                         Exercises      General Comments        Pertinent Vitals/Pain Pain Assessment: 0-10 Pain Score: 8  Pain Location: L hip Pain Descriptors / Indicators: Grimacing;Sore Pain Intervention(s): Limited activity within patient's tolerance;Monitored during session;Premedicated before session;Repositioned;Ice applied    Home Living                      Prior Function            PT Goals (current goals can now be found in the care plan section) Acute Rehab PT Goals Patient Stated Goal: home Tuesday, less hip pain PT Goal Formulation: With patient Time For Goal Achievement: 10/19/20 Potential to Achieve Goals: Good Progress towards PT goals: Progressing toward goals    Frequency    7X/week      PT Plan Current plan remains appropriate    Co-evaluation              AM-PAC PT "6 Clicks" Mobility   Outcome Measure  Help needed turning from your back to your side while in a flat bed without using bedrails?: None Help needed moving from lying on your back to sitting on the side of a flat bed without  using bedrails?: None Help needed moving to and from a bed to a chair (including a wheelchair)?: None Help needed standing up from a chair using your arms (e.g., wheelchair or bedside chair)?: A Little Help needed to walk in hospital room?: A Little   6 Click Score: 18    End of Session Equipment Utilized During Treatment: Gait belt Activity Tolerance: Patient tolerated treatment well Patient left: with call bell/phone within reach;in bed;with bed alarm set Nurse Communication: Mobility status PT Visit Diagnosis: Difficulty in walking, not elsewhere classified (R26.2)     Time: 1010-1030 PT Time Calculation (min) (ACUTE ONLY): 20 min  Charges:  $Gait Training: 8-22 mins                     Baxter Flattery, PT  Acute Rehab Dept ([place]) [phone] Pager [phone]  10/13/2020    [facility] 10/13/2020,  10:35 AM

## 2020-10-13 NOTE — Progress Notes (Signed)
Subjective: 1 Day Post-Op Procedure(s) (LRB): LEFT TOTAL HIP ARTHROPLASTY ANTERIOR APPROACH (Left) Patient reports pain as moderate.    Objective: Vital signs in last 24 hours: Temp:  [94.5 F (34.7 C)-98.8 F (37.1 C)] 97.9 F (36.6 C) (02/08 0516) Pulse Rate:  [72-103] 76 (02/08 0516) Resp:  [12-28] 16 (02/08 0516) BP: (96-131)/(56-78) 104/57 (02/08 0516) SpO2:  [81 %-100 %] 100 % (02/08 0516)  Intake/Output from previous day: 02/07 0701 - 02/08 0700 In: 5880.5 [P.O.:2200; I.V.:2761.2; IV Piggyback:919.3] Out: 4350 [Urine:4150; Blood:200] Intake/Output this shift: No intake/output data recorded.  Recent Labs    10/13/20 0318  HGB 10.1*   Recent Labs    10/13/20 0318  WBC 16.0*  RBC 3.77*  HCT 31.3*  PLT 266   Recent Labs    10/13/20 0318  NA 133*  K 3.8  CL 101  CO2 24  BUN 15  CREATININE 0.82  GLUCOSE 103*  CALCIUM 8.1*   No results for input(s): LABPT, INR in the last 72 hours.  Neurologically intact Neurovascular intact Sensation intact distally Intact pulses distally Dorsiflexion/Plantar flexion intact Incision: dressing C/D/I No cellulitis present Compartment soft   Assessment/Plan: 1 Day Post-Op Procedure(s) (LRB): LEFT TOTAL HIP ARTHROPLASTY ANTERIOR APPROACH (Left) Advance diet Up with therapy D/C IV fluids Discharge home with home health  WBAT LLE ABLA- mild and stable Please apply ted hose to BLE D/c foley Post-op meds sent in to pharmacy last week      Aundra Dubin 10/13/2020, 7:50 AM

## 2020-10-13 NOTE — Progress Notes (Signed)
Physical Therapy Treatment Patient Details Name: [name] MRN: [id number] DOB: [birth year] Today's Date: 10/13/2020    History of Present Illness s/p L DA THA. PMH: bipolar, chronic pain, covid, DM, social anxiety disorder, OCD    PT Comments    Pt progressing very well; reviewed gait safety, THA HEP. Pain controlled with mobility. Ready for d/c with family assist prn    Follow Up Recommendations  Follow surgeon's recommendation for DC plan and follow-up therapies     Equipment Recommendations  Rolling walker with 5" wheels    Recommendations for Other Services       Precautions / Restrictions Precautions Precautions: Fall Restrictions Weight Bearing Restrictions: No Other Position/Activity Restrictions: WBAT    Mobility  Bed Mobility Overal bed mobility: Needs Assistance Bed Mobility: Supine to Sit       Sit to supine: Modified independent (Device/Increase time)      Transfers Overall transfer level: Needs assistance Equipment used: Rolling walker (2 wheeled) Transfers: Sit to/from Stand Sit to Stand: Modified independent (Device/Increase time)         General transfer comment: cues for hand placement  Ambulation/Gait Ambulation/Gait assistance: Supervision Gait Distance (Feet): 250 Feet Assistive device: Rolling walker (2 wheeled) Gait Pattern/deviations: Step-to pattern;Decreased stance time - left     General Gait Details: cues for sequence, min to min/guard for safety   Stairs             Wheelchair Mobility    Modified Rankin (Stroke Patients Only)       Balance Overall balance assessment: No apparent balance deficits (not formally assessed)                                          Cognition Arousal/Alertness: Awake/alert Behavior During Therapy: WFL for tasks assessed/performed Overall Cognitive Status: Within Functional Limits for tasks assessed                                         Exercises      General Comments        Pertinent Vitals/Pain Pain Location: L hip Pain Descriptors / Indicators: Grimacing;Sore    Home Living                      Prior Function            PT Goals (current goals can now be found in the care plan section) Acute Rehab PT Goals Patient Stated Goal: home Tuesday, less hip pain PT Goal Formulation: With patient Time For Goal Achievement: 10/19/20 Potential to Achieve Goals: Good Progress towards PT goals: Progressing toward goals    Frequency    7X/week      PT Plan Current plan remains appropriate    Co-evaluation              AM-PAC PT "6 Clicks" Mobility   Outcome Measure  Help needed turning from your back to your side while in a flat bed without using bedrails?: None Help needed moving from lying on your back to sitting on the side of a flat bed without using bedrails?: None Help needed moving to and from a bed to a chair (including a wheelchair)?: None Help needed standing up from a chair using your arms (e.g., wheelchair or  bedside chair)?: A Little Help needed to walk in hospital room?: A Little Help needed climbing 3-5 steps with a railing? : A Little 6 Click Score: 21    End of Session Equipment Utilized During Treatment: Gait belt Activity Tolerance: Patient tolerated treatment well Patient left: with call bell/phone within reach;in bed;with bed alarm set Nurse Communication: Mobility status PT Visit Diagnosis: Difficulty in walking, not elsewhere classified (R26.2)     Time: 3299-2426 PT Time Calculation (min) (ACUTE ONLY): 19 min  Charges:  $Therapeutic Exercise: 8-22 mins                     Baxter Flattery, PT  Acute Rehab Dept ([place]) [phone] Pager [phone]  10/13/2020    [facility] 10/13/2020, 4:16 PM

## 2020-10-13 NOTE — Progress Notes (Signed)
RN reviewed discharge instructions with patient and family. All questions answered.   Paperwork given. Prescriptions electronically sent to patient pharmacy.    NT rolled patient down with all belongings to family car.    [place], Therapist, sports

## 2020-10-13 NOTE — TOC Progression Note (Signed)
Transition of Care [facility]) - Progression Note    Patient Details  Name: [name] MRN: [id number] Date of Birth: 1971-05-07  [facility]) CM/SW Contact  Joaquin Courts, RN Phone Number: 10/13/2020, 10:52 AM  Clinical Narrative:    [facility] for HHPT services.  [employer] to deliver rolling walker and 3in1.    Expected Discharge Plan: [place] Barriers to Discharge: No Barriers Identified  Expected Discharge Plan and Services Expected Discharge Plan: [place]   Discharge Planning Services: CM Consult Post Acute Care Choice: Universal arrangements for the past 2 months: Single Family Home Expected Discharge Date: 10/13/20               DME Arranged: Gilford Rile rolling,3-N-1 DME Agency: [employer] Date DME Agency Contacted: 10/13/20 Time DME Agency Contacted: [id number] Representative spoke with at DME Agency: Ovid Curd HH Arranged: PT [place]: [facility] [employer] Date [place]: 10/13/20   Representative spoke with at [place]: pre-arranged in md office   Social Determinants of Health ([place]) Interventions    Readmission Risk Interventions No flowsheet data found.

## 2020-10-13 NOTE — Progress Notes (Signed)
   10/13/20 1100  PT Visit Information  Last PT Received On 10/13/20  Pt progressing toward goals. Incr gait distance, tolerated well with no incr pain other than burning at incision site. Placed ice after PT. Will see again to review HEP.   Assistance Needed +1  History of Present Illness s/p L DA THA. PMH: bipolar, chronic pain, covid, DM, social anxiety disorder, OCD  Subjective Data  Patient Stated Goal home Tuesday, less hip pain  Precautions  Precautions Fall  Restrictions  Weight Bearing Restrictions No  Other Position/Activity Restrictions WBAT  Pain Assessment  Pain Assessment 0-10  Pain Score 5  Pain Location L hip  Pain Descriptors / Indicators Grimacing;Sore  Pain Intervention(s) Limited activity within patient's tolerance;Monitored during session;Premedicated before session;Repositioned;Ice applied  Cognition  Arousal/Alertness Awake/alert  Behavior During Therapy WFL for tasks assessed/performed  Overall Cognitive Status Within Functional Limits for tasks assessed  Bed Mobility  Overal bed mobility Needs Assistance  Bed Mobility Sit to Supine  Supine to sit Min guard;Supervision  General bed mobility comments for safety  Transfers  Overall transfer level Needs assistance  Equipment used Rolling walker (2 wheeled)  Transfers Sit to/from Stand  Sit to Stand Supervision  General transfer comment cues for hand placement  Ambulation/Gait  Ambulation/Gait assistance Supervision  Gait Distance (Feet) 200 Feet  Assistive device Rolling walker (2 wheeled)  Gait Pattern/deviations Step-to pattern;Decreased stance time - left  General Gait Details cues for sequence, min to min/guard for safety  Balance  Overall balance assessment No apparent balance deficits (not formally assessed)  PT - End of Session  Equipment Utilized During Treatment Gait belt  Activity Tolerance Patient tolerated treatment well  Patient left with call bell/phone within reach;in bed;with bed  alarm set  Nurse Communication Mobility status   PT - Assessment/Plan  PT Plan Current plan remains appropriate  PT Visit Diagnosis Difficulty in walking, not elsewhere classified (R26.2)  PT Frequency (ACUTE ONLY) 7X/week  Follow Up Recommendations Follow surgeon's recommendation for DC plan and follow-up therapies  PT equipment Rolling walker with 5" wheels  AM-PAC PT "6 Clicks" Mobility Outcome Measure (Version 2)  Help needed turning from your back to your side while in a flat bed without using bedrails? 4  Help needed moving from lying on your back to sitting on the side of a flat bed without using bedrails? 4  Help needed moving to and from a bed to a chair (including a wheelchair)? 4  Help needed standing up from a chair using your arms (e.g., wheelchair or bedside chair)? 3  Help needed to walk in hospital room? 3  Help needed climbing 3-5 steps with a railing?  3  6 Click Score 21  Consider Recommendation of Discharge To: Home with no services  PT Goal Progression  Progress towards PT goals Progressing toward goals  Acute Rehab PT Goals  PT Goal Formulation With patient  Time For Goal Achievement 10/19/20  Potential to Achieve Goals Good  PT Time Calculation  PT Start Time (ACUTE ONLY) 1112  PT Stop Time (ACUTE ONLY) 1129  PT Time Calculation (min) (ACUTE ONLY) 17 min  PT General Charges  $$ ACUTE PT VISIT 1 Visit  PT Treatments  $Gait Training 8-22 mins

## 2020-10-13 NOTE — Discharge Summary (Signed)
Patient ID: [name] MRN: [id number] DOB/AGE: 48/18/1972 48 y.o.  Admit date: 10/12/2020 Discharge date: 10/13/2020  Admission Diagnoses:  Principal Problem:   Primary osteoarthritis of left hip Active Problems:   Status post total replacement of left hip   Discharge Diagnoses:  Same  Past Medical History:  Diagnosis Date  . ADHD (attention deficit hyperactivity disorder)   . Anemia    with pregnancy  . Anxiety   . Arthritis   . Bipolar disorder ([place])   . Chronic back pain greater than 3 months duration   . Depression   . Diabetes mellitus without complication ([place])   . Diabetic nephropathy ([place])    bilateral feet  . Dysuria    has to have self in and out cath   . Eroded bladder suspension mesh ([place])   . GERD (gastroesophageal reflux disease)   . Hepatitis    C  . History of COVID-19 11/2019  . History of MRSA infection    after back surgery  . History of sleep apnea    resolved after weight loss  . Hypertension   . Hypothyroidism   . Leg pain, bilateral   . Obesity   . Obsessive-compulsive disorder   . Pneumonia 12/24/2019  . Rosacea   . Sciatic nerve pain   . Social anxiety disorder   . Squamous cell carcinoma of back 2021    Surgeries: Procedure(s): LEFT TOTAL HIP ARTHROPLASTY ANTERIOR APPROACH on 10/12/2020   Consultants:   Discharged Condition: Improved  Hospital Course: [name] is an 50 y.o. female who was admitted 10/12/2020 for operative treatment ofPrimary osteoarthritis of left hip. Patient has severe unremitting pain that affects sleep, daily activities, and work/hobbies. After pre-op clearance the patient was taken to the operating room on 10/12/2020 and underwent  Procedure(s): LEFT TOTAL HIP ARTHROPLASTY ANTERIOR APPROACH.    Patient was given perioperative antibiotics:  Anti-infectives (From admission, onward)   Start     Dose/Rate Route Frequency Ordered Stop   10/12/20 1800  vancomycin (VANCOCIN) IVPB 1000 mg/200 mL premix         1,000 mg 200 mL/hr over 60 Minutes Intravenous Every 12 hours 10/12/20 1124 10/13/20 0624   10/12/20 0718  vancomycin (VANCOCIN) powder  Status:  Discontinued          As needed 10/12/20 0719 10/12/20 1107   10/12/20 0600  ceFAZolin (ANCEF) IVPB 2g/100 mL premix        2 g 200 mL/hr over 30 Minutes Intravenous On call to O.R. 10/12/20 0545 10/12/20 0755   10/12/20 0600  vancomycin (VANCOREADY) IVPB 1500 mg/300 mL        1,500 mg 150 mL/hr over 120 Minutes Intravenous 90 min pre-op 10/11/20 1020 10/12/20 0939       Patient was given sequential compression devices, early ambulation, and chemoprophylaxis to prevent DVT.  Patient benefited maximally from hospital stay and there were no complications.    Recent vital signs:  Patient Vitals for the past 24 hrs:  BP Temp Temp src Pulse Resp SpO2 Height  10/13/20 0516 (!) 104/57 97.9 F (36.6 C) -- 76 16 100 % --  10/13/20 0034 (!) 108/56 98 F (36.7 C) -- 79 16 98 % --  10/12/20 2114 (!) 108/56 98.8 F (37.1 C) -- 79 16 100 % --  10/12/20 1832 (!) 108/59 97.8 F (36.6 C) -- 87 18 99 % --  10/12/20 1414 (!) 115/56 98.7 F (37.1 C) Oral 75 16 97 % --  10/12/20 1328 105/66 98.5 F (36.9 C) Oral 79 16 97 % --  10/12/20 1254 -- -- -- -- -- -- 5' 5" (1.651 m)  10/12/20 1215 96/67 98.1 F (36.7 C) Oral 80 18 99 % --  10/12/20 1116 131/78 98.2 F (36.8 C) Oral 83 16 100 % --  10/12/20 1045 117/68 -- -- 72 15 98 % --  10/12/20 1033 -- -- -- 79 (!) 25 100 % --  10/12/20 1030 118/67 -- -- 74 12 100 % --  10/12/20 1015 127/77 -- -- (!) 103 (!) 26 (!) 81 % --  10/12/20 1000 117/70 -- -- 79 (!) 23 99 % --  10/12/20 0959 -- -- -- 81 17 99 % --  10/12/20 0949 -- -- -- 77 (!) 22 97 % --  10/12/20 0946 -- -- -- 84 (!) 28 96 % --  10/12/20 0945 112/65 -- -- 79 (!) 21 96 % --  10/12/20 0930 124/73 97.7 F (36.5 C) -- 77 17 100 % --  10/12/20 0925 113/68 (!) 94.5 F (34.7 C) -- 79 16 100 % --     Recent laboratory studies:  Recent Labs     10/13/20 0318  WBC 16.0*  HGB 10.1*  HCT 31.3*  PLT 266  NA 133*  K 3.8  CL 101  CO2 24  BUN 15  CREATININE 0.82  GLUCOSE 103*  CALCIUM 8.1*     Discharge Medications:   Allergies as of 10/13/2020      Reactions   Pregabalin Anaphylaxis   Celebrex [celecoxib] Nausea And Vomiting   Stomach pain   Effexor [venlafaxine Hcl] Other (See Comments)   Serotonin toxicity   Mirapex [pramipexole]    Mirapex- Worsening "body jerks"   Pristiq [desvenlafaxine] Other (See Comments)   Serotonin toxicity    Prozac [fluoxetine Hcl] Swelling   Symbyax [olanzapine-fluoxetine Hcl] Swelling   Wellbutrin [bupropion Hcl] Swelling      Medication List    TAKE these medications   ALPRAZolam 0.5 MG tablet Commonly known as: XANAX Take 0.5 mg by mouth 2 (two) times daily as needed for anxiety.   ARIPiprazole 5 MG tablet Commonly known as: ABILIFY Take 1 tablet (5 mg total) by mouth daily.   aspirin EC 81 MG tablet Take 1 tablet (81 mg total) by mouth 2 (two) times daily. To be taken after surgery   atorvastatin 20 MG tablet Commonly known as: LIPITOR TAKE 1 TABLET(20 MG) BY MOUTH DAILY What changed:   how much to take  how to take this  when to take this   blood glucose meter kit and supplies Kit Dispense based on patient and insurance preference. Check glucose before breakfast. ICD: E11.65   clomiPRAMINE 50 MG capsule Commonly known as: ANAFRANIL Take 100 mg by mouth at bedtime.   docusate sodium 100 MG capsule Commonly known as: Colace Take 1 capsule (100 mg total) by mouth daily as needed.   Finacea 15 % Foam Generic drug: Azelaic Acid Apply 1 application topically 2 (two) times daily as needed (rosacea).   freestyle lancets Use as instructed. E11.65   gabapentin 300 MG capsule Commonly known as: NEURONTIN TAKE 1 CAPSULE(300 MG) BY MOUTH THREE TIMES DAILY What changed: See the new instructions.   glucose blood test strip Use as instructed, check glucose before  breakfast. ICD: E11.65   lamoTRIgine 150 MG tablet Commonly known as: LAMICTAL Take 150 mg by mouth 2 (two) times daily.   levothyroxine 75 MCG tablet Commonly known  as: SYNTHROID Take 1 tablet (75 mcg total) by mouth daily before breakfast.   lisinopril 10 MG tablet Commonly known as: ZESTRIL TAKE 1 TABLET(10 MG) BY MOUTH DAILY What changed:   how much to take  how to take this  when to take this   metFORMIN 500 MG tablet Commonly known as: GLUCOPHAGE Take 1 tablet (500 mg total) by mouth 2 (two) times daily with a meal.   methocarbamol 500 MG tablet Commonly known as: Robaxin Take 1 tablet (500 mg total) by mouth 2 (two) times daily as needed. To be taken after surgery   methylphenidate 20 MG tablet Commonly known as: RITALIN Take 20 mg by mouth 2 (two) times daily.   omeprazole 20 MG capsule Commonly known as: PRILOSEC Take 20 mg by mouth as needed.   ondansetron 4 MG tablet Commonly known as: Zofran Take 1 tablet (4 mg total) by mouth every 8 (eight) hours as needed for nausea or vomiting.   OVER THE COUNTER MEDICATION Take 2 tablets by mouth every 6 (six) hours as needed (headache). Headache Relief   oxyCODONE-acetaminophen 5-325 MG tablet Commonly known as: Percocet Take 1-2 tablets by mouth every 6 (six) hours as needed. To be taken after surgery What changed: Another medication with the same name was removed. Continue taking this medication, and follow the directions you see here.   phentermine 15 MG capsule Take by mouth.   Semaglutide(0.25 or 0.5MG/DOS) 2 MG/1.5ML Sopn Inject 0.25 mg into the skin every Thursday.   sulfamethoxazole-trimethoprim 800-160 MG tablet Commonly known as: BACTRIM DS Take 1 tablet by mouth 2 (two) times daily. To be taken after surgery            Durable Medical Equipment  (From admission, onward)         Start     Ordered   10/12/20 1110  DME Walker rolling  Once       Question:  Patient needs a walker to treat  with the following condition  Answer:  History of hip replacement   10/12/20 1109   10/12/20 1110  DME 3 n 1  Once        10/12/20 1109   10/12/20 1110  DME Bedside commode  Once       Question:  Patient needs a bedside commode to treat with the following condition  Answer:  History of hip replacement   10/12/20 1109          Diagnostic Studies: DG Pelvis Portable  Result Date: 10/12/2020 CLINICAL DATA:  Status post left hip replacement EXAM: PORTABLE PELVIS 1 VIEWS COMPARISON:  Films from earlier in the same day. FINDINGS: Left hip prosthesis is noted in satisfactory position. No acute bony or soft tissue abnormality is seen. IMPRESSION: Status post left hip replacement Electronically Signed   By: Inez Catalina M.D.   On: 10/12/2020 10:05   DG C-Arm 1-60 Min-No Report  Result Date: 10/12/2020 CLINICAL DATA:  Status post left hip replacement. EXAM: OPERATIVE left HIP (WITH PELVIS IF PERFORMED) 2 VIEWS TECHNIQUE: Fluoroscopic spot image(s) were submitted for interpretation post-operatively. Radiation exposure index: 5.978 mGy. COMPARISON:  None. FINDINGS: Two intraoperative fluoroscopic images were obtained of the left hip. The left acetabular and femoral components appear to be well situated. IMPRESSION: Status post left total hip arthroplasty. Electronically Signed   By: Marijo Conception M.D.   On: 10/12/2020 09:15   DG HIP OPERATIVE UNILAT W OR W/O PELVIS LEFT  Result Date: 10/12/2020 CLINICAL DATA:  Status post left hip replacement. EXAM: OPERATIVE left HIP (WITH PELVIS IF PERFORMED) 2 VIEWS TECHNIQUE: Fluoroscopic spot image(s) were submitted for interpretation post-operatively. Radiation exposure index: 5.978 mGy. COMPARISON:  None. FINDINGS: Two intraoperative fluoroscopic images were obtained of the left hip. The left acetabular and femoral components appear to be well situated. IMPRESSION: Status post left total hip arthroplasty. Electronically Signed   By: Marijo Conception M.D.   On:  10/12/2020 09:15    Disposition: Discharge disposition: 01-Home or Self Care          Follow-up Information    Leandrew Koyanagi, MD. Schedule an appointment as soon as possible for a visit in 2 week(s).   Specialty: Orthopedic Surgery Contact information: [address] [place] [address] [phone]                Signed: Aundra Dubin 10/13/2020, 7:53 AM

## 2020-10-13 NOTE — Progress Notes (Signed)
I reviewed and concur with student's documentation.  Noted dc'd saline lock before discharge to home, IV cath intact. Site clean, dry, intact. Pt tolerated well.

## 2020-10-14 ENCOUNTER — Telehealth: Payer: Self-pay

## 2020-10-14 DIAGNOSIS — Z79899 Other long term (current) drug therapy: Secondary | ICD-10-CM | POA: Diagnosis not present

## 2020-10-14 DIAGNOSIS — F319 Bipolar disorder, unspecified: Secondary | ICD-10-CM | POA: Diagnosis not present

## 2020-10-14 DIAGNOSIS — K219 Gastro-esophageal reflux disease without esophagitis: Secondary | ICD-10-CM | POA: Diagnosis not present

## 2020-10-14 DIAGNOSIS — Z96642 Presence of left artificial hip joint: Secondary | ICD-10-CM | POA: Diagnosis not present

## 2020-10-14 DIAGNOSIS — Z7984 Long term (current) use of oral hypoglycemic drugs: Secondary | ICD-10-CM | POA: Diagnosis not present

## 2020-10-14 DIAGNOSIS — D649 Anemia, unspecified: Secondary | ICD-10-CM | POA: Diagnosis not present

## 2020-10-14 DIAGNOSIS — Z6838 Body mass index (BMI) 38.0-38.9, adult: Secondary | ICD-10-CM | POA: Diagnosis not present

## 2020-10-14 DIAGNOSIS — Z7982 Long term (current) use of aspirin: Secondary | ICD-10-CM | POA: Diagnosis not present

## 2020-10-14 DIAGNOSIS — Z471 Aftercare following joint replacement surgery: Secondary | ICD-10-CM | POA: Diagnosis not present

## 2020-10-14 DIAGNOSIS — F17210 Nicotine dependence, cigarettes, uncomplicated: Secondary | ICD-10-CM | POA: Diagnosis not present

## 2020-10-14 DIAGNOSIS — Z8614 Personal history of Methicillin resistant Staphylococcus aureus infection: Secondary | ICD-10-CM | POA: Diagnosis not present

## 2020-10-14 DIAGNOSIS — F429 Obsessive-compulsive disorder, unspecified: Secondary | ICD-10-CM | POA: Diagnosis not present

## 2020-10-14 DIAGNOSIS — I10 Essential (primary) hypertension: Secondary | ICD-10-CM | POA: Diagnosis not present

## 2020-10-14 DIAGNOSIS — F419 Anxiety disorder, unspecified: Secondary | ICD-10-CM | POA: Diagnosis not present

## 2020-10-14 DIAGNOSIS — Z85828 Personal history of other malignant neoplasm of skin: Secondary | ICD-10-CM | POA: Diagnosis not present

## 2020-10-14 DIAGNOSIS — F909 Attention-deficit hyperactivity disorder, unspecified type: Secondary | ICD-10-CM | POA: Diagnosis not present

## 2020-10-14 DIAGNOSIS — E1121 Type 2 diabetes mellitus with diabetic nephropathy: Secondary | ICD-10-CM | POA: Diagnosis not present

## 2020-10-14 DIAGNOSIS — G8929 Other chronic pain: Secondary | ICD-10-CM | POA: Diagnosis not present

## 2020-10-14 DIAGNOSIS — E669 Obesity, unspecified: Secondary | ICD-10-CM | POA: Diagnosis not present

## 2020-10-14 DIAGNOSIS — E039 Hypothyroidism, unspecified: Secondary | ICD-10-CM | POA: Diagnosis not present

## 2020-10-14 MED ORDER — DOXYCYCLINE HYCLATE 100 MG PO TABS
100.0000 mg | ORAL_TABLET | Freq: Two times a day (BID) | ORAL | 0 refills | Status: DC
Start: 2020-10-14 — End: 2020-10-27

## 2020-10-14 NOTE — Telephone Encounter (Signed)
See message.

## 2020-10-14 NOTE — Telephone Encounter (Signed)
Pt calling to speak with Tequila in regards to her hip replacement.  Pt explains that her metformin and the Lamictal have interactions with her antibiotic and wanted to if a different antibiotic can be called in for her.   I informed pt that she should call the surgeons office who prescribed the medication and that I would send this message back to nurse for F.Y.I.

## 2020-10-14 NOTE — Telephone Encounter (Signed)
Patient aware.

## 2020-10-14 NOTE — Addendum Note (Signed)
Addended by: Azucena Cecil on: 10/14/2020 03:35 PM   Modules accepted: Orders

## 2020-10-14 NOTE — Telephone Encounter (Signed)
Patient called stating that some of her medications were interacting and would like to know if she could be placed on a different antibiotic that doesn't interact with her medications.  Patient had left total hip surgery on Monday, 10/12/2020.  CB# [phone].  Please advise. Thank you.

## 2020-10-14 NOTE — Telephone Encounter (Signed)
I sent doxy

## 2020-10-14 NOTE — Telephone Encounter (Signed)
Spoke with patient who states that antibiotics that she was put on interacts with her current medication and she was anting to know if something else could be called in for her. Patient verbally understood that she would need to give the surgeon that put her on this antibiotic would need to change this for her. Patient states that she would give them a call the see to have this changed.

## 2020-10-15 ENCOUNTER — Telehealth: Payer: Self-pay

## 2020-10-15 NOTE — Telephone Encounter (Signed)
Ok, thanks.

## 2020-10-15 NOTE — Telephone Encounter (Signed)
Yes it is.  It is due to [place] having the twist the leg in order to do the surgery.

## 2020-10-15 NOTE — Telephone Encounter (Signed)
Patient left Vm on Triage phone. States she had L THA 10/12/2020. Sxuhe is having pain on left knee also. Would like to know if this is normal.   CB [phone]

## 2020-10-16 ENCOUNTER — Telehealth: Payer: Self-pay | Admitting: Orthopaedic Surgery

## 2020-10-16 NOTE — Telephone Encounter (Signed)
She was supposed to stop the Bactrim and take the doxycycline.

## 2020-10-16 NOTE — Telephone Encounter (Signed)
Received call from Evelina Dun with [facility] advised patient had a reaction when taking medications Bactrim and Lisinopril. The number to contact Cindee Salt is [phone]

## 2020-10-16 NOTE — Telephone Encounter (Signed)
Patient aware.

## 2020-10-19 IMAGING — DX DG CHEST 2V
2 series · 2 of 2 positions shown · non-contrast
Comparison: 12/30/2019

CLINICAL DATA: History of pneumonia in [place]. Shortness of breath.

EXAM:
CHEST - 2 VIEW

[chest pa]
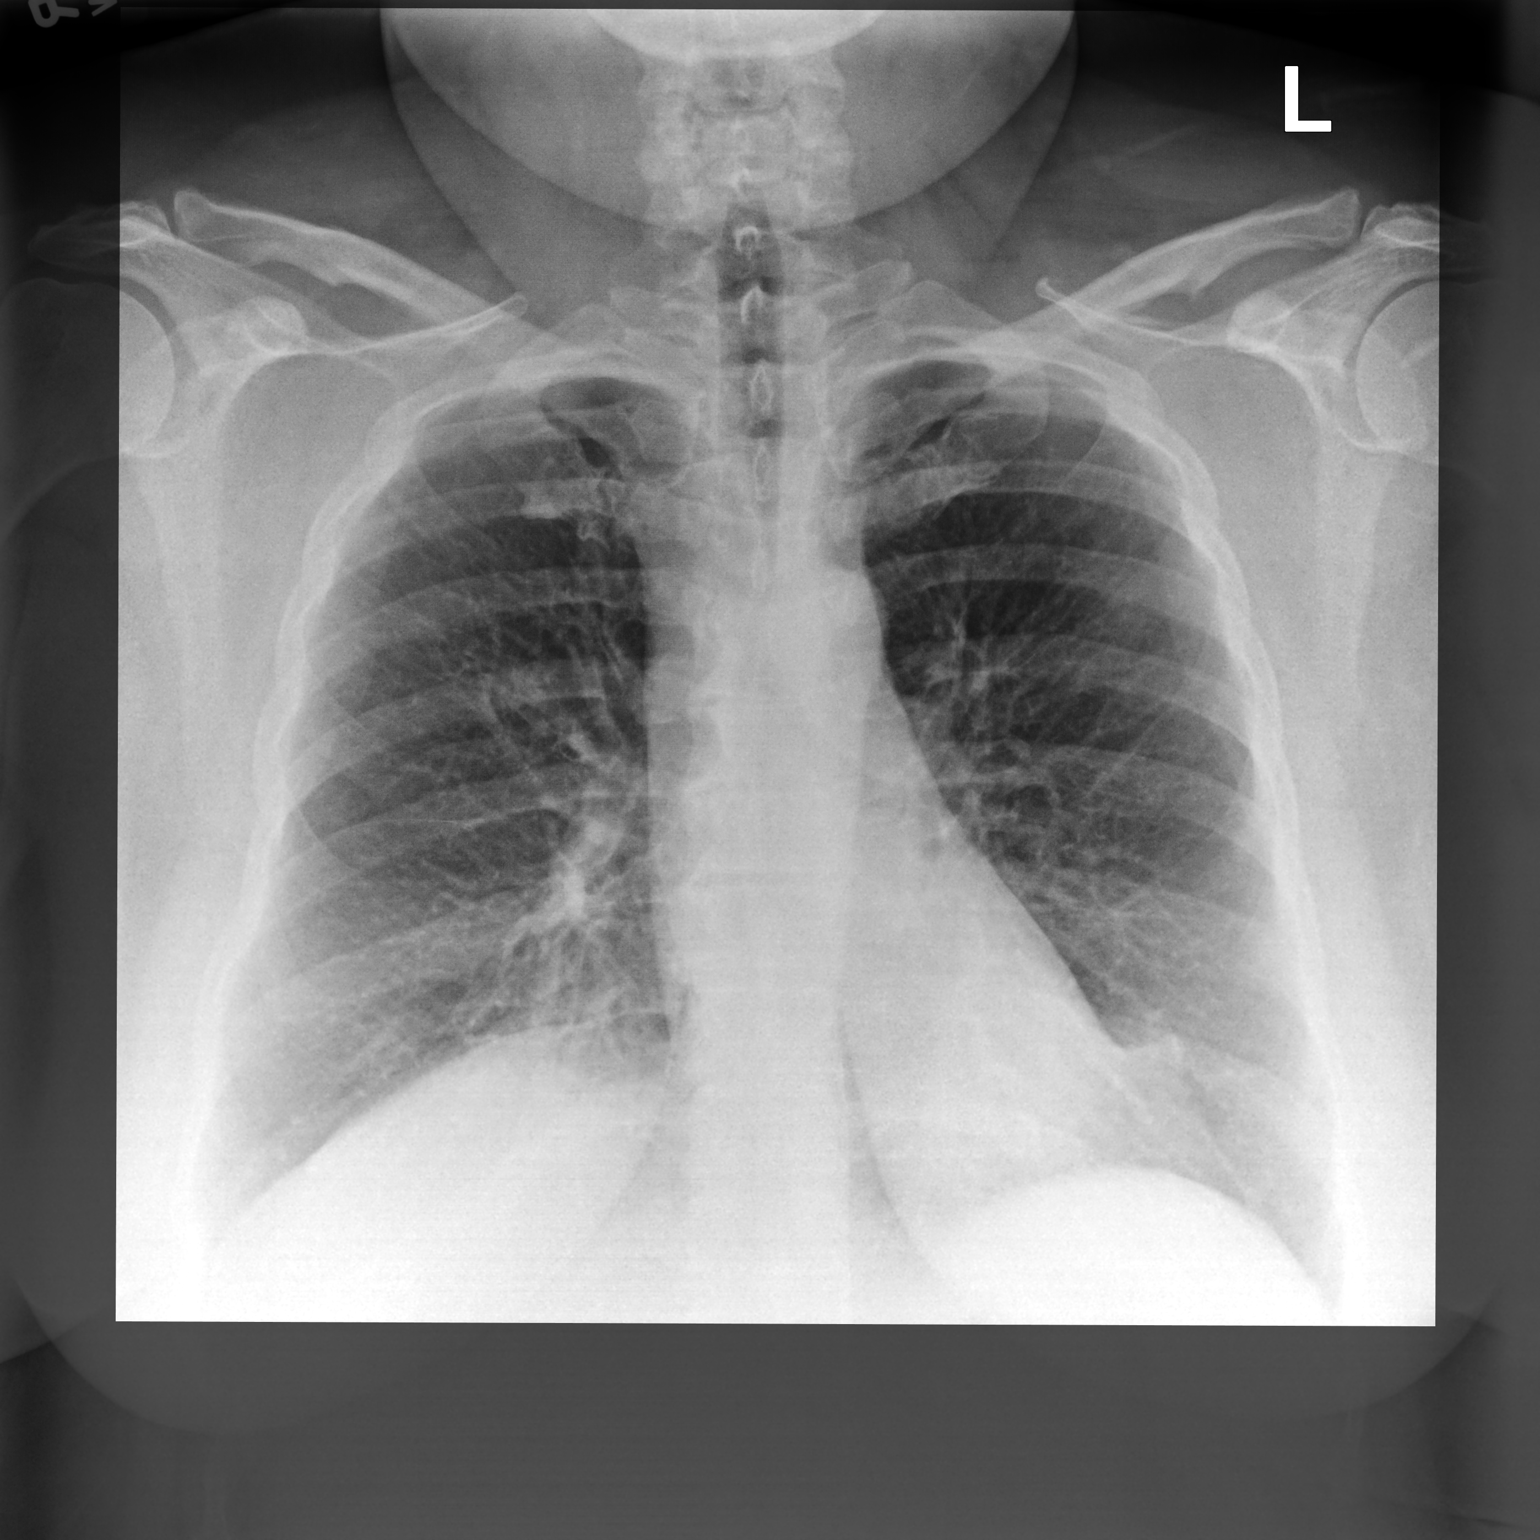

[chest lat]
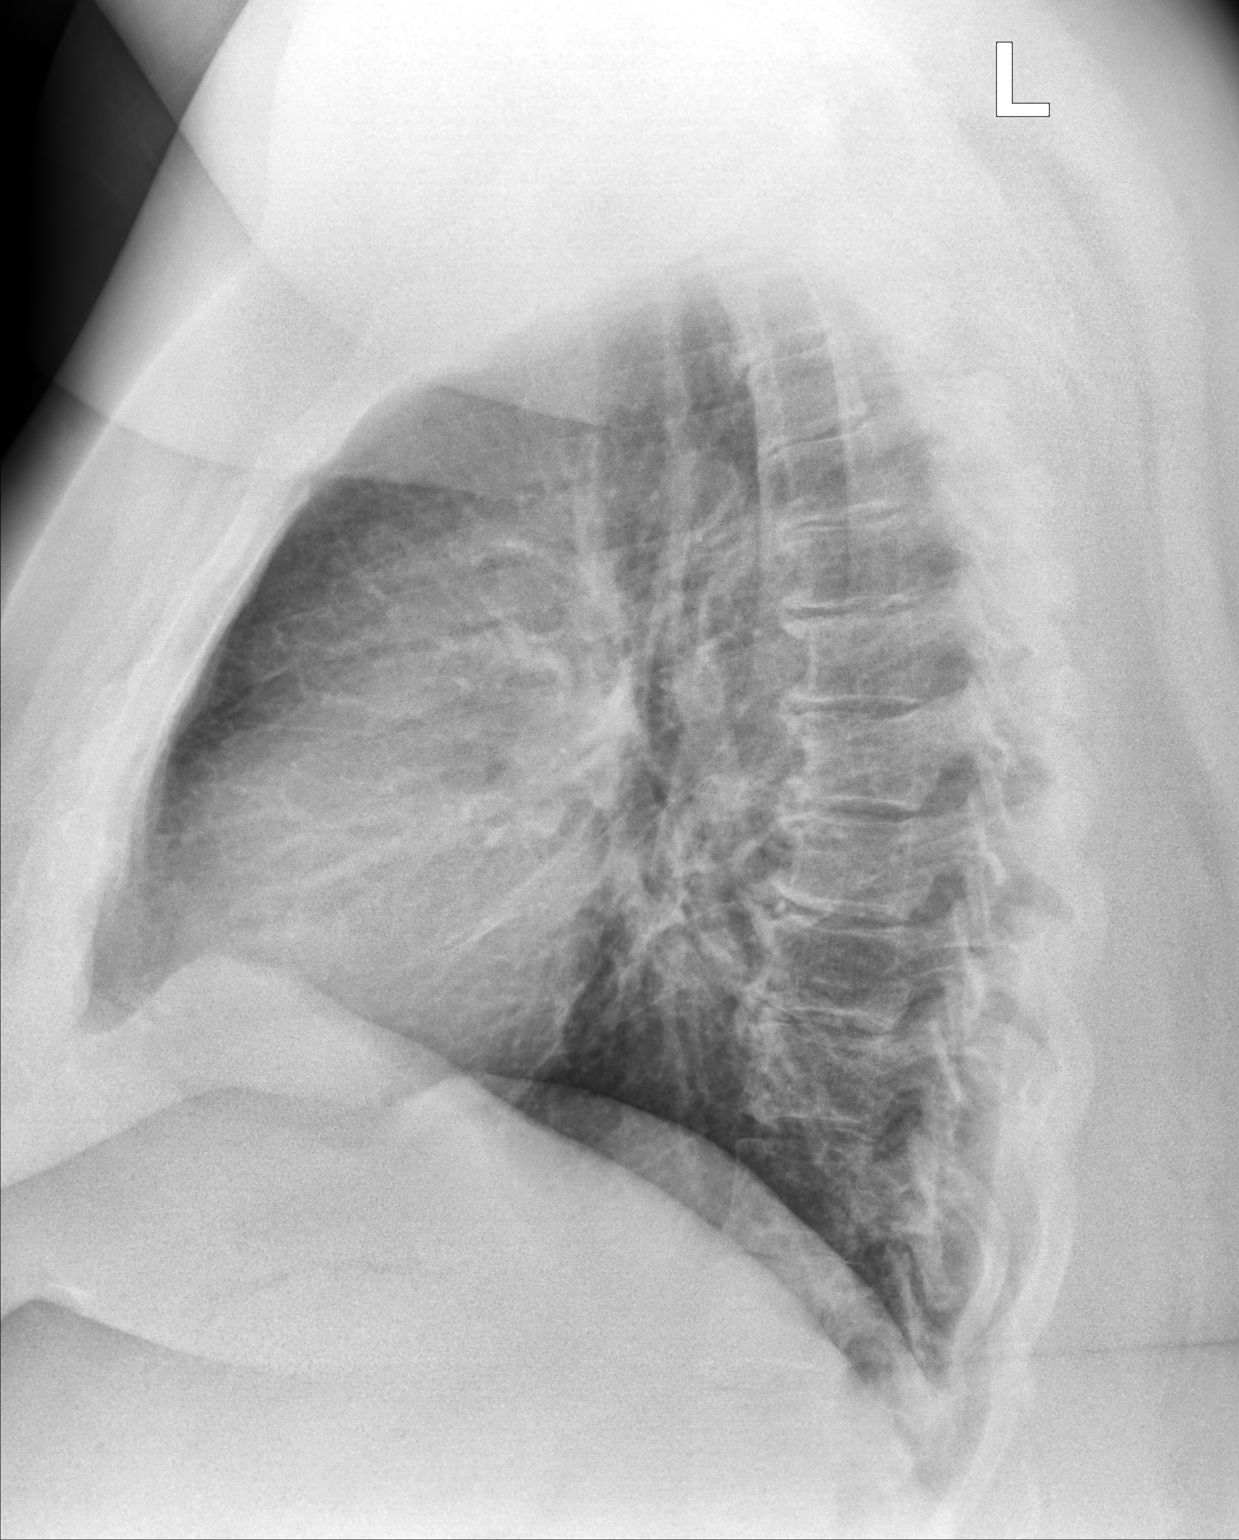

[2 of 2 positions shown; findings below may reference images not displayed]

FINDINGS: There is mild perihilar peribronchial thickening. No focal
consolidations or pleural effusions. Midthoracic degenerative
changes are present.
IMPRESSION: Mild bronchitic changes.

## 2020-10-27 ENCOUNTER — Ambulatory Visit (INDEPENDENT_AMBULATORY_CARE_PROVIDER_SITE_OTHER): Payer: PPO | Admitting: Orthopaedic Surgery

## 2020-10-27 ENCOUNTER — Encounter: Payer: Self-pay | Admitting: Orthopaedic Surgery

## 2020-10-27 ENCOUNTER — Telehealth: Payer: Self-pay | Admitting: Orthopaedic Surgery

## 2020-10-27 DIAGNOSIS — M1612 Unilateral primary osteoarthritis, left hip: Secondary | ICD-10-CM

## 2020-10-27 MED ORDER — METHOCARBAMOL 500 MG PO TABS: 500.0000 mg | ORAL_TABLET | Freq: Two times a day (BID) | ORAL | 3 refills | Status: DC | PRN

## 2020-10-27 MED ORDER — DOXYCYCLINE HYCLATE 100 MG PO TABS: 100.0000 mg | ORAL_TABLET | Freq: Two times a day (BID) | ORAL | 0 refills | Status: AC

## 2020-10-27 NOTE — Telephone Encounter (Signed)
Pt called and informed and stated understanding  °

## 2020-10-27 NOTE — Telephone Encounter (Signed)
Patient called requesting a call back fom Kathlee Nations. Patient wanted Kathlee Nations to ask Dr. Erlinda Hong when can she take off compression socks given to her to were after surgery. Patient phone number is [phone].

## 2020-10-27 NOTE — Telephone Encounter (Signed)
She can stop wearing them now but it's recommended for up to 4 weeks.

## 2020-10-27 NOTE — Progress Notes (Signed)
Post-Op Visit Note   Patient: [name]           Date of Birth: 04-22-1971           MRN: [id number] Visit Date: 10/27/2020 PCP: Libby Maw, MD   Assessment & Plan:  Chief Complaint:  Chief Complaint  Patient presents with  . Left Hip - Routine Post Op   Visit Diagnoses:  1. Primary osteoarthritis of left hip     Plan:   Halah is 2 weeks status post left total hip replacement.  She is doing well overall.  Ambulating with a cane.  She has been discharged by home health PT.  Denies any fevers or chills or any signs of infection.  Left hip surgical incision is healed.  Sutures intact.  Mild swelling.  No evidence of infection.  Small seroma.  Good range of motion of the hip without pain.  Sutures removed Steri-Strips applied.  Implant card provided.  Activities and wound care instructions reviewed with the patient.  We will continue doxycycline for another couple weeks.  Follow-up in 4 weeks with standing AP pelvis x-rays.  Follow-Up Instructions: Return in about 4 weeks (around 11/24/2020).   Orders:  No orders of the defined types were placed in this encounter.  Meds ordered this encounter  Medications  . doxycycline (VIBRA-TABS) 100 MG tablet    Sig: Take 1 tablet (100 mg total) by mouth 2 (two) times daily for 14 days.    Dispense:  28 tablet    Refill:  0  . methocarbamol (ROBAXIN) 500 MG tablet    Sig: Take 1 tablet (500 mg total) by mouth 2 (two) times daily as needed. To be taken after surgery    Dispense:  30 tablet    Refill:  3    Imaging: No results found.  PMFS History: Patient Active Problem List   Diagnosis Date Noted  . Status post total replacement of left hip 10/12/2020  . Type 2 diabetes mellitus with hyperglycemia, without long-term current use of insulin ([place]) 04/02/2020  . Peripheral edema 04/02/2020  . COVID-19 07/09/2019  . Left hip pain 04/30/2019  . Otitis externa of left ear 04/30/2019  . Cholesteatoma of left ear  04/30/2019  . Dysfunction of left eustachian tube 04/30/2019  . Left ear pain 04/30/2019  . Sciatica 04/19/2019  . Class 3 severe obesity due to excess calories with serious comorbidity and body mass index (BMI) of 45.0 to 49.9 in adult ([place]) 04/19/2019  . Post-nasal drip 01/10/2019  . Allergic cough 01/10/2019  . Obstructive sleep apnea syndrome 12/11/2018  . Acute frontal sinusitis 12/10/2018  . Primary osteoarthritis of left hip 11/13/2018  . Morbid obesity ([place]) 11/13/2018  . Elevated cholesterol 11/12/2018  . Essential hypertension 11/12/2018  . Allergic rhinitis due to pollen 11/12/2018  . Reactive airway disease 11/12/2018  . Breast pain, right 07/17/2018  . Prediabetes 07/17/2018  . Refused influenza vaccine 07/17/2018  . Chronic nonintractable headache 02/12/2018  . Dysuria 02/12/2018  . Localized edema 02/12/2018  . Hospital discharge follow-up 01/23/2018  . Bladder problem 01/05/2018  . Depression 01/05/2018  . Insomnia 01/05/2018  . Hepatitis C infection 12/01/2015  . RLS (restless legs syndrome) 10/23/2015  . Elevated liver enzymes 10/22/2015  . Recurrent major depression-severe ([place]) 04/17/2015  . GAD (generalized anxiety disorder) 04/15/2015  . Bipolar depression ([place]) 04/15/2015  . Severe recurrent major depression w/psychotic features, mood-congruent ([place]) 04/14/2015  . Anxiety disorder 04/14/2015  . Major depressive disorder,  recurrent severe without psychotic features ([place]) 04/14/2015  . Suicidal ideations   . Chronic LBP 01/02/2015  . Adult hypothyroidism 06/25/2012  . Cobalamin deficiency 06/25/2012  . Fatigue 06/25/2012  . Chronic arthralgias of knees and hips 06/25/2012  . Leukocytosis 06/25/2012  . Vitamin D deficiency 06/25/2012   Past Medical History:  Diagnosis Date  . ADHD (attention deficit hyperactivity disorder)   . Anemia    with pregnancy  . Anxiety   . Arthritis   . Bipolar disorder ([place])   . Chronic back pain greater than 3 months  duration   . Depression   . Diabetes mellitus without complication ([place])   . Diabetic nephropathy ([place])    bilateral feet  . Dysuria    has to have self in and out cath   . Eroded bladder suspension mesh ([place])   . GERD (gastroesophageal reflux disease)   . Hepatitis    C  . History of COVID-19 11/2019  . History of MRSA infection    after back surgery  . History of sleep apnea    resolved after weight loss  . Hypertension   . Hypothyroidism   . Leg pain, bilateral   . Obesity   . Obsessive-compulsive disorder   . Pneumonia 12/24/2019  . Rosacea   . Sciatic nerve pain   . Social anxiety disorder   . Squamous cell carcinoma of back 2021    Family History  Problem Relation Age of Onset  . Schizophrenia Father   . Schizophrenia Cousin   . Hyperthyroidism Mother   . Hypothyroidism Maternal Grandmother     Past Surgical History:  Procedure Laterality Date  . BACK SURGERY     total of 6 back surgeries  . bladder mesh  2009  . DILITATION & CURRETTAGE/HYSTROSCOPY WITH NOVASURE ABLATION N/A 08/19/2016   Procedure: DILATATION & CURETTAGE WITH NOVASURE ABLATION;  Surgeon: [name], MD;  Location: [facility];  Service: Gynecology;  Laterality: N/A;  . EUS N/A 09/09/2015   Procedure: UPPER ENDOSCOPIC ULTRASOUND (EUS) RADIAL;  Surgeon: [name], MD;  Location: [facility];  Service: Endoscopy;  Laterality: N/A;  . EYE SURGERY    . INCONTINENCE SURGERY    . SPINAL CORD STIMULATOR INSERTION    . SPINAL CORD STIMULATOR REMOVAL    . SPINAL FUSION  2008   times two  . TOTAL HIP ARTHROPLASTY Left 10/12/2020   Procedure: LEFT TOTAL HIP ARTHROPLASTY ANTERIOR APPROACH;  Surgeon: Leandrew Koyanagi, MD;  Location: [facility];  Service: Orthopedics;  Laterality: Left;  . TUBAL LIGATION  2006   Social History   Occupational History  . Not on file  Tobacco Use  . Smoking status: Light Tobacco Smoker    Packs/day: 1.00    Years: 10.00    Pack years: 10.00    Types: Cigarettes    Last  attempt to quit: 02/09/2016    Years since quitting: 4.7  . Smokeless tobacco: Never Used  . Tobacco comment: use vapor, social  Vaping Use  . Vaping Use: Never used  Substance and Sexual Activity  . Alcohol use: No  . Drug use: No  . Sexual activity: Yes    Birth control/protection: Surgical

## 2020-10-27 NOTE — Telephone Encounter (Signed)
Will call.

## 2020-10-30 ENCOUNTER — Other Ambulatory Visit: Payer: Self-pay | Admitting: Family Medicine

## 2020-10-30 DIAGNOSIS — E039 Hypothyroidism, unspecified: Secondary | ICD-10-CM

## 2020-11-03 DIAGNOSIS — F3132 Bipolar disorder, current episode depressed, moderate: Secondary | ICD-10-CM | POA: Diagnosis not present

## 2020-11-03 DIAGNOSIS — F429 Obsessive-compulsive disorder, unspecified: Secondary | ICD-10-CM | POA: Diagnosis not present

## 2020-11-03 DIAGNOSIS — F411 Generalized anxiety disorder: Secondary | ICD-10-CM | POA: Diagnosis not present

## 2020-11-03 DIAGNOSIS — F900 Attention-deficit hyperactivity disorder, predominantly inattentive type: Secondary | ICD-10-CM | POA: Diagnosis not present

## 2020-11-04 ENCOUNTER — Telehealth: Payer: Self-pay

## 2020-11-04 NOTE — Telephone Encounter (Signed)
She states no but thinks there is still a stitch left. I advised her to come in tomorrow as a nurse visit to look at it and if stitch is there we will take out.

## 2020-11-04 NOTE — Telephone Encounter (Signed)
Ok that sounds good

## 2020-11-04 NOTE — Telephone Encounter (Signed)
Is there any redness or evidence of infection?

## 2020-11-04 NOTE — Telephone Encounter (Signed)
Patient called stating that she has a seroma underneath her incision that is hard now.  Patient had left hip surgery on 10/12/2020.  Would like a call back to discuss?  CB# [phone].  Please advise.  Thank you.

## 2020-11-05 ENCOUNTER — Other Ambulatory Visit: Payer: Self-pay

## 2020-11-05 ENCOUNTER — Ambulatory Visit: Payer: PPO

## 2020-11-05 DIAGNOSIS — I10 Essential (primary) hypertension: Secondary | ICD-10-CM | POA: Diagnosis not present

## 2020-11-05 DIAGNOSIS — Z79899 Other long term (current) drug therapy: Secondary | ICD-10-CM | POA: Diagnosis not present

## 2020-11-05 DIAGNOSIS — M25552 Pain in left hip: Secondary | ICD-10-CM | POA: Diagnosis not present

## 2020-11-05 DIAGNOSIS — E119 Type 2 diabetes mellitus without complications: Secondary | ICD-10-CM | POA: Diagnosis not present

## 2020-11-24 ENCOUNTER — Ambulatory Visit (INDEPENDENT_AMBULATORY_CARE_PROVIDER_SITE_OTHER): Payer: PPO

## 2020-11-24 ENCOUNTER — Ambulatory Visit (INDEPENDENT_AMBULATORY_CARE_PROVIDER_SITE_OTHER): Payer: PPO | Admitting: Orthopaedic Surgery

## 2020-11-24 DIAGNOSIS — M1612 Unilateral primary osteoarthritis, left hip: Secondary | ICD-10-CM | POA: Diagnosis not present

## 2020-11-24 NOTE — Progress Notes (Signed)
Post-Op Visit Note   Patient: [name]           Date of Birth: 01-07-1971           MRN: [id number] Visit Date: 11/24/2020 PCP: Libby Maw, MD   Assessment & Plan:  Chief Complaint:  Chief Complaint  Patient presents with  . Left Hip - Pain   Visit Diagnoses:  1. Primary osteoarthritis of left hip     Plan:   Delailah is 6 weeks status post left total hip replacement.  Doing well overall.  Continues to do home exercises.  No real complaints.  Takes occasional oxycodone for breakthrough pain.  Left hip shows a fully healed surgical scar.  No signs of infection.  He is doing well for her 6-week follow-up.  She will continue to increase activity as tolerated.  She will continue with home exercises for strengthening.  We will recheck her in 6 weeks  Follow-Up Instructions: Return in about 6 weeks (around 01/05/2021).   Orders:  Orders Placed This Encounter  Procedures  . XR Pelvis 1-2 Views   No orders of the defined types were placed in this encounter.   Imaging: XR Pelvis 1-2 Views  Result Date: 11/24/2020 Stable total hip replacement without complications   PMFS History: Patient Active Problem List   Diagnosis Date Noted  . Status post total replacement of left hip 10/12/2020  . Type 2 diabetes mellitus with hyperglycemia, without long-term current use of insulin ([place]) 04/02/2020  . Peripheral edema 04/02/2020  . COVID-19 07/09/2019  . Left hip pain 04/30/2019  . Otitis externa of left ear 04/30/2019  . Cholesteatoma of left ear 04/30/2019  . Dysfunction of left eustachian tube 04/30/2019  . Left ear pain 04/30/2019  . Sciatica 04/19/2019  . Class 3 severe obesity due to excess calories with serious comorbidity and body mass index (BMI) of 45.0 to 49.9 in adult ([place]) 04/19/2019  . Post-nasal drip 01/10/2019  . Allergic cough 01/10/2019  . Obstructive sleep apnea syndrome 12/11/2018  . Acute frontal sinusitis 12/10/2018  . Primary  osteoarthritis of left hip 11/13/2018  . Morbid obesity ([place]) 11/13/2018  . Elevated cholesterol 11/12/2018  . Essential hypertension 11/12/2018  . Allergic rhinitis due to pollen 11/12/2018  . Reactive airway disease 11/12/2018  . Breast pain, right 07/17/2018  . Prediabetes 07/17/2018  . Refused influenza vaccine 07/17/2018  . Chronic nonintractable headache 02/12/2018  . Dysuria 02/12/2018  . Localized edema 02/12/2018  . Hospital discharge follow-up 01/23/2018  . Bladder problem 01/05/2018  . Depression 01/05/2018  . Insomnia 01/05/2018  . Hepatitis C infection 12/01/2015  . RLS (restless legs syndrome) 10/23/2015  . Elevated liver enzymes 10/22/2015  . Recurrent major depression-severe ([place]) 04/17/2015  . GAD (generalized anxiety disorder) 04/15/2015  . Bipolar depression ([place]) 04/15/2015  . Severe recurrent major depression w/psychotic features, mood-congruent ([place]) 04/14/2015  . Anxiety disorder 04/14/2015  . Major depressive disorder, recurrent severe without psychotic features ([place]) 04/14/2015  . Suicidal ideations   . Chronic LBP 01/02/2015  . Adult hypothyroidism 06/25/2012  . Cobalamin deficiency 06/25/2012  . Fatigue 06/25/2012  . Chronic arthralgias of knees and hips 06/25/2012  . Leukocytosis 06/25/2012  . Vitamin D deficiency 06/25/2012   Past Medical History:  Diagnosis Date  . ADHD (attention deficit hyperactivity disorder)   . Anemia    with pregnancy  . Anxiety   . Arthritis   . Bipolar disorder ([place])   . Chronic back pain greater than 3  months duration   . Depression   . Diabetes mellitus without complication ([place])   . Diabetic nephropathy ([place])    bilateral feet  . Dysuria    has to have self in and out cath   . Eroded bladder suspension mesh ([place])   . GERD (gastroesophageal reflux disease)   . Hepatitis    C  . History of COVID-19 11/2019  . History of MRSA infection    after back surgery  . History of sleep apnea    resolved after  weight loss  . Hypertension   . Hypothyroidism   . Leg pain, bilateral   . Obesity   . Obsessive-compulsive disorder   . Pneumonia 12/24/2019  . Rosacea   . Sciatic nerve pain   . Social anxiety disorder   . Squamous cell carcinoma of back 2021    Family History  Problem Relation Age of Onset  . Schizophrenia Father   . Schizophrenia Cousin   . Hyperthyroidism Mother   . Hypothyroidism Maternal Grandmother     Past Surgical History:  Procedure Laterality Date  . BACK SURGERY     total of 6 back surgeries  . bladder mesh  2009  . DILITATION & CURRETTAGE/HYSTROSCOPY WITH NOVASURE ABLATION N/A 08/19/2016   Procedure: DILATATION & CURETTAGE WITH NOVASURE ABLATION;  Surgeon: [name], MD;  Location: [facility];  Service: Gynecology;  Laterality: N/A;  . EUS N/A 09/09/2015   Procedure: UPPER ENDOSCOPIC ULTRASOUND (EUS) RADIAL;  Surgeon: [name], MD;  Location: [facility];  Service: Endoscopy;  Laterality: N/A;  . EYE SURGERY    . INCONTINENCE SURGERY    . SPINAL CORD STIMULATOR INSERTION    . SPINAL CORD STIMULATOR REMOVAL    . SPINAL FUSION  2008   times two  . TOTAL HIP ARTHROPLASTY Left 10/12/2020   Procedure: LEFT TOTAL HIP ARTHROPLASTY ANTERIOR APPROACH;  Surgeon: Leandrew Koyanagi, MD;  Location: [facility];  Service: Orthopedics;  Laterality: Left;  . TUBAL LIGATION  2006   Social History   Occupational History  . Not on file  Tobacco Use  . Smoking status: Light Tobacco Smoker    Packs/day: 1.00    Years: 10.00    Pack years: 10.00    Types: Cigarettes    Last attempt to quit: 02/09/2016    Years since quitting: 4.7  . Smokeless tobacco: Never Used  . Tobacco comment: use vapor, social  Vaping Use  . Vaping Use: Never used  Substance and Sexual Activity  . Alcohol use: No  . Drug use: No  . Sexual activity: Yes    Birth control/protection: Surgical

## 2020-12-01 DIAGNOSIS — Z6841 Body Mass Index (BMI) 40.0 and over, adult: Secondary | ICD-10-CM | POA: Diagnosis not present

## 2020-12-07 ENCOUNTER — Telehealth: Payer: Self-pay | Admitting: Family

## 2020-12-07 ENCOUNTER — Other Ambulatory Visit: Payer: Self-pay | Admitting: Physician Assistant

## 2020-12-07 DIAGNOSIS — I10 Essential (primary) hypertension: Secondary | ICD-10-CM | POA: Diagnosis not present

## 2020-12-07 DIAGNOSIS — M5430 Sciatica, unspecified side: Secondary | ICD-10-CM

## 2020-12-07 DIAGNOSIS — Z79899 Other long term (current) drug therapy: Secondary | ICD-10-CM | POA: Diagnosis not present

## 2020-12-07 DIAGNOSIS — M25552 Pain in left hip: Secondary | ICD-10-CM | POA: Diagnosis not present

## 2020-12-08 NOTE — Telephone Encounter (Signed)
Pt call in Return ph # [phone]  gabapentin (NEURONTIN) 300 MG capsule - pt has only 2 days left  [facility] [place], [place] - [address] AT [facility]

## 2020-12-09 NOTE — Telephone Encounter (Signed)
Patient notified VIA phone that RX was sent by [facility] to pharmacy.  She will call pharmacy. Dm/cma

## 2020-12-15 ENCOUNTER — Other Ambulatory Visit: Payer: Self-pay

## 2020-12-16 ENCOUNTER — Ambulatory Visit (INDEPENDENT_AMBULATORY_CARE_PROVIDER_SITE_OTHER): Payer: PPO | Admitting: Family Medicine

## 2020-12-16 ENCOUNTER — Encounter: Payer: Self-pay | Admitting: Family Medicine

## 2020-12-16 VITALS — BP 108/68 | HR 84 | Temp 97.6°F | Ht 65.0 in | Wt 226.6 lb

## 2020-12-16 DIAGNOSIS — R319 Hematuria, unspecified: Secondary | ICD-10-CM | POA: Diagnosis not present

## 2020-12-16 DIAGNOSIS — E039 Hypothyroidism, unspecified: Secondary | ICD-10-CM | POA: Diagnosis not present

## 2020-12-16 DIAGNOSIS — E1165 Type 2 diabetes mellitus with hyperglycemia: Secondary | ICD-10-CM | POA: Diagnosis not present

## 2020-12-16 DIAGNOSIS — I10 Essential (primary) hypertension: Secondary | ICD-10-CM

## 2020-12-16 DIAGNOSIS — E7800 Pure hypercholesterolemia, unspecified: Secondary | ICD-10-CM

## 2020-12-16 LAB — URINALYSIS, ROUTINE W REFLEX MICROSCOPIC
Bilirubin Urine: NEGATIVE
Ketones, ur: NEGATIVE
Leukocytes,Ua: NEGATIVE
Nitrite: NEGATIVE
Specific Gravity, Urine: <=1.005 — AB (ref 1.000–1.030)
Total Protein, Urine: NEGATIVE
Urine Glucose: NEGATIVE
Urobilinogen, UA: 0.2 (ref 0.0–1.0)
pH: 5 (ref 5.0–8.0)

## 2020-12-16 LAB — COMPREHENSIVE METABOLIC PANEL
ALT: 17 U/L (ref 0–35)
AST: 11 U/L (ref 0–37)
Albumin: 3.9 g/dL (ref 3.5–5.2)
Alkaline Phosphatase: 87 U/L (ref 39–117)
BUN: 17 mg/dL (ref 6–23)
CO2: 30 mEq/L (ref 19–32)
Calcium: 9.8 mg/dL (ref 8.4–10.5)
Chloride: 99 mEq/L (ref 96–112)
Creatinine, Ser: 0.69 mg/dL (ref 0.40–1.20)
GFR: 102.93 mL/min (ref >60.00)
Glucose, Bld: 89 mg/dL (ref 70–99)
Potassium: 4.5 mEq/L (ref 3.5–5.1)
Sodium: 136 mEq/L (ref 135–145)
Total Bilirubin: 0.3 mg/dL (ref 0.2–1.2)
Total Protein: 6.6 g/dL (ref 6.0–8.3)

## 2020-12-16 LAB — LDL CHOLESTEROL, DIRECT: Direct LDL: 93 mg/dL

## 2020-12-16 LAB — MICROALBUMIN / CREATININE URINE RATIO
Creatinine,U: 14.1 mg/dL
Microalb Creat Ratio: 5 mg/g (ref 0.0–30.0)
Microalb, Ur: <0.7 mg/dL (ref 0.0–1.9)

## 2020-12-16 LAB — CBC
HCT: 40.8 % (ref 36.0–46.0)
Hemoglobin: 13.4 g/dL (ref 12.0–15.0)
MCHC: 32.8 g/dL (ref 30.0–36.0)
MCV: 78.2 fl (ref 78.0–100.0)
Platelets: 317 10*3/uL (ref 150.0–400.0)
RBC: 5.21 Mil/uL — ABNORMAL HIGH (ref 3.87–5.11)
RDW: 17.8 % — ABNORMAL HIGH (ref 11.5–15.5)
WBC: 10.6 10*3/uL — ABNORMAL HIGH (ref 4.0–10.5)

## 2020-12-16 LAB — HEMOGLOBIN A1C: Hgb A1c MFr Bld: 5.5 % (ref 4.6–6.5)

## 2020-12-16 LAB — TSH: TSH: 1.78 u[IU]/mL (ref 0.35–4.50)

## 2020-12-16 NOTE — Progress Notes (Addendum)
Established Patient Office Visit  Subjective:  Patient ID: [name], female    DOB: 1970/09/29  Age: 50 y.o. MRN: [id number]  CC:  Chief Complaint  Patient presents with  . Follow-up    Follow up on diabetes, no concerns. Patient not fasting.     HPI [name] presents for follow-up of diabetes, hypertension, elevated cholesterol and hypothyroidism.  Patient has lost 70 pounds she tells me.  She is taking semaglutide that has helped with weight loss as well.  Status post hip replacement 1 February.  Seems to be doing well.  She is seeing pain management in [facility].  Last eye check was 6 months ago.  No diabetic neuropathy.  Past Medical History:  Diagnosis Date  . ADHD (attention deficit hyperactivity disorder)   . Anemia    with pregnancy  . Anxiety   . Arthritis   . Bipolar disorder ([place])   . Chronic back pain greater than 3 months duration   . Depression   . Diabetes mellitus without complication ([place])   . Diabetic nephropathy ([place])    bilateral feet  . Dysuria    has to have self in and out cath   . Eroded bladder suspension mesh ([place])   . GERD (gastroesophageal reflux disease)   . Hepatitis    C  . History of COVID-19 11/2019  . History of MRSA infection    after back surgery  . History of sleep apnea    resolved after weight loss  . Hypertension   . Hypothyroidism   . Leg pain, bilateral   . Obesity   . Obsessive-compulsive disorder   . Pneumonia 12/24/2019  . Rosacea   . Sciatic nerve pain   . Social anxiety disorder   . Squamous cell carcinoma of back 2021    Past Surgical History:  Procedure Laterality Date  . BACK SURGERY     total of 6 back surgeries  . bladder mesh  2009  . DILITATION & CURRETTAGE/HYSTROSCOPY WITH NOVASURE ABLATION N/A 08/19/2016   Procedure: DILATATION & CURETTAGE WITH NOVASURE ABLATION;  Surgeon: [name], MD;  Location: [facility];  Service: Gynecology;  Laterality: N/A;  . EUS N/A 09/09/2015   Procedure: UPPER  ENDOSCOPIC ULTRASOUND (EUS) RADIAL;  Surgeon: [name], MD;  Location: [facility];  Service: Endoscopy;  Laterality: N/A;  . EYE SURGERY    . INCONTINENCE SURGERY    . SPINAL CORD STIMULATOR INSERTION    . SPINAL CORD STIMULATOR REMOVAL    . SPINAL FUSION  2008   times two  . TOTAL HIP ARTHROPLASTY Left 10/12/2020   Procedure: LEFT TOTAL HIP ARTHROPLASTY ANTERIOR APPROACH;  Surgeon: Leandrew Koyanagi, MD;  Location: [facility];  Service: Orthopedics;  Laterality: Left;  . TUBAL LIGATION  2006    Family History  Problem Relation Age of Onset  . Schizophrenia Father   . Schizophrenia Cousin   . Hyperthyroidism Mother   . Hypothyroidism Maternal Grandmother     Social History   Socioeconomic History  . Marital status: Legally Separated    Spouse name: Not on file  . Number of children: Not on file  . Years of education: Not on file  . Highest education level: Not on file  Occupational History  . Not on file  Tobacco Use  . Smoking status: Light Tobacco Smoker    Packs/day: 1.00    Years: 10.00    Pack years: 10.00    Types: Cigarettes  Last attempt to quit: 02/09/2016    Years since quitting: 4.8  . Smokeless tobacco: Never Used  . Tobacco comment: use vapor, social  Vaping Use  . Vaping Use: Never used  Substance and Sexual Activity  . Alcohol use: No  . Drug use: No  . Sexual activity: Yes    Birth control/protection: Surgical  Other Topics Concern  . Not on file  Social History Narrative  . Not on file   Social Determinants of Health   Financial Resource Strain: Medium Risk  . Difficulty of Paying Living Expenses: Somewhat hard  Food Insecurity: Food Insecurity Present  . Worried About Charity fundraiser in the Last Year: Sometimes true  . Ran Out of Food in the Last Year: Never true  Transportation Needs: Unmet Transportation Needs  . Lack of Transportation (Medical): Yes  . Lack of Transportation (Non-Medical): No  Physical Activity: Insufficiently  Active  . Days of Exercise per Week: 3 days  . Minutes of Exercise per Session: 20 min  Stress: No Stress Concern Present  . Feeling of Stress : Only a little  Social Connections: Socially Isolated  . Frequency of Communication with Friends and Family: More than three times a week  . Frequency of Social Gatherings with Friends and Family: More than three times a week  . Attends Religious Services: Never  . Active Member of Clubs or Organizations: No  . Attends Archivist Meetings: Never  . Marital Status: Separated  Intimate Partner Violence: Not At Risk  . Fear of Current or Ex-Partner: No  . Emotionally Abused: No  . Physically Abused: No  . Sexually Abused: No    Outpatient Medications Prior to Visit  Medication Sig Dispense Refill  . ALPRAZolam (XANAX) 0.5 MG tablet Take 0.5 mg by mouth 2 (two) times daily as needed for anxiety.    . ARIPiprazole (ABILIFY) 5 MG tablet Take 1 tablet (5 mg total) by mouth daily. 30 tablet 2  . atorvastatin (LIPITOR) 20 MG tablet TAKE 1 TABLET(20 MG) BY MOUTH DAILY (Patient taking differently: Take 20 mg by mouth daily. TAKE 1 TABLET(20 MG) BY MOUTH DAILY) 100 tablet 3  . blood glucose meter kit and supplies KIT Dispense based on patient and insurance preference. Check glucose before breakfast. ICD: E11.65 1 each 0  . clomiPRAMINE (ANAFRANIL) 50 MG capsule Take 100 mg by mouth at bedtime.    Marland Kitchen FINACEA 15 % FOAM Apply 1 application topically 2 (two) times daily as needed (rosacea).    . gabapentin (NEURONTIN) 300 MG capsule TAKE 1 CAPSULE(300 MG) BY MOUTH THREE TIMES DAILY 90 capsule 2  . gabapentin (NEURONTIN) 300 MG capsule TAKE 1 CAPSULE(300 MG) BY MOUTH THREE TIMES DAILY 90 capsule 2  . glucose blood test strip Use as instructed, check glucose before breakfast. ICD: E11.65 100 each 12  . lamoTRIgine (LAMICTAL) 150 MG tablet Take 150 mg by mouth 2 (two) times daily.    . Lancets (FREESTYLE) lancets Use as instructed. E11.65 100 each 12  .  levothyroxine (SYNTHROID) 75 MCG tablet Take 1 tablet (75 mcg total) by mouth daily before breakfast. **Needs office visit** 30 tablet 0  . lisinopril (ZESTRIL) 10 MG tablet TAKE 1 TABLET(10 MG) BY MOUTH DAILY (Patient taking differently: Take 10 mg by mouth daily. TAKE 1 TABLET(10 MG) BY MOUTH DAILY) 100 tablet 1  . metFORMIN (GLUCOPHAGE) 500 MG tablet Take 1 tablet (500 mg total) by mouth 2 (two) times daily with a meal. 60 tablet  5  . methylphenidate (RITALIN) 20 MG tablet Take 20 mg by mouth 2 (two) times daily.    Marland Kitchen omeprazole (PRILOSEC) 20 MG capsule Take 20 mg by mouth as needed.    Marland Kitchen OVER THE COUNTER MEDICATION Take 2 tablets by mouth every 6 (six) hours as needed (headache). Headache Relief    . oxyCODONE-acetaminophen (PERCOCET) 10-325 MG tablet Take 1 tablet by mouth every 6 (six) hours as needed for pain. 40 tablet 0  . Semaglutide,0.25 or 0.5MG/DOS, 2 MG/1.5ML SOPN Inject 0.25 mg into the skin every Thursday.    . sulfamethoxazole-trimethoprim (BACTRIM DS) 800-160 MG tablet Take 1 tablet by mouth 2 (two) times daily. To be taken after surgery 20 tablet 0  . aspirin EC 81 MG tablet Take 1 tablet (81 mg total) by mouth 2 (two) times daily. To be taken after surgery (Patient not taking: Reported on 12/16/2020) 84 tablet 0  . docusate sodium (COLACE) 100 MG capsule Take 1 capsule (100 mg total) by mouth daily as needed. (Patient not taking: Reported on 12/16/2020) 30 capsule 2  . methocarbamol (ROBAXIN) 500 MG tablet Take 1 tablet (500 mg total) by mouth 2 (two) times daily as needed. To be taken after surgery (Patient not taking: Reported on 12/16/2020) 30 tablet 3  . ondansetron (ZOFRAN) 4 MG tablet Take 1 tablet (4 mg total) by mouth every 8 (eight) hours as needed for nausea or vomiting. (Patient not taking: Reported on 12/16/2020) 40 tablet 0  . phentermine 15 MG capsule Take by mouth.     No facility-administered medications prior to visit.    Allergies  Allergen Reactions  .  Pregabalin Anaphylaxis  . Celebrex [Celecoxib] Nausea And Vomiting    Stomach pain  . Effexor [Venlafaxine Hcl] Other (See Comments)    Serotonin toxicity  . Mirapex [Pramipexole]     Mirapex- Worsening "body jerks"  . Pristiq [Desvenlafaxine] Other (See Comments)    Serotonin toxicity   . Prozac [Fluoxetine Hcl] Swelling  . Symbyax [Olanzapine-Fluoxetine Hcl] Swelling  . Wellbutrin [Bupropion Hcl] Swelling    ROS Review of Systems  Constitutional: Negative.   HENT: Negative.   Eyes: Negative for photophobia and visual disturbance.  Respiratory: Negative.   Cardiovascular: Negative.   Gastrointestinal: Negative.   Endocrine: Negative for polyphagia and polyuria.  Genitourinary: Negative for difficulty urinating, frequency and urgency.  Musculoskeletal: Positive for arthralgias.  Neurological: Negative for speech difficulty and weakness.  Hematological: Does not bruise/bleed easily.  Psychiatric/Behavioral: Negative.       Objective:    Physical Exam Vitals and nursing note reviewed.  Constitutional:      General: She is not in acute distress.    Appearance: Normal appearance. She is not ill-appearing, toxic-appearing or diaphoretic.  HENT:     Head: Normocephalic and atraumatic.     Right Ear: Tympanic membrane, ear canal and external ear normal.     Left Ear: Tympanic membrane, ear canal and external ear normal.     Mouth/Throat:     Mouth: Mucous membranes are dry.     Pharynx: No oropharyngeal exudate or posterior oropharyngeal erythema.  Eyes:     General: No scleral icterus.       Right eye: No discharge.        Left eye: No discharge.     Extraocular Movements: Extraocular movements intact.     Conjunctiva/sclera: Conjunctivae normal.     Pupils: Pupils are equal, round, and reactive to light.  Cardiovascular:     Rate  and Rhythm: Normal rate and regular rhythm.  Pulmonary:     Effort: Pulmonary effort is normal.     Breath sounds: Normal breath sounds.   Musculoskeletal:     Cervical back: No rigidity or tenderness.  Lymphadenopathy:     Cervical: No cervical adenopathy.  Skin:    General: Skin is warm and dry.  Neurological:     Mental Status: She is alert and oriented to person, place, and time.  Psychiatric:        Mood and Affect: Mood normal.        Behavior: Behavior normal.     BP 108/68   Pulse 84   Temp 97.6 F (36.4 C) (Temporal)   Ht 5' 5" (1.651 m)   Wt 226 lb 9.6 oz (102.8 kg)   SpO2 96%   BMI 37.71 kg/m  Wt Readings from Last 3 Encounters:  12/16/20 226 lb 9.6 oz (102.8 kg)  10/12/20 233 lb (105.7 kg)  10/08/20 233 lb (105.7 kg)   Diabetic Foot Exam - Simple   Simple Foot Form Diabetic Foot exam was performed with the following findings: Yes 12/16/2020 11:32 AM  Visual Inspection No deformities, no ulcerations, no other skin breakdown bilaterally: Yes Sensation Testing Intact to touch and monofilament testing bilaterally: Yes Pulse Check Posterior Tibialis and Dorsalis pulse intact bilaterally: Yes Comments     Health Maintenance Due  Topic Date Due  . HIV Screening  Never done  . COLONOSCOPY (Pts 45-11yr Insurance coverage will need to be confirmed)  Never done    There are no preventive care reminders to display for this patient.  Lab Results  Component Value Date   TSH 1.78 12/16/2020   Lab Results  Component Value Date   WBC 10.6 (H) 12/16/2020   HGB 13.4 12/16/2020   HCT 40.8 12/16/2020   MCV 78.2 12/16/2020   PLT 317.0 12/16/2020   Lab Results  Component Value Date   NA 136 12/16/2020   K 4.5 12/16/2020   CO2 30 12/16/2020   GLUCOSE 89 12/16/2020   BUN 17 12/16/2020   CREATININE 0.69 12/16/2020   BILITOT 0.3 12/16/2020   ALKPHOS 87 12/16/2020   AST 11 12/16/2020   ALT 17 12/16/2020   PROT 6.6 12/16/2020   ALBUMIN 3.9 12/16/2020   CALCIUM 9.8 12/16/2020   ANIONGAP 8 10/13/2020   GFR 102.93 12/16/2020   Lab Results  Component Value Date   CHOL 166 11/12/2018   Lab  Results  Component Value Date   HDL 35.80 (L) 11/12/2018   Lab Results  Component Value Date   LDLCALC 174 (H) 04/15/2015   Lab Results  Component Value Date   TRIG 221.0 (H) 11/12/2018   Lab Results  Component Value Date   CHOLHDL 5 11/12/2018   Lab Results  Component Value Date   HGBA1C 5.5 12/16/2020      Assessment & Plan:   Problem List Items Addressed This Visit      Cardiovascular and Mediastinum   Essential hypertension   Relevant Orders   CBC (Completed)   Comprehensive metabolic panel (Completed)     Endocrine   Adult hypothyroidism   Relevant Orders   TSH (Completed)   Type 2 diabetes mellitus with hyperglycemia, without long-term current use of insulin (HCC) - Primary   Relevant Orders   Comprehensive metabolic panel (Completed)   Hemoglobin A1c (Completed)   Microalbumin / creatinine urine ratio (Completed)   Urinalysis, Routine w reflex microscopic (Completed)  Other   Elevated cholesterol   Relevant Orders   Comprehensive metabolic panel (Completed)   LDL cholesterol, direct (Completed)   Hematuria   Relevant Orders   Ambulatory referral to Urology      No orders of the defined types were placed in this encounter.   Follow-up: Return in about 6 months (around 06/17/2021), or Keep up the good work!.   Encouraged patient to hydrate well.  May be able to adjust some of his diabetes medicines pending results of today's blood work. Libby Maw, MD

## 2020-12-28 DIAGNOSIS — R319 Hematuria, unspecified: Secondary | ICD-10-CM | POA: Insufficient documentation

## 2020-12-28 NOTE — Addendum Note (Signed)
Addended by: Jon Billings on: 12/28/2020 05:02 PM   Modules accepted: Orders

## 2021-01-04 ENCOUNTER — Telehealth: Payer: Self-pay | Admitting: Orthopaedic Surgery

## 2021-01-04 NOTE — Telephone Encounter (Addendum)
Pt called stating she needs to have a deep cleaning done at her dentist and needs a medical release before she can have this done; pt is trying to get her dentist appt scheduled for next week so she would like to have this release faxed as soon as possible.   Dentist Fax# [phone]  Pt would like a CB when this is done please  [phone]

## 2021-01-05 ENCOUNTER — Encounter: Payer: Self-pay | Admitting: Orthopaedic Surgery

## 2021-01-05 ENCOUNTER — Other Ambulatory Visit: Payer: Self-pay

## 2021-01-05 ENCOUNTER — Ambulatory Visit (INDEPENDENT_AMBULATORY_CARE_PROVIDER_SITE_OTHER): Payer: PPO | Admitting: Orthopaedic Surgery

## 2021-01-05 VITALS — Ht 65.0 in | Wt 226.0 lb

## 2021-01-05 DIAGNOSIS — Z96642 Presence of left artificial hip joint: Secondary | ICD-10-CM

## 2021-01-05 MED ORDER — AMOXICILLIN 500 MG PO CAPS: 2000.0000 mg | ORAL_CAPSULE | Freq: Once | ORAL | 6 refills | Status: AC

## 2021-01-05 NOTE — Progress Notes (Signed)
Post-Op Visit Note   Patient: [name]           Date of Birth: September 22, 1970           MRN: [id number] Visit Date: 01/05/2021 PCP: Libby Maw, MD   Assessment & Plan:  Chief Complaint:  Chief Complaint  Patient presents with  . Left Hip - Follow-up    Left total hip arthroplasty 10/12/2020   Visit Diagnoses:  1. Status post left hip replacement     Plan:   [name] is 3 months status post left total hip replacement.  She has no real complaints other than some itchiness around the surgical scar in numbness in the thigh.  She is very pleased with everything so far.  Left hip shows a fully healed surgical scar.  No significant swelling.  No evidence of infection.  Normal gait and ambulation.  [name] has done very well from her left hip replacement.  She is 3 months now and she can follow-up in another 3 months with standing AP pelvis x-rays.  Activity as tolerated.  Questions encouraged and answered.    Follow-Up Instructions: Return in about 3 months (around 04/07/2021).   Orders:  No orders of the defined types were placed in this encounter.  Meds ordered this encounter  Medications  . amoxicillin (AMOXIL) 500 MG capsule    Sig: Take 4 capsules (2,000 mg total) by mouth once for 1 dose.    Dispense:  4 capsule    Refill:  6    Imaging: No results found.  PMFS History: Patient Active Problem List   Diagnosis Date Noted  . Hematuria 12/28/2020  . Status post total replacement of left hip 10/12/2020  . Type 2 diabetes mellitus with hyperglycemia, without long-term current use of insulin ([place]) 04/02/2020  . Peripheral edema 04/02/2020  . COVID-19 07/09/2019  . Left hip pain 04/30/2019  . Otitis externa of left ear 04/30/2019  . Cholesteatoma of left ear 04/30/2019  . Dysfunction of left eustachian tube 04/30/2019  . Left ear pain 04/30/2019  . Sciatica 04/19/2019  . Class 3 severe obesity due to excess calories with serious comorbidity and body mass index  (BMI) of 45.0 to 49.9 in adult ([place]) 04/19/2019  . Post-nasal drip 01/10/2019  . Allergic cough 01/10/2019  . Obstructive sleep apnea syndrome 12/11/2018  . Acute frontal sinusitis 12/10/2018  . Primary osteoarthritis of left hip 11/13/2018  . Morbid obesity ([name]) 11/13/2018  . Elevated cholesterol 11/12/2018  . Essential hypertension 11/12/2018  . Allergic rhinitis due to pollen 11/12/2018  . Reactive airway disease 11/12/2018  . Breast pain, right 07/17/2018  . Prediabetes 07/17/2018  . Refused influenza vaccine 07/17/2018  . Chronic nonintractable headache 02/12/2018  . Dysuria 02/12/2018  . Localized edema 02/12/2018  . Hospital discharge follow-up 01/23/2018  . Bladder problem 01/05/2018  . Depression 01/05/2018  . Insomnia 01/05/2018  . Hepatitis C infection 12/01/2015  . RLS (restless legs syndrome) 10/23/2015  . Elevated liver enzymes 10/22/2015  . Recurrent major depression-severe ([place]) 04/17/2015  . GAD (generalized anxiety disorder) 04/15/2015  . Bipolar depression ([place]) 04/15/2015  . Severe recurrent major depression w/psychotic features, mood-congruent ([place]) 04/14/2015  . Anxiety disorder 04/14/2015  . Major depressive disorder, recurrent severe without psychotic features ([place]) 04/14/2015  . Suicidal ideations   . Chronic LBP 01/02/2015  . Adult hypothyroidism 06/25/2012  . Cobalamin deficiency 06/25/2012  . Fatigue 06/25/2012  . Chronic arthralgias of knees and hips 06/25/2012  . Leukocytosis 06/25/2012  .  Vitamin D deficiency 06/25/2012   Past Medical History:  Diagnosis Date  . ADHD (attention deficit hyperactivity disorder)   . Anemia    with pregnancy  . Anxiety   . Arthritis   . Bipolar disorder ([place])   . Chronic back pain greater than 3 months duration   . Depression   . Diabetes mellitus without complication ([place])   . Diabetic nephropathy ([place])    bilateral feet  . Dysuria    has to have self in and out cath   . Eroded bladder suspension mesh  ([place])   . GERD (gastroesophageal reflux disease)   . Hepatitis    C  . History of COVID-19 11/2019  . History of MRSA infection    after back surgery  . History of sleep apnea    resolved after weight loss  . Hypertension   . Hypothyroidism   . Leg pain, bilateral   . Obesity   . Obsessive-compulsive disorder   . Pneumonia 12/24/2019  . Rosacea   . Sciatic nerve pain   . Social anxiety disorder   . Squamous cell carcinoma of back 2021    Family History  Problem Relation Age of Onset  . Schizophrenia Father   . Schizophrenia Cousin   . Hyperthyroidism Mother   . Hypothyroidism Maternal Grandmother     Past Surgical History:  Procedure Laterality Date  . BACK SURGERY     total of 6 back surgeries  . bladder mesh  2009  . DILITATION & CURRETTAGE/HYSTROSCOPY WITH NOVASURE ABLATION N/A 08/19/2016   Procedure: DILATATION & CURETTAGE WITH NOVASURE ABLATION;  Surgeon: [name], MD;  Location: [facility];  Service: Gynecology;  Laterality: N/A;  . EUS N/A 09/09/2015   Procedure: UPPER ENDOSCOPIC ULTRASOUND (EUS) RADIAL;  Surgeon: [name], MD;  [facility];  Service: Endoscopy;  Laterality: N/A;  . EYE SURGERY    . INCONTINENCE SURGERY    . SPINAL CORD STIMULATOR INSERTION    . SPINAL CORD STIMULATOR REMOVAL    . SPINAL FUSION  2008   times two  . TOTAL HIP ARTHROPLASTY Left 10/12/2020   Procedure: LEFT TOTAL HIP ARTHROPLASTY ANTERIOR APPROACH;  Surgeon: Leandrew Koyanagi, MD;  Location: [facility];  Service: Orthopedics;  Laterality: Left;  . TUBAL LIGATION  2006   Social History   Occupational History  . Not on file  Tobacco Use  . Smoking status: Light Tobacco Smoker    Packs/day: 1.00    Years: 10.00    Pack years: 10.00    Types: Cigarettes    Last attempt to quit: 02/09/2016    Years since quitting: 4.9  . Smokeless tobacco: Never Used  . Tobacco comment: use vapor, social  Vaping Use  . Vaping Use: Never used  Substance and Sexual Activity  . Alcohol  use: No  . Drug use: No  . Sexual activity: Yes    Birth control/protection: Surgical

## 2021-01-06 ENCOUNTER — Other Ambulatory Visit: Payer: Self-pay | Admitting: Physician Assistant

## 2021-01-06 DIAGNOSIS — K219 Gastro-esophageal reflux disease without esophagitis: Secondary | ICD-10-CM | POA: Diagnosis not present

## 2021-01-06 DIAGNOSIS — K7400 Hepatic fibrosis, unspecified: Secondary | ICD-10-CM

## 2021-01-06 DIAGNOSIS — K5909 Other constipation: Secondary | ICD-10-CM | POA: Diagnosis not present

## 2021-01-06 DIAGNOSIS — Z96649 Presence of unspecified artificial hip joint: Secondary | ICD-10-CM | POA: Diagnosis not present

## 2021-01-06 DIAGNOSIS — R143 Flatulence: Secondary | ICD-10-CM | POA: Diagnosis not present

## 2021-01-06 DIAGNOSIS — Z8619 Personal history of other infectious and parasitic diseases: Secondary | ICD-10-CM | POA: Diagnosis not present

## 2021-01-06 DIAGNOSIS — Z1211 Encounter for screening for malignant neoplasm of colon: Secondary | ICD-10-CM | POA: Diagnosis not present

## 2021-01-06 NOTE — Telephone Encounter (Signed)
FAXED

## 2021-01-12 DIAGNOSIS — M2550 Pain in unspecified joint: Secondary | ICD-10-CM | POA: Diagnosis not present

## 2021-01-12 DIAGNOSIS — G8929 Other chronic pain: Secondary | ICD-10-CM | POA: Diagnosis not present

## 2021-01-12 DIAGNOSIS — F17210 Nicotine dependence, cigarettes, uncomplicated: Secondary | ICD-10-CM | POA: Diagnosis not present

## 2021-01-12 DIAGNOSIS — Z79899 Other long term (current) drug therapy: Secondary | ICD-10-CM | POA: Diagnosis not present

## 2021-01-12 DIAGNOSIS — M545 Low back pain, unspecified: Secondary | ICD-10-CM | POA: Diagnosis not present

## 2021-01-12 DIAGNOSIS — M25552 Pain in left hip: Secondary | ICD-10-CM | POA: Diagnosis not present

## 2021-01-25 DIAGNOSIS — F909 Attention-deficit hyperactivity disorder, unspecified type: Secondary | ICD-10-CM | POA: Diagnosis not present

## 2021-01-25 DIAGNOSIS — F429 Obsessive-compulsive disorder, unspecified: Secondary | ICD-10-CM | POA: Diagnosis not present

## 2021-01-25 DIAGNOSIS — F3132 Bipolar disorder, current episode depressed, moderate: Secondary | ICD-10-CM | POA: Diagnosis not present

## 2021-01-25 DIAGNOSIS — F411 Generalized anxiety disorder: Secondary | ICD-10-CM | POA: Diagnosis not present

## 2021-01-26 DIAGNOSIS — Z6841 Body Mass Index (BMI) 40.0 and over, adult: Secondary | ICD-10-CM | POA: Diagnosis not present

## 2021-01-30 ENCOUNTER — Emergency Department (HOSPITAL_BASED_OUTPATIENT_CLINIC_OR_DEPARTMENT_OTHER)
Admission: EM | Admit: 2021-01-30 | Discharge: 2021-01-31 | Disposition: A | Discharge status: Home / Self Care | Payer: PPO | Attending: Emergency Medicine | Admitting: Emergency Medicine

## 2021-01-30 ENCOUNTER — Encounter (HOSPITAL_BASED_OUTPATIENT_CLINIC_OR_DEPARTMENT_OTHER): Payer: Self-pay | Admitting: *Deleted

## 2021-01-30 ENCOUNTER — Other Ambulatory Visit: Payer: Self-pay

## 2021-01-30 ENCOUNTER — Emergency Department (HOSPITAL_BASED_OUTPATIENT_CLINIC_OR_DEPARTMENT_OTHER): Payer: PPO

## 2021-01-30 DIAGNOSIS — R0602 Shortness of breath: Secondary | ICD-10-CM | POA: Diagnosis not present

## 2021-01-30 DIAGNOSIS — R079 Chest pain, unspecified: Secondary | ICD-10-CM | POA: Diagnosis not present

## 2021-01-30 DIAGNOSIS — Z8616 Personal history of COVID-19: Secondary | ICD-10-CM | POA: Insufficient documentation

## 2021-01-30 DIAGNOSIS — R059 Cough, unspecified: Secondary | ICD-10-CM | POA: Diagnosis not present

## 2021-01-30 DIAGNOSIS — Z7984 Long term (current) use of oral hypoglycemic drugs: Secondary | ICD-10-CM | POA: Insufficient documentation

## 2021-01-30 DIAGNOSIS — E039 Hypothyroidism, unspecified: Secondary | ICD-10-CM | POA: Diagnosis not present

## 2021-01-30 DIAGNOSIS — J189 Pneumonia, unspecified organism: Secondary | ICD-10-CM | POA: Insufficient documentation

## 2021-01-30 DIAGNOSIS — I10 Essential (primary) hypertension: Secondary | ICD-10-CM | POA: Diagnosis not present

## 2021-01-30 DIAGNOSIS — Z85828 Personal history of other malignant neoplasm of skin: Secondary | ICD-10-CM | POA: Insufficient documentation

## 2021-01-30 DIAGNOSIS — Z96642 Presence of left artificial hip joint: Secondary | ICD-10-CM | POA: Insufficient documentation

## 2021-01-30 DIAGNOSIS — Z79899 Other long term (current) drug therapy: Secondary | ICD-10-CM | POA: Insufficient documentation

## 2021-01-30 DIAGNOSIS — E1121 Type 2 diabetes mellitus with diabetic nephropathy: Secondary | ICD-10-CM | POA: Insufficient documentation

## 2021-01-30 DIAGNOSIS — F17210 Nicotine dependence, cigarettes, uncomplicated: Secondary | ICD-10-CM | POA: Insufficient documentation

## 2021-01-30 DIAGNOSIS — Z7982 Long term (current) use of aspirin: Secondary | ICD-10-CM | POA: Diagnosis not present

## 2021-01-30 NOTE — ED Triage Notes (Signed)
Cough and sneezing since yesterday. Reports coughing and feeling SOB. Evaluated during triage by RT. States she has been using her inhaler with some relief

## 2021-01-31 MED ORDER — HYDROCOD POLST-CPM POLST ER 10-8 MG/5ML PO SUER
5.0000 mL | Freq: Once | ORAL | Status: AC
  Administered 2021-01-31: 5 mL via ORAL
  Filled 2021-01-31: qty 5

## 2021-01-31 MED ORDER — DOXYCYCLINE HYCLATE 100 MG PO TABS
100.0000 mg | ORAL_TABLET | Freq: Once | ORAL | Status: AC
  Administered 2021-01-31: 100 mg via ORAL
  Filled 2021-01-31: qty 1

## 2021-01-31 MED ORDER — ALBUTEROL SULFATE HFA 108 (90 BASE) MCG/ACT IN AERS
2.0000 | INHALATION_SPRAY | RESPIRATORY_TRACT | Status: DC | PRN
  Administered 2021-01-31: 2 via RESPIRATORY_TRACT
  Filled 2021-01-31: qty 6.7

## 2021-01-31 MED ORDER — HYDROCOD POLST-CPM POLST ER 10-8 MG/5ML PO SUER: 5.0000 mL | Freq: Two times a day (BID) | ORAL | 0 refills | Status: DC | PRN

## 2021-01-31 MED ORDER — DOXYCYCLINE HYCLATE 100 MG PO CAPS: 100.0000 mg | ORAL_CAPSULE | Freq: Two times a day (BID) | ORAL | 0 refills | Status: DC

## 2021-01-31 NOTE — ED Provider Notes (Signed)
[facility] Provider Note: Georgena Spurling, MD, FACEP  CSN: [id number] MRN: [id number] ARRIVAL: 01/30/21 at [place]: [id number]/[id number]   CHIEF COMPLAINT  Cough   HISTORY OF PRESENT ILLNESS  01/31/21 12:27 AM [name] is a 50 y.o. female with a 2-day history of cough and shortness of breath with wheezing.  Symptoms are moderate and she is gotten partial relief using her albuterol inhaler which is about 47 years old.  She has not had fever but has had sneezing with this.  She denies nausea, vomiting or diarrhea.  She did have COVID 2 years ago.   Past Medical History:  Diagnosis Date  . ADHD (attention deficit hyperactivity disorder)   . Anemia    with pregnancy  . Anxiety   . Arthritis   . Bipolar disorder ([place])   . Chronic back pain greater than 3 months duration   . Depression   . Diabetes mellitus without complication ([place])   . Diabetic nephropathy ([place])    bilateral feet  . Dysuria    has to have self in and out cath   . Eroded bladder suspension mesh ([place])   . GERD (gastroesophageal reflux disease)   . Hepatitis    C  . History of COVID-19 11/2019  . History of MRSA infection    after back surgery  . History of sleep apnea    resolved after weight loss  . Hypertension   . Hypothyroidism   . Leg pain, bilateral   . Obesity   . Obsessive-compulsive disorder   . Pneumonia 12/24/2019  . Rosacea   . Sciatic nerve pain   . Social anxiety disorder   . Squamous cell carcinoma of back 2021    Past Surgical History:  Procedure Laterality Date  . BACK SURGERY     total of 6 back surgeries  . bladder mesh  2009  . DILITATION & CURRETTAGE/HYSTROSCOPY WITH NOVASURE ABLATION N/A 08/19/2016   Procedure: DILATATION & CURETTAGE WITH NOVASURE ABLATION;  Surgeon: [name], MD;  Location: [facility];  Service: Gynecology;  Laterality: N/A;  . EUS N/A 09/09/2015   Procedure: UPPER ENDOSCOPIC ULTRASOUND (EUS) RADIAL;  Surgeon: [name], MD;  Location: [facility];  Service: Endoscopy;  Laterality: N/A;  . EYE SURGERY    . INCONTINENCE SURGERY    . SPINAL CORD STIMULATOR INSERTION    . SPINAL CORD STIMULATOR REMOVAL    . SPINAL FUSION  2008   times two  . TOTAL HIP ARTHROPLASTY Left 10/12/2020   Procedure: LEFT TOTAL HIP ARTHROPLASTY ANTERIOR APPROACH;  Surgeon: Leandrew Koyanagi, MD;  Location: [facility];  Service: Orthopedics;  Laterality: Left;  . TUBAL LIGATION  2006    Family History  Problem Relation Age of Onset  . Schizophrenia Father   . Schizophrenia Cousin   . Hyperthyroidism Mother   . Hypothyroidism Maternal Grandmother     Social History   Tobacco Use  . Smoking status: Light Tobacco Smoker    Packs/day: 1.00    Years: 10.00    Pack years: 10.00    Types: Cigarettes    Last attempt to quit: 02/09/2016    Years since quitting: 4.9  . Smokeless tobacco: Never Used  . Tobacco comment: use vapor, social  Vaping Use  . Vaping Use: Never used  Substance Use Topics  . Alcohol use: No  . Drug use: No    Prior to Admission medications   Medication Sig Start Date End Date Taking? Authorizing  Provider  doxycycline (VIBRAMYCIN) 100 MG capsule Take 1 capsule (100 mg total) by mouth 2 (two) times daily. One po bid x 7 days 01/31/21  Yes Xavious Sharrar, MD  ALPRAZolam Duanne Moron) 0.5 MG tablet Take 0.5 mg by mouth 2 (two) times daily as needed for anxiety. 12/20/19   [provider]  ARIPiprazole (ABILIFY) 5 MG tablet Take 1 tablet (5 mg total) by mouth daily. 09/17/15   Clarene Reamer, MD  aspirin EC 81 MG tablet Take 1 tablet (81 mg total) by mouth 2 (two) times daily. To be taken after surgery 10/08/20   Aundra Dubin, PA-C  atorvastatin (LIPITOR) 20 MG tablet TAKE 1 TABLET(20 MG) BY MOUTH DAILY Patient taking differently: Take 20 mg by mouth daily. TAKE 1 TABLET(20 MG) BY MOUTH DAILY 07/07/20   Libby Maw, MD  blood glucose meter kit and supplies KIT Dispense based on patient and insurance preference. Check  glucose before breakfast. ICD: E11.65 04/02/20   Nche, Charlene Brooke, NP  clomiPRAMINE (ANAFRANIL) 50 MG capsule Take 100 mg by mouth at bedtime.    [provider]  docusate sodium (COLACE) 100 MG capsule Take 1 capsule (100 mg total) by mouth daily as needed. 10/08/20 10/08/21  Aundra Dubin, PA-C  FINACEA 15 % FOAM Apply 1 application topically 2 (two) times daily as needed (rosacea). 02/14/20   [provider]  gabapentin (NEURONTIN) 300 MG capsule TAKE 1 CAPSULE(300 MG) BY MOUTH THREE TIMES DAILY 12/07/20   [place] Quint B, FNP  gabapentin (NEURONTIN) 300 MG capsule TAKE 1 CAPSULE(300 MG) BY MOUTH THREE TIMES DAILY 12/07/20   [place] Quint B, FNP  glucose blood test strip Use as instructed, check glucose before breakfast. ICD: E11.65 07/21/20   Libby Maw, MD  lamoTRIgine (LAMICTAL) 150 MG tablet Take 150 mg by mouth 2 (two) times daily. 02/22/20   [provider]  Lancets (FREESTYLE) lancets Use as instructed. E11.65 07/21/20   Libby Maw, MD  levothyroxine (SYNTHROID) 75 MCG tablet Take 1 tablet (75 mcg total) by mouth daily before breakfast. **Needs office visit** 10/30/20   Libby Maw, MD  lisinopril (ZESTRIL) 10 MG tablet TAKE 1 TABLET(10 MG) BY MOUTH DAILY Patient taking differently: Take 10 mg by mouth daily. TAKE 1 TABLET(10 MG) BY MOUTH DAILY 04/08/20   Libby Maw, MD  metFORMIN (GLUCOPHAGE) 500 MG tablet Take 1 tablet (500 mg total) by mouth 2 (two) times daily with a meal. 04/08/20   Libby Maw, MD  methocarbamol (ROBAXIN) 500 MG tablet Take 1 tablet (500 mg total) by mouth 2 (two) times daily as needed. To be taken after surgery 10/27/20   Leandrew Koyanagi, MD  methylphenidate (RITALIN) 20 MG tablet Take 20 mg by mouth 2 (two) times daily. 09/22/20   [provider]  omeprazole (PRILOSEC) 20 MG capsule Take 20 mg by mouth as needed.    [provider]  ondansetron (ZOFRAN) 4 MG tablet Take 1 tablet  (4 mg total) by mouth every 8 (eight) hours as needed for nausea or vomiting. 10/08/20   Aundra Dubin, PA-C  OVER THE COUNTER MEDICATION Take 2 tablets by mouth every 6 (six) hours as needed (headache). Headache Relief    [provider]  oxyCODONE-acetaminophen (PERCOCET) 10-325 MG tablet Take 1 tablet by mouth every 6 (six) hours as needed for pain. 10/13/20 10/13/21  Aundra Dubin, PA-C  phentermine 15 MG capsule Take by mouth. 05/06/20 06/05/20  [provider]  Semaglutide,0.25 or 0.5MG/DOS, 2 MG/1.5ML SOPN Inject 0.25 mg into the skin every Thursday. 07/14/20   [provider]    Allergies Pregabalin, Celebrex [celecoxib], Effexor [venlafaxine hcl], Mirapex [pramipexole], Pristiq [desvenlafaxine], Prozac [fluoxetine hcl], Symbyax [olanzapine-fluoxetine hcl], and Wellbutrin [bupropion hcl]   REVIEW OF SYSTEMS  Negative except as noted here or in the History of Present Illness.   PHYSICAL EXAMINATION  Initial Vital Signs Blood pressure (!) 114/59, pulse 85, temperature 98.7 F (37.1 C), temperature source Oral, resp. rate (!) 21, height 5' 5"  (1.651 m), weight 104.3 kg, last menstrual period 12/31/2020, SpO2 96 %.  Examination General: Well-developed, well-nourished female in no acute distress; appearance consistent with age of record HENT: normocephalic; atraumatic Eyes: pupils equal, round and reactive to light; extraocular muscles intact Neck: supple Heart: regular rate and rhythm; no murmurs, rubs or gallops Lungs: clear to auscultation bilaterally Abdomen: soft; nondistended; nontender; no masses or hepatosplenomegaly; bowel sounds present Extremities: No deformity; full range of motion; pulses normal Neurologic: Awake, alert and oriented; motor function intact in all extremities and symmetric; no facial droop Skin: Warm and dry Psychiatric: Normal mood and affect   RESULTS  Summary of this visit's results, reviewed and interpreted by myself:    EKG Interpretation  Date/Time:    Ventricular Rate:    PR Interval:    QRS Duration:   QT Interval:    QTC Calculation:   R Axis:     Text Interpretation:        Laboratory Studies: No results found for this or any previous visit (from the past 24 hour(s)). Imaging Studies: DG Chest 2 View  Result Date: 01/31/2021 CLINICAL DATA:  Chest pain EXAM: CHEST - 2 VIEW COMPARISON:  01/24/2020 FINDINGS: Patchy perihilar opacities. No pleural effusion. Normal heart size. No pneumothorax IMPRESSION: Patchy perihilar opacities, possible pneumonia, to include atypical or viral process Electronically Signed   By: Donavan Foil M.D.   On: 01/31/2021 00:10    ED COURSE and MDM  Nursing notes, initial and subsequent vitals signs, including pulse oximetry, reviewed and interpreted by myself.  Vitals:   01/30/21 2301 01/30/21 2315 01/31/21 0000 01/31/21 0045  BP:  (!) 105/53 (!) 114/59   Pulse:  89 85   Resp:  (!) 22 (!) 21   Temp:      TempSrc:      SpO2:  97% 96% 96%  Weight: 104.3 kg     Height: 5' 5"  (1.651 m)      Medications  albuterol (VENTOLIN HFA) 108 (90 Base) MCG/ACT inhaler 2 puff (2 puffs Inhalation Given 01/31/21 0045)  doxycycline (VIBRA-TABS) tablet 100 mg (has no administration in time range)   1:18 AM Wheezing and air movement improved after albuterol treatment.  Patient given albuterol inhaler and AeroChamber and instructed in their use.  We will treat patient with doxycycline which will cover atypical organizations.  Patient declined a COVID test.  PROCEDURES  Procedures   ED DIAGNOSES     ICD-10-CM   1. Atypical pneumonia  J18.9   2. Cough  R05.9        Jazon Jipson, Jenny Reichmann, MD 01/31/21 0120

## 2021-02-02 ENCOUNTER — Other Ambulatory Visit: Payer: Self-pay | Admitting: Family Medicine

## 2021-02-02 DIAGNOSIS — I10 Essential (primary) hypertension: Secondary | ICD-10-CM

## 2021-02-03 ENCOUNTER — Other Ambulatory Visit: Payer: Self-pay | Admitting: Family Medicine

## 2021-02-03 ENCOUNTER — Other Ambulatory Visit: Payer: PPO

## 2021-02-03 DIAGNOSIS — E039 Hypothyroidism, unspecified: Secondary | ICD-10-CM

## 2021-02-04 ENCOUNTER — Telehealth (INDEPENDENT_AMBULATORY_CARE_PROVIDER_SITE_OTHER): Payer: PPO | Admitting: Family Medicine

## 2021-02-04 ENCOUNTER — Encounter: Payer: Self-pay | Admitting: Family Medicine

## 2021-02-04 VITALS — Temp 100.6°F | Ht 65.0 in | Wt 230.0 lb

## 2021-02-04 DIAGNOSIS — J22 Unspecified acute lower respiratory infection: Secondary | ICD-10-CM | POA: Diagnosis not present

## 2021-02-04 MED ORDER — CLINDAMYCIN HCL 300 MG PO CAPS: 300.0000 mg | ORAL_CAPSULE | Freq: Three times a day (TID) | ORAL | 0 refills | Status: AC

## 2021-02-04 MED ORDER — PREDNISONE 20 MG PO TABS: 20.0000 mg | ORAL_TABLET | Freq: Two times a day (BID) | ORAL | 0 refills | Status: AC

## 2021-02-04 NOTE — Progress Notes (Signed)
Established Patient Office Visit  Subjective:  Patient ID: [name], female    DOB: 02-28-1971  Age: 50 y.o. MRN: [id number]  CC:  Chief Complaint  Patient presents with  . Hospitalization Follow-up    Hospital follow up diagnosed with pneumonia patient states that antibiotics does not seem to be working she would like something stronger.     HPI [name] presents for 5-6 day ho uri symptoms with rhinorrhea, pnd, sorethroat, laryngitis, cough with wheezing. Had a temp 100.5. No Covid test. No ho asthma but has rad with illness. Denies alternation of taste or smell. No diarrhea. Had covid early in pandemic. Sp Covid vaccine and booster. Has clear phlegm on occasion. Xray from ER visit on 5/28 showed patchy infiltrate. O2 sat 96%.  She has not had a COVID test.  She was prescribed Doxy cycling 100 twice daily and an inhaler.  Continues to smoke.  She is taking Doxy often for acne.  She is concerned that it is not helping. Clindamycin has helped more in the past.   Past Medical History:  Diagnosis Date  . ADHD (attention deficit hyperactivity disorder)   . Anemia    with pregnancy  . Anxiety   . Arthritis   . Bipolar disorder ([place])   . Chronic back pain greater than 3 months duration   . Depression   . Diabetes mellitus without complication ([place])   . Diabetic nephropathy ([place])    bilateral feet  . Dysuria    has to have self in and out cath   . Eroded bladder suspension mesh ([place])   . GERD (gastroesophageal reflux disease)   . Hepatitis    C  . History of COVID-19 11/2019  . History of MRSA infection    after back surgery  . History of sleep apnea    resolved after weight loss  . Hypertension   . Hypothyroidism   . Leg pain, bilateral   . Obesity   . Obsessive-compulsive disorder   . Pneumonia 12/24/2019  . Rosacea   . Sciatic nerve pain   . Social anxiety disorder   . Squamous cell carcinoma of back 2021    Past Surgical History:  Procedure Laterality  Date  . BACK SURGERY     total of 6 back surgeries  . bladder mesh  2009  . DILITATION & CURRETTAGE/HYSTROSCOPY WITH NOVASURE ABLATION N/A 08/19/2016   Procedure: DILATATION & CURETTAGE WITH NOVASURE ABLATION;  Surgeon: [name], MD;  Location: [facility];  Service: Gynecology;  Laterality: N/A;  . EUS N/A 09/09/2015   Procedure: UPPER ENDOSCOPIC ULTRASOUND (EUS) RADIAL;  Surgeon: [name], MD;  Location: [facility];  Service: Endoscopy;  Laterality: N/A;  . EYE SURGERY    . INCONTINENCE SURGERY    . SPINAL CORD STIMULATOR INSERTION    . SPINAL CORD STIMULATOR REMOVAL    . SPINAL FUSION  2008   times two  . TOTAL HIP ARTHROPLASTY Left 10/12/2020   Procedure: LEFT TOTAL HIP ARTHROPLASTY ANTERIOR APPROACH;  Surgeon: Leandrew Koyanagi, MD;  Location: [facility];  Service: Orthopedics;  Laterality: Left;  . TUBAL LIGATION  2006    Family History  Problem Relation Age of Onset  . Schizophrenia Father   . Schizophrenia Cousin   . Hyperthyroidism Mother   . Hypothyroidism Maternal Grandmother     Social History   Socioeconomic History  . Marital status: Legally Separated    Spouse name: Not on file  . Number of  children: Not on file  . Years of education: Not on file  . Highest education level: Not on file  Occupational History  . Not on file  Tobacco Use  . Smoking status: Light Tobacco Smoker    Packs/day: 1.00    Years: 10.00    Pack years: 10.00    Types: Cigarettes    Last attempt to quit: 02/09/2016    Years since quitting: 4.9  . Smokeless tobacco: Never Used  . Tobacco comment: use vapor, social  Vaping Use  . Vaping Use: Never used  Substance and Sexual Activity  . Alcohol use: No  . Drug use: No  . Sexual activity: Yes    Birth control/protection: Surgical  Other Topics Concern  . Not on file  Social History Narrative  . Not on file   Social Determinants of Health   Financial Resource Strain: Medium Risk  . Difficulty of Paying Living Expenses: Somewhat  hard  Food Insecurity: Food Insecurity Present  . Worried About Charity fundraiser in the Last Year: Sometimes true  . Ran Out of Food in the Last Year: Never true  Transportation Needs: Unmet Transportation Needs  . Lack of Transportation (Medical): Yes  . Lack of Transportation (Non-Medical): No  Physical Activity: Insufficiently Active  . Days of Exercise per Week: 3 days  . Minutes of Exercise per Session: 20 min  Stress: No Stress Concern Present  . Feeling of Stress : Only a little  Social Connections: Socially Isolated  . Frequency of Communication with Friends and Family: More than three times a week  . Frequency of Social Gatherings with Friends and Family: More than three times a week  . Attends Religious Services: Never  . Active Member of Clubs or Organizations: No  . Attends Archivist Meetings: Never  . Marital Status: Separated  Intimate Partner Violence: Not At Risk  . Fear of Current or Ex-Partner: No  . Emotionally Abused: No  . Physically Abused: No  . Sexually Abused: No    Outpatient Medications Prior to Visit  Medication Sig Dispense Refill  . ALPRAZolam (XANAX) 0.5 MG tablet Take 0.5 mg by mouth 2 (two) times daily as needed for anxiety.    . ARIPiprazole (ABILIFY) 5 MG tablet Take 1 tablet (5 mg total) by mouth daily. 30 tablet 2  . atorvastatin (LIPITOR) 20 MG tablet TAKE 1 TABLET(20 MG) BY MOUTH DAILY (Patient taking differently: Take 20 mg by mouth daily. TAKE 1 TABLET(20 MG) BY MOUTH DAILY) 100 tablet 3  . blood glucose meter kit and supplies KIT Dispense based on patient and insurance preference. Check glucose before breakfast. ICD: E11.65 1 each 0  . chlorpheniramine-HYDROcodone (TUSSIONEX PENNKINETIC ER) 10-8 MG/5ML SUER Take 5 mLs by mouth every 12 (twelve) hours as needed. 70 mL 0  . clomiPRAMINE (ANAFRANIL) 50 MG capsule Take 100 mg by mouth at bedtime.    Marland Kitchen doxycycline (VIBRAMYCIN) 100 MG capsule Take 1 capsule (100 mg total) by mouth 2  (two) times daily. One po bid x 7 days 14 capsule 0  . FINACEA 15 % FOAM Apply 1 application topically 2 (two) times daily as needed (rosacea).    . gabapentin (NEURONTIN) 300 MG capsule TAKE 1 CAPSULE(300 MG) BY MOUTH THREE TIMES DAILY 90 capsule 2  . glucose blood test strip Use as instructed, check glucose before breakfast. ICD: E11.65 100 each 12  . lamoTRIgine (LAMICTAL) 150 MG tablet Take 150 mg by mouth 2 (two) times daily.    Marland Kitchen  Lancets (FREESTYLE) lancets Use as instructed. E11.65 100 each 12  . levothyroxine (SYNTHROID) 75 MCG tablet TAKE 1 TABLET(75 MCG) BY MOUTH DAILY BEFORE AND BREAKFAST 90 tablet 0  . lisinopril (ZESTRIL) 10 MG tablet TAKE 1 TABLET(10 MG) BY MOUTH DAILY 90 tablet 3  . metFORMIN (GLUCOPHAGE) 500 MG tablet Take 1 tablet (500 mg total) by mouth 2 (two) times daily with a meal. 60 tablet 5  . methylphenidate (RITALIN) 20 MG tablet Take 20 mg by mouth 2 (two) times daily.    Marland Kitchen OVER THE COUNTER MEDICATION Take 2 tablets by mouth every 6 (six) hours as needed (headache). Headache Relief    . oxyCODONE-acetaminophen (PERCOCET) 10-325 MG tablet Take 1 tablet by mouth every 6 (six) hours as needed for pain. 40 tablet 0  . pantoprazole (PROTONIX) 40 MG tablet Take 1 tablet by mouth daily.    . Semaglutide,0.25 or 0.5MG/DOS, 2 MG/1.5ML SOPN Inject 0.25 mg into the skin every Thursday.    Marland Kitchen aspirin EC 81 MG tablet Take 1 tablet (81 mg total) by mouth 2 (two) times daily. To be taken after surgery (Patient not taking: Reported on 02/04/2021) 84 tablet 0  . docusate sodium (COLACE) 100 MG capsule Take 1 capsule (100 mg total) by mouth daily as needed. (Patient not taking: Reported on 02/04/2021) 30 capsule 2  . gabapentin (NEURONTIN) 300 MG capsule TAKE 1 CAPSULE(300 MG) BY MOUTH THREE TIMES DAILY 90 capsule 2  . methocarbamol (ROBAXIN) 500 MG tablet Take 1 tablet (500 mg total) by mouth 2 (two) times daily as needed. To be taken after surgery (Patient not taking: Reported on 02/04/2021) 30  tablet 3  . omeprazole (PRILOSEC) 20 MG capsule Take 20 mg by mouth as needed. (Patient not taking: Reported on 02/04/2021)    . ondansetron (ZOFRAN) 4 MG tablet Take 1 tablet (4 mg total) by mouth every 8 (eight) hours as needed for nausea or vomiting. 40 tablet 0  . phentermine 15 MG capsule Take by mouth.     No facility-administered medications prior to visit.    Allergies  Allergen Reactions  . Pregabalin Anaphylaxis  . Celebrex [Celecoxib] Nausea And Vomiting    Stomach pain  . Effexor [Venlafaxine Hcl] Other (See Comments)    Serotonin toxicity  . Mirapex [Pramipexole]     Mirapex- Worsening "body jerks"  . Pristiq [Desvenlafaxine] Other (See Comments)    Serotonin toxicity   . Prozac [Fluoxetine Hcl] Swelling  . Symbyax [Olanzapine-Fluoxetine Hcl] Swelling  . Wellbutrin [Bupropion Hcl] Swelling    ROS Review of Systems  Constitutional: Positive for fever. Negative for chills, diaphoresis, fatigue and unexpected weight change.  HENT: Positive for congestion, postnasal drip and sore throat.   Eyes: Negative for photophobia and visual disturbance.  Respiratory: Positive for cough and wheezing. Negative for shortness of breath.   Cardiovascular: Negative.   Gastrointestinal: Negative for abdominal pain and diarrhea.  Genitourinary: Negative.   Musculoskeletal: Negative for arthralgias and joint swelling.  Skin: Negative.  Negative for rash.  Neurological: Negative.       Objective:    Physical Exam Vitals and nursing note reviewed.  Constitutional:      Appearance: Normal appearance.  HENT:     Head: Normocephalic and atraumatic.  Eyes:     General: No scleral icterus.       Right eye: No discharge.        Left eye: No discharge.     Conjunctiva/sclera: Conjunctivae normal.  Pulmonary:  Effort: Pulmonary effort is normal. No respiratory distress.  Neurological:     Mental Status: She is alert and oriented to person, place, and time.  Psychiatric:         Mood and Affect: Mood normal.        Behavior: Behavior normal.     Temp (!) 100.6 F (38.1 C) (Temporal)   Ht _0  (1.651 m)   Wt 230 lb (104.3 kg)   BMI 38.27 kg/m  Wt Readings from Last 3 Encounters:  02/04/21 230 lb (104.3 kg)  01/30/21 230 lb (104.3 kg)  01/05/21 226 lb (102.5 kg)     Health Maintenance Due  Topic Date Due  . HIV Screening  Never done  . COLONOSCOPY (Pts 45-5yr Insurance coverage will need to be confirmed)  Never done    There are no preventive care reminders to display for this patient.  Lab Results  Component Value Date   TSH 1.78 12/16/2020   Lab Results  Component Value Date   WBC 10.6 (H) 12/16/2020   HGB 13.4 12/16/2020   HCT 40.8 12/16/2020   MCV 78.2 12/16/2020   PLT 317.0 12/16/2020   Lab Results  Component Value Date   NA 136 12/16/2020   K 4.5 12/16/2020   CO2 30 12/16/2020   GLUCOSE 89 12/16/2020   BUN 17 12/16/2020   CREATININE 0.69 12/16/2020   BILITOT 0.3 12/16/2020   ALKPHOS 87 12/16/2020   AST 11 12/16/2020   ALT 17 12/16/2020   PROT 6.6 12/16/2020   ALBUMIN 3.9 12/16/2020   CALCIUM 9.8 12/16/2020   ANIONGAP 8 10/13/2020   GFR 102.93 12/16/2020   Lab Results  Component Value Date   CHOL 166 11/12/2018   Lab Results  Component Value Date   HDL 35.80 (L) 11/12/2018   Lab Results  Component Value Date   LDLCALC 174 (H) 04/15/2015   Lab Results  Component Value Date   TRIG 221.0 (H) 11/12/2018   Lab Results  Component Value Date   CHOLHDL 5 11/12/2018   Lab Results  Component Value Date   HGBA1C 5.5 12/16/2020      Assessment & Plan:   Problem List Items Addressed This Visit      Respiratory   Lower respiratory infection - Primary   Relevant Medications   clindamycin (CLEOCIN) 300 MG capsule   predniSONE (DELTASONE) 20 MG tablet      Meds ordered this encounter  Medications  . clindamycin (CLEOCIN) 300 MG capsule    Sig: Take 1 capsule (300 mg total) by mouth 3 (three) times daily  for 10 days.    Dispense:  30 capsule    Refill:  0  . predniSONE (DELTASONE) 20 MG tablet    Sig: Take 1 tablet (20 mg total) by mouth 2 (two) times daily with a meal for 7 days.    Dispense:  14 tablet    Refill:  0    Follow-up: Return if symptoms worsen or fail to improve.  Will change to clindamycin and hold doxycycline.  She will start prednisone 20 twice daily for 7 days.  Advised her to use her inhaler for cough and/or wheeze.  She will have a COVID test performed by her pharmacy.  Follow-up in 1 week if not improving or sooner if worse.  Virtual Visit via Video Note  I connected with [name] on 02/04/21 at  2:00 PM EDT by a video enabled telemedicine application and verified that I am speaking  with the correct person using two identifiers.  Location: Patient: home with daughter.  Provider: work   I discussed the limitations of evaluation and management by telemedicine and the availability of in person appointments. The patient expressed understanding and agreed to proceed.  History of Present Illness:    Observations/Objective:   Assessment and Plan:   Follow Up Instructions:    I discussed the assessment and treatment plan with the patient. The patient was provided an opportunity to ask questions and all were answered. The patient agreed with the plan and demonstrated an understanding of the instructions.   The patient was advised to call back or seek an in-person evaluation if the symptoms worsen or if the condition fails to improve as anticipated.  I provided 25 minutes of non-face-to-face time during this encounter.   Libby Maw, MD   Libby Maw, MD

## 2021-02-15 ENCOUNTER — Telehealth: Payer: Self-pay | Admitting: Family Medicine

## 2021-02-15 ENCOUNTER — Ambulatory Visit: Payer: Self-pay | Admitting: Urology

## 2021-02-15 DIAGNOSIS — R490 Dysphonia: Secondary | ICD-10-CM

## 2021-02-15 NOTE — Telephone Encounter (Signed)
Patient is calling to see if she can be referred to ENT to have her vocal chords checked again (did not remember then ENT that she previously went to). Please call her at [phone] if you have any questions.

## 2021-02-17 ENCOUNTER — Ambulatory Visit: Payer: Self-pay | Admitting: Urology

## 2021-02-17 NOTE — Telephone Encounter (Signed)
Patient aware of message below.

## 2021-02-18 ENCOUNTER — Ambulatory Visit
Admission: RE | Admit: 2021-02-18 | Discharge: 2021-02-18 | Disposition: A | Discharge status: Home / Self Care | Payer: PPO | Source: Ambulatory Visit | Attending: Physician Assistant | Admitting: Physician Assistant

## 2021-02-18 DIAGNOSIS — K7400 Hepatic fibrosis, unspecified: Secondary | ICD-10-CM | POA: Diagnosis not present

## 2021-02-18 DIAGNOSIS — E669 Obesity, unspecified: Secondary | ICD-10-CM | POA: Diagnosis not present

## 2021-02-18 DIAGNOSIS — M25552 Pain in left hip: Secondary | ICD-10-CM | POA: Diagnosis not present

## 2021-02-18 DIAGNOSIS — M545 Low back pain, unspecified: Secondary | ICD-10-CM | POA: Diagnosis not present

## 2021-02-18 DIAGNOSIS — K219 Gastro-esophageal reflux disease without esophagitis: Secondary | ICD-10-CM

## 2021-02-18 DIAGNOSIS — Z79899 Other long term (current) drug therapy: Secondary | ICD-10-CM | POA: Diagnosis not present

## 2021-02-18 DIAGNOSIS — F17210 Nicotine dependence, cigarettes, uncomplicated: Secondary | ICD-10-CM | POA: Diagnosis not present

## 2021-02-19 ENCOUNTER — Other Ambulatory Visit: Payer: Self-pay | Admitting: Physician Assistant

## 2021-02-19 DIAGNOSIS — R932 Abnormal findings on diagnostic imaging of liver and biliary tract: Secondary | ICD-10-CM | POA: Diagnosis not present

## 2021-02-19 DIAGNOSIS — K8050 Calculus of bile duct without cholangitis or cholecystitis without obstruction: Secondary | ICD-10-CM

## 2021-02-22 ENCOUNTER — Other Ambulatory Visit: Payer: Self-pay

## 2021-02-22 ENCOUNTER — Ambulatory Visit (INDEPENDENT_AMBULATORY_CARE_PROVIDER_SITE_OTHER): Payer: PPO | Admitting: Otolaryngology

## 2021-02-22 DIAGNOSIS — R490 Dysphonia: Secondary | ICD-10-CM

## 2021-02-22 NOTE — Progress Notes (Signed)
HPI: [name] is a 50 y.o. female who presents is referred by her PCP for evaluation of hoarseness.  I had previously seen her about a year ago for hoarseness that was secondary to vocal cord nodules that improved with voice rest and steroids.  She has been under more stress recently.  She is smoking about a pack a day presently.  She denies any sore throat but has hoarseness.  She does talk a lot..  Past Medical History:  Diagnosis Date   ADHD (attention deficit hyperactivity disorder)    Anemia    with pregnancy   Anxiety    Arthritis    Bipolar disorder (HCC)    Chronic back pain greater than 3 months duration    Depression    Diabetes mellitus without complication (HCC)    Diabetic nephropathy ([place])    bilateral feet   Dysuria    has to have self in and out cath    Eroded bladder suspension mesh (HCC)    GERD (gastroesophageal reflux disease)    Hepatitis    C   History of COVID-19 11/2019   History of MRSA infection    after back surgery   History of sleep apnea    resolved after weight loss   Hypertension    Hypothyroidism    Leg pain, bilateral    Obesity    Obsessive-compulsive disorder    Pneumonia 12/24/2019   Rosacea    Sciatic nerve pain    Social anxiety disorder    Squamous cell carcinoma of back 2021   Past Surgical History:  Procedure Laterality Date   BACK SURGERY     total of 6 back surgeries   bladder mesh  2009   DILITATION & CURRETTAGE/HYSTROSCOPY WITH NOVASURE ABLATION N/A 08/19/2016   Procedure: DILATATION & CURETTAGE WITH NOVASURE ABLATION;  Surgeon: [name], MD;  Location: [facility];  Service: Gynecology;  Laterality: N/A;   EUS N/A 09/09/2015   Procedure: UPPER ENDOSCOPIC ULTRASOUND (EUS) RADIAL;  Surgeon: [name], MD;  Location: [facility];  Service: Endoscopy;  Laterality: N/A;   EYE SURGERY     INCONTINENCE SURGERY     SPINAL CORD STIMULATOR INSERTION     SPINAL CORD STIMULATOR REMOVAL     SPINAL FUSION  2008   times  two   TOTAL HIP ARTHROPLASTY Left 10/12/2020   Procedure: LEFT TOTAL HIP ARTHROPLASTY ANTERIOR APPROACH;  Surgeon: Leandrew Koyanagi, MD;  Location: [facility];  Service: Orthopedics;  Laterality: Left;   TUBAL LIGATION  2006   Social History   Socioeconomic History   Marital status: Legally Separated    Spouse name: Not on file   Number of children: Not on file   Years of education: Not on file   Highest education level: Not on file  Occupational History   Not on file  Tobacco Use   Smoking status: Light Smoker    Packs/day: 1.00    Years: 10.00    Pack years: 10.00    Types: Cigarettes    Last attempt to quit: 02/09/2016    Years since quitting: 5.0   Smokeless tobacco: Never   Tobacco comments:    use vapor, social  Vaping Use   Vaping Use: Never used  Substance and Sexual Activity   Alcohol use: No   Drug use: No   Sexual activity: Yes    Birth control/protection: Surgical  Other Topics Concern   Not on file  Social History Narrative   Not  on file   Social Determinants of Health   Financial Resource Strain: Medium Risk   Difficulty of Paying Living Expenses: Somewhat hard  Food Insecurity: Food Insecurity Present   Worried About Charity fundraiser in the Last Year: Sometimes true   Arboriculturist in the Last Year: Never true  Transportation Needs: Unmet Transportation Needs   Lack of Transportation (Medical): Yes   Lack of Transportation (Non-Medical): No  Physical Activity: Insufficiently Active   Days of Exercise per Week: 3 days   Minutes of Exercise per Session: 20 min  Stress: No Stress Concern Present   Feeling of Stress : Only a little  Social Connections: Socially Isolated   Frequency of Communication with Friends and Family: More than three times a week   Frequency of Social Gatherings with Friends and Family: More than three times a week   Attends Religious Services: Never   Marine scientist or Organizations: No   Attends Arts administrator: Never   Marital Status: Separated   Family History  Problem Relation Age of Onset   Schizophrenia Father    Schizophrenia Cousin    Hyperthyroidism Mother    Hypothyroidism Maternal Grandmother    Allergies  Allergen Reactions   Pregabalin Anaphylaxis   Celebrex [Celecoxib] Nausea And Vomiting    Stomach pain   Effexor [Venlafaxine Hcl] Other (See Comments)    Serotonin toxicity   Mirapex [Pramipexole]     Mirapex- Worsening "body jerks"   Pristiq [Desvenlafaxine] Other (See Comments)    Serotonin toxicity    Prozac [Fluoxetine Hcl] Swelling   Symbyax [Olanzapine-Fluoxetine Hcl] Swelling   Wellbutrin [Bupropion Hcl] Swelling   Prior to Admission medications   Medication Sig Start Date End Date Taking? Authorizing Provider  ALPRAZolam Duanne Moron) 0.5 MG tablet Take 0.5 mg by mouth 2 (two) times daily as needed for anxiety. 12/20/19   [provider]  ARIPiprazole (ABILIFY) 5 MG tablet Take 1 tablet (5 mg total) by mouth daily. 09/17/15   Clarene Reamer, MD  atorvastatin (LIPITOR) 20 MG tablet TAKE 1 TABLET(20 MG) BY MOUTH DAILY Patient taking differently: Take 20 mg by mouth daily. TAKE 1 TABLET(20 MG) BY MOUTH DAILY 07/07/20   Libby Maw, MD  blood glucose meter kit and supplies KIT Dispense based on patient and insurance preference. Check glucose before breakfast. ICD: E11.65 04/02/20   Nche, Charlene Brooke, NP  chlorpheniramine-HYDROcodone (TUSSIONEX PENNKINETIC ER) 10-8 MG/5ML SUER Take 5 mLs by mouth every 12 (twelve) hours as needed. 01/31/21   Molpus, John, MD  clomiPRAMINE (ANAFRANIL) 50 MG capsule Take 100 mg by mouth at bedtime.    [provider]  doxycycline (VIBRAMYCIN) 100 MG capsule Take 1 capsule (100 mg total) by mouth 2 (two) times daily. One po bid x 7 days 01/31/21   Molpus, John, MD  FINACEA 15 % FOAM Apply 1 application topically 2 (two) times daily as needed (rosacea). 02/14/20   [provider]  gabapentin (NEURONTIN)  300 MG capsule TAKE 1 CAPSULE(300 MG) BY MOUTH THREE TIMES DAILY 12/07/20   Dutch Quint B, FNP  glucose blood test strip Use as instructed, check glucose before breakfast. ICD: E11.65 07/21/20   Libby Maw, MD  lamoTRIgine (LAMICTAL) 150 MG tablet Take 150 mg by mouth 2 (two) times daily. 02/22/20   [provider]  Lancets (FREESTYLE) lancets Use as instructed. E11.65 07/21/20   Libby Maw, MD  levothyroxine (SYNTHROID) 75 MCG tablet TAKE 1  TABLET(75 MCG) BY MOUTH DAILY BEFORE AND BREAKFAST 02/03/21   Libby Maw, MD  lisinopril (ZESTRIL) 10 MG tablet TAKE 1 TABLET(10 MG) BY MOUTH DAILY 02/02/21   Libby Maw, MD  metFORMIN (GLUCOPHAGE) 500 MG tablet Take 1 tablet (500 mg total) by mouth 2 (two) times daily with a meal. 04/08/20   Libby Maw, MD  methylphenidate (RITALIN) 20 MG tablet Take 20 mg by mouth 2 (two) times daily. 09/22/20   [provider]  OVER THE COUNTER MEDICATION Take 2 tablets by mouth every 6 (six) hours as needed (headache). Headache Relief    [provider]  oxyCODONE-acetaminophen (PERCOCET) 10-325 MG tablet Take 1 tablet by mouth every 6 (six) hours as needed for pain. 10/13/20 10/13/21  Aundra Dubin, PA-C  pantoprazole (PROTONIX) 40 MG tablet Take 1 tablet by mouth daily. 01/06/21   [provider]  Semaglutide,0.25 or 0.5MG/DOS, 2 MG/1.5ML SOPN Inject 0.25 mg into the skin every Thursday. 07/14/20   [provider]     Positive ROS: Otherwise negative  All other systems have been reviewed and were otherwise negative with the exception of those mentioned in the HPI and as above.  Physical Exam: Constitutional: Alert, well-appearing, no acute distress Ears: External ears without lesions or tenderness. Ear canals are clear bilaterally with intact, clear TMs.  Nasal: External nose without lesions. Septum relatively midline with mild rhinitis.  After decongestion nose nasal  passages otherwise clear.. Oral: Lips and gums without lesions. Tongue and palate mucosa without lesions. Posterior oropharynx clear.  Indirect laryngoscopy revealed a clear base of tongue vallecula and epiglottis.  Vocal cords could be barely visualized posteriorly and had symmetric mobility.  Could get a hint of vocal cord nodules. Fiberoptic laryngoscopy was performed through the right nostril.  The nasopharynx was clear.  The base of tongue vallecula epiglottis were normal.  Vocal cords had symmetric mobility.  She had vocal cord nodules proximal little bit larger on the right compared to the left.  Had mild inflammatory changes. Neck: No palpable adenopathy or masses.  No palpable adenopathy in the neck. Respiratory: Breathing comfortably  Skin: No facial/neck lesions or rash noted.  Laryngoscopy vocal recommended  Date/Time: 02/22/2021 3:00 PM Performed by: Rozetta Nunnery, MD Authorized by: Rozetta Nunnery, MD   Procedure details:    Indications: hoarseness, dysphagia, or aspiration     Medication:  Afrin   Instrument: flexible fiberoptic laryngoscope     Scope location: right nare   Sinus:    Right nasopharynx: normal   Mouth:    Oropharynx: normal     Vallecula: normal     Base of tongue: normal     Epiglottis: normal   Throat:    True vocal cords: normal   Comments:     On fiberoptic laryngoscopy patient had vocal cord nodules bilaterally right side slightly larger than left.  Located anterior one third of the vocal cords.  Vocal cords otherwise has symmetric mobility.  Assessment: Vocal cord nodules  Plan: Recommended reduce smoking is much as possible.  Rest voice and drink plenty of liquids. I also prescribed a 6-day prednisone taper and reviewed with her concerning elevation of her blood sugars since she has type 2 diabetes. She will follow-up in 5 to 6 weeks for recheck.   Radene Journey, MD   CC:

## 2021-02-25 ENCOUNTER — Ambulatory Visit: Payer: Self-pay | Admitting: Urology

## 2021-02-28 ENCOUNTER — Other Ambulatory Visit: Payer: Self-pay

## 2021-02-28 ENCOUNTER — Ambulatory Visit
Admission: RE | Admit: 2021-02-28 | Discharge: 2021-02-28 | Disposition: A | Discharge status: Home / Self Care | Payer: PPO | Source: Ambulatory Visit | Attending: Physician Assistant | Admitting: Physician Assistant

## 2021-02-28 DIAGNOSIS — K820 Obstruction of gallbladder: Secondary | ICD-10-CM | POA: Diagnosis not present

## 2021-02-28 DIAGNOSIS — K8050 Calculus of bile duct without cholangitis or cholecystitis without obstruction: Secondary | ICD-10-CM

## 2021-02-28 DIAGNOSIS — R932 Abnormal findings on diagnostic imaging of liver and biliary tract: Secondary | ICD-10-CM

## 2021-02-28 DIAGNOSIS — R935 Abnormal findings on diagnostic imaging of other abdominal regions, including retroperitoneum: Secondary | ICD-10-CM | POA: Diagnosis not present

## 2021-02-28 MED ORDER — GADOBENATE DIMEGLUMINE 529 MG/ML IV SOLN
20.0000 mL | Freq: Once | INTRAVENOUS | Status: AC | PRN
  Administered 2021-02-28: 20 mL via INTRAVENOUS

## 2021-03-25 ENCOUNTER — Encounter: Payer: Self-pay | Admitting: Urology

## 2021-03-25 ENCOUNTER — Ambulatory Visit: Payer: PPO | Admitting: Urology

## 2021-03-25 ENCOUNTER — Other Ambulatory Visit: Payer: Self-pay

## 2021-03-25 VITALS — BP 114/69 | HR 87 | Ht 64.0 in | Wt 221.0 lb

## 2021-03-25 DIAGNOSIS — R319 Hematuria, unspecified: Secondary | ICD-10-CM | POA: Diagnosis not present

## 2021-03-25 DIAGNOSIS — N3281 Overactive bladder: Secondary | ICD-10-CM

## 2021-03-25 NOTE — Progress Notes (Signed)
03/25/21 11:14 AM   [name] 18-Oct-1970 [id number]  CC: Mixed incontinence, gross hematuria  HPI: I saw [name] for the above issues.  She is a very comorbid 50 year old female with a complex urologic history.  Her primary complaint today is mixed stress and urge incontinence that requires a depends over the last 6 to 12 months, as well as multiple episodes over the last few months of gross hematuria in her underwear, and when she wipes.  She had a urinalysis with PCP in April 2022 that showed microscopic hematuria with 3-6 RBCs but no evidence of infection.  Her urologic history is notable for a bladder surgery with mesh in 2008 that sounds like it was complicated by erosion of the mesh into the vagina, which required exploration and repair with partial excision of mesh.  Apparently she was told at some point that she would have to manage her bladder long-term with intermittent catheterization, but it does not sound like she was ever routinely catheterizing.  She really denied any significant urinary symptoms up to about a year ago when the incontinence, urgency/frequency started.  This did correlate with significant amount of weight gain.  She also drinks at least 1 L of Diet Coke a day in addition to coffee.  Urinalysis today is completely benign with 0-5 WBCs, 0-2 RBCs, no bacteria, nitrate negative, no leukocytes.  Recent MRI abdominal imaging for abdominal pain showed no renal abnormalities or hydronephrosis.   PMH: Past Medical History:  Diagnosis Date   ADHD (attention deficit hyperactivity disorder)    Anemia    with pregnancy   Anxiety    Arthritis    Bipolar disorder (HCC)    Chronic back pain greater than 3 months duration    Depression    Diabetes mellitus without complication ([place])    Diabetic nephropathy ([place])    bilateral feet   Dysuria    has to have self in and out cath    Eroded bladder suspension mesh (HCC)    GERD (gastroesophageal reflux  disease)    Hepatitis    C   History of COVID-19 11/2019   History of MRSA infection    after back surgery   History of sleep apnea    resolved after weight loss   Hypertension    Hypothyroidism    Leg pain, bilateral    Obesity    Obsessive-compulsive disorder    Pneumonia 12/24/2019   Rosacea    Sciatic nerve pain    Social anxiety disorder    Squamous cell carcinoma of back 2021    Surgical History: Past Surgical History:  Procedure Laterality Date   BACK SURGERY     total of 6 back surgeries   bladder mesh  2009   DILITATION & CURRETTAGE/HYSTROSCOPY WITH NOVASURE ABLATION N/A 08/19/2016   Procedure: DILATATION & CURETTAGE WITH NOVASURE ABLATION;  Surgeon: [name], MD;  Location: [facility];  Service: Gynecology;  Laterality: N/A;   EUS N/A 09/09/2015   Procedure: UPPER ENDOSCOPIC ULTRASOUND (EUS) RADIAL;  Surgeon: [name], MD;  Location[facility];  Service: Endoscopy;  Laterality: N/A;   EYE SURGERY     INCONTINENCE SURGERY     SPINAL CORD STIMULATOR INSERTION     SPINAL CORD STIMULATOR REMOVAL     SPINAL FUSION  2008   times two   TOTAL HIP ARTHROPLASTY Left 10/12/2020   Procedure: LEFT TOTAL HIP ARTHROPLASTY ANTERIOR APPROACH;  Surgeon: Leandrew Koyanagi, MD;  Location: [facility];  Service: Orthopedics;  Laterality: Left;   TUBAL LIGATION  2006    Family History: Family History  Problem Relation Age of Onset   Schizophrenia Father    Schizophrenia Cousin    Hyperthyroidism Mother    Hypothyroidism Maternal Grandmother     Social History:  reports that she has been smoking cigarettes. She has a 10.00 pack-year smoking history. She has never used smokeless tobacco. She reports that she does not drink alcohol and does not use drugs.  Physical Exam: BP 114/69 (BP Location: Left Arm, Patient Position: Sitting, Cuff Size: Large)   Pulse 87   Ht 5\' 4"  (1.626 m)   Wt 221 lb (100.2 kg)   BMI 37.93 kg/m    Constitutional:  Alert and oriented, No acute  distress. Cardiovascular: No clubbing, cyanosis, or edema. Respiratory: Normal respiratory effort, no increased work of breathing. GI: Abdomen is soft, nontender, nondistended, no abdominal masses  Laboratory Data: Reviewed, see HPI  Pertinent Imaging: I have personally viewed and interpreted the MRI from June 2022 that shows no renal abnormalities or hydronephrosis.  Assessment & Plan:   50 year old comorbid female with reported gross hematuria, microscopic hematuria in April with 3-6 RBCs, and severe stress and urge incontinence requiring depends.  Her urologic history is notable for a bladder surgery with mesh in 2008, followed by mesh erosion into the vagina that required exploration and partial excision of the mesh in 2009.  Those notes are unavailable to me.  I recommended referral to my partner Dr. Matilde Sprang for further work-up.  I think she will likely require a cystoscopy for further evaluation of the microscopic and gross hematuria, especially in the setting of her prior bladder mesh surgery and erosion.  Regarding her incontinence, I recommended starting with behavioral strategies of cutting back on her large volume of diet sodas to see if this improves some of her urgency.  I would defer to Dr. Matilde Sprang consideration of starting an OAB medication pending how she responds to the simple behavioral strategies, and further work-up of her hematuria with pelvic exam, likely cystoscopy, and potential CT urogram    Nickolas Madrid, MD 03/25/2021  [facility] [address], [place], [place] [address] [phone]

## 2021-03-25 NOTE — Patient Instructions (Signed)

## 2021-03-26 LAB — URINALYSIS, COMPLETE
Bilirubin, UA: NEGATIVE
Glucose, UA: NEGATIVE
Ketones, UA: NEGATIVE
Leukocytes,UA: NEGATIVE
Nitrite, UA: NEGATIVE
Protein,UA: NEGATIVE
Specific Gravity, UA: <1.005 — ABNORMAL LOW (ref 1.005–1.030)
Urobilinogen, Ur: 0.2 mg/dL (ref 0.2–1.0)
pH, UA: 6 (ref 5.0–7.5)

## 2021-03-26 LAB — MICROSCOPIC EXAMINATION: Bacteria, UA: NONE SEEN

## 2021-03-30 ENCOUNTER — Other Ambulatory Visit: Payer: Self-pay | Admitting: Family Medicine

## 2021-03-30 ENCOUNTER — Ambulatory Visit: Payer: PPO | Admitting: Family Medicine

## 2021-03-30 DIAGNOSIS — E1165 Type 2 diabetes mellitus with hyperglycemia: Secondary | ICD-10-CM

## 2021-04-05 ENCOUNTER — Ambulatory Visit: Payer: PPO | Admitting: Family Medicine

## 2021-04-06 ENCOUNTER — Ambulatory Visit: Payer: PPO | Admitting: Family Medicine

## 2021-04-07 ENCOUNTER — Other Ambulatory Visit: Payer: Self-pay

## 2021-04-07 ENCOUNTER — Ambulatory Visit (INDEPENDENT_AMBULATORY_CARE_PROVIDER_SITE_OTHER): Payer: PPO | Admitting: Otolaryngology

## 2021-04-07 ENCOUNTER — Encounter: Payer: Self-pay | Admitting: Orthopaedic Surgery

## 2021-04-07 ENCOUNTER — Ambulatory Visit (INDEPENDENT_AMBULATORY_CARE_PROVIDER_SITE_OTHER): Payer: PPO

## 2021-04-07 ENCOUNTER — Ambulatory Visit (INDEPENDENT_AMBULATORY_CARE_PROVIDER_SITE_OTHER): Payer: PPO | Admitting: Orthopaedic Surgery

## 2021-04-07 DIAGNOSIS — Z96642 Presence of left artificial hip joint: Secondary | ICD-10-CM

## 2021-04-07 NOTE — Progress Notes (Signed)
Post-Op Visit Note   Patient: [name]           Date of Birth: 08/08/1971           MRN: [id number] Visit Date: 04/07/2021 PCP: Claudia Maw, MD   Assessment & Plan:  Chief Complaint:  Chief Complaint  Patient presents with   Left Hip - Pain   Visit Diagnoses:  1. Status post left hip replacement     Plan: [name] is a 13-monthstatus post left total hip replacement.  She has no complaints or issues.  Very happy overall.  She has lost 80 pounds since I last saw her.  Left hip shows fully healed surgical scar.  Normal ambulation.  Full range of motion without pain.  Slight numbness to the lateral thigh.  X-rays unremarkable.  I am very happy that she lost 80 pounds and overall she feels much better and feeling great and looking great.  Dental prophylaxis reinforced.  Recheck in 6 months with AP pelvis x-rays.  Follow-Up Instructions: Return in about 6 months (around 10/08/2021).   Orders:  Orders Placed This Encounter  Procedures   XR Pelvis 1-2 Views   No orders of the defined types were placed in this encounter.   Imaging: XR Pelvis 1-2 Views  Result Date: 04/07/2021 Stable total hip replacement without complications   PMFS History: Patient Active Problem List   Diagnosis Date Noted   Lower respiratory infection 02/04/2021   Hematuria 12/28/2020   Status post total replacement of left hip 10/12/2020   Type 2 diabetes mellitus with hyperglycemia, without long-term current use of insulin ([facility] 04/02/2020   Peripheral edema 04/02/2020   COVID-19 07/09/2019   Left hip pain 04/30/2019   Otitis externa of left ear 04/30/2019   Cholesteatoma of left ear 04/30/2019   Dysfunction of left eustachian tube 04/30/2019   Left ear pain 04/30/2019   Sciatica 04/19/2019   Class 3 severe obesity due to excess calories with serious comorbidity and body mass index (BMI) of 45.0 to 49.9 in adult ([facility] 04/19/2019   Post-nasal drip 01/10/2019   Allergic cough 01/10/2019    Obstructive sleep apnea syndrome 12/11/2018   Acute frontal sinusitis 12/10/2018   Primary osteoarthritis of left hip 11/13/2018   Morbid obesity ([facility] 11/13/2018   Elevated cholesterol 11/12/2018   Essential hypertension 11/12/2018   Allergic rhinitis due to pollen 11/12/2018   Reactive airway disease 11/12/2018   Breast pain, right 07/17/2018   Prediabetes 07/17/2018   Refused influenza vaccine 07/17/2018   Chronic nonintractable headache 02/12/2018   Dysuria 02/12/2018   Localized edema 02/12/2018   Hospital discharge follow-up 01/23/2018   Bladder problem 01/05/2018   Depression 01/05/2018   Insomnia 01/05/2018   Hepatitis C infection 12/01/2015   RLS (restless legs syndrome) 10/23/2015   Elevated liver enzymes 10/22/2015   Recurrent major depression-severe ([facility] 04/17/2015   GAD (generalized anxiety disorder) 04/15/2015   Bipolar depression ([facility] 04/15/2015   Severe recurrent major depression w/psychotic features, mood-congruent ([facility] 04/14/2015   Anxiety disorder 04/14/2015   Major depressive disorder, recurrent severe without psychotic features ([facility] 04/14/2015   Suicidal ideations    Chronic LBP 01/02/2015   Adult hypothyroidism 06/25/2012   Cobalamin deficiency 06/25/2012   Fatigue 06/25/2012   Chronic arthralgias of knees and hips 06/25/2012   Leukocytosis 06/25/2012   Vitamin D deficiency 06/25/2012   Past Medical History:  Diagnosis Date   ADHD (attention deficit hyperactivity disorder)    Anemia    with  pregnancy   Anxiety    Arthritis    Bipolar disorder (HCC)    Chronic back pain greater than 3 months duration    Depression    Diabetes mellitus without complication (HCC)    Diabetic nephropathy (HCC)    bilateral feet   Dysuria    has to have self in and out cath    Eroded bladder suspension mesh (HCC)    GERD (gastroesophageal reflux disease)    Hepatitis    C   History of COVID-19 11/2019   History of MRSA infection    after back surgery    History of sleep apnea    resolved after weight loss   Hypertension    Hypothyroidism    Leg pain, bilateral    Obesity    Obsessive-compulsive disorder    Pneumonia 12/24/2019   Rosacea    Sciatic nerve pain    Social anxiety disorder    Squamous cell carcinoma of back 2021    Family History  Problem Relation Age of Onset   Schizophrenia Father    Schizophrenia Cousin    Hyperthyroidism Mother    Hypothyroidism Maternal Grandmother     Past Surgical History:  Procedure Laterality Date   BACK SURGERY     total of 6 back surgeries   bladder mesh  2009   DILITATION & CURRETTAGE/HYSTROSCOPY WITH NOVASURE ABLATION N/A 08/19/2016   Procedure: DILATATION & CURETTAGE WITH NOVASURE ABLATION;  Surgeon: Claudia Hipp, MD;  Location: [facility];  Service: Gynecology;  Laterality: N/A;   EUS N/A 09/09/2015   Procedure: UPPER ENDOSCOPIC ULTRASOUND (EUS) RADIAL;  Surgeon: Claudia Silence, MD;  [facility];  Service: Endoscopy;  Laterality: N/A;   EYE SURGERY     INCONTINENCE SURGERY     SPINAL CORD STIMULATOR INSERTION     SPINAL CORD STIMULATOR REMOVAL     SPINAL FUSION  2008   times two   TOTAL HIP ARTHROPLASTY Left 10/12/2020   Procedure: LEFT TOTAL HIP ARTHROPLASTY ANTERIOR APPROACH;  Surgeon: Claudia Koyanagi, MD;  Location: [facility];  Service: Orthopedics;  Laterality: Left;   TUBAL LIGATION  2006   Social History   Occupational History   Not on file  Tobacco Use   Smoking status: Light Smoker    Packs/day: 1.00    Years: 15.00    Pack years: 15.00    Types: Cigarettes    Last attempt to quit: 02/09/2016    Years since quitting: 5.1   Smokeless tobacco: Never   Tobacco comments:    use vapor, social  Vaping Use   Vaping Use: Never used  Substance and Sexual Activity   Alcohol use: No   Drug use: No   Sexual activity: Yes    Birth control/protection: Surgical

## 2021-04-12 ENCOUNTER — Other Ambulatory Visit: Payer: Self-pay

## 2021-04-12 ENCOUNTER — Encounter: Payer: Self-pay | Admitting: Urology

## 2021-04-12 ENCOUNTER — Ambulatory Visit: Payer: PPO | Admitting: Urology

## 2021-04-12 VITALS — BP 121/74 | HR 94 | Ht 64.0 in | Wt 221.0 lb

## 2021-04-12 DIAGNOSIS — R310 Gross hematuria: Secondary | ICD-10-CM

## 2021-04-12 DIAGNOSIS — N3281 Overactive bladder: Secondary | ICD-10-CM | POA: Diagnosis not present

## 2021-04-12 LAB — URINALYSIS, COMPLETE
Bilirubin, UA: NEGATIVE
Glucose, UA: NEGATIVE
Ketones, UA: NEGATIVE
Nitrite, UA: NEGATIVE
Protein,UA: NEGATIVE
Specific Gravity, UA: <1.005 — ABNORMAL LOW (ref 1.005–1.030)
Urobilinogen, Ur: 0.2 mg/dL (ref 0.2–1.0)
pH, UA: 6.5 (ref 5.0–7.5)

## 2021-04-12 LAB — MICROSCOPIC EXAMINATION

## 2021-04-12 NOTE — Patient Instructions (Signed)
Will call you with CT results

## 2021-04-12 NOTE — Progress Notes (Signed)
04/12/2021 9:15 AM   [name] December 12, 1970 [id number]  Referring provider: Libby Maw, MD [place],  [place] [address]  No chief complaint on file.   HPI: [name]: Stress urge incontinence; gross hematuria with wiping; mesh bladder surgery 2008 with mesh erosion in the vagina.  Was recommended that she may need to perform clean intermittent catheterization in the future.  Normal kidneys on recent MRI of abdomen and no microscopic hematuria  Today Patient leaks with coughing sneezing but not bending lifting.  She sometimes has urge incontinence.  I do not think she has bedwetting unless she coughs.  Wears 3-4 pads a day sometimes damp sometimes soaked  She drinks a lot of water and voids every 4 to 5 minutes.  She cannot hold it for 2 hours.  She gets up 3 times at night.  She reports a good flow.  She does not feel empty  She describes vaginal mesh surgery and was involved with the lawsuit.  She has been told that there is mesh in the vagina but never had it removed.  She has no pain or bleeding with sexual activity  Intermittently she will see a small amount of blood on her pad.  She is not standing for a month.  She was told for incomplete bladder emptying years ago she may need to self catheterize.  She has had 6 back operations.  She is on oral hypoglycemics.  She denies history of kidney stones and kidney surgery.  No urinary tract infections   PMH: Past Medical History:  Diagnosis Date   ADHD (attention deficit hyperactivity disorder)    Anemia    with pregnancy   Anxiety    Arthritis    Bipolar disorder (HCC)    Chronic back pain greater than 3 months duration    Depression    Diabetes mellitus without complication (HCC)    Diabetic nephropathy (HCC)    bilateral feet   Dysuria    has to have self in and out cath    Eroded bladder suspension mesh (HCC)    GERD (gastroesophageal reflux disease)    Hepatitis    C   History  of COVID-19 11/2019   History of MRSA infection    after back surgery   History of sleep apnea    resolved after weight loss   Hypertension    Hypothyroidism    Leg pain, bilateral    Obesity    Obsessive-compulsive disorder    Pneumonia 12/24/2019   Rosacea    Sciatic nerve pain    Social anxiety disorder    Squamous cell carcinoma of back 2021    Surgical History: Past Surgical History:  Procedure Laterality Date   BACK SURGERY     total of 6 back surgeries   bladder mesh  2009   DILITATION & CURRETTAGE/HYSTROSCOPY WITH NOVASURE ABLATION N/A 08/19/2016   Procedure: DILATATION & CURETTAGE WITH NOVASURE ABLATION;  Surgeon: [name], MD;  Location: [facility];  Service: Gynecology;  Laterality: N/A;   EUS N/A 09/09/2015   Procedure: UPPER ENDOSCOPIC ULTRASOUND (EUS) RADIAL;  Surgeon: [name], MD;  Location: [facility];  Service: Endoscopy;  Laterality: N/A;   EYE SURGERY     INCONTINENCE SURGERY     SPINAL CORD STIMULATOR INSERTION     SPINAL CORD STIMULATOR REMOVAL     SPINAL FUSION  2008   times two   TOTAL HIP ARTHROPLASTY Left 10/12/2020   Procedure: LEFT TOTAL HIP  ARTHROPLASTY ANTERIOR APPROACH;  Surgeon: Leandrew Koyanagi, MD;  Location: [facility];  Service: Orthopedics;  Laterality: Left;   TUBAL LIGATION  2006    Home Medications:  Allergies as of 04/12/2021       Reactions   Pregabalin Anaphylaxis   Celebrex [celecoxib] Nausea And Vomiting   Stomach pain   Effexor [venlafaxine Hcl] Other (See Comments)   Serotonin toxicity   Mirapex [pramipexole]    Mirapex- Worsening "body jerks"   Pristiq [desvenlafaxine] Other (See Comments)   Serotonin toxicity    Prozac [fluoxetine Hcl] Swelling   Symbyax [olanzapine-fluoxetine Hcl] Swelling   Wellbutrin [bupropion Hcl] Swelling        Medication List        Accurate as of April 12, 2021  9:15 AM. If you have any questions, ask your nurse or doctor.          ALPRAZolam 0.5 MG tablet Commonly known as:  XANAX Take 0.5 mg by mouth 2 (two) times daily as needed for anxiety.   ARIPiprazole 5 MG tablet Commonly known as: ABILIFY Take 1 tablet (5 mg total) by mouth daily.   atorvastatin 20 MG tablet Commonly known as: LIPITOR TAKE 1 TABLET(20 MG) BY MOUTH DAILY What changed:  how much to take how to take this when to take this   blood glucose meter kit and supplies Kit Dispense based on patient and insurance preference. Check glucose before breakfast. ICD: E11.65   clomiPRAMINE 50 MG capsule Commonly known as: ANAFRANIL Take 100 mg by mouth at bedtime.   diclofenac Sodium 1 % Gel Commonly known as: VOLTAREN Apply topically.   Finacea 15 % Foam Generic drug: Azelaic Acid Apply 1 application topically 2 (two) times daily as needed (rosacea).   freestyle lancets Use as instructed. E11.65   gabapentin 300 MG capsule Commonly known as: NEURONTIN TAKE 1 CAPSULE(300 MG) BY MOUTH THREE TIMES DAILY   glucose blood test strip Use as instructed, check glucose before breakfast. ICD: E11.65   lamoTRIgine 150 MG tablet Commonly known as: LAMICTAL Take 150 mg by mouth 2 (two) times daily.   levothyroxine 75 MCG tablet Commonly known as: SYNTHROID TAKE 1 TABLET(75 MCG) BY MOUTH DAILY BEFORE AND BREAKFAST   lisinopril 10 MG tablet Commonly known as: ZESTRIL TAKE 1 TABLET(10 MG) BY MOUTH DAILY   metFORMIN 500 MG tablet Commonly known as: GLUCOPHAGE TAKE 1 TABLET(500 MG) BY MOUTH TWICE DAILY WITH A MEAL   methylphenidate 20 MG tablet Commonly known as: RITALIN Take 20 mg by mouth 2 (two) times daily.   OVER THE COUNTER MEDICATION Take 2 tablets by mouth every 6 (six) hours as needed (headache). Headache Relief   pantoprazole 40 MG tablet Commonly known as: PROTONIX Take 1 tablet by mouth daily.   Semaglutide(0.25 or 0.5MG/DOS) 2 MG/1.5ML Sopn Inject 0.25 mg into the skin every Thursday.        Allergies:  Allergies  Allergen Reactions   Pregabalin Anaphylaxis    Celebrex [Celecoxib] Nausea And Vomiting    Stomach pain   Effexor [Venlafaxine Hcl] Other (See Comments)    Serotonin toxicity   Mirapex [Pramipexole]     Mirapex- Worsening "body jerks"   Pristiq [Desvenlafaxine] Other (See Comments)    Serotonin toxicity    Prozac [Fluoxetine Hcl] Swelling   Symbyax [Olanzapine-Fluoxetine Hcl] Swelling   Wellbutrin [Bupropion Hcl] Swelling    Family History: Family History  Problem Relation Age of Onset   Schizophrenia Father    Schizophrenia Cousin  Hyperthyroidism Mother    Hypothyroidism Maternal Grandmother     Social History:  reports that she has been smoking cigarettes. She has a 15.00 pack-year smoking history. She has never used smokeless tobacco. She reports that she does not drink alcohol and does not use drugs.  ROS:                                        Physical Exam: There were no vitals taken for this visit.  Constitutional:  Alert and oriented, No acute distress. HEENT: [place] AT, moist mucus membranes.  Trachea midline, no masses. Cardiovascular: No clubbing, cyanosis, or edema. Respiratory: Normal respiratory effort, no increased work of breathing. GI: Abdomen is soft, nontender, nondistended, no abdominal masses GU: Mild grade 2 hypermobility the bladder neck with negative cough test on pelvic examination.  No mesh in the vagina anteriorly or posteriorly.  I do good look inside the vagina but I cannot see the apex.  Tissues look very healthy.  No blood on tip of speculum Skin: No rashes, bruises or suspicious lesions. Lymph: No cervical or inguinal adenopathy. Neurologic: Grossly intact, no focal deficits, moving all 4 extremities. Psychiatric: Normal mood and affect.  Laboratory Data: Lab Results  Component Value Date   WBC 10.6 (H) 12/16/2020   HGB 13.4 12/16/2020   HCT 40.8 12/16/2020   MCV 78.2 12/16/2020   PLT 317.0 12/16/2020    Lab Results  Component Value Date   CREATININE 0.69  12/16/2020    No results found for: PSA  No results found for: TESTOSTERONE  Lab Results  Component Value Date   HGBA1C 5.5 12/16/2020    Urinalysis    Component Value Date/Time   COLORURINE YELLOW 12/16/2020 1137   APPEARANCEUR Clear 03/25/2021 1145   LABSPEC <=1.005 (A) 12/16/2020 1137   PHURINE 5.0 12/16/2020 1137   GLUCOSEU Negative 03/25/2021 1145   GLUCOSEU NEGATIVE 12/16/2020 1137   HGBUR SMALL (A) 12/16/2020 1137   BILIRUBINUR Negative 03/25/2021 1145   KETONESUR NEGATIVE 12/16/2020 1137   PROTEINUR Negative 03/25/2021 1145   PROTEINUR NEGATIVE 10/12/2020 0546   UROBILINOGEN 0.2 12/16/2020 1137   NITRITE Negative 03/25/2021 1145   NITRITE NEGATIVE 12/16/2020 1137   LEUKOCYTESUR Negative 03/25/2021 1145   LEUKOCYTESUR NEGATIVE 12/16/2020 1137    Pertinent Imaging: Urine reviewed.  Urine sent for culture.  Chart reviewed.  Assessment & Plan: Patient has mixed incontinence.  She has had hematuria.  The MRI was on the abdomen.  CT scan ordered hematuria.  Call if abnormal.  Order urodynamics and come back for cystoscopy and proceed accordingly  There are no diagnoses linked to this encounter.  No follow-ups on file.  Reece Packer, MD  [facility] [address], [place], [place] [address] [phone]

## 2021-04-13 DIAGNOSIS — Z6841 Body Mass Index (BMI) 40.0 and over, adult: Secondary | ICD-10-CM | POA: Diagnosis not present

## 2021-04-15 ENCOUNTER — Telehealth: Payer: Self-pay

## 2021-04-15 DIAGNOSIS — F411 Generalized anxiety disorder: Secondary | ICD-10-CM | POA: Diagnosis not present

## 2021-04-15 DIAGNOSIS — F909 Attention-deficit hyperactivity disorder, unspecified type: Secondary | ICD-10-CM | POA: Diagnosis not present

## 2021-04-15 DIAGNOSIS — F429 Obsessive-compulsive disorder, unspecified: Secondary | ICD-10-CM | POA: Diagnosis not present

## 2021-04-15 DIAGNOSIS — F3132 Bipolar disorder, current episode depressed, moderate: Secondary | ICD-10-CM | POA: Diagnosis not present

## 2021-04-15 LAB — CULTURE, URINE COMPREHENSIVE

## 2021-04-15 MED ORDER — SULFAMETHOXAZOLE-TRIMETHOPRIM 800-160 MG PO TABS: 1.0000 | ORAL_TABLET | Freq: Two times a day (BID) | ORAL | 0 refills | Status: DC

## 2021-04-15 NOTE — Telephone Encounter (Signed)
Pt aware . Medication sent to pharmacy. Pt verbalized understanding.

## 2021-04-15 NOTE — Telephone Encounter (Signed)
-----   Message from Bjorn Loser, MD sent at 04/15/2021 12:39 PM EDT ----- Sarina Ill DS 1 tablet twice a day for 7 days  ----- Message ----- From: Alvera Novel, CMA Sent: 04/15/2021  10:24 AM EDT To: Bjorn Loser, MD   ----- Message ----- From: Interface, [facility] Lab Results In Sent: 04/12/2021   4:36 PM EDT To: [facility] Clinical

## 2021-04-29 DIAGNOSIS — N3946 Mixed incontinence: Secondary | ICD-10-CM | POA: Diagnosis not present

## 2021-05-03 ENCOUNTER — Encounter: Payer: Self-pay | Admitting: Family Medicine

## 2021-05-03 ENCOUNTER — Other Ambulatory Visit: Payer: Self-pay | Admitting: Urology

## 2021-05-03 ENCOUNTER — Other Ambulatory Visit: Payer: Self-pay

## 2021-05-03 ENCOUNTER — Ambulatory Visit (INDEPENDENT_AMBULATORY_CARE_PROVIDER_SITE_OTHER): Payer: PPO | Admitting: Family Medicine

## 2021-05-03 VITALS — BP 120/74 | HR 86 | Temp 97.5°F | Ht 64.0 in | Wt 211.0 lb

## 2021-05-03 DIAGNOSIS — L818 Other specified disorders of pigmentation: Secondary | ICD-10-CM | POA: Diagnosis not present

## 2021-05-03 DIAGNOSIS — R7989 Other specified abnormal findings of blood chemistry: Secondary | ICD-10-CM

## 2021-05-03 NOTE — Progress Notes (Signed)
Established Patient Office Visit  Subjective:  Patient ID: [name], female    DOB: [birth year]  Age: 50 y.o. MRN: [id number]  CC:  Chief Complaint  Patient presents with   Eye Problem    C/O dark circles under eyes x few months.     HPI [name] presents for evaluation of dark circles under her eyes that have been present roughly since her hip surgery back in February.  Hypothyroidism is well controlled.  She is not anemic.  More recently she has been getting up to urinate roughly every 2 hours with her history of stress incontinence.  She is working with urology about this.  Her 73 year old son and significant other are expecting a child in January.  They are both living with her.  Past Medical History:  Diagnosis Date   ADHD (attention deficit hyperactivity disorder)    Anemia    with pregnancy   Anxiety    Arthritis    Bipolar disorder (HCC)    Chronic back pain greater than 3 months duration    Depression    Diabetes mellitus without complication (HCC)    Diabetic nephropathy ([place])    bilateral feet   Dysuria    has to have self in and out cath    Eroded bladder suspension mesh (HCC)    GERD (gastroesophageal reflux disease)    Hepatitis    C   History of COVID-19 11/2019   History of MRSA infection    after back surgery   History of sleep apnea    resolved after weight loss   Hypertension    Hypothyroidism    Leg pain, bilateral    Obesity    Obsessive-compulsive disorder    Pneumonia 12/24/2019   Rosacea    Sciatic nerve pain    Social anxiety disorder    Squamous cell carcinoma of back 2021    Past Surgical History:  Procedure Laterality Date   BACK SURGERY     total of 6 back surgeries   bladder mesh  2009   DILITATION & CURRETTAGE/HYSTROSCOPY WITH NOVASURE ABLATION N/A 08/19/2016   Procedure: DILATATION & CURETTAGE WITH NOVASURE ABLATION;  Surgeon: [name], MD;  Location: [facility];  Service: Gynecology;  Laterality: N/A;   EUS N/A  09/09/2015   Procedure: UPPER ENDOSCOPIC ULTRASOUND (EUS) RADIAL;  Surgeon: [name], MD;  Location: [facility];  Service: Endoscopy;  Laterality: N/A;   EYE SURGERY     INCONTINENCE SURGERY     SPINAL CORD STIMULATOR INSERTION     SPINAL CORD STIMULATOR REMOVAL     SPINAL FUSION  2008   times two   TOTAL HIP ARTHROPLASTY Left 10/12/2020   Procedure: LEFT TOTAL HIP ARTHROPLASTY ANTERIOR APPROACH;  Surgeon: Leandrew Koyanagi, MD;  Location: [facility];  Service: Orthopedics;  Laterality: Left;   TUBAL LIGATION  2006    Family History  Problem Relation Age of Onset   Schizophrenia Father    Schizophrenia Cousin    Hyperthyroidism Mother    Hypothyroidism Maternal Grandmother     Social History   Socioeconomic History   Marital status: Legally Separated    Spouse name: Not on file   Number of children: Not on file   Years of education: Not on file   Highest education level: Not on file  Occupational History   Not on file  Tobacco Use   Smoking status: Light Smoker    Packs/day: 1.00    Years: 15.00  Pack years: 15.00    Types: Cigarettes    Last attempt to quit: 02/09/2016    Years since quitting: 5.2   Smokeless tobacco: Never   Tobacco comments:    use vapor, social  Vaping Use   Vaping Use: Never used  Substance and Sexual Activity   Alcohol use: No   Drug use: No   Sexual activity: Yes    Birth control/protection: Surgical  Other Topics Concern   Not on file  Social History Narrative   Not on file   Social Determinants of Health   Financial Resource Strain: Medium Risk   Difficulty of Paying Living Expenses: Somewhat hard  Food Insecurity: Food Insecurity Present   Worried About Running Out of Food in the Last Year: Sometimes true   Ran Out of Food in the Last Year: Never true  Transportation Needs: Unmet Transportation Needs   Lack of Transportation (Medical): Yes   Lack of Transportation (Non-Medical): No  Physical Activity: Insufficiently Active   Days  of Exercise per Week: 3 days   Minutes of Exercise per Session: 20 min  Stress: No Stress Concern Present   Feeling of Stress : Only a little  Social Connections: Socially Isolated   Frequency of Communication with Friends and Family: More than three times a week   Frequency of Social Gatherings with Friends and Family: More than three times a week   Attends Religious Services: Never   Active Member of Clubs or Organizations: No   Attends Club or Organization Meetings: Never   Marital Status: Separated  Intimate Partner Violence: Not At Risk   Fear of Current or Ex-Partner: No   Emotionally Abused: No   Physically Abused: No   Sexually Abused: No    Outpatient Medications Prior to Visit  Medication Sig Dispense Refill   ALPRAZolam (XANAX) 0.5 MG tablet Take 0.5 mg by mouth 2 (two) times daily as needed for anxiety.     ARIPiprazole (ABILIFY) 5 MG tablet Take 1 tablet (5 mg total) by mouth daily. 30 tablet 2   atorvastatin (LIPITOR) 20 MG tablet TAKE 1 TABLET(20 MG) BY MOUTH DAILY (Patient taking differently: Take 20 mg by mouth daily. TAKE 1 TABLET(20 MG) BY MOUTH DAILY) 100 tablet 3   blood glucose meter kit and supplies KIT Dispense based on patient and insurance preference. Check glucose before breakfast. ICD: E11.65 1 each 0   clomiPRAMINE (ANAFRANIL) 50 MG capsule Take 100 mg by mouth at bedtime.     diclofenac Sodium (VOLTAREN) 1 % GEL Apply topically.     FINACEA 15 % FOAM Apply 1 application topically 2 (two) times daily as needed (rosacea).     gabapentin (NEURONTIN) 300 MG capsule TAKE 1 CAPSULE(300 MG) BY MOUTH THREE TIMES DAILY 90 capsule 2   glucose blood test strip Use as instructed, check glucose before breakfast. ICD: E11.65 100 each 12   lamoTRIgine (LAMICTAL) 150 MG tablet Take 150 mg by mouth 2 (two) times daily.     Lancets (FREESTYLE) lancets Use as instructed. E11.65 100 each 12   levothyroxine (SYNTHROID) 75 MCG tablet TAKE 1 TABLET(75 MCG) BY MOUTH DAILY BEFORE  AND BREAKFAST 90 tablet 0   lisinopril (ZESTRIL) 10 MG tablet TAKE 1 TABLET(10 MG) BY MOUTH DAILY 90 tablet 3   metFORMIN (GLUCOPHAGE) 500 MG tablet TAKE 1 TABLET(500 MG) BY MOUTH TWICE DAILY WITH A MEAL 180 tablet 0   methylphenidate (RITALIN) 20 MG tablet Take 20 mg by mouth 2 (two) times daily.       OVER THE COUNTER MEDICATION Take 2 tablets by mouth every 6 (six) hours as needed (headache). Headache Relief     pantoprazole (PROTONIX) 40 MG tablet Take 1 tablet by mouth daily.     Semaglutide,0.25 or 0.5MG/DOS, 2 MG/1.5ML SOPN Inject 0.25 mg into the skin every Thursday.     sulfamethoxazole-trimethoprim (BACTRIM DS) 800-160 MG tablet Take 1 tablet by mouth every 12 (twelve) hours. 14 tablet 0   No facility-administered medications prior to visit.    Allergies  Allergen Reactions   Pregabalin Anaphylaxis   Celebrex [Celecoxib] Nausea And Vomiting    Stomach pain   Effexor [Venlafaxine Hcl] Other (See Comments)    Serotonin toxicity   Mirapex [Pramipexole]     Mirapex- Worsening "body jerks"   Pristiq [Desvenlafaxine] Other (See Comments)    Serotonin toxicity    Prozac [Fluoxetine Hcl] Swelling   Symbyax [Olanzapine-Fluoxetine Hcl] Swelling   Wellbutrin [Bupropion Hcl] Swelling    ROS Review of Systems  Constitutional: Negative.   HENT: Negative.    Eyes:  Negative for photophobia and visual disturbance.  Respiratory: Negative.    Cardiovascular: Negative.   Gastrointestinal: Negative.   Genitourinary:  Positive for frequency and urgency.  Skin:  Positive for color change.  Psychiatric/Behavioral:  Positive for sleep disturbance.      Objective:    Physical Exam Vitals and nursing note reviewed.  Constitutional:      General: She is not in acute distress.    Appearance: Normal appearance. She is not ill-appearing, toxic-appearing or diaphoretic.  HENT:     Head: Normocephalic and atraumatic.     Right Ear: External ear normal.     Left Ear: External ear normal.   Eyes:     General: No scleral icterus.       Right eye: No discharge.        Left eye: No discharge.     Extraocular Movements: Extraocular movements intact.     Conjunctiva/sclera: Conjunctivae normal.     Pupils: Pupils are equal, round, and reactive to light.   Pulmonary:     Effort: Pulmonary effort is normal.  Skin:    General: Skin is warm and dry.  Neurological:     Mental Status: She is alert and oriented to person, place, and time.  Psychiatric:        Mood and Affect: Mood normal.        Behavior: Behavior normal.    BP 120/74 (BP Location: Right Arm, Patient Position: Sitting, Cuff Size: Large)   Pulse 86   Temp (!) 97.5 F (36.4 C) (Temporal)   Ht 5' 4" (1.626 m)   Wt 211 lb (95.7 kg)   SpO2 98%   BMI 36.22 kg/m  Wt Readings from Last 3 Encounters:  05/03/21 211 lb (95.7 kg)  04/12/21 221 lb (100.2 kg)  03/25/21 221 lb (100.2 kg)     Health Maintenance Due  Topic Date Due   HIV Screening  Never done   Pneumococcal Vaccine 53-66 Years old (2 - PCV) 10/21/2017   COLONOSCOPY (Pts 45-61yr Insurance coverage will need to be confirmed)  Never done   INFLUENZA VACCINE  04/05/2021    There are no preventive care reminders to display for this patient.  Lab Results  Component Value Date   TSH 1.78 12/16/2020   Lab Results  Component Value Date   WBC 10.6 (H) 12/16/2020   HGB 13.4 12/16/2020   HCT 40.8 12/16/2020   MCV 78.2 12/16/2020  PLT 317.0 12/16/2020   Lab Results  Component Value Date   NA 136 12/16/2020   K 4.5 12/16/2020   CO2 30 12/16/2020   GLUCOSE 89 12/16/2020   BUN 17 12/16/2020   CREATININE 0.69 12/16/2020   BILITOT 0.3 12/16/2020   ALKPHOS 87 12/16/2020   AST 11 12/16/2020   ALT 17 12/16/2020   PROT 6.6 12/16/2020   ALBUMIN 3.9 12/16/2020   CALCIUM 9.8 12/16/2020   ANIONGAP 8 10/13/2020   GFR 102.93 12/16/2020   Lab Results  Component Value Date   CHOL 166 11/12/2018   Lab Results  Component Value Date   HDL 35.80  (L) 11/12/2018   Lab Results  Component Value Date   LDLCALC 174 (H) 04/15/2015   Lab Results  Component Value Date   TRIG 221.0 (H) 11/12/2018   Lab Results  Component Value Date   CHOLHDL 5 11/12/2018   Lab Results  Component Value Date   HGBA1C 5.5 12/16/2020      Assessment & Plan:   Problem List Items Addressed This Visit       Other   Periorbital hyperpigmentation - Primary   Relevant Orders   Vitamin B12   Abnormal CBC   Relevant Orders   Vitamin B12    No orders of the defined types were placed in this encounter.   Follow-up: Return if symptoms worsen or fail to improve.  Explained that her issue is multifactorial including age, sleep disturbance, stress and hypothyroidism among others.  Explained that there are no definitive treatments.  We will check a B12 level today.   Alfred , MD 

## 2021-05-04 LAB — VITAMIN B12: Vitamin B-12: 300 pg/mL (ref 211–911)

## 2021-05-21 ENCOUNTER — Other Ambulatory Visit: Payer: Self-pay

## 2021-05-21 ENCOUNTER — Ambulatory Visit
Admission: RE | Admit: 2021-05-21 | Discharge: 2021-05-21 | Disposition: A | Discharge status: Home / Self Care | Payer: PPO | Source: Ambulatory Visit | Attending: Urology | Admitting: Urology

## 2021-05-21 DIAGNOSIS — R32 Unspecified urinary incontinence: Secondary | ICD-10-CM | POA: Diagnosis not present

## 2021-05-21 DIAGNOSIS — R3129 Other microscopic hematuria: Secondary | ICD-10-CM | POA: Diagnosis not present

## 2021-05-21 DIAGNOSIS — N852 Hypertrophy of uterus: Secondary | ICD-10-CM | POA: Diagnosis not present

## 2021-05-21 DIAGNOSIS — R310 Gross hematuria: Secondary | ICD-10-CM | POA: Diagnosis not present

## 2021-05-21 LAB — POCT I-STAT CREATININE: Creatinine, Ser: 0.7 mg/dL (ref 0.44–1.00)

## 2021-05-21 MED ORDER — IOHEXOL 350 MG/ML SOLN
100.0000 mL | Freq: Once | INTRAVENOUS | Status: AC | PRN
  Administered 2021-05-21: 100 mL via INTRAVENOUS

## 2021-05-24 ENCOUNTER — Ambulatory Visit: Payer: PPO | Admitting: Urology

## 2021-05-24 ENCOUNTER — Other Ambulatory Visit: Payer: Self-pay

## 2021-05-24 ENCOUNTER — Encounter: Payer: Self-pay | Admitting: Urology

## 2021-05-24 VITALS — BP 116/74 | HR 84 | Ht 64.0 in | Wt 211.0 lb

## 2021-05-24 DIAGNOSIS — N3946 Mixed incontinence: Secondary | ICD-10-CM

## 2021-05-24 LAB — MICROSCOPIC EXAMINATION: Bacteria, UA: NONE SEEN

## 2021-05-24 LAB — URINALYSIS, COMPLETE
Bilirubin, UA: NEGATIVE
Glucose, UA: NEGATIVE
Ketones, UA: NEGATIVE
Leukocytes,UA: NEGATIVE
Nitrite, UA: NEGATIVE
Protein,UA: NEGATIVE
Specific Gravity, UA: <1.005 — ABNORMAL LOW (ref 1.005–1.030)
Urobilinogen, Ur: 0.2 mg/dL (ref 0.2–1.0)
pH, UA: 6 (ref 5.0–7.5)

## 2021-05-24 MED ORDER — MIRABEGRON ER 50 MG PO TB24: 50.0000 mg | ORAL_TABLET | Freq: Every day | ORAL | 11 refills | Status: DC

## 2021-05-24 NOTE — Progress Notes (Signed)
05/24/2021 9:00 AM   [name] 11/21/1970 [id number]  Referring provider: Libby Maw, MD [place],  [place] [address]  No chief complaint on file.   HPI: [name]: Stress urge incontinence; gross hematuria with wiping; mesh bladder surgery 2008 with mesh erosion in the vagina.  Was recommended that she may need to perform clean intermittent catheterization in the future.  Normal kidneys on recent MRI of abdomen and no microscopic hematuria   Today Patient leaks with coughing sneezing but not bending lifting.  She sometimes has urge incontinence.  I do not think she has bedwetting unless she coughs.  Wears 3-4 pads a day sometimes damp sometimes soaked  She drinks a lot of water and voids every 45 minutes.  She cannot hold it for 2 hours.  She gets up 3 times at night.  She reports a good flow.  She does not feel empty  She describes vaginal mesh surgery and was involved with the lawsuit.  She has been told that there is mesh in the vagina but never had it removed.  She has no pain or bleeding with sexual activity  Intermittently she will see a small amount of blood on her pad.  She is not standing for a month.  She was told for incomplete bladder emptying years ago she may need to self catheterize.  She has had 6 back operations.  She is on oral hypoglycemics.  Mild grade 2 hypermobility the bladder neck with negative cough test on pelvic examination.  No mesh in the vagina anteriorly or posteriorly.  I has a good look inside the vagina but I cannot see the apex.  Tissues look very healthy.  No blood on tip of speculum  Patient has mixed incontinence.  She has had hematuria.  The MRI was on the abdomen.  CT scan ordered hematuria.  Call if abnormal.  Order urodynamics and come back for cystoscopy and proceed accordingly  Today Frequency stable.  Last culture positive and treated.  CT scan normal On urodynamics patient initially was catheterized  for 50 mL.  Maximum bladder capacity was 500 mL.  Bladder was stable.  At 200 mL her cough leak point pressure was 123 cm of water with mild leakage.  At 400 mL it was 54 cm of water with mild leakage.  Her Valsalva leak point pressure of 400 mL was 90 cm of water with mild leakage.  She could not void.  She had to strain to empty.  There was some subtraction artifact on the tracing.  She noted that she normally strains in order to empty.  She went to the restroom and emptied efficiently.  EMG activity increased with straining.  Bladder neck descended 2 to 3 cm.  Spinal hardware noted.  The details of the urodynamics are signed and dictated  Cystoscopy: Patient underwent flexible cystoscopy.  Bladder mucosa and trigone were normal.  No cystitis.  No sling in bladder or urethra.  At rest the patient has mild rotational descent to the urethra.  Again I cannot feel or see any mesh in the vagina or underneath the urethra.  She had a mild positive cough test after cystoscopy    PMH: Past Medical History:  Diagnosis Date   ADHD (attention deficit hyperactivity disorder)    Anemia    with pregnancy   Anxiety    Arthritis    Bipolar disorder (HCC)    Chronic back pain greater than 3 months duration  Depression    Diabetes mellitus without complication ([place])    Diabetic nephropathy ([place])    bilateral feet   Dysuria    has to have self in and out cath    Eroded bladder suspension mesh (HCC)    GERD (gastroesophageal reflux disease)    Hepatitis    C   History of COVID-19 11/2019   History of MRSA infection    after back surgery   History of sleep apnea    resolved after weight loss   Hypertension    Hypothyroidism    Leg pain, bilateral    Obesity    Obsessive-compulsive disorder    Pneumonia 12/24/2019   Rosacea    Sciatic nerve pain    Social anxiety disorder    Squamous cell carcinoma of back 2021    Surgical History: Past Surgical History:  Procedure Laterality Date   BACK  SURGERY     total of 6 back surgeries   bladder mesh  2009   DILITATION & CURRETTAGE/HYSTROSCOPY WITH NOVASURE ABLATION N/A 08/19/2016   Procedure: DILATATION & CURETTAGE WITH NOVASURE ABLATION;  Surgeon: [name], MD;  Location: [facility];  Service: Gynecology;  Laterality: N/A;   EUS N/A 09/09/2015   Procedure: UPPER ENDOSCOPIC ULTRASOUND (EUS) RADIAL;  Surgeon: [name], MD;  Location: [facility];  Service: Endoscopy;  Laterality: N/A;   EYE SURGERY     INCONTINENCE SURGERY     SPINAL CORD STIMULATOR INSERTION     SPINAL CORD STIMULATOR REMOVAL     SPINAL FUSION  2008   times two   TOTAL HIP ARTHROPLASTY Left 10/12/2020   Procedure: LEFT TOTAL HIP ARTHROPLASTY ANTERIOR APPROACH;  Surgeon: Leandrew Koyanagi, MD;  Location: [facility];  Service: Orthopedics;  Laterality: Left;   TUBAL LIGATION  2006    Home Medications:  Allergies as of 05/24/2021       Reactions   Pregabalin Anaphylaxis   Celebrex [celecoxib] Nausea And Vomiting   Stomach pain   Effexor [venlafaxine Hcl] Other (See Comments)   Serotonin toxicity   Mirapex [pramipexole]    Mirapex- Worsening "body jerks"   Pristiq [desvenlafaxine] Other (See Comments)   Serotonin toxicity    Prozac [fluoxetine Hcl] Swelling   Symbyax [olanzapine-fluoxetine Hcl] Swelling   Wellbutrin [bupropion Hcl] Swelling        Medication List        Accurate as of May 24, 2021  9:00 AM. If you have any questions, ask your nurse or doctor.          ALPRAZolam 0.5 MG tablet Commonly known as: XANAX Take 0.5 mg by mouth 2 (two) times daily as needed for anxiety.   ARIPiprazole 5 MG tablet Commonly known as: ABILIFY Take 1 tablet (5 mg total) by mouth daily.   atorvastatin 20 MG tablet Commonly known as: LIPITOR TAKE 1 TABLET(20 MG) BY MOUTH DAILY What changed:  how much to take how to take this when to take this   blood glucose meter kit and supplies Kit Dispense based on patient and insurance preference. Check  glucose before breakfast. ICD: E11.65   clomiPRAMINE 50 MG capsule Commonly known as: ANAFRANIL Take 100 mg by mouth at bedtime.   diclofenac Sodium 1 % Gel Commonly known as: VOLTAREN Apply topically.   Finacea 15 % Foam Generic drug: Azelaic Acid Apply 1 application topically 2 (two) times daily as needed (rosacea).   freestyle lancets Use as instructed. E11.65   gabapentin 300 MG capsule Commonly known  as: NEURONTIN TAKE 1 CAPSULE(300 MG) BY MOUTH THREE TIMES DAILY   glucose blood test strip Use as instructed, check glucose before breakfast. ICD: E11.65   lamoTRIgine 150 MG tablet Commonly known as: LAMICTAL Take 150 mg by mouth 2 (two) times daily.   levothyroxine 75 MCG tablet Commonly known as: SYNTHROID TAKE 1 TABLET(75 MCG) BY MOUTH DAILY BEFORE AND BREAKFAST   lisinopril 10 MG tablet Commonly known as: ZESTRIL TAKE 1 TABLET(10 MG) BY MOUTH DAILY   metFORMIN 500 MG tablet Commonly known as: GLUCOPHAGE TAKE 1 TABLET(500 MG) BY MOUTH TWICE DAILY WITH A MEAL   methylphenidate 20 MG tablet Commonly known as: RITALIN Take 20 mg by mouth 2 (two) times daily.   OVER THE COUNTER MEDICATION Take 2 tablets by mouth every 6 (six) hours as needed (headache). Headache Relief   pantoprazole 40 MG tablet Commonly known as: PROTONIX Take 1 tablet by mouth daily.   Semaglutide(0.25 or 0.5MG /DOS) 2 MG/1.5ML Sopn Inject 0.25 mg into the skin every Thursday.        Allergies:  Allergies  Allergen Reactions   Pregabalin Anaphylaxis   Celebrex [Celecoxib] Nausea And Vomiting    Stomach pain   Effexor [Venlafaxine Hcl] Other (See Comments)    Serotonin toxicity   Mirapex [Pramipexole]     Mirapex- Worsening "body jerks"   Pristiq [Desvenlafaxine] Other (See Comments)    Serotonin toxicity    Prozac [Fluoxetine Hcl] Swelling   Symbyax [Olanzapine-Fluoxetine Hcl] Swelling   Wellbutrin [Bupropion Hcl] Swelling    Family History: Family History  Problem  Relation Age of Onset   Schizophrenia Father    Schizophrenia Cousin    Hyperthyroidism Mother    Hypothyroidism Maternal Grandmother     Social History:  reports that she has been smoking cigarettes. She has a 15.00 pack-year smoking history. She has never used smokeless tobacco. She reports that she does not drink alcohol and does not use drugs.  ROS:                                        Physical Exam: LMP 05/21/2021 (Exact Date)   Constitutional:  Alert and oriented, No acute distress. HEENT:  AT, moist mucus membranes.  Trachea midline, no masses.   Laboratory Data: Lab Results  Component Value Date   WBC 10.6 (H) 12/16/2020   HGB 13.4 12/16/2020   HCT 40.8 12/16/2020   MCV 78.2 12/16/2020   PLT 317.0 12/16/2020    Lab Results  Component Value Date   CREATININE 0.70 05/21/2021    No results found for: PSA  No results found for: TESTOSTERONE  Lab Results  Component Value Date   HGBA1C 5.5 12/16/2020    Urinalysis    Component Value Date/Time   COLORURINE YELLOW 12/16/2020 1137   APPEARANCEUR Hazy (A) 04/12/2021 1004   LABSPEC <=1.005 (A) 12/16/2020 1137   PHURINE 5.0 12/16/2020 1137   GLUCOSEU Negative 04/12/2021 1004   GLUCOSEU NEGATIVE 12/16/2020 1137   HGBUR SMALL (A) 12/16/2020 1137   BILIRUBINUR Negative 04/12/2021 1004   KETONESUR NEGATIVE 12/16/2020 1137   PROTEINUR Negative 04/12/2021 1004   PROTEINUR NEGATIVE 10/12/2020 0546   UROBILINOGEN 0.2 12/16/2020 1137   NITRITE Negative 04/12/2021 1004   NITRITE NEGATIVE 12/16/2020 1137   LEUKOCYTESUR 1+ (A) 04/12/2021 1004   LEUKOCYTESUR NEGATIVE 12/16/2020 1137    Pertinent Imaging:   Assessment & Plan:  Patient has  a poorly contractile bladder.  Based upon presentation and urodynamic findings and straining to urinate she should not have a sling for her mild stress incontinence.  Retention short or long-term is a distant complication she had a bulking agent.  The role  of medical and physical therapy discussed in detail.  She could have a neurogenic bladder.  Reassess in 6 weeks on Myrbetriq 50 mg samples and prescription.  The patient asked me about bulking agents and we will discuss these eventually referred do not help her with medication.  She want to hold off on physical therapy because of potential co-pay  There are no diagnoses linked to this encounter.  No follow-ups on file.  Reece Packer, MD  [facility] [address], [place], [place] [address] [phone]

## 2021-06-03 DIAGNOSIS — F909 Attention-deficit hyperactivity disorder, unspecified type: Secondary | ICD-10-CM | POA: Diagnosis not present

## 2021-06-03 DIAGNOSIS — F429 Obsessive-compulsive disorder, unspecified: Secondary | ICD-10-CM | POA: Diagnosis not present

## 2021-06-03 DIAGNOSIS — F411 Generalized anxiety disorder: Secondary | ICD-10-CM | POA: Diagnosis not present

## 2021-06-03 DIAGNOSIS — F3132 Bipolar disorder, current episode depressed, moderate: Secondary | ICD-10-CM | POA: Diagnosis not present

## 2021-06-08 ENCOUNTER — Other Ambulatory Visit: Payer: Self-pay | Admitting: Family Medicine

## 2021-06-08 DIAGNOSIS — E039 Hypothyroidism, unspecified: Secondary | ICD-10-CM

## 2021-06-17 ENCOUNTER — Ambulatory Visit: Payer: PPO | Admitting: Family Medicine

## 2021-06-21 ENCOUNTER — Ambulatory Visit (INDEPENDENT_AMBULATORY_CARE_PROVIDER_SITE_OTHER): Payer: PPO | Admitting: Family Medicine

## 2021-06-21 ENCOUNTER — Other Ambulatory Visit: Payer: Self-pay

## 2021-06-21 ENCOUNTER — Telehealth: Payer: Self-pay | Admitting: Urology

## 2021-06-21 ENCOUNTER — Encounter: Payer: Self-pay | Admitting: Family Medicine

## 2021-06-21 VITALS — BP 124/68 | HR 92 | Temp 97.1°F | Ht 64.0 in | Wt 204.4 lb

## 2021-06-21 DIAGNOSIS — E039 Hypothyroidism, unspecified: Secondary | ICD-10-CM | POA: Diagnosis not present

## 2021-06-21 DIAGNOSIS — Z23 Encounter for immunization: Secondary | ICD-10-CM | POA: Diagnosis not present

## 2021-06-21 DIAGNOSIS — E538 Deficiency of other specified B group vitamins: Secondary | ICD-10-CM

## 2021-06-21 DIAGNOSIS — F439 Reaction to severe stress, unspecified: Secondary | ICD-10-CM | POA: Diagnosis not present

## 2021-06-21 DIAGNOSIS — L818 Other specified disorders of pigmentation: Secondary | ICD-10-CM

## 2021-06-21 DIAGNOSIS — E1165 Type 2 diabetes mellitus with hyperglycemia: Secondary | ICD-10-CM | POA: Diagnosis not present

## 2021-06-21 LAB — BASIC METABOLIC PANEL
BUN: 10 mg/dL (ref 6–23)
CO2: 29 mEq/L (ref 19–32)
Calcium: 9.2 mg/dL (ref 8.4–10.5)
Chloride: 102 mEq/L (ref 96–112)
Creatinine, Ser: 0.77 mg/dL (ref 0.40–1.20)
GFR: 91.16 mL/min (ref >60.00)
Glucose, Bld: 91 mg/dL (ref 70–99)
Potassium: 4 mEq/L (ref 3.5–5.1)
Sodium: 137 mEq/L (ref 135–145)

## 2021-06-21 LAB — CBC
HCT: 38.8 % (ref 36.0–46.0)
Hemoglobin: 13.1 g/dL (ref 12.0–15.0)
MCHC: 33.7 g/dL (ref 30.0–36.0)
MCV: 81.6 fl (ref 78.0–100.0)
Platelets: 274 10*3/uL (ref 150.0–400.0)
RBC: 4.75 Mil/uL (ref 3.87–5.11)
RDW: 17.3 % — ABNORMAL HIGH (ref 11.5–15.5)
WBC: 10.8 10*3/uL — ABNORMAL HIGH (ref 4.0–10.5)

## 2021-06-21 LAB — TSH: TSH: 1.09 u[IU]/mL (ref 0.35–5.50)

## 2021-06-21 LAB — VITAMIN B12: Vitamin B-12: 390 pg/mL (ref 211–911)

## 2021-06-21 LAB — HEMOGLOBIN A1C: Hgb A1c MFr Bld: 5.4 % (ref 4.6–6.5)

## 2021-06-21 MED ORDER — OXYBUTYNIN CHLORIDE ER 10 MG PO TB24: 10.0000 mg | ORAL_TABLET | Freq: Every day | ORAL | 11 refills | Status: DC

## 2021-06-21 MED ORDER — SEMAGLUTIDE(0.25 OR 0.5MG/DOS) 2 MG/1.5ML ~~LOC~~ SOPN: 0.5000 mg | PEN_INJECTOR | SUBCUTANEOUS | 3 refills | Status: DC

## 2021-06-21 NOTE — Telephone Encounter (Signed)
Pt was given samples of Myrbetriq 50 mg tabs and said they are NOT working.  She moved her appt up from 11/7 to 10/31, the soonest [facility] had.  She would like to know if it's possible for her to try a different sample of something else prior to her appt.

## 2021-06-21 NOTE — Progress Notes (Signed)
Established Patient Office Visit  Subjective:  Patient ID: [name], female    DOB: 07/12/1971  Age: 50 y.o. MRN: [id number]  CC:  Chief Complaint  Patient presents with   Follow-up    6 month follow up on DM, would like refill ozempic due to not being able to see nutritionist right now. Concerns about continued dark circles around eyes.     HPI [name] presents for follow-up of diabetes, hypothyroidism B12 deficiency.  Has been taking a multivitamin with B12 in it.  Diabetes has been controlled with Glucophage and semaglutide.  She is tolerating methotrexate well.  Continues with levothyroxine 0.7 daily.  She has been stressed and not sleeping well.  Her son and his significant other who lives with her are expecting a baby in January.  Proxacol's persist under her eyes.  Past Medical History:  Diagnosis Date   ADHD (attention deficit hyperactivity disorder)    Anemia    with pregnancy   Anxiety    Arthritis    Bipolar disorder (HCC)    Chronic back pain greater than 3 months duration    Depression    Diabetes mellitus without complication (HCC)    Diabetic nephropathy ([place])    bilateral feet   Dysuria    has to have self in and out cath    Eroded bladder suspension mesh (HCC)    GERD (gastroesophageal reflux disease)    Hepatitis    C   History of COVID-19 11/2019   History of MRSA infection    after back surgery   History of sleep apnea    resolved after weight loss   Hypertension    Hypothyroidism    Leg pain, bilateral    Obesity    Obsessive-compulsive disorder    Pneumonia 12/24/2019   Rosacea    Sciatic nerve pain    Social anxiety disorder    Squamous cell carcinoma of back 2021    Past Surgical History:  Procedure Laterality Date   BACK SURGERY     total of 6 back surgeries   bladder mesh  2009   DILITATION & CURRETTAGE/HYSTROSCOPY WITH NOVASURE ABLATION N/A 08/19/2016   Procedure: DILATATION & CURETTAGE WITH NOVASURE ABLATION;   Surgeon: [name], MD;  Location: [facility];  Service: Gynecology;  Laterality: N/A;   EUS N/A 09/09/2015   Procedure: UPPER ENDOSCOPIC ULTRASOUND (EUS) RADIAL;  Surgeon: [name], MD;  Location: [facility];  Service: Endoscopy;  Laterality: N/A;   EYE SURGERY     INCONTINENCE SURGERY     SPINAL CORD STIMULATOR INSERTION     SPINAL CORD STIMULATOR REMOVAL     SPINAL FUSION  2008   times two   TOTAL HIP ARTHROPLASTY Left 10/12/2020   Procedure: LEFT TOTAL HIP ARTHROPLASTY ANTERIOR APPROACH;  Surgeon: Leandrew Koyanagi, MD;  Location: [facility];  Service: Orthopedics;  Laterality: Left;   TUBAL LIGATION  2006    Family History  Problem Relation Age of Onset   Schizophrenia Father    Schizophrenia Cousin    Hyperthyroidism Mother    Hypothyroidism Maternal Grandmother     Social History   Socioeconomic History   Marital status: Legally Separated    Spouse name: Not on file   Number of children: Not on file   Years of education: Not on file   Highest education level: Not on file  Occupational History   Not on file  Tobacco Use   Smoking status: Light Smoker  Packs/day: 1.00    Years: 15.00    Pack years: 15.00    Types: Cigarettes    Last attempt to quit: 02/09/2016    Years since quitting: 5.3   Smokeless tobacco: Never   Tobacco comments:    use vapor, social  Vaping Use   Vaping Use: Never used  Substance and Sexual Activity   Alcohol use: No   Drug use: No   Sexual activity: Yes    Birth control/protection: Surgical  Other Topics Concern   Not on file  Social History Narrative   Not on file   Social Determinants of Health   Financial Resource Strain: Medium Risk   Difficulty of Paying Living Expenses: Somewhat hard  Food Insecurity: Food Insecurity Present   Worried About Running Out of Food in the Last Year: Sometimes true   Ran Out of Food in the Last Year: Never true  Transportation Needs: Unmet Transportation Needs   Lack of Transportation (Medical):  Yes   Lack of Transportation (Non-Medical): No  Physical Activity: Insufficiently Active   Days of Exercise per Week: 3 days   Minutes of Exercise per Session: 20 min  Stress: No Stress Concern Present   Feeling of Stress : Only a little  Social Connections: Socially Isolated   Frequency of Communication with Friends and Family: More than three times a week   Frequency of Social Gatherings with Friends and Family: More than three times a week   Attends Religious Services: Never   Marine scientist or Organizations: No   Attends Music therapist: Never   Marital Status: Separated  Intimate Partner Violence: Not At Risk   Fear of Current or Ex-Partner: No   Emotionally Abused: No   Physically Abused: No   Sexually Abused: No    Outpatient Medications Prior to Visit  Medication Sig Dispense Refill   ALPRAZolam (XANAX) 0.5 MG tablet Take 0.5 mg by mouth 2 (two) times daily as needed for anxiety.     ARIPiprazole (ABILIFY) 5 MG tablet Take 1 tablet (5 mg total) by mouth daily. 30 tablet 2   atorvastatin (LIPITOR) 20 MG tablet TAKE 1 TABLET(20 MG) BY MOUTH DAILY (Patient taking differently: Take 20 mg by mouth daily. TAKE 1 TABLET(20 MG) BY MOUTH DAILY) 100 tablet 3   blood glucose meter kit and supplies KIT Dispense based on patient and insurance preference. Check glucose before breakfast. ICD: E11.65 1 each 0   clomiPRAMINE (ANAFRANIL) 50 MG capsule Take 100 mg by mouth at bedtime.     diclofenac Sodium (VOLTAREN) 1 % GEL Apply topically.     FINACEA 15 % FOAM Apply 1 application topically 2 (two) times daily as needed (rosacea).     gabapentin (NEURONTIN) 300 MG capsule TAKE 1 CAPSULE(300 MG) BY MOUTH THREE TIMES DAILY 90 capsule 2   glucose blood test strip Use as instructed, check glucose before breakfast. ICD: E11.65 100 each 12   lamoTRIgine (LAMICTAL) 150 MG tablet Take 150 mg by mouth 2 (two) times daily.     Lancets (FREESTYLE) lancets Use as instructed. E11.65  100 each 12   levothyroxine (SYNTHROID) 75 MCG tablet TAKE 1 TABLET(75 MCG) BY MOUTH DAILY BEFORE AND BREAKFAST 90 tablet 3   lisinopril (ZESTRIL) 10 MG tablet TAKE 1 TABLET(10 MG) BY MOUTH DAILY 90 tablet 3   metFORMIN (GLUCOPHAGE) 500 MG tablet TAKE 1 TABLET(500 MG) BY MOUTH TWICE DAILY WITH A MEAL 180 tablet 0   methylphenidate (RITALIN) 20 MG tablet  Take 20 mg by mouth 2 (two) times daily.     OVER THE COUNTER MEDICATION Take 2 tablets by mouth every 6 (six) hours as needed (headache). Headache Relief     pantoprazole (PROTONIX) 40 MG tablet Take 1 tablet by mouth daily.     Semaglutide,0.25 or 0.5MG/DOS, 2 MG/1.5ML SOPN Inject 0.25 mg into the skin every Thursday.     mirabegron ER (MYRBETRIQ) 50 MG TB24 tablet Take 1 tablet (50 mg total) by mouth daily. (Patient not taking: Reported on 06/21/2021) 30 tablet 11   No facility-administered medications prior to visit.    Allergies  Allergen Reactions   Pregabalin Anaphylaxis   Celebrex [Celecoxib] Nausea And Vomiting    Stomach pain   Effexor [Venlafaxine Hcl] Other (See Comments)    Serotonin toxicity   Mirapex [Pramipexole]     Mirapex- Worsening "body jerks"   Pristiq [Desvenlafaxine] Other (See Comments)    Serotonin toxicity    Prozac [Fluoxetine Hcl] Swelling   Symbyax [Olanzapine-Fluoxetine Hcl] Swelling   Wellbutrin [Bupropion Hcl] Swelling    ROS Review of Systems  Constitutional: Negative.   HENT: Negative.    Respiratory: Negative.    Cardiovascular: Negative.   Gastrointestinal: Negative.   Endocrine: Negative for polyphagia and polyuria.  Genitourinary: Negative.   Neurological:  Negative for speech difficulty and weakness.     Objective:    Physical Exam Vitals and nursing note reviewed.  Constitutional:      General: She is not in acute distress.    Appearance: Normal appearance. She is not ill-appearing, toxic-appearing or diaphoretic.  HENT:     Head: Normocephalic and atraumatic.     Right Ear:  External ear normal.     Left Ear: External ear normal.     Mouth/Throat:     Mouth: Mucous membranes are dry.     Pharynx: Oropharynx is clear. No oropharyngeal exudate or posterior oropharyngeal erythema.  Eyes:     Extraocular Movements: Extraocular movements intact.     Pupils: Pupils are equal, round, and reactive to light.  Cardiovascular:     Rate and Rhythm: Normal rate and regular rhythm.  Pulmonary:     Effort: Pulmonary effort is normal.     Breath sounds: Normal breath sounds.  Musculoskeletal:     Cervical back: No rigidity or tenderness.  Lymphadenopathy:     Cervical: No cervical adenopathy.  Skin:    General: Skin is warm and dry.  Neurological:     Mental Status: She is alert and oriented to person, place, and time.  Psychiatric:        Mood and Affect: Mood normal.        Behavior: Behavior normal.    BP 124/68 (BP Location: Right Arm, Patient Position: Sitting, Cuff Size: Normal)   Pulse 92   Temp (!) 97.1 F (36.2 C) (Temporal)   Ht 5' 4"  (1.626 m)   Wt 204 lb 6.4 oz (92.7 kg)   SpO2 99%   BMI 35.09 kg/m  Wt Readings from Last 3 Encounters:  06/21/21 204 lb 6.4 oz (92.7 kg)  05/24/21 211 lb (95.7 kg)  05/03/21 211 lb (95.7 kg)     Health Maintenance Due  Topic Date Due   HIV Screening  Never done   COLONOSCOPY (Pts 45-12yr Insurance coverage will need to be confirmed)  Never done   HEMOGLOBIN A1C  06/17/2021    There are no preventive care reminders to display for this patient.  Lab Results  Component  Value Date   TSH 1.78 12/16/2020   Lab Results  Component Value Date   WBC 10.6 (H) 12/16/2020   HGB 13.4 12/16/2020   HCT 40.8 12/16/2020   MCV 78.2 12/16/2020   PLT 317.0 12/16/2020   Lab Results  Component Value Date   NA 136 12/16/2020   K 4.5 12/16/2020   CO2 30 12/16/2020   GLUCOSE 89 12/16/2020   BUN 17 12/16/2020   CREATININE 0.70 05/21/2021   BILITOT 0.3 12/16/2020   ALKPHOS 87 12/16/2020   AST 11 12/16/2020   ALT 17  12/16/2020   PROT 6.6 12/16/2020   ALBUMIN 3.9 12/16/2020   CALCIUM 9.8 12/16/2020   ANIONGAP 8 10/13/2020   GFR 102.93 12/16/2020   Lab Results  Component Value Date   CHOL 166 11/12/2018   Lab Results  Component Value Date   HDL 35.80 (L) 11/12/2018   Lab Results  Component Value Date   LDLCALC 174 (H) 04/15/2015   Lab Results  Component Value Date   TRIG 221.0 (H) 11/12/2018   Lab Results  Component Value Date   CHOLHDL 5 11/12/2018   Lab Results  Component Value Date   HGBA1C 5.5 12/16/2020      Assessment & Plan:   Problem List Items Addressed This Visit       Endocrine   Adult hypothyroidism   Relevant Orders   Basic metabolic panel   TSH   Type 2 diabetes mellitus with hyperglycemia, without long-term current use of insulin (HCC)   Relevant Medications   Semaglutide,0.25 or 0.5MG/DOS, 2 MG/1.5ML SOPN (Start on 06/24/2021)   Other Relevant Orders   CBC   Hemoglobin A1c     Other   B12 deficiency   Relevant Orders   Vitamin B12   Periorbital hyperpigmentation - Primary   Relevant Orders   Vitamin B12   Stress   Need for influenza vaccination   Relevant Orders   Flu Vaccine QUAD 6+ mos PF IM (Fluarix Quad PF) (Completed)    Meds ordered this encounter  Medications   Semaglutide,0.25 or 0.5MG/DOS, 2 MG/1.5ML SOPN    Sig: Inject 0.5 mg into the skin every Thursday.    Dispense:  4.5 mL    Refill:  3    Follow-up: Return in about 6 months (around 12/20/2021).   Believe that her.  Orbital hyperpigmentation is multifactorial.  May consider dermatology referral.  Continue all medicines as above.  Libby Maw, MD

## 2021-06-21 NOTE — Telephone Encounter (Signed)
Pt aware. Sent medication to pharmacy

## 2021-07-05 ENCOUNTER — Other Ambulatory Visit: Payer: Self-pay

## 2021-07-05 ENCOUNTER — Ambulatory Visit: Payer: PPO | Admitting: Urology

## 2021-07-05 ENCOUNTER — Encounter: Payer: Self-pay | Admitting: Urology

## 2021-07-05 ENCOUNTER — Other Ambulatory Visit: Payer: Self-pay | Admitting: Family Medicine

## 2021-07-05 VITALS — BP 146/78 | HR 101 | Ht 64.0 in | Wt 202.0 lb

## 2021-07-05 DIAGNOSIS — E1165 Type 2 diabetes mellitus with hyperglycemia: Secondary | ICD-10-CM

## 2021-07-05 DIAGNOSIS — N3946 Mixed incontinence: Secondary | ICD-10-CM | POA: Diagnosis not present

## 2021-07-05 MED ORDER — CIPROFLOXACIN HCL 250 MG PO TABS: 250.0000 mg | ORAL_TABLET | Freq: Two times a day (BID) | ORAL | 0 refills | Status: DC

## 2021-07-05 NOTE — Progress Notes (Addendum)
07/05/2021 10:32 AM   [name] [birth year] [id number]  Referring provider: Libby Maw, [place],  [place] [address]  Chief Complaint  Patient presents with   Urinary Incontinence    6wk follow up    HPI: [name]: Stress urge incontinence; gross hematuria with wiping; mesh bladder surgery 2008 with mesh erosion in the vagina.  Was recommended that she may need to perform clean intermittent catheterization in the future.  Normal kidneys on recent MRI of abdomen and no microscopic hematuria   Today Patient leaks with coughing sneezing but not bending lifting.  She sometimes has urge incontinence.  I do not think she has bedwetting unless she coughs.  Wears 3-4 pads a day sometimes damp sometimes soaked  She drinks a lot of water and voids every 45 minutes.  She cannot hold it for 2 hours.  She gets up 3 times at night.  She reports a good flow.  She does not feel empty  She describes vaginal mesh surgery and was involved with the lawsuit.  She has been told that there is mesh in the vagina but never had it removed.  She has no pain or bleeding with sexual activity  Intermittently she will see a small amount of blood on her pad.  She is not standing for a month.  She was told for incomplete bladder emptying years ago she may need to self catheterize.  She has had 6 back operations.  She is on oral hypoglycemics.  Mild grade 2 hypermobility the bladder neck with negative cough test on pelvic examination.  No mesh in the vagina anteriorly or posteriorly.  I has a good look inside the vagina but I cannot see the apex.  Tissues look very healthy.  No blood on tip of speculum   Patient has mixed incontinence.  She has had hematuria.     Last culture positive and treated.  CT scan normal On urodynamics patient initially was catheterized for 50 mL.  Maximum bladder capacity was 500 mL.  Bladder was stable.  At 200 mL her cough leak point pressure was  123 cm of water with mild leakage.  At 400 mL it was 54 cm of water with mild leakage.  Her Valsalva leak point pressure of 400 mL was 90 cm of water with mild leakage.  She could not void.  She had to strain to empty.  There was some subtraction artifact on the tracing.  She noted that she normally strains in order to empty.  She went to the restroom and emptied efficiently.  EMG activity increased with straining.  Bladder neck descended 2 to 3 cm.  Spinal hardware noted.  Cystoscopy: normal  At rest the patient has mild rotational descent to the urethra.  Again I cannot feel or see any mesh in the vagina or underneath the urethra.  She had a mild positive cough test after cystoscopy  Patient has a poorly contractile bladder.  Based upon presentation and urodynamic findings and straining to urinate she should not have a sling for her mild stress incontinence.  Retention short or long-term is a distant complication she had a bulking agent.  The role of medical and physical therapy discussed in detail.  She could have a neurogenic bladder.  Reassess in 6 weeks on Myrbetriq 50 mg samples and prescription.  The patient asked me about bulking agents and we will discuss these eventually referred do not help her with medication.  She want  to hold off on physical therapy because of potential co-pay  Today Incontinence stable.  Frequency stable.  Patient tried Myrbetriq did not work.  She was given oxybutynin.  She has been on oxybutynin for 10 days  The patient I spoke about the pathophysiology of her incontinence.  She still has urgency incontinence.  She sometimes has foot on the floor syndrome.  She had high-volume bedwetting last night but again thinks is due to coughing and from her smoking.  She cannot afford physical therapy  Clinically not infected  She understands that she has a combination of bladder overactivity and mild urethral insufficiency.  She understands that I would not be recommending a  sling based upon her above history and risk of retention and other factors.  I made it clear that usually tried 3 medications and she did not wish to take oxybutynin longer or try a third  I went over a bulking agent in full detail.  We talked about success rate for stress incontinence and that everybody is not completely dry.  We talked about success rates with bladder overactivity symptoms.  We talked about retreatment rates.  We talked about rare risk of short or long-term retention especially in someone with a poorly contractile bladder as she has.  We talked about pain and erosion risks.  We talked about the risk of sling erosion and significant surgical sequelae and I would try to stay away from the area of the sling during the treatment.  She understands that some patients are quite uncomfortable and even the procedure sometimes has to be stopped.  She says she has good pain tolerance.  I gave her 3 days of ciprofloxacin to start 1 day prior to the procedure.  I have noted that the patient today and based upon chart review moving up her appointments that she tends to want to have her incontinence fixed although it is a chronic problem.  I asked her nicely a few times to listen to my explanations as opposed to interrupting.  I wanted to make sure that she fully understands that she has complex incontinence and to make a good decision regarding bulking agent therapy.  I am a little bit concerned about reaching her treatment goal but hopefully the bulking agent therapy will be successful.          PMH: Past Medical History:  Diagnosis Date   ADHD (attention deficit hyperactivity disorder)    Anemia    with pregnancy   Anxiety    Arthritis    Bipolar disorder (HCC)    Chronic back pain greater than 3 months duration    Depression    Diabetes mellitus without complication (HCC)    Diabetic nephropathy ([place])    bilateral feet   Dysuria    has to have self in and out cath    Eroded bladder  suspension mesh (HCC)    GERD (gastroesophageal reflux disease)    Hepatitis    C   History of COVID-19 11/2019   History of MRSA infection    after back surgery   History of sleep apnea    resolved after weight loss   Hypertension    Hypothyroidism    Leg pain, bilateral    Obesity    Obsessive-compulsive disorder    Pneumonia 12/24/2019   Rosacea    Sciatic nerve pain    Social anxiety disorder    Squamous cell carcinoma of back 2021    Surgical History: Past Surgical History:  Procedure Laterality Date   BACK SURGERY     total of 6 back surgeries   bladder mesh  2009   DILITATION & CURRETTAGE/HYSTROSCOPY WITH NOVASURE ABLATION N/A 08/19/2016   Procedure: DILATATION & CURETTAGE WITH NOVASURE ABLATION;  Surgeon: [name], MD;  Location: [facility];  Service: Gynecology;  Laterality: N/A;   EUS N/A 09/09/2015   Procedure: UPPER ENDOSCOPIC ULTRASOUND (EUS) RADIAL;  Surgeon: [name], MD;  Location: [facility];  Service: Endoscopy;  Laterality: N/A;   EYE SURGERY     INCONTINENCE SURGERY     SPINAL CORD STIMULATOR INSERTION     SPINAL CORD STIMULATOR REMOVAL     SPINAL FUSION  2008   times two   TOTAL HIP ARTHROPLASTY Left 10/12/2020   Procedure: LEFT TOTAL HIP ARTHROPLASTY ANTERIOR APPROACH;  Surgeon: Leandrew Koyanagi, MD;  Location: [facility];  Service: Orthopedics;  Laterality: Left;   TUBAL LIGATION  2006    Home Medications:  Allergies as of 07/05/2021       Reactions   Pregabalin Anaphylaxis   Celebrex [celecoxib] Nausea And Vomiting   Stomach pain   Effexor [venlafaxine Hcl] Other (See Comments)   Serotonin toxicity   Mirapex [pramipexole]    Mirapex- Worsening "body jerks"   Pristiq [desvenlafaxine] Other (See Comments)   Serotonin toxicity    Prozac [fluoxetine Hcl] Swelling   Symbyax [olanzapine-fluoxetine Hcl] Swelling   Wellbutrin [bupropion Hcl] Swelling        Medication List        Accurate as of July 05, 2021 10:32 AM. If you have any  questions, ask your nurse or doctor.          ALPRAZolam 0.5 MG tablet Commonly known as: XANAX Take 0.5 mg by mouth 2 (two) times daily as needed for anxiety.   ARIPiprazole 5 MG tablet Commonly known as: ABILIFY Take 1 tablet (5 mg total) by mouth daily.   atorvastatin 20 MG tablet Commonly known as: LIPITOR TAKE 1 TABLET(20 MG) BY MOUTH DAILY What changed:  how much to take how to take this when to take this   blood glucose meter kit and supplies Kit Dispense based on patient and insurance preference. Check glucose before breakfast. ICD: E11.65   clomiPRAMINE 75 MG capsule Commonly known as: ANAFRANIL Take by mouth. What changed: Another medication with the same name was removed. Continue taking this medication, and follow the directions you see here. Changed by: Reece Packer, MD   diclofenac Sodium 1 % Gel Commonly known as: VOLTAREN Apply topically.   Finacea 15 % Foam Generic drug: Azelaic Acid Apply 1 application topically 2 (two) times daily as needed (rosacea).   freestyle lancets Use as instructed. E11.65   gabapentin 300 MG capsule Commonly known as: NEURONTIN TAKE 1 CAPSULE(300 MG) BY MOUTH THREE TIMES DAILY   glucose blood test strip Use as instructed, check glucose before breakfast. ICD: E11.65   lamoTRIgine 150 MG tablet Commonly known as: LAMICTAL Take 150 mg by mouth 2 (two) times daily.   levothyroxine 75 MCG tablet Commonly known as: SYNTHROID TAKE 1 TABLET(75 MCG) BY MOUTH DAILY BEFORE AND BREAKFAST   lisinopril 10 MG tablet Commonly known as: ZESTRIL TAKE 1 TABLET(10 MG) BY MOUTH DAILY   metFORMIN 500 MG tablet Commonly known as: GLUCOPHAGE TAKE 1 TABLET(500 MG) BY MOUTH TWICE DAILY WITH A MEAL   methylphenidate 20 MG tablet Commonly known as: RITALIN Take 20 mg by mouth 2 (two) times daily.   OVER THE COUNTER  MEDICATION Take 2 tablets by mouth every 6 (six) hours as needed (headache). Headache Relief   oxybutynin 10  MG 24 hr tablet Commonly known as: DITROPAN-XL Take 1 tablet (10 mg total) by mouth daily.   pantoprazole 40 MG tablet Commonly known as: PROTONIX Take 1 tablet by mouth daily.   Semaglutide(0.25 or 0.5MG/DOS) 2 MG/1.5ML Sopn Inject 0.5 mg into the skin every Thursday.        Allergies:  Allergies  Allergen Reactions   Pregabalin Anaphylaxis   Celebrex [Celecoxib] Nausea And Vomiting    Stomach pain   Effexor [Venlafaxine Hcl] Other (See Comments)    Serotonin toxicity   Mirapex [Pramipexole]     Mirapex- Worsening "body jerks"   Pristiq [Desvenlafaxine] Other (See Comments)    Serotonin toxicity    Prozac [Fluoxetine Hcl] Swelling   Symbyax [Olanzapine-Fluoxetine Hcl] Swelling   Wellbutrin [Bupropion Hcl] Swelling    Family History: Family History  Problem Relation Age of Onset   Schizophrenia Father    Schizophrenia Cousin    Hyperthyroidism Mother    Hypothyroidism Maternal Grandmother     Social History:  reports that she has been smoking cigarettes. She has a 15.00 pack-year smoking history. She has never used smokeless tobacco. She reports that she does not drink alcohol and does not use drugs.  ROS:                                        Physical Exam: BP (!) 146/78   Pulse (!) 101   Ht _0  (1.626 m)   Wt 91.6 kg   BMI 34.67 kg/m   Constitutional:  Alert and oriented, No acute distress.  Laboratory Data: Lab Results  Component Value Date   WBC 10.8 (H) 06/21/2021   HGB 13.1 06/21/2021   HCT 38.8 06/21/2021   MCV 81.6 06/21/2021   PLT 274.0 06/21/2021    Lab Results  Component Value Date   CREATININE 0.77 06/21/2021    No results found for: PSA  No results found for: TESTOSTERONE  Lab Results  Component Value Date   HGBA1C 5.4 06/21/2021    Urinalysis    Component Value Date/Time   COLORURINE YELLOW 12/16/2020 1137   APPEARANCEUR Clear 05/24/2021 0943   LABSPEC <=1.005 (A) 12/16/2020 1137   PHURINE  5.0 12/16/2020 1137   GLUCOSEU Negative 05/24/2021 0943   GLUCOSEU NEGATIVE 12/16/2020 1137   HGBUR SMALL (A) 12/16/2020 1137   BILIRUBINUR Negative 05/24/2021 0943   KETONESUR NEGATIVE 12/16/2020 1137   PROTEINUR Negative 05/24/2021 0943   PROTEINUR NEGATIVE 10/12/2020 0546   UROBILINOGEN 0.2 12/16/2020 1137   NITRITE Negative 05/24/2021 0943   NITRITE NEGATIVE 12/16/2020 1137   LEUKOCYTESUR Negative 05/24/2021 0943   LEUKOCYTESUR NEGATIVE 12/16/2020 1137    Pertinent Imaging:   Assessment & Plan: We will call patient for a bulking agent therapy.  Again I was very clear on expectations regarding therapy and risks.  She does have cough incontinence and is a smoker.  Third line neuromodulation treatments might be a future option to consider.  In my opinion she does have elements of an overactive bladder and mixed picture.  She may or may not have a neurogenic bladder.  The patient has always been very polite and I believe understands well my explanations.  I think she is made a good decision to try the bulking agent  There are no  diagnoses linked to this encounter.  No follow-ups on file.  Reece Packer, MD  [facility] [address] [place], [place] [address] [phone]

## 2021-07-05 NOTE — Patient Instructions (Signed)
[facility] Address: [place], [place], [place] [address] Hours:  Open ? Closes 5PM Phone: [phone]

## 2021-07-12 ENCOUNTER — Ambulatory Visit: Payer: PPO | Admitting: Urology

## 2021-07-13 DIAGNOSIS — E119 Type 2 diabetes mellitus without complications: Secondary | ICD-10-CM | POA: Diagnosis not present

## 2021-07-13 DIAGNOSIS — H5213 Myopia, bilateral: Secondary | ICD-10-CM | POA: Diagnosis not present

## 2021-07-13 DIAGNOSIS — H524 Presbyopia: Secondary | ICD-10-CM | POA: Diagnosis not present

## 2021-07-13 LAB — HM DIABETES EYE EXAM

## 2021-07-21 DIAGNOSIS — F3132 Bipolar disorder, current episode depressed, moderate: Secondary | ICD-10-CM | POA: Diagnosis not present

## 2021-07-21 DIAGNOSIS — F909 Attention-deficit hyperactivity disorder, unspecified type: Secondary | ICD-10-CM | POA: Diagnosis not present

## 2021-07-21 DIAGNOSIS — F411 Generalized anxiety disorder: Secondary | ICD-10-CM | POA: Diagnosis not present

## 2021-07-21 DIAGNOSIS — F429 Obsessive-compulsive disorder, unspecified: Secondary | ICD-10-CM | POA: Diagnosis not present

## 2021-07-23 ENCOUNTER — Encounter: Payer: Self-pay | Admitting: Family Medicine

## 2021-07-23 ENCOUNTER — Other Ambulatory Visit: Payer: Self-pay

## 2021-07-27 ENCOUNTER — Other Ambulatory Visit: Payer: Self-pay | Admitting: Family

## 2021-07-27 DIAGNOSIS — M5430 Sciatica, unspecified side: Secondary | ICD-10-CM

## 2021-07-27 DIAGNOSIS — N3642 Intrinsic sphincter deficiency (ISD): Secondary | ICD-10-CM | POA: Diagnosis not present

## 2021-08-04 DIAGNOSIS — R350 Frequency of micturition: Secondary | ICD-10-CM | POA: Diagnosis not present

## 2021-08-04 DIAGNOSIS — N3946 Mixed incontinence: Secondary | ICD-10-CM | POA: Diagnosis not present

## 2021-08-09 ENCOUNTER — Telehealth: Payer: Self-pay | Admitting: Family Medicine

## 2021-08-09 NOTE — Telephone Encounter (Signed)
Left message for patient to call back  to schedule Medicare Annual Wellness Visit   Last AWV  08/11/20  Please schedule at anytime with [facility] if patient calls the office back.    77 Minutes appointment   Any questions, please call me at [phone]

## 2021-08-27 ENCOUNTER — Other Ambulatory Visit: Payer: Self-pay | Admitting: Family Medicine

## 2021-08-27 DIAGNOSIS — E7800 Pure hypercholesterolemia, unspecified: Secondary | ICD-10-CM

## 2021-09-13 DIAGNOSIS — N3946 Mixed incontinence: Secondary | ICD-10-CM | POA: Diagnosis not present

## 2021-09-24 ENCOUNTER — Ambulatory Visit: Payer: PPO | Admitting: Family Medicine

## 2021-09-24 DIAGNOSIS — N3642 Intrinsic sphincter deficiency (ISD): Secondary | ICD-10-CM | POA: Diagnosis not present

## 2021-09-27 ENCOUNTER — Ambulatory Visit: Payer: PPO | Admitting: Family Medicine

## 2021-10-07 ENCOUNTER — Other Ambulatory Visit: Payer: Self-pay | Admitting: Family Medicine

## 2021-10-07 DIAGNOSIS — I10 Essential (primary) hypertension: Secondary | ICD-10-CM

## 2021-10-08 ENCOUNTER — Ambulatory Visit: Payer: PPO | Admitting: Orthopaedic Surgery

## 2021-10-12 ENCOUNTER — Encounter: Payer: Self-pay | Admitting: Family Medicine

## 2021-10-12 ENCOUNTER — Telehealth: Payer: Self-pay | Admitting: Family Medicine

## 2021-10-12 NOTE — Telephone Encounter (Signed)
2nd no show, charge generated $50, mailing letter

## 2021-10-13 ENCOUNTER — Ambulatory Visit (INDEPENDENT_AMBULATORY_CARE_PROVIDER_SITE_OTHER): Payer: PPO

## 2021-10-13 ENCOUNTER — Ambulatory Visit: Payer: PPO | Admitting: Physician Assistant

## 2021-10-13 VITALS — Wt 192.0 lb

## 2021-10-13 DIAGNOSIS — Z599 Problem related to housing and economic circumstances, unspecified: Secondary | ICD-10-CM

## 2021-10-13 DIAGNOSIS — R932 Abnormal findings on diagnostic imaging of liver and biliary tract: Secondary | ICD-10-CM | POA: Insufficient documentation

## 2021-10-13 DIAGNOSIS — Z1231 Encounter for screening mammogram for malignant neoplasm of breast: Secondary | ICD-10-CM

## 2021-10-13 DIAGNOSIS — Z0000 Encounter for general adult medical examination without abnormal findings: Secondary | ICD-10-CM

## 2021-10-13 DIAGNOSIS — Z8619 Personal history of other infectious and parasitic diseases: Secondary | ICD-10-CM | POA: Insufficient documentation

## 2021-10-13 DIAGNOSIS — Z5982 Transportation insecurity: Secondary | ICD-10-CM | POA: Diagnosis not present

## 2021-10-13 DIAGNOSIS — K5900 Constipation, unspecified: Secondary | ICD-10-CM | POA: Insufficient documentation

## 2021-10-13 DIAGNOSIS — K219 Gastro-esophageal reflux disease without esophagitis: Secondary | ICD-10-CM | POA: Insufficient documentation

## 2021-10-13 DIAGNOSIS — K8050 Calculus of bile duct without cholangitis or cholecystitis without obstruction: Secondary | ICD-10-CM | POA: Insufficient documentation

## 2021-10-13 DIAGNOSIS — R143 Flatulence: Secondary | ICD-10-CM | POA: Insufficient documentation

## 2021-10-13 DIAGNOSIS — Z5941 Food insecurity: Secondary | ICD-10-CM

## 2021-10-13 DIAGNOSIS — K7400 Hepatic fibrosis, unspecified: Secondary | ICD-10-CM | POA: Insufficient documentation

## 2021-10-13 DIAGNOSIS — Z1211 Encounter for screening for malignant neoplasm of colon: Secondary | ICD-10-CM | POA: Insufficient documentation

## 2021-10-13 DIAGNOSIS — R1011 Right upper quadrant pain: Secondary | ICD-10-CM | POA: Insufficient documentation

## 2021-10-13 NOTE — Progress Notes (Signed)
Subjective:   [name] is a 51 y.o. female who presents for Medicare Annual (Subsequent) preventive examination.  Virtual Visit via Telephone Note  I connected with  [name] on 10/13/21 at 12:45 PM EST by telephone and verified that I am speaking with the correct person using two identifiers.  Location: Patient: home Provider: McKee Persons participating in the virtual visit: patient/Nurse Health Advisor   I discussed the limitations, risks, security and privacy concerns of performing an evaluation and management service by telephone and the availability of in person appointments. The patient expressed understanding and agreed to proceed.  Interactive audio and video telecommunications were attempted between this nurse and patient, however failed, due to patient having technical difficulties OR patient did not have access to video capability.  We continued and completed visit with audio only.  Some vital signs may be absent or patient reported.   Claudia E Patte Winkel, LPN   Review of Systems     Cardiac Risk Factors include: diabetes mellitus;obesity (BMI >30kg/m2);sedentary lifestyle;hypertension;Other (see comment);smoking/ tobacco exposure, Risk factor comments: OSA, hx of Hep C, atherosclerosis     Objective:    Today's Vitals   10/13/21 1250  Weight: 192 lb (87.1 kg)   Body mass index is 32.96 kg/m.  Advanced Directives 10/13/2021 01/30/2021 10/12/2020 10/08/2020 08/11/2020 12/30/2019 12/23/2019  Does Patient Have a Medical Advance Directive? _0  No No  Would patient like information on creating a medical advance directive? No - Patient declined - No - Patient declined - Yes (MAU/Ambulatory/Procedural Areas - Information given) No - Patient declined No - Patient declined  Some encounter information is confidential and restricted. Go to Review Flowsheets activity to see all data.    Current Medications (verified) Outpatient Encounter Medications as  of 10/13/2021  Medication Sig   ALPRAZolam (XANAX) 0.5 MG tablet Take 0.5 mg by mouth 2 (two) times daily as needed for anxiety.   ARIPiprazole (ABILIFY) 5 MG tablet Take 1 tablet (5 mg total) by mouth daily.   atorvastatin (LIPITOR) 20 MG tablet TAKE 1 TABLET(20 MG) BY MOUTH DAILY   blood glucose meter kit and supplies KIT Dispense based on patient and insurance preference. Check glucose before breakfast. ICD: E11.65   clomiPRAMINE (ANAFRANIL) 75 MG capsule Take by mouth.   diclofenac Sodium (VOLTAREN) 1 % GEL Apply topically.   FINACEA 15 % FOAM Apply 1 application topically 2 (two) times daily as needed (rosacea).   gabapentin (NEURONTIN) 300 MG capsule TAKE 1 CAPSULE(300 MG) BY MOUTH THREE TIMES DAILY   glucose blood test strip Use as instructed, check glucose before breakfast. ICD: E11.65   lamoTRIgine (LAMICTAL) 150 MG tablet Take 150 mg by mouth 2 (two) times daily.   Lancets (FREESTYLE) lancets Use as instructed. E11.65   levothyroxine (SYNTHROID) 75 MCG tablet TAKE 1 TABLET(75 MCG) BY MOUTH DAILY BEFORE AND BREAKFAST   lisinopril (ZESTRIL) 10 MG tablet TAKE 1 TABLET(10 MG) BY MOUTH DAILY   metFORMIN (GLUCOPHAGE) 500 MG tablet TAKE 1 TABLET(500 MG) BY MOUTH TWICE DAILY WITH A MEAL   methylphenidate (RITALIN) 20 MG tablet Take 20 mg by mouth 2 (two) times daily.   OVER THE COUNTER MEDICATION Take 2 tablets by mouth every 6 (six) hours as needed (headache). Headache Relief   pantoprazole (PROTONIX) 40 MG tablet Take 1 tablet by mouth daily.   Semaglutide,0.25 or 0.5MG/DOS, 2 MG/1.5ML SOPN Inject 0.5 mg into the skin every Thursday.   [DISCONTINUED] ciprofloxacin (CIPRO) 250 MG  tablet Take 1 tablet (250 mg total) by mouth 2 (two) times daily. (Patient not taking: Reported on 10/13/2021)   [DISCONTINUED] oxybutynin (DITROPAN-XL) 10 MG 24 hr tablet Take 1 tablet (10 mg total) by mouth daily. (Patient not taking: Reported on 10/13/2021)   No facility-administered encounter medications on file as  of 10/13/2021.    Allergies (verified) Pregabalin, Celebrex [celecoxib], Effexor [venlafaxine hcl], Mirapex [pramipexole], Oxybutynin, Pristiq [desvenlafaxine], Prozac [fluoxetine hcl], Symbyax [olanzapine-fluoxetine hcl], and Wellbutrin [bupropion hcl]   History: Past Medical History:  Diagnosis Date   ADHD (attention deficit hyperactivity disorder)    Anemia    with pregnancy   Anxiety    Arthritis    Bipolar disorder (HCC)    Chronic back pain greater than 3 months duration    Depression    Diabetes mellitus without complication ([place])    Diabetic nephropathy ([place])    bilateral feet   Dysuria    has to have self in and out cath    Eroded bladder suspension mesh (HCC)    GERD (gastroesophageal reflux disease)    Hepatitis    C   History of COVID-19 11/2019   History of MRSA infection    after back surgery   History of sleep apnea    resolved after weight loss   Hypertension    Hypothyroidism    Leg pain, bilateral    Obesity    Obsessive-compulsive disorder    Pneumonia 12/24/2019   Rosacea    Sciatic nerve pain    Social anxiety disorder    Squamous cell carcinoma of back 2021   Past Surgical History:  Procedure Laterality Date   BACK SURGERY     total of 6 back surgeries   bladder mesh  2009   DILITATION & CURRETTAGE/HYSTROSCOPY WITH NOVASURE ABLATION N/A 08/19/2016   Procedure: DILATATION & CURETTAGE WITH NOVASURE ABLATION;  Surgeon: [name], MD;  Location: [facility];  Service: Gynecology;  Laterality: N/A;   EUS N/A 09/09/2015   Procedure: UPPER ENDOSCOPIC ULTRASOUND (EUS) RADIAL;  Surgeon: [name], MD;  Location: [facility];  Service: Endoscopy;  Laterality: N/A;   EYE SURGERY     INCONTINENCE SURGERY     SPINAL CORD STIMULATOR INSERTION     SPINAL CORD STIMULATOR REMOVAL     SPINAL FUSION  2008   times two   TOTAL HIP ARTHROPLASTY Left 10/12/2020   Procedure: LEFT TOTAL HIP ARTHROPLASTY ANTERIOR APPROACH;  Surgeon: Leandrew Koyanagi, MD;  Location:  [facility];  Service: Orthopedics;  Laterality: Left;   TUBAL LIGATION  2006   Family History  Problem Relation Age of Onset   Schizophrenia Father    Schizophrenia Cousin    Hyperthyroidism Mother    Hypothyroidism Maternal Grandmother    Social History   Socioeconomic History   Marital status: Legally Separated    Spouse name: Not on file   Number of children: 2   Years of education: Not on file   Highest education level: Not on file  Occupational History   Occupation: disability  Tobacco Use   Smoking status: Former    Packs/day: 1.00    Years: 15.00    Pack years: 15.00    Types: Cigarettes    Quit date: 10/04/2021    Years since quitting: 0.0   Smokeless tobacco: Former   Tobacco comments:    use vapor, social  Vaping Use   Vaping Use: Never used  Substance and Sexual Activity   Alcohol use: No  Drug use: No   Sexual activity: Yes    Birth control/protection: Surgical  Other Topics Concern   Not on file  Social History Narrative   Her 2 children live with her   Social Determinants of Health   Financial Resource Strain: Medium Risk   Difficulty of Paying Living Expenses: Somewhat hard  Food Insecurity: Food Insecurity Present   Worried About Charity fundraiser in the Last Year: Often true   Arboriculturist in the Last Year: Sometimes true  Transportation Needs: Unmet Transportation Needs   Lack of Transportation (Medical): Yes   Lack of Transportation (Non-Medical): No  Physical Activity: Sufficiently Active   Days of Exercise per Week: 7 days   Minutes of Exercise per Session: 30 min  Stress: Stress Concern Present   Feeling of Stress : To some extent  Social Connections: Socially Isolated   Frequency of Communication with Friends and Family: More than three times a week   Frequency of Social Gatherings with Friends and Family: More than three times a week   Attends Religious Services: Never   Marine scientist or Organizations: No   Attends Arts development officer: Never   Marital Status: Separated    Tobacco Counseling Counseling given: Yes Tobacco comments: use vapor, social   Clinical Intake:  Pre-visit preparation completed: Yes  Pain : No/denies pain     BMI - recorded: 32.96 Nutritional Status: BMI > 30  Obese Nutritional Risks: None Diabetes: Yes CBG done?: No Did pt. bring in CBG monitor from home?: No  How often do you need to have someone help you when you read instructions, pamphlets, or other written materials from your doctor or pharmacy?: 1 - Never  Diabetic? Nutrition Risk Assessment:  Has the patient had any N/V/D within the last 2 months?  No  Does the patient have any non-healing wounds?  No  Has the patient had any unintentional weight loss or weight gain?  No   Diabetes:  Is the patient diabetic?  Yes  If diabetic, was a CBG obtained today?  No  Did the patient bring in their glucometer from home?  No  How often do you monitor your CBG's? Once daily fasting - this am 108 per patient.   Financial Strains and Diabetes Management:  Are you having any financial strains with the device, your supplies or your medication? Yes .  Does the patient want to be seen by Chronic Care Management for management of their diabetes?  Yes  Would the patient like to be referred to a Nutritionist or for Diabetic Management?  No   Diabetic Exams:  Diabetic Eye Exam: Completed 07/13/2021.  Diabetic Foot Exam: Completed 12/16/2020. Pt has been advised about the importance in completing this exam. Pt is scheduled for diabetic foot exam on next appt.    Interpreter Needed?: No  Information entered by :: Claudia Cherrise Occhipinti, LPN   Activities of Daily Living In your present state of health, do you have any difficulty performing the following activities: 10/13/2021  Hearing? N  Vision? N  Difficulty concentrating or making decisions? N  Walking or climbing stairs? N  Dressing or bathing? N  Doing errands,  shopping? N  Preparing Food and eating ? N  Using the Toilet? N  In the past six months, have you accidently leaked urine? Y  Comment usually has to wear pantyliners for protection - sees Urology  Do you have problems with loss of bowel control? N  Managing your Medications? N  Managing your Finances? N  Housekeeping or managing your Housekeeping? N  Some recent data might be hidden    Patient Care Team: Libby Maw, MD as PCP - General (Family Medicine) Bjorn Loser, MD as Consulting Physician (Urology) [name], MD as Consulting Physician (Gastroenterology) Sharyne Peach, MD as Consulting Physician (Ophthalmology)  Indicate any recent Medical Services you may have received from other than [facility] providers in the past year (date may be approximate).     Assessment:   This is a routine wellness examination for [name].  Hearing/Vision screen Hearing Screening - Comments:: Denies hearing difficulties   Vision Screening - Comments:: No vision concerns - up to date with routine eye exams with [facility]  Dietary issues and exercise activities discussed: Current Exercise Habits: Home exercise routine, Type of exercise: walking, Time (Minutes): 20, Frequency (Times/Week): 7, Weekly Exercise (Minutes/Week): 140, Intensity: Mild, Exercise limited by: None identified   Goals Addressed             This Visit's Progress    Patient Stated   On track    Continue weight loss & quit smoking       Depression Screen PHQ 2/9 Scores 10/13/2021 06/21/2021 05/03/2021 02/04/2021 12/16/2020 08/11/2020 07/07/2020  PHQ - 2 Score 0 0 0 0 0 1 2  PHQ- 9 Score - - - - - - -  Some encounter information is confidential and restricted. Go to Review Flowsheets activity to see all data.    Fall Risk Fall Risk  10/13/2021 06/21/2021 05/03/2021 02/04/2021 12/16/2020  Falls in the past year? 0 0 0 0 0  Number falls in past yr: 0 0 - - -  Injury with Fall? 0 - - - -  Risk for fall due to : No Fall  Risks - - - -  Follow up Falls prevention discussed - - - -    FALL RISK PREVENTION PERTAINING TO THE HOME:  Any stairs in or around the home? No  If so, are there any without handrails? No  Home free of loose throw rugs in walkways, pet beds, electrical cords, etc? Yes  Adequate lighting in your home to reduce risk of falls? Yes   ASSISTIVE DEVICES UTILIZED TO PREVENT FALLS:  Life alert? No  Use of a cane, walker or w/c? No  Grab bars in the bathroom? No  Shower chair or bench in shower? Yes  Elevated toilet seat or a handicapped toilet? Yes   TIMED UP AND GO:  Was the test performed? No . Telephonic visit  Cognitive Function:     6CIT Screen 10/13/2021  What Year? 0 points  What month? 0 points  What time? 0 points  Count back from 20 0 points  Months in reverse 0 points  Repeat phrase 2 points  Total Score 2    Immunizations Immunization History  Administered Date(s) Administered   Hepatitis A, Ped/Adol-2 Dose 06/17/2016, 12/22/2016   Hepatitis B, adult 06/17/2016, 08/02/2016, 12/22/2016   Influenza,inj,Quad PF,6+ Mos 06/21/2021   Influenza-Unspecified 06/07/2016, 06/04/2020   PFIZER(Purple Top)SARS-COV-2 Vaccination 12/26/2019, 01/18/2020, 07/03/2020   Pneumococcal Polysaccharide-23 10/21/2016   Tdap 10/15/2008, 02/08/2019    TDAP status: Up to date  Flu Vaccine status: Up to date  Pneumococcal vaccine status: Up to date  Covid-19 vaccine status: Completed vaccines  Qualifies for Shingles Vaccine? No   Zostavax completed No     Screening Tests Health Maintenance  Topic Date Due   HIV Screening  Never done  COLONOSCOPY (Pts 45-10yr Insurance coverage will need to be confirmed)  Never done   FOOT EXAM  12/16/2021   HEMOGLOBIN A1C  12/20/2021   OPHTHALMOLOGY EXAM  07/13/2022   PAP SMEAR-Modifier  04/16/2023   TETANUS/TDAP  02/07/2029   INFLUENZA VACCINE  Completed   Hepatitis C Screening  Completed   HPV VACCINES  Aged Out   COVID-19 Vaccine   Discontinued    Health Maintenance  Health Maintenance Due  Topic Date Due   HIV Screening  Never done   COLONOSCOPY (Pts 45-477yrInsurance coverage will need to be confirmed)  Never done    Colonoscopy due at age 3521Mammogram status: Ordered 10/13/2021. Pt provided with contact info and advised to call to schedule appt.     Lung Cancer Screening: (Low Dose CT Chest recommended if Age 49-80ears, 30 pack-year currently smoking OR have quit w/in 15years.) does not qualify.   Additional Screening:  Hepatitis C Screening: does qualify; has hx of Hep C  Vision Screening: Recommended annual ophthalmology exams for early detection of glaucoma and other disorders of the eye. Is the patient up to date with their annual eye exam?  Yes  Who is the provider or what is the name of the office in which the patient attends annual eye exams? [facility] is not established with a provider, would they like to be referred to a provider to establish care? No .   Dental Screening: Recommended annual dental exams for proper oral hygiene  Community Resource Referral / Chronic Care Management: CRR required this visit?  No   CCM required this visit?  No      Plan:     I have personally reviewed and noted the following in the patients chart:   Medical and social history Use of alcohol, tobacco or illicit drugs  Current medications and supplements including opioid prescriptions.  Functional ability and status Nutritional status Physical activity Advanced directives List of other physicians Hospitalizations, surgeries, and ER visits in previous 12 months Vitals Screenings to include cognitive, depression, and falls Referrals and appointments  In addition, I have reviewed and discussed with patient certain preventive protocols, quality metrics, and best practice recommendations. A written personalized care plan for preventive services as well as general preventive health recommendations  were provided to patient.     Claudia SUTPHENLPN   2/01/04/7781 Nurse Notes: CRR and CCM referrals sent to assist with expenses and transportation.

## 2021-10-13 NOTE — Patient Instructions (Signed)
[name] , Thank you for taking time to come for your Medicare Wellness Visit. I appreciate your ongoing commitment to your health goals. Please review the following plan we discussed and let me know if I can assist you in the future.   Screening recommendations/referrals: Colonoscopy: Due at age 49 unless GI recommends sooner Mammogram: Done 5/24/221 - Repeat annually *ordered repeat today Bone Density: Due at age 9 Recommended yearly ophthalmology/optometry visit for glaucoma screening and checkup Recommended yearly dental visit for hygiene and checkup  Vaccinations: Influenza vaccine: Done 06/21/2021 -Repeat annually  Pneumococcal vaccine: Done 10/21/2016 - ask about Prevnar Tdap vaccine: Done 02/08/2019 - Repeat in 10 years Shingles vaccine: Due at age 58  Covid-19: Done 12/26/2019, 01/18/2020, & 07/03/2020  Advanced directives: Advance directive discussed with you today. Even though you declined this today, please call our office should you change your mind, and we can give you the proper paperwork for you to fill out.   Conditions/risks identified: Aim for 30 minutes of exercise or brisk walking each day, drink 6-8 glasses of water and eat lots of fruits and vegetables.   Next appointment: Follow up in one year for your annual wellness visit.   Preventive Care 40-64 Years, Female Preventive care refers to lifestyle choices and visits with your health care provider that can promote health and wellness. What does preventive care include? A yearly physical exam. This is also called an annual well check. Dental exams once or twice a year. Routine eye exams. Ask your health care provider how often you should have your eyes checked. Personal lifestyle choices, including: Daily care of your teeth and gums. Regular physical activity. Eating a healthy diet. Avoiding tobacco and drug use. Limiting alcohol use. Practicing safe sex. Taking low-dose aspirin daily starting at age  38. Taking vitamin and mineral supplements as recommended by your health care provider. What happens during an annual well check? The services and screenings done by your health care provider during your annual well check will depend on your age, overall health, lifestyle risk factors, and family history of disease. Counseling  Your health care provider may ask you questions about your: Alcohol use. Tobacco use. Drug use. Emotional well-being. Home and relationship well-being. Sexual activity. Eating habits. Work and work Statistician. Method of birth control. Menstrual cycle. Pregnancy history. Screening  You may have the following tests or measurements: Height, weight, and BMI. Blood pressure. Lipid and cholesterol levels. These may be checked every 5 years, or more frequently if you are over 13 years old. Skin check. Lung cancer screening. You may have this screening every year starting at age 31 if you have a 30-pack-year history of smoking and currently smoke or have quit within the past 15 years. Fecal occult blood test (FOBT) of the stool. You may have this test every year starting at age 100. Flexible sigmoidoscopy or colonoscopy. You may have a sigmoidoscopy every 5 years or a colonoscopy every 10 years starting at age 49. Hepatitis C blood test. Hepatitis B blood test. Sexually transmitted disease (STD) testing. Diabetes screening. This is done by checking your blood sugar (glucose) after you have not eaten for a while (fasting). You may have this done every 1-3 years. Mammogram. This may be done every 1-2 years. Talk to your health care provider about when you should start having regular mammograms. This may depend on whether you have a family history of breast cancer. BRCA-related cancer screening. This may be done if you have a family history  of breast, ovarian, tubal, or peritoneal cancers. Pelvic exam and Pap test. This may be done every 3 years starting at age 40.  Starting at age 29, this may be done every 5 years if you have a Pap test in combination with an HPV test. Bone density scan. This is done to screen for osteoporosis. You may have this scan if you are at high risk for osteoporosis. Discuss your test results, treatment options, and if necessary, the need for more tests with your health care provider. Vaccines  Your health care provider may recommend certain vaccines, such as: Influenza vaccine. This is recommended every year. Tetanus, diphtheria, and acellular pertussis (Tdap, Td) vaccine. You may need a Td booster every 10 years. Zoster vaccine. You may need this after age 76. Pneumococcal 13-valent conjugate (PCV13) vaccine. You may need this if you have certain conditions and were not previously vaccinated. Pneumococcal polysaccharide (PPSV23) vaccine. You may need one or two doses if you smoke cigarettes or if you have certain conditions. Talk to your health care provider about which screenings and vaccines you need and how often you need them. This information is not intended to replace advice given to you by your health care provider. Make sure you discuss any questions you have with your health care provider. Document Released: 09/18/2015 Document Revised: 05/11/2016 Document Reviewed: 06/23/2015 Elsevier Interactive Patient Education  2017 [place] Prevention in the Home Falls can cause injuries. They can happen to people of all ages. There are many things you can do to make your home safe and to help prevent falls. What can I do on the outside of my home? Regularly fix the edges of walkways and driveways and fix any cracks. Remove anything that might make you trip as you walk through a door, such as a raised step or threshold. Trim any bushes or trees on the path to your home. Use bright outdoor lighting. Clear any walking paths of anything that might make someone trip, such as rocks or tools. Regularly check to see if  handrails are loose or broken. Make sure that both sides of any steps have handrails. Any raised decks and porches should have guardrails on the edges. Have any leaves, snow, or ice cleared regularly. Use sand or salt on walking paths during winter. Clean up any spills in your garage right away. This includes oil or grease spills. What can I do in the bathroom? Use night lights. Install grab bars by the toilet and in the tub and shower. Do not use towel bars as grab bars. Use non-skid mats or decals in the tub or shower. If you need to sit down in the shower, use a plastic, non-slip stool. Keep the floor dry. Clean up any water that spills on the floor as soon as it happens. Remove soap buildup in the tub or shower regularly. Attach bath mats securely with double-sided non-slip rug tape. Do not have throw rugs and other things on the floor that can make you trip. What can I do in the bedroom? Use night lights. Make sure that you have a light by your bed that is easy to reach. Do not use any sheets or blankets that are too big for your bed. They should not hang down onto the floor. Have a firm chair that has side arms. You can use this for support while you get dressed. Do not have throw rugs and other things on the floor that can make you trip.  What can I do in the kitchen? Clean up any spills right away. Avoid walking on wet floors. Keep items that you use a lot in easy-to-reach places. If you need to reach something above you, use a strong step stool that has a grab bar. Keep electrical cords out of the way. Do not use floor polish or wax that makes floors slippery. If you must use wax, use non-skid floor wax. Do not have throw rugs and other things on the floor that can make you trip. What can I do with my stairs? Do not leave any items on the stairs. Make sure that there are handrails on both sides of the stairs and use them. Fix handrails that are broken or loose. Make sure that  handrails are as long as the stairways. Check any carpeting to make sure that it is firmly attached to the stairs. Fix any carpet that is loose or worn. Avoid having throw rugs at the top or bottom of the stairs. If you do have throw rugs, attach them to the floor with carpet tape. Make sure that you have a light switch at the top of the stairs and the bottom of the stairs. If you do not have them, ask someone to add them for you. What else can I do to help prevent falls? Wear shoes that: Do not have high heels. Have rubber bottoms. Are comfortable and fit you well. Are closed at the toe. Do not wear sandals. If you use a stepladder: Make sure that it is fully opened. Do not climb a closed stepladder. Make sure that both sides of the stepladder are locked into place. Ask someone to hold it for you, if possible. Clearly mark and make sure that you can see: Any grab bars or handrails. First and last steps. Where the edge of each step is. Use tools that help you move around (mobility aids) if they are needed. These include: Canes. Walkers. Scooters. Crutches. Turn on the lights when you go into a dark area. Replace any light bulbs as soon as they burn out. Set up your furniture so you have a clear path. Avoid moving your furniture around. If any of your floors are uneven, fix them. If there are any pets around you, be aware of where they are. Review your medicines with your doctor. Some medicines can make you feel dizzy. This can increase your chance of falling. Ask your doctor what other things that you can do to help prevent falls. This information is not intended to replace advice given to you by your health care provider. Make sure you discuss any questions you have with your health care provider. Document Released: 06/18/2009 Document Revised: 01/28/2016 Document Reviewed: 09/26/2014 Elsevier Interactive Patient Education  2017 [employer].

## 2021-10-14 ENCOUNTER — Telehealth: Payer: Self-pay | Admitting: *Deleted

## 2021-10-14 NOTE — Chronic Care Management (AMB) (Signed)
°  Chronic Care Management   Outreach Note  10/14/2021 Name: [name] MRN: [id number] DOB: August 24, 1971  [name] is a 51 y.o. year old female who is a primary care patient of Libby Maw, MD. I reached out to [place] by phone today in response to a referral sent by [name]'s primary care provider.  An unsuccessful telephone outreach was attempted today. The patient was referred to the case management team for assistance with care management and care coordination.   Follow Up Plan: A HIPAA compliant phone message was left for the patient providing contact information and requesting a return call.  If patient returns call to provider office, please advise to call Embedded Care Management Care Guide Luchiano Viscomi at [facility], [facility]  Direct Dial: [phone]

## 2021-10-14 NOTE — Chronic Care Management (AMB) (Signed)
Chronic Care Management   Note  10/14/2021 Name: [name] MRN: [id number] DOB: October 23, 1970  [name] is a 51 y.o. year old female who is a primary care patient of Libby Maw, MD. I reached out to [place] by phone today in response to a referral sent by [name] PCP.  [name] was given information about Chronic Care Management services today including:  CCM service includes personalized support from designated clinical staff supervised by her physician, including individualized plan of care and coordination with other care providers 24/7 contact phone numbers for assistance for urgent and routine care needs. Service will only be billed when office clinical staff spend 20 minutes or more in a month to coordinate care. Only one practitioner may furnish and bill the service in a calendar month. The patient may stop CCM services at any time (effective at the end of the month) by phone call to the office staff. The patient is responsible for co-pay (up to 20% after annual deductible is met) if co-pay is required by the individual health plan.   Patient agreed to services and verbal consent obtained.   Follow up plan: Telephone appointment with care management team member scheduled for: 11/18/2021  Julian Hy, [facility]  Direct Dial: [phone]

## 2021-10-20 DIAGNOSIS — F411 Generalized anxiety disorder: Secondary | ICD-10-CM | POA: Diagnosis not present

## 2021-10-20 DIAGNOSIS — F3132 Bipolar disorder, current episode depressed, moderate: Secondary | ICD-10-CM | POA: Diagnosis not present

## 2021-10-20 DIAGNOSIS — F909 Attention-deficit hyperactivity disorder, unspecified type: Secondary | ICD-10-CM | POA: Diagnosis not present

## 2021-10-20 DIAGNOSIS — F429 Obsessive-compulsive disorder, unspecified: Secondary | ICD-10-CM | POA: Diagnosis not present

## 2021-10-28 DIAGNOSIS — N3946 Mixed incontinence: Secondary | ICD-10-CM | POA: Diagnosis not present

## 2021-10-28 DIAGNOSIS — R350 Frequency of micturition: Secondary | ICD-10-CM | POA: Diagnosis not present

## 2021-10-29 ENCOUNTER — Ambulatory Visit (INDEPENDENT_AMBULATORY_CARE_PROVIDER_SITE_OTHER): Payer: PPO | Admitting: Family Medicine

## 2021-10-29 ENCOUNTER — Encounter: Payer: Self-pay | Admitting: Family Medicine

## 2021-10-29 ENCOUNTER — Other Ambulatory Visit: Payer: Self-pay

## 2021-10-29 VITALS — BP 118/64 | HR 69 | Temp 97.7°F | Ht 64.0 in | Wt 189.6 lb

## 2021-10-29 DIAGNOSIS — M25552 Pain in left hip: Secondary | ICD-10-CM

## 2021-10-29 DIAGNOSIS — E1165 Type 2 diabetes mellitus with hyperglycemia: Secondary | ICD-10-CM | POA: Diagnosis not present

## 2021-10-29 DIAGNOSIS — G2581 Restless legs syndrome: Secondary | ICD-10-CM

## 2021-10-29 DIAGNOSIS — E7800 Pure hypercholesterolemia, unspecified: Secondary | ICD-10-CM | POA: Diagnosis not present

## 2021-10-29 DIAGNOSIS — E538 Deficiency of other specified B group vitamins: Secondary | ICD-10-CM | POA: Diagnosis not present

## 2021-10-29 DIAGNOSIS — G8929 Other chronic pain: Secondary | ICD-10-CM

## 2021-10-29 DIAGNOSIS — M25561 Pain in right knee: Secondary | ICD-10-CM | POA: Diagnosis not present

## 2021-10-29 DIAGNOSIS — N912 Amenorrhea, unspecified: Secondary | ICD-10-CM

## 2021-10-29 DIAGNOSIS — M25551 Pain in right hip: Secondary | ICD-10-CM

## 2021-10-29 DIAGNOSIS — M25562 Pain in left knee: Secondary | ICD-10-CM | POA: Diagnosis not present

## 2021-10-29 LAB — LIPID PANEL
Cholesterol: 135 mg/dL (ref 0–200)
HDL: 38.3 mg/dL — ABNORMAL LOW (ref >39.00)
LDL Cholesterol: 76 mg/dL (ref 0–99)
NonHDL: 96.65
Total CHOL/HDL Ratio: 4
Triglycerides: 103 mg/dL (ref 0.0–149.0)
VLDL: 20.6 mg/dL (ref 0.0–40.0)

## 2021-10-29 LAB — BASIC METABOLIC PANEL
BUN: 9 mg/dL (ref 6–23)
CO2: 35 mEq/L — ABNORMAL HIGH (ref 19–32)
Calcium: 9.7 mg/dL (ref 8.4–10.5)
Chloride: 102 mEq/L (ref 96–112)
Creatinine, Ser: 0.69 mg/dL (ref 0.40–1.20)
GFR: 102.31 mL/min (ref >60.00)
Glucose, Bld: 91 mg/dL (ref 70–99)
Potassium: 3.9 mEq/L (ref 3.5–5.1)
Sodium: 139 mEq/L (ref 135–145)

## 2021-10-29 LAB — HEMOGLOBIN A1C: Hgb A1c MFr Bld: 5.5 % (ref 4.6–6.5)

## 2021-10-29 LAB — HCG, SERUM, QUALITATIVE: Preg, Serum: NEGATIVE

## 2021-10-29 LAB — FOLLICLE STIMULATING HORMONE: FSH: 17 m[IU]/mL

## 2021-10-29 LAB — VITAMIN B12: Vitamin B-12: 367 pg/mL (ref 211–911)

## 2021-10-29 MED ORDER — SEMAGLUTIDE(0.25 OR 0.5MG/DOS) 2 MG/1.5ML ~~LOC~~ SOPN: 0.5000 mg | PEN_INJECTOR | SUBCUTANEOUS | 1 refills | Status: AC

## 2021-10-29 MED ORDER — GABAPENTIN 300 MG PO CAPS: 300.0000 mg | ORAL_CAPSULE | Freq: Three times a day (TID) | ORAL | 2 refills | Status: DC

## 2021-10-29 NOTE — Progress Notes (Signed)
Established Patient Office Visit  Subjective:  Patient ID: [name], female    DOB: 06-26-1971  Age: 51 y.o. MRN: [id number]  CC:  Chief Complaint  Patient presents with   Hair/Scalp Problem    Concerns about hair loss and no period x 4 months per patient not sure if this is menopause or thyroid issues.     HPI [name] presents for follow-up of diabetes, elevated lipids, chronic knee pain and restless leg syndrome, amenorrhea B12 deficiency.  Has been able to lose some weight.  Knees are better but they continue to bother her some.  Semaglutide has helped a great deal of diabetes and weight loss.  Past Medical History:  Diagnosis Date   ADHD (attention deficit hyperactivity disorder)    Anemia    with pregnancy   Anxiety    Arthritis    Bipolar disorder (HCC)    Chronic back pain greater than 3 months duration    Depression    Diabetes mellitus without complication (HCC)    Diabetic nephropathy ([place])    bilateral feet   Dysuria    has to have self in and out cath    Eroded bladder suspension mesh (HCC)    GERD (gastroesophageal reflux disease)    Hepatitis    C   History of COVID-19 11/2019   History of MRSA infection    after back surgery   History of sleep apnea    resolved after weight loss   Hypertension    Hypothyroidism    Leg pain, bilateral    Obesity    Obsessive-compulsive disorder    Pneumonia 12/24/2019   Rosacea    Sciatic nerve pain    Social anxiety disorder    Squamous cell carcinoma of back 2021    Past Surgical History:  Procedure Laterality Date   BACK SURGERY     total of 6 back surgeries   bladder mesh  2009   DILITATION & CURRETTAGE/HYSTROSCOPY WITH NOVASURE ABLATION N/A 08/19/2016   Procedure: DILATATION & CURETTAGE WITH NOVASURE ABLATION;  Surgeon: [name], MD;  Location: [facility];  Service: Gynecology;  Laterality: N/A;   EUS N/A 09/09/2015   Procedure: UPPER ENDOSCOPIC ULTRASOUND (EUS) RADIAL;  Surgeon: [name], MD;  Location: [facility];  Service: Endoscopy;  Laterality: N/A;   EYE SURGERY     INCONTINENCE SURGERY     SPINAL CORD STIMULATOR INSERTION     SPINAL CORD STIMULATOR REMOVAL     SPINAL FUSION  2008   times two   TOTAL HIP ARTHROPLASTY Left 10/12/2020   Procedure: LEFT TOTAL HIP ARTHROPLASTY ANTERIOR APPROACH;  Surgeon: Leandrew Koyanagi, MD;  Location: [facility];  Service: Orthopedics;  Laterality: Left;   TUBAL LIGATION  2006    Family History  Problem Relation Age of Onset   Schizophrenia Father    Schizophrenia Cousin    Hyperthyroidism Mother    Hypothyroidism Maternal Grandmother     Social History   Socioeconomic History   Marital status: Legally Separated    Spouse name: Not on file   Number of children: 2   Years of education: Not on file   Highest education level: Not on file  Occupational History   Occupation: disability  Tobacco Use   Smoking status: Former    Packs/day: 1.00    Years: 15.00    Pack years: 15.00    Types: Cigarettes    Quit date: 10/04/2021    Years since quitting:  0.0   Smokeless tobacco: Former   Tobacco comments:    use vapor, social  Vaping Use   Vaping Use: Never used  Substance and Sexual Activity   Alcohol use: No   Drug use: No   Sexual activity: Yes    Birth control/protection: Surgical  Other Topics Concern   Not on file  Social History Narrative   Her 2 children live with her   Social Determinants of Health   Financial Resource Strain: Medium Risk   Difficulty of Paying Living Expenses: Somewhat hard  Food Insecurity: Landscape architect Present   Worried About Charity fundraiser in the Last Year: Often true   Arboriculturist in the Last Year: Sometimes true  Transportation Needs: Unmet Transportation Needs   Lack of Transportation (Medical): Yes   Lack of Transportation (Non-Medical): No  Physical Activity: Sufficiently Active   Days of Exercise per Week: 7 days   Minutes of Exercise per Session: 30 min  Stress:  Stress Concern Present   Feeling of Stress : To some extent  Social Connections: Socially Isolated   Frequency of Communication with Friends and Family: More than three times a week   Frequency of Social Gatherings with Friends and Family: More than three times a week   Attends Religious Services: Never   Marine scientist or Organizations: No   Attends Music therapist: Never   Marital Status: Separated  Intimate Partner Violence: Not At Risk   Fear of Current or Ex-Partner: No   Emotionally Abused: No   Physically Abused: No   Sexually Abused: No    Outpatient Medications Prior to Visit  Medication Sig Dispense Refill   ALPRAZolam (XANAX) 0.5 MG tablet Take 0.5 mg by mouth 2 (two) times daily as needed for anxiety.     ARIPiprazole (ABILIFY) 5 MG tablet Take 1 tablet (5 mg total) by mouth daily. 30 tablet 2   atorvastatin (LIPITOR) 20 MG tablet TAKE 1 TABLET(20 MG) BY MOUTH DAILY 100 tablet 3   blood glucose meter kit and supplies KIT Dispense based on patient and insurance preference. Check glucose before breakfast. ICD: E11.65 1 each 0   diclofenac Sodium (VOLTAREN) 1 % GEL Apply topically.     FINACEA 15 % FOAM Apply 1 application topically 2 (two) times daily as needed (rosacea).     glucose blood test strip Use as instructed, check glucose before breakfast. ICD: E11.65 100 each 12   lamoTRIgine (LAMICTAL) 150 MG tablet Take 150 mg by mouth 2 (two) times daily.     Lancets (FREESTYLE) lancets Use as instructed. E11.65 100 each 12   levothyroxine (SYNTHROID) 75 MCG tablet TAKE 1 TABLET(75 MCG) BY MOUTH DAILY BEFORE AND BREAKFAST 90 tablet 3   lisinopril (ZESTRIL) 10 MG tablet TAKE 1 TABLET(10 MG) BY MOUTH DAILY 90 tablet 3   metFORMIN (GLUCOPHAGE) 500 MG tablet TAKE 1 TABLET(500 MG) BY MOUTH TWICE DAILY WITH A MEAL 180 tablet 0   methylphenidate (RITALIN) 20 MG tablet Take 20 mg by mouth 2 (two) times daily.     OVER THE COUNTER MEDICATION Take 2 tablets by mouth  every 6 (six) hours as needed (headache). Headache Relief     pantoprazole (PROTONIX) 40 MG tablet Take 1 tablet by mouth daily.     gabapentin (NEURONTIN) 300 MG capsule TAKE 1 CAPSULE(300 MG) BY MOUTH THREE TIMES DAILY 90 capsule 2   Semaglutide,0.25 or 0.5MG/DOS, 2 MG/1.5ML SOPN Inject 0.5 mg into the skin  every Thursday. 4.5 mL 3   clomiPRAMINE (ANAFRANIL) 75 MG capsule Take by mouth. (Patient not taking: Reported on 10/29/2021)     No facility-administered medications prior to visit.    Allergies  Allergen Reactions   Pregabalin Anaphylaxis   Celebrex [Celecoxib] Nausea And Vomiting    Stomach pain   Effexor [Venlafaxine Hcl] Other (See Comments)    Serotonin toxicity   Mirapex [Pramipexole]     Mirapex- Worsening "body jerks"   Oxybutynin Other (See Comments)    Urinary retention   Pristiq [Desvenlafaxine] Other (See Comments)    Serotonin toxicity    Prozac [Fluoxetine Hcl] Swelling   Symbyax [Olanzapine-Fluoxetine Hcl] Swelling   Wellbutrin [Bupropion Hcl] Swelling    ROS Review of Systems  Constitutional:  Negative for chills, diaphoresis, fatigue, fever and unexpected weight change.  HENT: Negative.    Eyes:  Negative for photophobia and visual disturbance.  Respiratory: Negative.    Cardiovascular: Negative.   Gastrointestinal: Negative.   Endocrine: Negative for polyphagia and polyuria.  Genitourinary: Negative.   Musculoskeletal:  Negative for gait problem and joint swelling.  Skin: Negative.   Neurological:  Negative for speech difficulty, weakness and numbness.     Objective:    Physical Exam Vitals and nursing note reviewed.  Constitutional:      General: She is not in acute distress.    Appearance: Normal appearance. She is not ill-appearing, toxic-appearing or diaphoretic.  HENT:     Head: Normocephalic and atraumatic.     Right Ear: Tympanic membrane, ear canal and external ear normal.     Left Ear: Tympanic membrane, ear canal and external ear  normal.     Mouth/Throat:     Mouth: Mucous membranes are moist.     Pharynx: Oropharynx is clear. No oropharyngeal exudate or posterior oropharyngeal erythema.  Eyes:     General: No scleral icterus.       Right eye: No discharge.        Left eye: No discharge.     Extraocular Movements: Extraocular movements intact.     Conjunctiva/sclera: Conjunctivae normal.     Pupils: Pupils are equal, round, and reactive to light.  Neck:     Vascular: No carotid bruit.  Cardiovascular:     Rate and Rhythm: Normal rate and regular rhythm.  Pulmonary:     Effort: Pulmonary effort is normal.     Breath sounds: Normal breath sounds.  Abdominal:     General: Bowel sounds are normal.  Musculoskeletal:     Cervical back: No rigidity or tenderness.  Lymphadenopathy:     Cervical: No cervical adenopathy.  Skin:    General: Skin is warm and dry.  Neurological:     Mental Status: She is alert and oriented to person, place, and time.  Psychiatric:        Mood and Affect: Mood normal.        Behavior: Behavior normal.    BP 118/64 (BP Location: Right Arm, Patient Position: Sitting, Cuff Size: Normal)    Pulse 69    Temp 97.7 F (36.5 C) (Temporal)    Ht 5' 4"  (1.626 m)    Wt 189 lb 9.6 oz (86 kg)    SpO2 100%    BMI 32.54 kg/m  Wt Readings from Last 3 Encounters:  10/29/21 189 lb 9.6 oz (86 kg)  10/13/21 192 lb (87.1 kg)  07/05/21 202 lb (91.6 kg)     Health Maintenance Due  Topic Date Due  HIV Screening  Never done    There are no preventive care reminders to display for this patient.  Lab Results  Component Value Date   TSH 1.09 06/21/2021   Lab Results  Component Value Date   WBC 10.8 (H) 06/21/2021   HGB 13.1 06/21/2021   HCT 38.8 06/21/2021   MCV 81.6 06/21/2021   PLT 274.0 06/21/2021   Lab Results  Component Value Date   NA 137 06/21/2021   K 4.0 06/21/2021   CO2 29 06/21/2021   GLUCOSE 91 06/21/2021   BUN 10 06/21/2021   CREATININE 0.77 06/21/2021   BILITOT 0.3  12/16/2020   ALKPHOS 87 12/16/2020   AST 11 12/16/2020   ALT 17 12/16/2020   PROT 6.6 12/16/2020   ALBUMIN 3.9 12/16/2020   CALCIUM 9.2 06/21/2021   ANIONGAP 8 10/13/2020   GFR 91.16 06/21/2021   Lab Results  Component Value Date   CHOL 166 11/12/2018   Lab Results  Component Value Date   HDL 35.80 (L) 11/12/2018   Lab Results  Component Value Date   LDLCALC 174 (H) 04/15/2015   Lab Results  Component Value Date   TRIG 221.0 (H) 11/12/2018   Lab Results  Component Value Date   CHOLHDL 5 11/12/2018   Lab Results  Component Value Date   HGBA1C 5.4 06/21/2021      Assessment & Plan:   Problem List Items Addressed This Visit       Endocrine   Type 2 diabetes mellitus with hyperglycemia, without long-term current use of insulin (HCC)   Relevant Medications   Semaglutide,0.25 or 0.5MG/DOS, 2 MG/1.5ML SOPN (Start on 11/04/2021)   Other Relevant Orders   Hemoglobin Y8M   Basic metabolic panel     Other   B12 deficiency   Relevant Orders   Vitamin B12   Chronic arthralgias of knees and hips - Primary   Relevant Medications   gabapentin (NEURONTIN) 300 MG capsule   RLS (restless legs syndrome)   Relevant Medications   gabapentin (NEURONTIN) 300 MG capsule   Amenorrhea   Relevant Orders   FSH   hCG, serum, qualitative   Other Visit Diagnoses     Elevated LDL cholesterol level       Relevant Orders   Lipid panel       Meds ordered this encounter  Medications   gabapentin (NEURONTIN) 300 MG capsule    Sig: Take 1 capsule (300 mg total) by mouth 3 (three) times daily.    Dispense:  90 capsule    Refill:  2   Semaglutide,0.25 or 0.5MG/DOS, 2 MG/1.5ML SOPN    Sig: Inject 0.5 mg into the skin every Thursday for 28 days.    Dispense:  4.5 mL    Refill:  1    Follow-up: Return in about 6 months (around 04/28/2022).   Continue medicines as above. Libby Maw, MD

## 2021-11-05 ENCOUNTER — Encounter: Payer: Self-pay | Admitting: Physician Assistant

## 2021-11-05 ENCOUNTER — Ambulatory Visit (INDEPENDENT_AMBULATORY_CARE_PROVIDER_SITE_OTHER): Payer: PPO

## 2021-11-05 ENCOUNTER — Other Ambulatory Visit: Payer: Self-pay

## 2021-11-05 ENCOUNTER — Ambulatory Visit (INDEPENDENT_AMBULATORY_CARE_PROVIDER_SITE_OTHER): Payer: PPO | Admitting: Orthopaedic Surgery

## 2021-11-05 DIAGNOSIS — Z96642 Presence of left artificial hip joint: Secondary | ICD-10-CM

## 2021-11-05 NOTE — Progress Notes (Signed)
? ?Post-Op Visit Note ?  ?Patient: [name]           ?Date of Birth: [birth year]           ?MRN: [id number] ?Visit Date: 11/05/2021 ?PCP: Libby Maw, MD ? ? ?Assessment & Plan: ? ?Chief Complaint:  ?Chief Complaint  ?Patient presents with  ? Left Hip - Follow-up  ? ?Visit Diagnoses:  ?1. History of total hip replacement, left   ? ? ?Plan: Patient is a pleasant 51 year old female who comes in today 1 year status post left total hip replacement, date of surgery 10/12/2020.  She has been doing great.  No complaints of pain.  Examination left hip reveals a painless logroll.  Full range of motion and strength.  She is neurovascular intact distally.  At this point, we have recommended dental prophylaxis for another year.  Follow-up with [place] in 1 years time for repeat evaluation and AP pelvis x-rays.  Call with concerns or questions. ? ?Follow-Up Instructions: Return in about 1 year (around 11/06/2022).  ? ?Orders:  ?Orders Placed This Encounter  ?Procedures  ? XR Pelvis 1-2 Views  ? ?No orders of the defined types were placed in this encounter. ? ? ?Imaging: ?XR Pelvis 1-2 Views ? ?Result Date: 11/05/2021 ?Well-seated prosthesis without complication  ? ?PMFS History: ?Patient Active Problem List  ? Diagnosis Date Noted  ? Amenorrhea 10/29/2021  ? Abnormal findings on diagnostic imaging of liver and biliary tract 10/13/2021  ? Colon cancer screening 10/13/2021  ? Constipation 10/13/2021  ? Gallstones, common bile duct 10/13/2021  ? Gastroesophageal reflux disease 10/13/2021  ? History of hepatitis C 10/13/2021  ? Intestinal gas excretion 10/13/2021  ? Liver fibrosis 10/13/2021  ? Right upper quadrant pain 10/13/2021  ? Stress 06/21/2021  ? Need for influenza vaccination 06/21/2021  ? Periorbital hyperpigmentation 05/03/2021  ? Abnormal CBC 05/03/2021  ? Lower respiratory infection 02/04/2021  ? Hematuria 12/28/2020  ? Status post total replacement of left hip 10/12/2020  ? Type 2 diabetes mellitus with  hyperglycemia, without long-term current use of insulin ([place]) 04/02/2020  ? Peripheral edema 04/02/2020  ? COVID-19 07/09/2019  ? Left hip pain 04/30/2019  ? Otitis externa of left ear 04/30/2019  ? Cholesteatoma of left ear 04/30/2019  ? Dysfunction of left eustachian tube 04/30/2019  ? Left ear pain 04/30/2019  ? Sciatica 04/19/2019  ? Class 3 severe obesity due to excess calories with serious comorbidity and body mass index (BMI) of 45.0 to 49.9 in adult [facility]) 04/19/2019  ? Post-nasal drip 01/10/2019  ? Allergic cough 01/10/2019  ? Obstructive sleep apnea syndrome 12/11/2018  ? Acute frontal sinusitis 12/10/2018  ? Primary osteoarthritis of left hip 11/13/2018  ? Morbid obesity ([place]) 11/13/2018  ? Elevated cholesterol 11/12/2018  ? Essential hypertension 11/12/2018  ? Allergic rhinitis due to pollen 11/12/2018  ? Reactive airway disease 11/12/2018  ? Breast pain, right 07/17/2018  ? Prediabetes 07/17/2018  ? Refused influenza vaccine 07/17/2018  ? Chronic nonintractable headache 02/12/2018  ? Dysuria 02/12/2018  ? Localized edema 02/12/2018  ? Hospital discharge follow-up 01/23/2018  ? Bladder problem 01/05/2018  ? Depression 01/05/2018  ? Insomnia 01/05/2018  ? Hepatitis C infection 12/01/2015  ? RLS (restless legs syndrome) 10/23/2015  ? Elevated liver enzymes 10/22/2015  ? Recurrent major depression-severe ([place]) 04/17/2015  ? GAD (generalized anxiety disorder) 04/15/2015  ? Bipolar depression ([place]) 04/15/2015  ? Severe recurrent major depression w/psychotic features, mood-congruent ([place]) 04/14/2015  ? Anxiety disorder 04/14/2015  ?  Major depressive disorder, recurrent severe without psychotic features ([place]) 04/14/2015  ? Suicidal ideations   ? Chronic LBP 01/02/2015  ? Adult hypothyroidism 06/25/2012  ? B12 deficiency 06/25/2012  ? Fatigue 06/25/2012  ? Chronic arthralgias of knees and hips 06/25/2012  ? Leukocytosis 06/25/2012  ? Vitamin D deficiency 06/25/2012  ? ?Past Medical History:  ?Diagnosis Date  ?  ADHD (attention deficit hyperactivity disorder)   ? Anemia   ? with pregnancy  ? Anxiety   ? Arthritis   ? Bipolar disorder ([place])   ? Chronic back pain greater than 3 months duration   ? Depression   ? Diabetes mellitus without complication ([place])   ? Diabetic nephropathy ([place])   ? bilateral feet  ? Dysuria   ? has to have self in and out cath   ? Eroded bladder suspension mesh (H[facility])   ? GERD (gastroesophageal reflux disease)   ? Hepatitis   ? C  ? History of COVID-19 11/2019  ? History of MRSA infection   ? after back surgery  ? History of sleep apnea   ? resolved after weight loss  ? Hypertension   ? Hypothyroidism   ? Leg pain, bilateral   ? Obesity   ? Obsessive-compulsive disorder   ? Pneumonia 12/24/2019  ? Rosacea   ? Sciatic nerve pain   ? Social anxiety disorder   ? Squamous cell carcinoma of back 2021  ?  ?Family History  ?Problem Relation Age of Onset  ? Schizophrenia Father   ? Schizophrenia Cousin   ? Hyperthyroidism Mother   ? Hypothyroidism Maternal Grandmother   ?  ?Past Surgical History:  ?Procedure Laterality Date  ? BACK SURGERY    ? total of 6 back surgeries  ? bladder mesh  2009  ? DILITATION & CURRETTAGE/HYSTROSCOPY WITH NOVASURE ABLATION N/A 08/19/2016  ? Procedure: DILATATION & CURETTAGE WITH NOVASURE ABLATION;  Surgeon: [name], MD;  Location: [facility];  Service: Gynecology;  Laterality: N/A;  ? EUS N/A 09/09/2015  ? Procedure: UPPER ENDOSCOPIC ULTRASOUND (EUS) RADIAL;  Surgeon: [name], MD;  Location: [facility];  Service: Endoscopy;  Laterality: N/A;  ? EYE SURGERY    ? INCONTINENCE SURGERY    ? SPINAL CORD STIMULATOR INSERTION    ? SPINAL CORD STIMULATOR REMOVAL    ? SPINAL FUSION  2008  ? times two  ? TOTAL HIP ARTHROPLASTY Left 10/12/2020  ? Procedure: LEFT TOTAL HIP ARTHROPLASTY ANTERIOR APPROACH;  Surgeon: Leandrew Koyanagi, MD;  Location: [facility];  Service: Orthopedics;  Laterality: Left;  ? TUBAL LIGATION  2006  ? ?Social History  ? ?Occupational History  ? Occupation:  disability  ?Tobacco Use  ? Smoking status: Former  ?  Packs/day: 1.00  ?  Years: 15.00  ?  Pack years: 15.00  ?  Types: Cigarettes  ?  Quit date: 10/04/2021  ?  Years since quitting: 0.0  ? Smokeless tobacco: Former  ? Tobacco comments:  ?  use vapor, social  ?Vaping Use  ? Vaping Use: Never used  ?Substance and Sexual Activity  ? Alcohol use: No  ? Drug use: No  ? Sexual activity: Yes  ?  Birth control/protection: Surgical  ? ? ? ?

## 2021-11-08 ENCOUNTER — Other Ambulatory Visit: Payer: Self-pay | Admitting: Family Medicine

## 2021-11-08 DIAGNOSIS — E1165 Type 2 diabetes mellitus with hyperglycemia: Secondary | ICD-10-CM

## 2021-11-09 ENCOUNTER — Telehealth: Payer: Self-pay

## 2021-11-09 NOTE — Progress Notes (Signed)
? ? ?Chronic Care Management ?Pharmacy Assistant  ? ?Name: [name]  MRN: [id number] DOB: [birth year] ? ?Chart Review for the clinical pharmacist on 11/18/2021 at 9:30 am. ? ?Conditions to be addressed/monitored: ?HTN, HLD, DMII, Anxiety, Depression, Bipolar Disorder, GERD, Hypothyroidism, Allergic Rhinitis, Osteoarthritis, and Sciatica, Chronic LBP, B12 deficiency, Insomnia, Vitamin D deficiency ? ?Primary concerns for visit include: ?Affordability issue with all medications. ?Concerns with levothyroxine   ? ?Recent office visits:  ?10/29/2021 Dr. Ethelene Hal MD (PCP)No medication Changes noted, Return in about 6 months  ?10/13/2021 Janeen Hopkins LPN (PCP Office) Per note patient reported not taking Ciprofloxacin, and Oxybutynin, AMB Referral to [place] ?06/21/2021 Dr. Ethelene Hal MD (PCP)No Medication Changes noted,  Return in about 6 months  ? ?Recent consult visits:  ?11/05/2021 Dr. Erlinda Hong MD (Orthopedic) No medication Changes Noted,  ?Return in about 1 year  ?08/04/2021 Bjorn Loser MD (Urology) Unable to see note ?07/27/2021 Bjorn Loser MD (Urology) Unable to see note ?07/21/2021 Stephannie Peters NP [facility]) Unable to see note ?07/13/2021 Marygrace Drought MD (Ophthalmology) Unable to see note ?07/05/2021 Dr. Matilde Sprang MD (Urology) star ciprofloxacin HCI 250 mg 2 times daily, Change clomipramine HCI to 75 mg daily ?06/21/2021 Dr. Matilde Sprang MD (Urology) stop mirabegron, start Oxybutynin ER 10 mg daily ?06/03/2021 Stephannie Peters [facility]) Unable to see note ?05/24/2021 Dr. Matilde Sprang MD (Urology) start mirabegron ER 50 MG daily , Return in about 6 weeks  ? ?Hospital visits:  ?None in previous 6 months ? ?Have you seen any other providers since your last visit?  ? Patient denies seeing any other providers. ? ?Any changes in your medications or health?  ? Patient denies any changes in her medications or health. ? ?Any side effects from any medications?  ? Patient denies any side  effects from her current medications. ? ?Do you have an symptoms or problems not managed by your medications?  ? Patient denies any issue at this time. ? ?Any concerns about your health right now?  ? Patient reports she has concerns regarding her levothyroxine.Patient states she thinks it is affecting her hair and hormones. ? ?Has your provider asked that you check blood pressure, blood sugar, or follow special diet at home?  ? Patient reports checking her blood sugar at home. ? ?Do you get any type of exercise on a regular basis?  ? Patient states she does not exercise. ? ?Can you think of a goal you would like to reach for your health?  ? Patient will inform the clinical pharmacist. ? ?Do you have any problems getting your medications?  ? Patient reports she is having affordability issue with all her medications. ? ?Is there anything that you would like to discuss during the appointment?  ? Affordability issue with all medications. ? Concerns with levothyroxine  ? ?Please bring medications and supplements to appointment ? Patient is aware to have all her medications and supplements at her appointment. ? ?Medications: ?Outpatient Encounter Medications as of 11/09/2021  ?Medication Sig Note  ? ALPRAZolam (XANAX) 0.5 MG tablet Take 0.5 mg by mouth 2 (two) times daily as needed for anxiety. 10/13/2021: Now taking TID  ? ARIPiprazole (ABILIFY) 5 MG tablet Take 1 tablet (5 mg total) by mouth daily.   ? atorvastatin (LIPITOR) 20 MG tablet TAKE 1 TABLET(20 MG) BY MOUTH DAILY   ? blood glucose meter kit and supplies KIT Dispense based on patient and insurance preference. Check glucose before breakfast. ICD: E11.65   ? clomiPRAMINE (ANAFRANIL) 75 MG capsule  Take by mouth. (Patient not taking: Reported on 10/29/2021)   ? diclofenac Sodium (VOLTAREN) 1 % GEL Apply topically.   ? FINACEA 15 % FOAM Apply 1 application topically 2 (two) times daily as needed (rosacea). 04/08/2020: PRN  ? gabapentin (NEURONTIN) 300 MG capsule Take 1  capsule (300 mg total) by mouth 3 (three) times daily.   ? glucose blood test strip Use as instructed, check glucose before breakfast. ICD: E11.65   ? lamoTRIgine (LAMICTAL) 150 MG tablet Take 150 mg by mouth 2 (two) times daily.   ? Lancets (FREESTYLE) lancets Use as instructed. E11.65   ? levothyroxine (SYNTHROID) 75 MCG tablet TAKE 1 TABLET(75 MCG) BY MOUTH DAILY BEFORE AND BREAKFAST   ? lisinopril (ZESTRIL) 10 MG tablet TAKE 1 TABLET(10 MG) BY MOUTH DAILY   ? metFORMIN (GLUCOPHAGE) 500 MG tablet TAKE 1 TABLET(500 MG) BY MOUTH TWICE DAILY WITH A MEAL   ? methylphenidate (RITALIN) 20 MG tablet Take 20 mg by mouth 2 (two) times daily.   ? OVER THE COUNTER MEDICATION Take 2 tablets by mouth every 6 (six) hours as needed (headache). Headache Relief   ? pantoprazole (PROTONIX) 40 MG tablet Take 1 tablet by mouth daily.   ? Semaglutide,0.25 or 0.5MG/DOS, 2 MG/1.5ML SOPN Inject 0.5 mg into the skin every Thursday for 28 days.   ? ?No facility-administered encounter medications on file as of 11/09/2021.  ? ? ?Care Gaps: ?HIV Screening ? ?Star Rating Drugs: ?Metformin 500 mg last filled on 07/05/2021 90 day supply at [facility]. ?Ozempic 0.5 mg last filled on 10/14/2021 for 54 day supply at [facility]. ?Lisinopril 10 mg last filled on 10/04/2021 for 54 day supply at [facility]. ?Atorvastatin 20 mg last filled on 08/27/2021 for 90 day supply at [facility]. ? ?Medications Fill Gaps: ?None ID ? ?Bessie Kellihan,CPA ?Clinical Pharmacist Assistant ?[phone]  ? ?

## 2021-11-15 ENCOUNTER — Other Ambulatory Visit: Payer: Self-pay

## 2021-11-16 ENCOUNTER — Encounter: Payer: Self-pay | Admitting: Family Medicine

## 2021-11-16 ENCOUNTER — Ambulatory Visit (INDEPENDENT_AMBULATORY_CARE_PROVIDER_SITE_OTHER): Payer: PPO | Admitting: Family Medicine

## 2021-11-16 VITALS — BP 116/67 | HR 83 | Temp 98.3°F | Ht 64.0 in | Wt 183.0 lb

## 2021-11-16 DIAGNOSIS — E039 Hypothyroidism, unspecified: Secondary | ICD-10-CM

## 2021-11-16 DIAGNOSIS — R5383 Other fatigue: Secondary | ICD-10-CM | POA: Diagnosis not present

## 2021-11-16 NOTE — Progress Notes (Signed)
? ?Established Patient Office Visit ? ?Subjective:  ?Patient ID: [name], female    DOB: 1971-05-22  Age: 51 y.o. MRN: [id number] ? ?CC:  ?Chief Complaint  ?Patient presents with  ? Fatigue  ?  Very tired all the time, cold more than normal, hair brittle and falling out would like thyroid checked.   ? ? ?HPI ?[name] presents for ongoing fatigue, cold intolerance, brittle hair and some constipation.  ? ?Past Medical History:  ?Diagnosis Date  ? ADHD (attention deficit hyperactivity disorder)   ? Anemia   ? with pregnancy  ? Anxiety   ? Arthritis   ? Bipolar disorder ([place])   ? Chronic back pain greater than 3 months duration   ? Depression   ? Diabetes mellitus without complication ([place])   ? Diabetic nephropathy ([place])   ? bilateral feet  ? Dysuria   ? has to have self in and out cath   ? Eroded bladder suspension mesh ([facility])   ? GERD (gastroesophageal reflux disease)   ? Hepatitis   ? C  ? History of COVID-19 11/2019  ? History of MRSA infection   ? after back surgery  ? History of sleep apnea   ? resolved after weight loss  ? Hypertension   ? Hypothyroidism   ? Leg pain, bilateral   ? Obesity   ? Obsessive-compulsive disorder   ? Pneumonia 12/24/2019  ? Rosacea   ? Sciatic nerve pain   ? Social anxiety disorder   ? Squamous cell carcinoma of back 2021  ? ? ?Past Surgical History:  ?Procedure Laterality Date  ? BACK SURGERY    ? total of 6 back surgeries  ? bladder mesh  2009  ? DILITATION & CURRETTAGE/HYSTROSCOPY WITH NOVASURE ABLATION N/A 08/19/2016  ? Procedure: DILATATION & CURETTAGE WITH NOVASURE ABLATION;  Surgeon: [name], MD;  Location: [facility];  Service: Gynecology;  Laterality: N/A;  ? EUS N/A 09/09/2015  ? Procedure: UPPER ENDOSCOPIC ULTRASOUND (EUS) RADIAL;  Surgeon: [name], MD;  Location: [facility];  Service: Endoscopy;  Laterality: N/A;  ? EYE SURGERY    ? INCONTINENCE SURGERY    ? SPINAL CORD STIMULATOR INSERTION    ? SPINAL CORD STIMULATOR REMOVAL    ? SPINAL FUSION  2008  ?  times two  ? TOTAL HIP ARTHROPLASTY Left 10/12/2020  ? Procedure: LEFT TOTAL HIP ARTHROPLASTY ANTERIOR APPROACH;  Surgeon: Leandrew Koyanagi, MD;  Location: [facility];  Service: Orthopedics;  Laterality: Left;  ? TUBAL LIGATION  2006  ? ? ?Family History  ?Problem Relation Age of Onset  ? Schizophrenia Father   ? Schizophrenia Cousin   ? Hyperthyroidism Mother   ? Hypothyroidism Maternal Grandmother   ? ? ?Social History  ? ?Socioeconomic History  ? Marital status: Legally Separated  ?  Spouse name: Not on file  ? Number of children: 2  ? Years of education: Not on file  ? Highest education level: Not on file  ?Occupational History  ? Occupation: disability  ?Tobacco Use  ? Smoking status: Former  ?  Packs/day: 1.00  ?  Years: 15.00  ?  Pack years: 15.00  ?  Types: Cigarettes  ?  Quit date: 10/04/2021  ?  Years since quitting: 0.1  ? Smokeless tobacco: Former  ? Tobacco comments:  ?  use vapor, social  ?Vaping Use  ? Vaping Use: Never used  ?Substance and Sexual Activity  ? Alcohol use: No  ? Drug use: No  ?  Sexual activity: Yes  ?  Birth control/protection: Surgical  ?Other Topics Concern  ? Not on file  ?Social History Narrative  ? Her 2 children live with her  ? ?Social Determinants of Health  ? ?Financial Resource Strain: Medium Risk  ? Difficulty of Paying Living Expenses: Somewhat hard  ?Food Insecurity: Food Insecurity Present  ? Worried About Charity fundraiser in the Last Year: Often true  ? Ran Out of Food in the Last Year: Sometimes true  ?Transportation Needs: Unmet Transportation Needs  ? Lack of Transportation (Medical): Yes  ? Lack of Transportation (Non-Medical): No  ?Physical Activity: Sufficiently Active  ? Days of Exercise per Week: 7 days  ? Minutes of Exercise per Session: 30 min  ?Stress: Stress Concern Present  ? Feeling of Stress : To some extent  ?Social Connections: Socially Isolated  ? Frequency of Communication with Friends and Family: More than three times a week  ? Frequency of Social Gatherings  with Friends and Family: More than three times a week  ? Attends Religious Services: Never  ? Active Member of Clubs or Organizations: No  ? Attends Archivist Meetings: Never  ? Marital Status: Separated  ?Intimate Partner Violence: Not At Risk  ? Fear of Current or Ex-Partner: No  ? Emotionally Abused: No  ? Physically Abused: No  ? Sexually Abused: No  ? ? ?Outpatient Medications Prior to Visit  ?Medication Sig Dispense Refill  ? ALPRAZolam (XANAX) 0.5 MG tablet Take 0.5 mg by mouth 2 (two) times daily as needed for anxiety.    ? ARIPiprazole (ABILIFY) 5 MG tablet Take 1 tablet (5 mg total) by mouth daily. 30 tablet 2  ? atorvastatin (LIPITOR) 20 MG tablet TAKE 1 TABLET(20 MG) BY MOUTH DAILY 100 tablet 3  ? blood glucose meter kit and supplies KIT Dispense based on patient and insurance preference. Check glucose before breakfast. ICD: E11.65 1 each 0  ? diclofenac Sodium (VOLTAREN) 1 % GEL Apply topically.    ? FINACEA 15 % FOAM Apply 1 application topically 2 (two) times daily as needed (rosacea).    ? glucose blood test strip Use as instructed, check glucose before breakfast. ICD: E11.65 100 each 12  ? lamoTRIgine (LAMICTAL) 150 MG tablet Take 150 mg by mouth 2 (two) times daily.    ? Lancets (FREESTYLE) lancets Use as instructed. E11.65 100 each 12  ? levothyroxine (SYNTHROID) 75 MCG tablet TAKE 1 TABLET(75 MCG) BY MOUTH DAILY BEFORE AND BREAKFAST 90 tablet 3  ? lisinopril (ZESTRIL) 10 MG tablet TAKE 1 TABLET(10 MG) BY MOUTH DAILY 90 tablet 3  ? metFORMIN (GLUCOPHAGE) 500 MG tablet TAKE 1 TABLET(500 MG) BY MOUTH TWICE DAILY WITH A MEAL 180 tablet 0  ? methylphenidate (RITALIN) 20 MG tablet Take 20 mg by mouth 2 (two) times daily.    ? pantoprazole (PROTONIX) 40 MG tablet Take 1 tablet by mouth daily.    ? Semaglutide,0.25 or 0.5MG/DOS, 2 MG/1.5ML SOPN Inject 0.5 mg into the skin every Thursday for 28 days. 4.5 mL 1  ? gabapentin (NEURONTIN) 300 MG capsule Take 1 capsule (300 mg total) by mouth 3  (three) times daily. (Patient not taking: Reported on 11/16/2021) 90 capsule 2  ? OVER THE COUNTER MEDICATION Take 2 tablets by mouth every 6 (six) hours as needed (headache). Headache Relief (Patient not taking: Reported on 11/16/2021)    ? clomiPRAMINE (ANAFRANIL) 75 MG capsule Take by mouth. (Patient not taking: Reported on 10/29/2021)    ? ?  No facility-administered medications prior to visit.  ? ? ?Allergies  ?Allergen Reactions  ? Pregabalin Anaphylaxis  ? Celebrex [Celecoxib] Nausea And Vomiting  ?  Stomach pain  ? Effexor [Venlafaxine Hcl] Other (See Comments)  ?  Serotonin toxicity  ? Mirapex [Pramipexole]   ?  Mirapex- Worsening "body jerks"  ? Oxybutynin Other (See Comments)  ?  Urinary retention  ? Pristiq [Desvenlafaxine] Other (See Comments)  ?  Serotonin toxicity   ? Prozac [Fluoxetine Hcl] Swelling  ? Symbyax [Olanzapine-Fluoxetine Hcl] Swelling  ? Wellbutrin [Bupropion Hcl] Swelling  ? ? ?ROS ?Review of Systems  ?Constitutional:  Positive for fatigue. Negative for chills, diaphoresis, fever and unexpected weight change.  ?HENT: Negative.    ?Eyes:  Positive for visual disturbance. Negative for photophobia.  ?Respiratory: Negative.    ?Cardiovascular: Negative.   ?Gastrointestinal:  Positive for constipation.  ?Endocrine: Positive for cold intolerance. Negative for heat intolerance.  ?Genitourinary: Negative.   ?Neurological:  Negative for speech difficulty and weakness.  ? ?  ?Objective:  ?  ?Physical Exam ?Vitals and nursing note reviewed.  ?Constitutional:   ?   General: She is not in acute distress. ?   Appearance: Normal appearance. She is not ill-appearing, toxic-appearing or diaphoretic.  ?HENT:  ?   Head: Normocephalic and atraumatic.  ?   Right Ear: External ear normal.  ?   Left Ear: External ear normal.  ?Eyes:  ?   General: No scleral icterus.    ?   Right eye: No discharge.     ?   Left eye: No discharge.  ?   Extraocular Movements: Extraocular movements intact.  ?   Conjunctiva/sclera:  Conjunctivae normal.  ?Cardiovascular:  ?   Rate and Rhythm: Normal rate and regular rhythm.  ?Pulmonary:  ?   Effort: Pulmonary effort is normal.  ?   Breath sounds: Normal breath sounds.  ?Musculoskeletal

## 2021-11-17 ENCOUNTER — Other Ambulatory Visit: Payer: Self-pay | Admitting: Gastroenterology

## 2021-11-17 DIAGNOSIS — K7400 Hepatic fibrosis, unspecified: Secondary | ICD-10-CM

## 2021-11-18 ENCOUNTER — Other Ambulatory Visit: Payer: Self-pay

## 2021-11-18 ENCOUNTER — Telehealth: Payer: PPO

## 2021-11-18 ENCOUNTER — Other Ambulatory Visit (INDEPENDENT_AMBULATORY_CARE_PROVIDER_SITE_OTHER): Payer: PPO

## 2021-11-18 DIAGNOSIS — E039 Hypothyroidism, unspecified: Secondary | ICD-10-CM | POA: Diagnosis not present

## 2021-11-18 DIAGNOSIS — R5383 Other fatigue: Secondary | ICD-10-CM | POA: Diagnosis not present

## 2021-11-18 LAB — CBC
HCT: 41 % (ref 36.0–46.0)
Hemoglobin: 13.6 g/dL (ref 12.0–15.0)
MCHC: 33.1 g/dL (ref 30.0–36.0)
MCV: 83.7 fl (ref 78.0–100.0)
Platelets: 295 10*3/uL (ref 150.0–400.0)
RBC: 4.89 Mil/uL (ref 3.87–5.11)
RDW: 15.7 % — ABNORMAL HIGH (ref 11.5–15.5)
WBC: 10.9 10*3/uL — ABNORMAL HIGH (ref 4.0–10.5)

## 2021-11-18 LAB — CORTISOL: Cortisol, Plasma: 13.7 ug/dL

## 2021-11-18 LAB — TSH: TSH: 0.84 u[IU]/mL (ref 0.35–5.50)

## 2021-11-18 NOTE — Progress Notes (Signed)
Per the orders ot Dr.Kremer pt is here for labs, pt tolerated draw well.  ?

## 2021-11-18 NOTE — Progress Notes (Deleted)
? ?Chronic Care Management ?Pharmacy Note ? ?11/18/2021 ?Name:  [name] MRN:  [id number] DOB:  April 10, 1971 ? ?Summary: ?*** ? ?Recommendations/Changes made from today's visit: ?*** ? ?Plan: ?*** ? ? ?Subjective: ?[name] is an 51 y.o. year old female who is a primary patient of Libby Maw, MD.  The CCM team was consulted for assistance with disease management and care coordination needs.   ? ?Engaged with patient by telephone for initial visit in response to provider referral for pharmacy case management and/or care coordination services.  ? ?Consent to Services:  ?The patient was given the following information about Chronic Care Management services today, agreed to services, and gave verbal consent: 1. CCM service includes personalized support from designated clinical staff supervised by the primary care provider, including individualized plan of care and coordination with other care providers 2. 24/7 contact phone numbers for assistance for urgent and routine care needs. 3. Service will only be billed when office clinical staff spend 20 minutes or more in a month to coordinate care. 4. Only one practitioner may furnish and bill the service in a calendar month. 5.The patient may stop CCM services at any time (effective at the end of the month) by phone call to the office staff. 6. The patient will be responsible for cost sharing (co-pay) of up to 20% of the service fee (after annual deductible is met). Patient agreed to services and consent obtained. ? ?Patient Care Team: ?Libby Maw, MD as PCP - General (Family Medicine) ?Bjorn Loser, MD as Consulting Physician (Urology) ?[name], MD as Consulting Physician (Gastroenterology) ?Sharyne Peach, MD as Consulting Physician (Ophthalmology) ?Germaine Pomfret, [facility] (Pharmacist) ? ?Recent office visits: ?11/16/2021 Dr. Ethelene Hal MD (PCP)No medication Changes noted, Return in about 6 months ?10/29/2021 Dr. Ethelene Hal MD (PCP)No  medication Changes noted, Return in about 6 months  ?10/13/2021 Maralee Hopkins LPN (PCP Office) Per note patient reported not taking Ciprofloxacin, and Oxybutynin, AMB Referral to [facility] ?06/21/2021 Dr. Ethelene Hal MD (PCP)No Medication Changes noted,  Return in about 6 months  ? ?Recent consult visits: ?11/05/2021 Dr. Erlinda Hong MD (Orthopedic) No medication Changes Noted,  ?Return in about 1 year  ?08/04/2021 Bjorn Loser MD (Urology) Unable to see note ?07/27/2021 Bjorn Loser MD (Urology) Unable to see note ?07/21/2021 Stephannie Peters NP [facility] Medicine) Unable to see note ?07/13/2021 Marygrace Drought MD (Ophthalmology) Unable to see note ?07/05/2021 Dr. Matilde Sprang MD (Urology) star ciprofloxacin HCI 250 mg 2 times daily, Change clomipramine HCI to 75 mg daily ?06/21/2021 Dr. Matilde Sprang MD (Urology) stop mirabegron, start Oxybutynin ER 10 mg daily ?06/03/2021 Stephannie Peters [facility] Medicine) Unable to see note ?05/24/2021 Dr. Matilde Sprang MD (Urology) start mirabegron ER 50 MG daily , Return in about 6 weeks  ? ?Hospital visits: ?None in previous 6 months ? ? ?Objective: ? ?Lab Results  ?Component Value Date  ? CREATININE 0.69 10/29/2021  ? BUN 9 10/29/2021  ? GFR 102.31 10/29/2021  ? GFRNONAA >60 10/13/2020  ? GFRAA >60 12/30/2019  ? NA 139 10/29/2021  ? K 3.9 10/29/2021  ? CALCIUM 9.7 10/29/2021  ? CO2 35 (H) 10/29/2021  ? GLUCOSE 91 10/29/2021  ? ? ?Lab Results  ?Component Value Date/Time  ? HGBA1C 5.5 10/29/2021 11:25 AM  ? HGBA1C 5.4 06/21/2021 11:50 AM  ? GFR 102.31 10/29/2021 11:25 AM  ? GFR 91.16 06/21/2021 11:50 AM  ? MICROALBUR <0.7 12/16/2020 11:37 AM  ?  ?Last diabetic Eye exam:  ?Lab Results  ?Component Value Date/Time  ?  HMDIABEYEEXA No Retinopathy 07/13/2021 12:00 AM  ?  ?Last diabetic Foot exam: No results found for: HMDIABFOOTEX  ? ?Lab Results  ?Component Value Date  ? CHOL 135 10/29/2021  ? HDL 38.30 (L) 10/29/2021  ? [place] 76 10/29/2021  ? LDLDIRECT 93.0 12/16/2020  ? TRIG 103.0  10/29/2021  ? CHOLHDL 4 10/29/2021  ? ? ?Hepatic Function Latest Ref Rng & Units 12/16/2020 10/08/2020 07/07/2020  ?Total Protein 6.0 - 8.3 g/dL 6.6 6.8 6.9  ?Albumin 3.5 - 5.2 g/dL 3.9 4.0 4.1  ?AST 0 - 37 U/L 11 15 14   ?ALT 0 - 35 U/L 17 29 18   ?Alk Phosphatase 39 - 117 U/L 87 87 81  ?Total Bilirubin 0.2 - 1.2 mg/dL 0.3 0.4 0.4  ? ? ?Lab Results  ?Component Value Date/Time  ? TSH 1.09 06/21/2021 11:50 AM  ? TSH 1.78 12/16/2020 11:37 AM  ? FREET4 0.92 04/02/2020 11:14 AM  ? FREET4 1.00 04/16/2015 06:24 AM  ? ? ?CBC Latest Ref Rng & Units 06/21/2021 12/16/2020 10/13/2020  ?WBC 4.0 - 10.5 K/uL 10.8(H) 10.6(H) 16.0(H)  ?Hemoglobin 12.0 - 15.0 g/dL 13.1 13.4 10.1(L)  ?Hematocrit 36.0 - 46.0 % 38.8 40.8 31.3(L)  ?Platelets 150.0 - 400.0 K/uL 274.0 317.0 266  ? ? ?No results found for: VD25OH ? ?Clinical ASCVD: {YES/NO:21197} ?The 10-year ASCVD risk score (Arnett DK, et al., 2019) is: 6.2% ?  Values used to calculate the score: ?    Age: 52 years ?    Sex: Female ?    Is Non-Hispanic African American: No ?    Diabetic: Yes ?    Tobacco smoker: Yes ?    Systolic Blood Pressure: 858 mmHg ?    Is BP treated: Yes ?    HDL Cholesterol: 38.3 mg/dL ?    Total Cholesterol: 135 mg/dL   ? ?Depression screen [facility] 2/9 11/16/2021 10/29/2021 10/13/2021  ?Decreased Interest 0 0 0  ?Down, Depressed, Hopeless 0 0 0  ?PHQ - 2 Score 0 0 0  ?Altered sleeping - - -  ?Tired, decreased energy - - -  ?Change in appetite - - -  ?Feeling bad or failure about yourself  - - -  ?Trouble concentrating - - -  ?Moving slowly or fidgety/restless - - -  ?Suicidal thoughts - - -  ?PHQ-9 Score - - -  ?Difficult doing work/chores - - -  ?Some recent data might be hidden  ?  ? ?***Other: (CHADS2VASc if Afib, MMRC or CAT for COPD, ACT, DEXA) ? ?Social History  ? ?Tobacco Use  ?Smoking Status Former  ? Packs/day: 1.00  ? Years: 15.00  ? Pack years: 15.00  ? Types: Cigarettes  ? Quit date: 10/04/2021  ? Years since quitting: 0.1  ?Smokeless Tobacco Former  ?Tobacco Comments   ? use vapor, social  ? ?BP Readings from Last 3 Encounters:  ?11/16/21 116/67  ?10/29/21 118/64  ?07/05/21 (!) 146/78  ? ?Pulse Readings from Last 3 Encounters:  ?11/16/21 83  ?10/29/21 69  ?07/05/21 (!) 101  ? ?Wt Readings from Last 3 Encounters:  ?11/16/21 183 lb (83 kg)  ?10/29/21 189 lb 9.6 oz (86 kg)  ?10/13/21 192 lb (87.1 kg)  ? ?BMI Readings from Last 3 Encounters:  ?11/16/21 31.41 kg/m?  ?10/29/21 32.54 kg/m?  ?10/13/21 32.96 kg/m?  ? ? ?Assessment/Interventions: Review of patient past medical history, allergies, medications, health status, including review of consultants reports, laboratory and other test data, was performed as part of comprehensive evaluation and provision of  chronic care management services.  ? ?SDOH:  (Social Determinants of Health) assessments and interventions performed: Yes ? ?SDOH Screenings  ? ?Alcohol Screen: Low Risk   ? Last Alcohol Screening Score (AUDIT): 0  ?Depression (PHQ2-9): Low Risk   ? PHQ-2 Score: 0  ?Financial Resource Strain: Medium Risk  ? Difficulty of Paying Living Expenses: Somewhat hard  ?Food Insecurity: Food Insecurity Present  ? Worried About Charity fundraiser in the Last Year: Often true  ? Ran Out of Food in the Last Year: Sometimes true  ?Housing: Low Risk   ? Last Housing Risk Score: 0  ?Physical Activity: Sufficiently Active  ? Days of Exercise per Week: 7 days  ? Minutes of Exercise per Session: 30 min  ?Social Connections: Socially Isolated  ? Frequency of Communication with Friends and Family: More than three times a week  ? Frequency of Social Gatherings with Friends and Family: More than three times a week  ? Attends Religious Services: Never  ? Active Member of Clubs or Organizations: No  ? Attends Archivist Meetings: Never  ? Marital Status: Separated  ?Stress: Stress Concern Present  ? Feeling of Stress : To some extent  ?Tobacco Use: Medium Risk  ? Smoking Tobacco Use: Former  ? Smokeless Tobacco Use: Former  ? Passive Exposure:  Not on file  ?Transportation Needs: Unmet Transportation Needs  ? Lack of Transportation (Medical): Yes  ? Lack of Transportation (Non-Medical): No  ? ? ?CCM Care Plan ? ?Allergies  ?Allergen Reactions

## 2021-11-24 IMAGING — MR MR ABDOMEN WO/W CM MRCP
12 of 20 series · 25 of 48 positions shown · IV contrast (multihance)
Comparison: Right upper quadrant ultrasound dated 02/18/2021

CLINICAL DATA: Obstructed bile duct

EXAM:
MRI ABDOMEN WITHOUT AND WITH CONTRAST (INCLUDING MRCP)
TECHNIQUE: Multiplanar multisequence MR imaging of the abdomen was performed
both before and after the administration of intravenous contrast.
Heavily T2-weighted images of the biliary and pancreatic ducts were
obtained, and three-dimensional MRCP images were rendered by post
processing.
CONTRAST:  20mL MULTIHANCE GADOBENATE DIMEGLUMINE 529 MG/ML IV SOLN

[Series 3: cor haste · coronal · 5.0mm · 0.78mm/px · 2 of 44 slices shown]
[im 1/44]
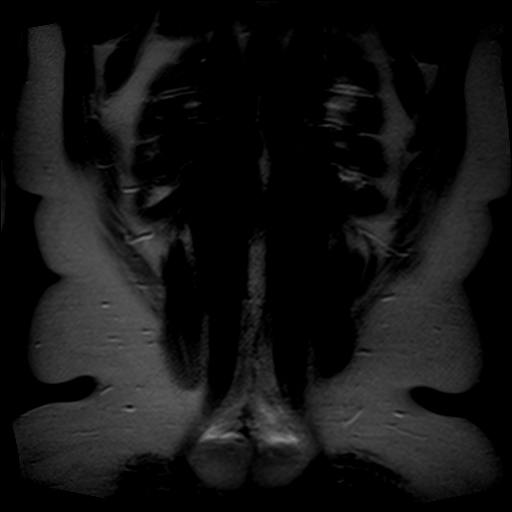
[im 44/44]
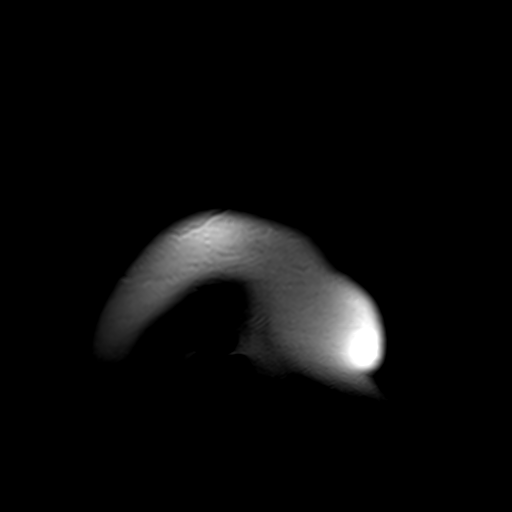

[Series 4: axial haste · axial · 6.0mm · 0.78mm/px · 1 of 42 slices shown]
[im 1/42]
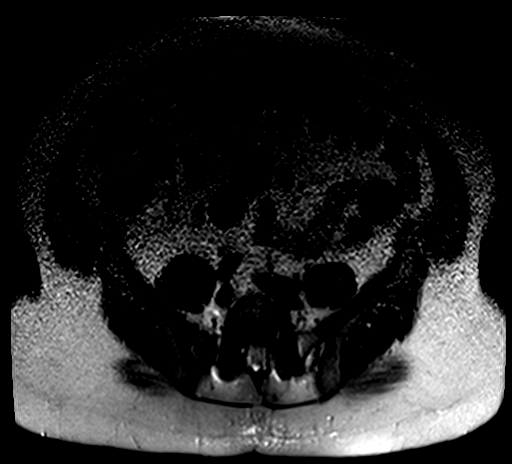

[Series 7: T2 · axial · 6.0mm · 1.25mm/px · 1 of 40 slices shown (1 of 2)]
[im 1/40]
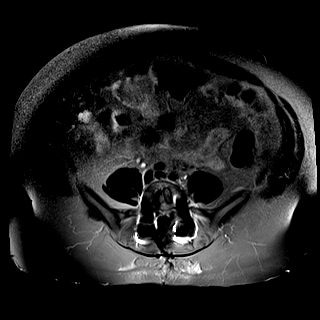

[Series 10: ep2d_[id number]_trig · axial · 6.0mm · 2.08mm/px · z∈[-130,+122]mm · 3 of 108 slices shown]
[im 1/108]
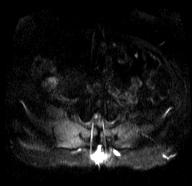
[im 54/108]
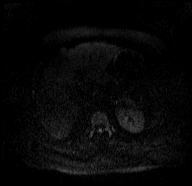
[im 108/108]
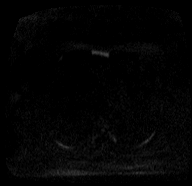

[Series 11: ep2d_[id number]_trig_adc · axial · 6.0mm · 2.08mm/px · 1 of 36 slices shown]
[im 1/36]
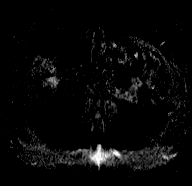

[Series 12: bSSFP · coronal · 5.0mm · 0.78mm/px · 1 of 42 slices shown (1 of 2)]
[im 1/42]
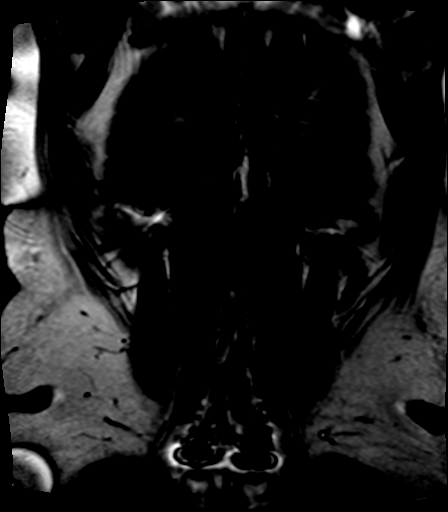

[Series 13: T2 · coronal · 3.0mm · 0.70mm/px · 2 of 60 slices shown (2 of 2)]
[im 1/60]
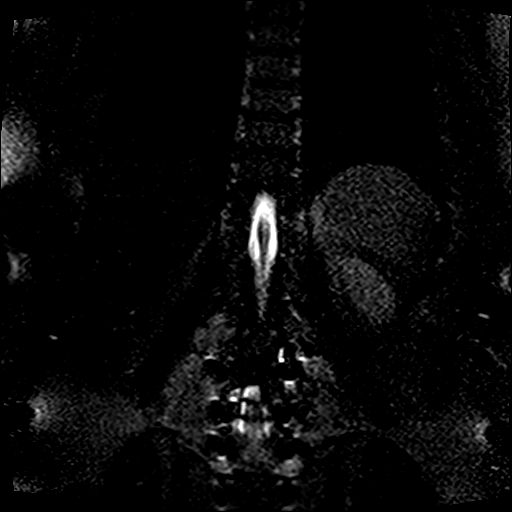
[im 60/60]
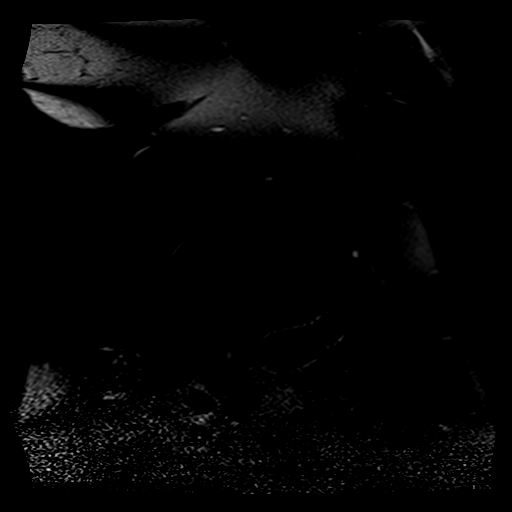

[Series 14: T1 · axial · 6.0mm · 0.78mm/px · z∈[-145,+112]mm · 3 of 80 slices shown]
[im 1/80]
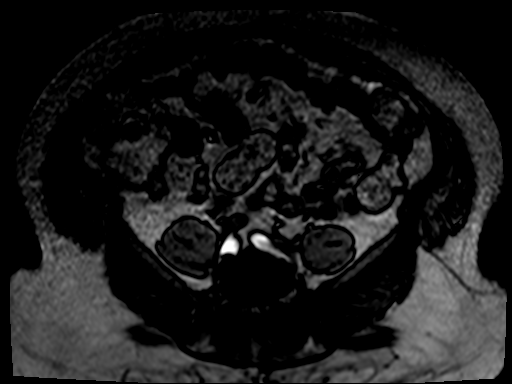
[im 40/80]
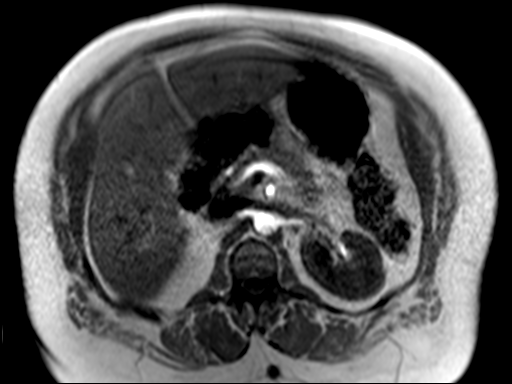
[im 80/80]
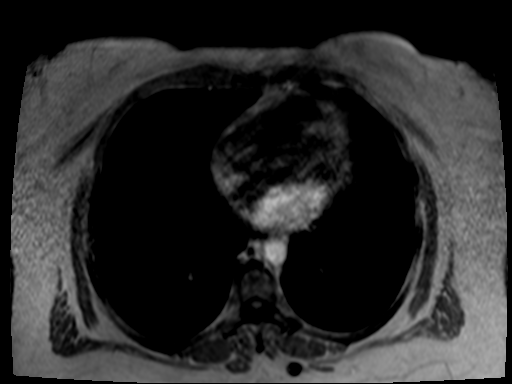

[Series 15: bSSFP · axial · 4.0mm · 0.78mm/px · z∈[-123,+93]mm · 2 of 55 slices shown (2 of 2)]
[im 1/55]
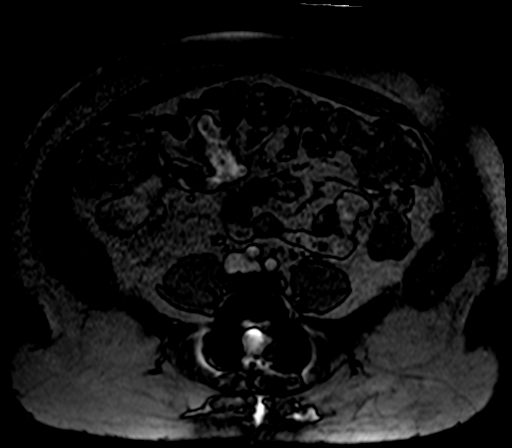
[im 55/55]
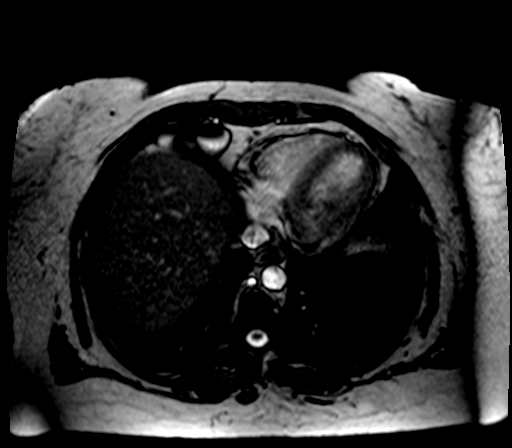

[Series 16: T1 dynamic · axial · non-contrast · 2.5mm · 0.78mm/px · z∈[-134,+103]mm · 3 of 96 slices shown]
[im 1/96]
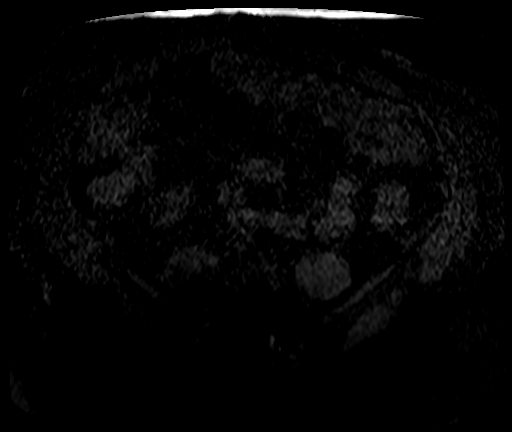
[im 48/96]
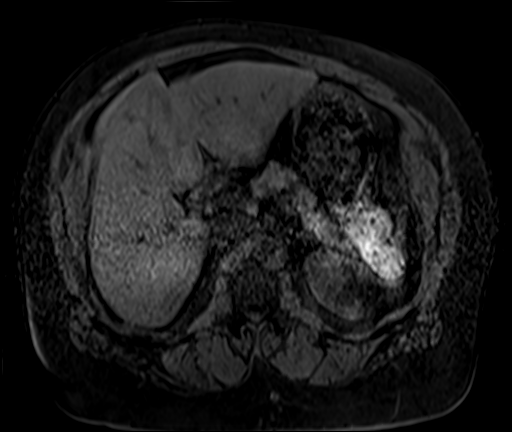
[im 96/96]
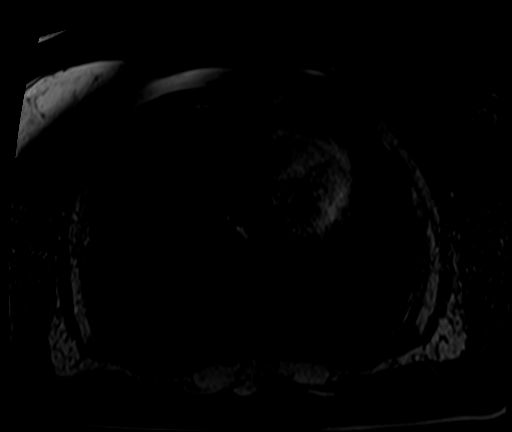

[Series 17: T1 dynamic post-contrast · axial · 2.5mm · 0.78mm/px · z∈[-134,+103]mm · 3 of 96 slices shown (1 of 2)]
[im 1/96]
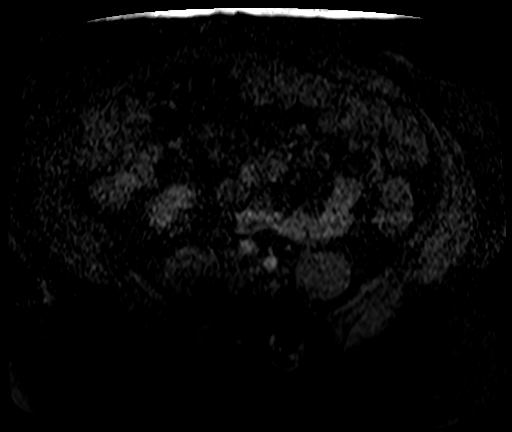
[im 48/96]
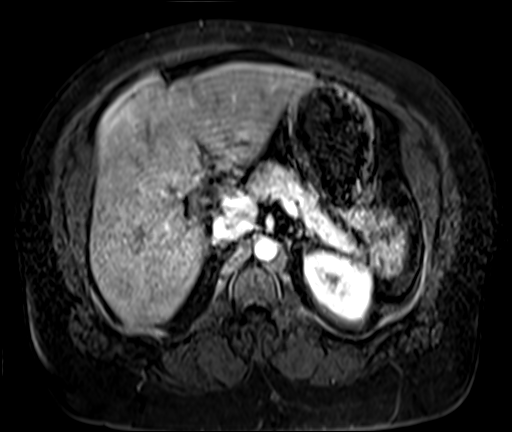
[im 96/96]
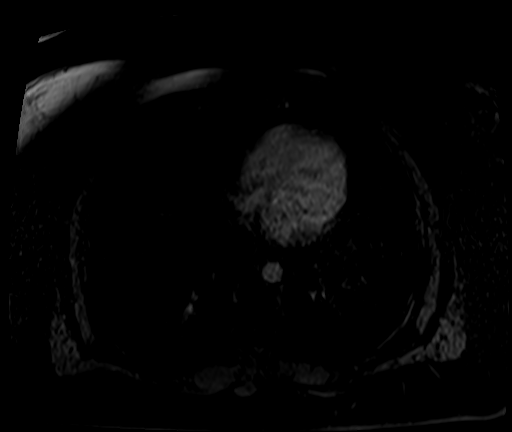

[Series 18: T1 dynamic post-contrast · axial · 2.5mm · 0.78mm/px · z∈[-134,+103]mm · 3 of 96 slices shown (2 of 2)]
[im 1/96]
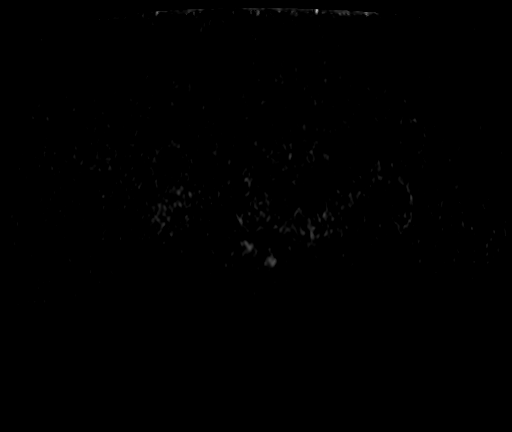
[im 48/96]
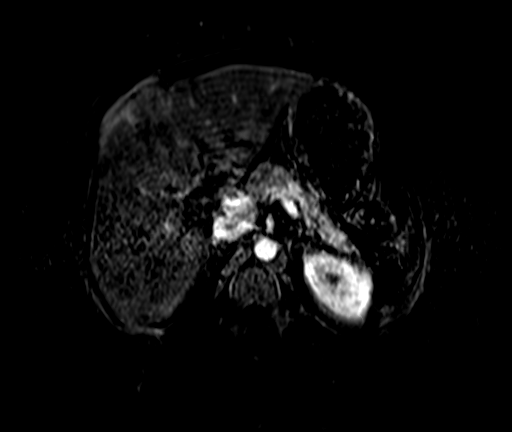
[im 96/96]
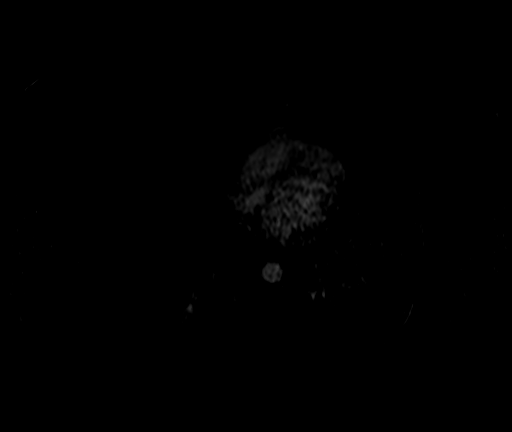

[25 of 48 positions shown; findings below may reference images not displayed]

FINDINGS: Lower chest: Lung bases are clear.

Hepatobiliary: Liver is within normal limits. No morphologic
findings of cirrhosis. No hepatic steatosis.

Contracted gallbladder (series 4/image 25). No cholelithiasis or
inflammatory changes.

No intrahepatic or extrahepatic ductal dilatation. Common duct
measures 6 mm. No choledocholithiasis is seen.

Pancreas:  Within normal limits.

Spleen:  Within normal limits.

Adrenals/Urinary Tract:  Adrenal glands are within normal limits.

Kidneys are within normal limits.  No hydronephrosis.

Stomach/Bowel: Stomach is within normal limits.

Visualized bowel is unremarkable.

Vascular/Lymphatic:  No evidence of abdominal aortic aneurysm.

No suspicious abdominal lymphadenopathy.

Other:  No abdominal ascites.

Musculoskeletal: Lumbar spine fixation hardware with associated
susceptibility artifact.
IMPRESSION: No intrahepatic or extrahepatic ductal dilatation. Common duct
measures 6 mm. No choledocholithiasis is seen.

Contracted gallbladder. No cholelithiasis or inflammatory changes on
MRI.

## 2021-11-25 ENCOUNTER — Other Ambulatory Visit: Payer: PPO

## 2021-11-26 ENCOUNTER — Ambulatory Visit: Payer: PPO

## 2021-12-07 ENCOUNTER — Telehealth: Payer: Self-pay

## 2021-12-07 NOTE — Progress Notes (Signed)
Chronic Care Management APPOINTMENT REMINDER ? ? ?[name] was reminded to have all medications, supplements and any blood glucose and blood pressure readings available for review with Junius Argyle, Pharm. D, at her telephone visit on 12/09/2021 at 11:00 am. ? ?Patient Confirm appointment. ? ?Anderson Malta ?Clinical Pharmacist Assistant ?[phone]  ? ? ? ?

## 2021-12-09 ENCOUNTER — Ambulatory Visit (INDEPENDENT_AMBULATORY_CARE_PROVIDER_SITE_OTHER): Payer: PPO

## 2021-12-09 DIAGNOSIS — F319 Bipolar disorder, unspecified: Secondary | ICD-10-CM

## 2021-12-09 DIAGNOSIS — K219 Gastro-esophageal reflux disease without esophagitis: Secondary | ICD-10-CM

## 2021-12-09 DIAGNOSIS — I10 Essential (primary) hypertension: Secondary | ICD-10-CM

## 2021-12-09 DIAGNOSIS — G8929 Other chronic pain: Secondary | ICD-10-CM

## 2021-12-09 DIAGNOSIS — E039 Hypothyroidism, unspecified: Secondary | ICD-10-CM

## 2021-12-09 DIAGNOSIS — E1165 Type 2 diabetes mellitus with hyperglycemia: Secondary | ICD-10-CM

## 2021-12-09 DIAGNOSIS — G2581 Restless legs syndrome: Secondary | ICD-10-CM

## 2021-12-09 DIAGNOSIS — F411 Generalized anxiety disorder: Secondary | ICD-10-CM

## 2021-12-09 DIAGNOSIS — F332 Major depressive disorder, recurrent severe without psychotic features: Secondary | ICD-10-CM

## 2021-12-09 NOTE — Progress Notes (Signed)
? ?Chronic Care Management ?Pharmacy Note ? ?12/10/2021 ?Name:  [name] MRN:  [id number] DOB:  1971-04-03 ? ?Summary: ?Patient presents for initial CCM Consult. Patient has significant concerns with food scarcity and lack of transportation.  ? ?Recommendations/Changes made from today's visit: ?Continue current medications ?Patient to reach out to insurance to discuss transportation options through her insurance.  ?Reach out to [facility] team for additional help with helping with food scarcity.  ? ?Plan: ?CPP follow-up 6 months  ? ?Subjective: ?[name] is an 51 y.o. year old female who is a primary patient of Libby Maw, MD.  The CCM team was consulted for assistance with disease management and care coordination needs.   ? ?Engaged with patient by telephone for initial visit in response to provider referral for pharmacy case management and/or care coordination services.  ? ?Consent to Services:  ?The patient was given the following information about Chronic Care Management services today, agreed to services, and gave verbal consent: 1. CCM service includes personalized support from designated clinical staff supervised by the primary care provider, including individualized plan of care and coordination with other care providers 2. 24/7 contact phone numbers for assistance for urgent and routine care needs. 3. Service will only be billed when office clinical staff spend 20 minutes or more in a month to coordinate care. 4. Only one practitioner may furnish and bill the service in a calendar month. 5.The patient may stop CCM services at any time (effective at the end of the month) by phone call to the office staff. 6. The patient will be responsible for cost sharing (co-pay) of up to 20% of the service fee (after annual deductible is met). Patient agreed to services and consent obtained. ? ?Patient Care Team: ?Libby Maw, MD as PCP - General (Family Medicine) ?Bjorn Loser, MD as  Consulting Physician (Urology) ?[name], MD as Consulting Physician (Gastroenterology) ?Sharyne Peach, MD as Consulting Physician (Ophthalmology) ?Germaine Pomfret, [facility] (Pharmacist) ? ?Recent office visits: ?11/16/2021 Dr. Ethelene Hal MD (PCP)No medication Changes noted, Return in about 6 months ?10/29/2021 Dr. Ethelene Hal MD (PCP)No medication Changes noted, Return in about 6 months  ?10/13/2021 Aidan Hopkins LPN (PCP Office) Per note patient reported not taking Ciprofloxacin, and Oxybutynin, AMB Referral to [place] ?06/21/2021 Dr. Ethelene Hal MD (PCP)No Medication Changes noted,  Return in about 6 months  ? ?Recent consult visits: ?11/05/2021 Dr. Erlinda Hong MD (Orthopedic) No medication Changes Noted,  ?Return in about 1 year  ?08/04/2021 Bjorn Loser MD (Urology) Unable to see note ?07/27/2021 Bjorn Loser MD (Urology) Unable to see note ?07/21/2021 Stephannie Peters NP [facility] Medicine) Unable to see note ?07/13/2021 Marygrace Drought MD (Ophthalmology) Unable to see note ?07/05/2021 Dr. Matilde Sprang MD (Urology) star ciprofloxacin HCI 250 mg 2 times daily, Change clomipramine HCI to 75 mg daily ?06/21/2021 Dr. Matilde Sprang MD (Urology) stop mirabegron, start Oxybutynin ER 10 mg daily ?06/03/2021 Stephannie Peters [facility] Medicine) Unable to see note ?05/24/2021 Dr. Matilde Sprang MD (Urology) start mirabegron ER 50 MG daily , Return in about 6 weeks  ? ?Hospital visits: ?None in previous 6 months ? ? ?Objective: ? ?Lab Results  ?Component Value Date  ? CREATININE 0.69 10/29/2021  ? BUN 9 10/29/2021  ? GFR 102.31 10/29/2021  ? GFRNONAA >60 10/13/2020  ? GFRAA >60 12/30/2019  ? NA 139 10/29/2021  ? K 3.9 10/29/2021  ? CALCIUM 9.7 10/29/2021  ? CO2 35 (H) 10/29/2021  ? GLUCOSE 91 10/29/2021  ? ? ?Lab Results  ?Component Value  Date/Time  ? HGBA1C 5.5 10/29/2021 11:25 AM  ? HGBA1C 5.4 06/21/2021 11:50 AM  ? GFR 102.31 10/29/2021 11:25 AM  ? GFR 91.16 06/21/2021 11:50 AM  ? MICROALBUR <0.7 12/16/2020 11:37 AM  ?   ?Last diabetic Eye exam:  ?Lab Results  ?Component Value Date/Time  ? HMDIABEYEEXA No Retinopathy 07/13/2021 12:00 AM  ?  ?Last diabetic Foot exam: No results found for: HMDIABFOOTEX  ? ?Lab Results  ?Component Value Date  ? CHOL 135 10/29/2021  ? HDL 38.30 (L) 10/29/2021  ? [place] 76 10/29/2021  ? LDLDIRECT 93.0 12/16/2020  ? TRIG 103.0 10/29/2021  ? CHOLHDL 4 10/29/2021  ? ? ? ?  Latest Ref Rng & Units 12/16/2020  ? 11:37 AM 10/08/2020  ? 12:03 PM 07/07/2020  ?  2:35 PM  ?Hepatic Function  ?Total Protein 6.0 - 8.3 g/dL 6.6   6.8   6.9    ?Albumin 3.5 - 5.2 g/dL 3.9   4.0   4.1    ?AST 0 - 37 U/L 11   15   14     ?ALT 0 - 35 U/L 17   29   18     ?Alk Phosphatase 39 - 117 U/L 87   87   81    ?Total Bilirubin 0.2 - 1.2 mg/dL 0.3   0.4   0.4    ? ? ?Lab Results  ?Component Value Date/Time  ? TSH 0.84 11/18/2021 10:10 AM  ? TSH 1.09 06/21/2021 11:50 AM  ? FREET4 0.92 04/02/2020 11:14 AM  ? FREET4 1.00 04/16/2015 06:24 AM  ? ? ? ?  Latest Ref Rng & Units 11/18/2021  ? 10:10 AM 06/21/2021  ? 11:50 AM 12/16/2020  ? 11:37 AM  ?CBC  ?WBC 4.0 - 10.5 K/uL 10.9   10.8   10.6    ?Hemoglobin 12.0 - 15.0 g/dL 13.6   13.1   13.4    ?Hematocrit 36.0 - 46.0 % 41.0   38.8   40.8    ?Platelets 150.0 - 400.0 K/uL 295.0   274.0   317.0    ? ? ?No results found for: VD25OH ? ?Clinical ASCVD: No  ?The 10-year ASCVD risk score (Arnett DK, et al., 2019) is: 6.2% ?  Values used to calculate the score: ?    Age: 81 years ?    Sex: Female ?    Is Non-Hispanic African American: No ?    Diabetic: Yes ?    Tobacco smoker: Yes ?    Systolic Blood Pressure: 697 mmHg ?    Is BP treated: Yes ?    HDL Cholesterol: 38.3 mg/dL ?    Total Cholesterol: 135 mg/dL   ? ? ?  11/16/2021  ?  4:18 PM 10/29/2021  ? 10:45 AM 10/13/2021  ?  1:04 PM  ?Depression screen PHQ 2/9  ?Decreased Interest 0 0 0  ?Down, Depressed, Hopeless 0 0 0  ?PHQ - 2 Score 0 0 0  ?  ?Social History  ? ?Tobacco Use  ?Smoking Status Former  ? Packs/day: 1.00  ? Years: 15.00  ? Pack years: 15.00  ?  Types: Cigarettes  ? Quit date: 10/04/2021  ? Years since quitting: 0.1  ?Smokeless Tobacco Former  ?Tobacco Comments  ? use vapor, social  ? ?BP Readings from Last 3 Encounters:  ?11/16/21 116/67  ?10/29/21 118/64  ?07/05/21 (!) 146/78  ? ?Pulse Readings from Last 3 Encounters:  ?11/16/21 83  ?10/29/21 69  ?07/05/21 (!) 101  ? ?  Wt Readings from Last 3 Encounters:  ?11/16/21 183 lb (83 kg)  ?10/29/21 189 lb 9.6 oz (86 kg)  ?10/13/21 192 lb (87.1 kg)  ? ?BMI Readings from Last 3 Encounters:  ?11/16/21 31.41 kg/m?  ?10/29/21 32.54 kg/m?  ?10/13/21 32.96 kg/m?  ? ? ?Assessment/Interventions: Review of patient past medical history, allergies, medications, health status, including review of consultants reports, laboratory and other test data, was performed as part of comprehensive evaluation and provision of chronic care management services.  ? ?SDOH:  (Social Determinants of Health) assessments and interventions performed: Yes ?SDOH Interventions   ? ?Flowsheet Row Most Recent Value  ?SDOH Interventions   ?Food Insecurity Interventions Other (Comment)  ?Transportation Interventions Other (Comment)  ? ?  ? ?SDOH Screenings  ? ?Alcohol Screen: Low Risk   ? Last Alcohol Screening Score (AUDIT): 0  ?Depression (PHQ2-9): Low Risk   ? PHQ-2 Score: 0  ?Financial Resource Strain: Medium Risk  ? Difficulty of Paying Living Expenses: Somewhat hard  ?Food Insecurity: Food Insecurity Present  ? Worried About Charity fundraiser in the Last Year: Often true  ? Ran Out of Food in the Last Year: Often true  ?Housing: Low Risk   ? Last Housing Risk Score: 0  ?Physical Activity: Sufficiently Active  ? Days of Exercise per Week: 7 days  ? Minutes of Exercise per Session: 30 min  ?Social Connections: Socially Isolated  ? Frequency of Communication with Friends and Family: More than three times a week  ? Frequency of Social Gatherings with Friends and Family: More than three times a week  ? Attends Religious Services: Never  ? Active Member  of Clubs or Organizations: No  ? Attends Archivist Meetings: Never  ? Marital Status: Separated  ?Stress: Stress Concern Present  ? Feeling of Stress : To some extent  ?Tobacco Use: Medium Risk  ? Sm

## 2021-12-10 MED ORDER — GABAPENTIN 300 MG PO CAPS: 300.0000 mg | ORAL_CAPSULE | Freq: Three times a day (TID) | ORAL | 2 refills | Status: DC

## 2021-12-10 NOTE — Patient Instructions (Signed)
Visit Information ?It was great speaking with you today!  Please let me know if you have any questions about our visit. ? ? Goals Addressed   ? ?  ?  ?  ?  ? This Visit's Progress  ?  Monitor and Manage My Blood Sugar-Diabetes Type 2     ?  Timeframe:  Long-Range Goal ?Priority:  High ?Start Date: 12/10/21                            ?Expected End Date: 12/11/22                     ? ?Follow Up within 90 days  ?  ?- check blood sugar at prescribed times ?- check blood sugar if I feel it is too high or too low ?- enter blood sugar readings and medication or insulin into daily log ?- take the blood sugar log to all doctor visits  ?  ?Why is this important?   ?Checking your blood sugar at home helps to keep it from getting very high or very low.  ?Writing the results in a diary or log helps the doctor know how to care for you.  ?Your blood sugar log should have the time, date and the results.  ?Also, write down the amount of insulin or other medicine that you take.  ?Other information, like what you ate, exercise done and how you were feeling, will also be helpful.   ?  ?Notes:  ?  ? ?  ? ? ?Patient Care Plan: General Pharmacy (Adult)  ?  ? ?Problem Identified: Hypertension, Hyperlipidemia, Diabetes, Hypothyroidism, Depression, Anxiety, Allergic Rhinitis, and Bipolar Disorder   ?Priority: High  ?  ? ?Long-Range Goal: Patient-Specific Goal   ?Start Date: 12/10/2021  ?Expected End Date: 12/11/2022  ?This Visit's Progress: On track  ?Priority: High  ?Note:   ?Current Barriers:  ?No barriers noted ? ?Pharmacist Clinical Goal(s):  ?Patient will maintain control of diabetes as evidenced by A1c less than 7%  through collaboration with PharmD and provider.  ? ?Interventions: ?1:1 collaboration with Libby Maw, MD regarding development and update of comprehensive plan of care as evidenced by provider attestation and co-signature ?Inter-disciplinary care team collaboration (see longitudinal plan of care) ?Comprehensive  medication review performed; medication list updated in electronic medical record ? ?Hypertension (BP goal <130/80) ?-Controlled ?-Current treatment: ?Lisinopril 10 mg daily: Appropriate, Effective, Safe, Accessible ?-Medications previously tried: NA  ?-Denies hypotensive/hypertensive symptoms ?-Recommended to continue current medication ? ?Hyperlipidemia: (LDL goal < 70) ?-Uncontrolled ?-Current treatment: ?Atorvastatin 20 mg daily: Appropriate, Effective, Safe, Accessible  ?-Medications previously tried: NA  ?-Current exercise habits: Minimal  ?-Recommended to continue current medication ? ?Diabetes (A1c goal <7%) ?-Controlled ?-Current medications: ?Metformin 500 mg twice daily with meals: Appropriate, Effective, Safe, Accessible  ?Ozempic 0.5 mg weekly: Appropriate, Effective, Safe, Accessible  ?-Medications previously tried: NA  ?-Current home glucose readings ?Fasting: 101  ?-Denies hypoglycemic/hyperglycemic symptoms ?-Recommended to continue current medication ? ?Depression/Anxiety (Goal: Maintain symptom relief) ?-Controlled ?-Managed by Stephannie Peters, FNP ?-Current treatment: ?Alprazolam 0.5 mg twice daily as needed: Appropriate, Effective, Safe, Accessible  ?Takes 2-3 times daily  ?Aripiprazole 5 mg daily: Appropriate, Effective, Safe, Accessible  ?Methylphenidate 20 mg twice daily: Appropriate, Effective, Safe, Accessible  ?Skips PM dose  ?-Medications previously tried/failed: Buspar, citalopram  ?-Working on establishing with behavioral health at [facility]  ?-Recommended to continue current medication ? ?Hypothyroidism (Goal: Maintain symptom remission) ?-  Controlled ?-Current treatment  ?Levothyroxine 75 mcg daily: Appropriate, Effective, Safe, Accessible  ?-Medications previously tried: NA  ?-Recommended to continue current medication ? ?GERD (Goal: Minimize symptoms) ?-Controlled ?-Current treatment  ?Pantoprazole 40 mg daily: Appropriate, Effective, Safe, Accessible ?-Medications previously tried:  NA  ?-Counseled on trigger avoidance.  ?-Recommended to continue current medication ? ?Patient Goals/Self-Care Activities ?Patient will:  ?- check glucose daily before breakfast, document, and provide at future appointments ? ?Follow Up Plan: Telephone follow up appointment with care management team member scheduled for:  06/16/2022 at 8:30 AM ?  ? ?Ms. Edgerly was given information about Chronic Care Management services today including:  ?CCM service includes personalized support from designated clinical staff supervised by her physician, including individualized plan of care and coordination with other care providers ?24/7 contact phone numbers for assistance for urgent and routine care needs. ?Standard insurance, coinsurance, copays and deductibles apply for chronic care management only during months in which we provide at least 20 minutes of these services. Most insurances cover these services at 100%, however patients may be responsible for any copay, coinsurance and/or deductible if applicable. This service may help you avoid the need for more expensive face-to-face services. ?Only one practitioner may furnish and bill the service in a calendar month. ?The patient may stop CCM services at any time (effective at the end of the month) by phone call to the office staff. ? ?Patient agreed to services and verbal consent obtained.  ? ?Patient verbalizes understanding of instructions and care plan provided today and agrees to view in [place]. Active MyChart status confirmed with patient.   ? ?Junius Argyle, PharmD, BCACP, CPP ?Clinical Pharmacist Practitioner  ?[facility]  ?[phone]  ?

## 2021-12-20 ENCOUNTER — Ambulatory Visit: Payer: PPO | Admitting: Family Medicine

## 2021-12-23 DIAGNOSIS — N3642 Intrinsic sphincter deficiency (ISD): Secondary | ICD-10-CM | POA: Diagnosis not present

## 2021-12-23 DIAGNOSIS — N3946 Mixed incontinence: Secondary | ICD-10-CM | POA: Diagnosis not present

## 2021-12-27 NOTE — Addendum Note (Signed)
Addended by: Daron Offer A on: 12/27/2021 11:01 AM ? ? Modules accepted: Orders ? ?

## 2021-12-29 ENCOUNTER — Telehealth: Payer: Self-pay

## 2021-12-29 NOTE — Telephone Encounter (Signed)
? ?  Telephone encounter was:  Successful.  ?12/29/2021 ?Name: [name] MRN: [id number] DOB: [birth year] ? ?[name] is a 51 y.o. year old female who is a primary care patient of Libby Maw, MD . The community resource team was consulted for assistance with Transportation Needs , Food Insecurity, and Financial Difficulties related to utilities. ? ?Care guide performed the following interventions: Received email from patient confirming receipt of information sent. ? ?Follow Up Plan:  No further follow up planned at this time. The patient has been provided with needed resources. ? ?Amiracle Neises, [place], CHC ?Care Guide  Embedded Care Coordination ?[facility]  ?[address] ?[place], [place] [address] ???[email or web address]  ?? [phone]   ?[email or web address] ?  ?

## 2021-12-29 NOTE — Telephone Encounter (Signed)
? ?  Telephone encounter was:  Successful.  ?12/29/2021 ?Name: [name] MRN: [id number] DOB: 11-Nov-1970 ? ?[name] is a 51 y.o. year old female who is a primary care patient of Libby Maw, MD . The community resource team was consulted for assistance with Transportation Needs , Food Insecurity, and Financial Difficulties related to utilities. ? ?Care guide performed the following interventions: Spoke with patient verified email address [email or web address]. Sent information for food pantries, [employer] transportation, [employer] utility assistance.  Also mailed information and applications. Letter saved in Epic. ? ?Follow Up Plan:  Care guide will follow up with patient by phone over the next 7 days. ? ?Iness Pangilinan, [place], CHC ?Care Guide  Embedded Care Coordination ?[facility]  ?[address] ?[place], [place] [address] ?[email or web address]  ?? [phone]   ?[email or web address] ?  ?

## 2022-01-02 DIAGNOSIS — F32A Depression, unspecified: Secondary | ICD-10-CM

## 2022-01-02 DIAGNOSIS — E785 Hyperlipidemia, unspecified: Secondary | ICD-10-CM | POA: Diagnosis not present

## 2022-01-02 DIAGNOSIS — E039 Hypothyroidism, unspecified: Secondary | ICD-10-CM | POA: Diagnosis not present

## 2022-01-02 DIAGNOSIS — I10 Essential (primary) hypertension: Secondary | ICD-10-CM | POA: Diagnosis not present

## 2022-01-02 DIAGNOSIS — E1159 Type 2 diabetes mellitus with other circulatory complications: Secondary | ICD-10-CM

## 2022-01-02 DIAGNOSIS — Z7985 Long-term (current) use of injectable non-insulin antidiabetic drugs: Secondary | ICD-10-CM | POA: Diagnosis not present

## 2022-01-02 DIAGNOSIS — Z7984 Long term (current) use of oral hypoglycemic drugs: Secondary | ICD-10-CM | POA: Diagnosis not present

## 2022-01-18 DIAGNOSIS — F909 Attention-deficit hyperactivity disorder, unspecified type: Secondary | ICD-10-CM | POA: Diagnosis not present

## 2022-01-18 DIAGNOSIS — F3132 Bipolar disorder, current episode depressed, moderate: Secondary | ICD-10-CM | POA: Diagnosis not present

## 2022-01-18 DIAGNOSIS — F411 Generalized anxiety disorder: Secondary | ICD-10-CM | POA: Diagnosis not present

## 2022-01-18 DIAGNOSIS — F429 Obsessive-compulsive disorder, unspecified: Secondary | ICD-10-CM | POA: Diagnosis not present

## 2022-02-07 ENCOUNTER — Telehealth: Payer: Self-pay | Admitting: Family Medicine

## 2022-02-07 ENCOUNTER — Other Ambulatory Visit: Payer: Self-pay | Admitting: Family Medicine

## 2022-02-07 DIAGNOSIS — E1165 Type 2 diabetes mellitus with hyperglycemia: Secondary | ICD-10-CM

## 2022-02-18 ENCOUNTER — Ambulatory Visit: Payer: PPO

## 2022-02-25 ENCOUNTER — Other Ambulatory Visit: Payer: Self-pay | Admitting: Family Medicine

## 2022-02-25 DIAGNOSIS — E039 Hypothyroidism, unspecified: Secondary | ICD-10-CM

## 2022-03-22 ENCOUNTER — Ambulatory Visit: Payer: PPO | Admitting: Advanced Practice Midwife

## 2022-03-30 ENCOUNTER — Encounter: Payer: Self-pay | Admitting: Family Medicine

## 2022-03-30 ENCOUNTER — Ambulatory Visit (INDEPENDENT_AMBULATORY_CARE_PROVIDER_SITE_OTHER): Payer: PPO | Admitting: Family Medicine

## 2022-03-30 VITALS — BP 122/78 | HR 75 | Temp 98.4°F | Wt 178.0 lb

## 2022-03-30 DIAGNOSIS — L0291 Cutaneous abscess, unspecified: Secondary | ICD-10-CM | POA: Diagnosis not present

## 2022-03-30 MED ORDER — DOXYCYCLINE HYCLATE 100 MG PO TABS: 100.0000 mg | ORAL_TABLET | Freq: Two times a day (BID) | ORAL | 0 refills | Status: AC

## 2022-03-30 NOTE — Assessment & Plan Note (Signed)
Located on left vulva Differential includes left abscess versus Bartholin gland cyst Discussed need for I&D especially since patient is febrile and diabetic, concern for possible sepsis and death Patient declined Trial doxycycline 100 mg twice daily x7 days Close follow-up recommended, ED precautions discussed including worsening fever pain

## 2022-03-30 NOTE — Progress Notes (Signed)
Assessment/Plan:   Problem List Items Addressed This Visit       Other   Abscess - Primary    Located on left vulva Differential includes left abscess versus Bartholin gland cyst Discussed need for I&D especially since patient is febrile and diabetic, concern for possible sepsis and death Patient declined Trial doxycycline 100 mg twice daily x7 days Close follow-up recommended, ED precautions discussed including worsening fever pain      Relevant Medications   doxycycline (VIBRA-TABS) 100 MG tablet       Subjective:  HPI:  [name] is a 51 y.o. female who has Chronic LBP; Adult hypothyroidism; Severe recurrent major depression w/psychotic features, mood-congruent ([place]); Anxiety disorder; Major depressive disorder, recurrent severe without psychotic features ([place]); Suicidal ideations; GAD (generalized anxiety disorder); Bipolar depression ([place]); Recurrent major depression-severe ([place]); Elevated cholesterol; Essential hypertension; Allergic rhinitis due to pollen; Reactive airway disease; Primary osteoarthritis of left hip; Morbid obesity ([place]); Acute frontal sinusitis; Obstructive sleep apnea syndrome; Post-nasal drip; Allergic cough; Sciatica; Class 3 severe obesity due to excess calories with serious comorbidity and body mass index (BMI) of 45.0 to 49.9 in adult [facility]); Left hip pain; Otitis externa of left ear; Cholesteatoma of left ear; Dysfunction of left eustachian tube; Left ear pain; COVID-19; Bladder problem; Breast pain, right; Chronic nonintractable headache; B12 deficiency; Depression; Dysuria; Elevated liver enzymes; Other fatigue; Hepatitis C infection; Hospital discharge follow-up; Insomnia; Chronic arthralgias of knees and hips; Leukocytosis; Localized edema; Prediabetes; Refused influenza vaccine; RLS (restless legs syndrome); Vitamin D deficiency; Type 2 diabetes mellitus with hyperglycemia, without long-term current use of insulin ([place]); Peripheral edema; Status  post total replacement of left hip; Hematuria; Lower respiratory infection; Periorbital hyperpigmentation; Abnormal CBC; Stress; Need for influenza vaccination; Abnormal findings on diagnostic imaging of liver and biliary tract; Colon cancer screening; Constipation; Gallstones, common bile duct; Gastroesophageal reflux disease; History of hepatitis C; Intestinal gas excretion; Liver fibrosis; Right upper quadrant pain; Amenorrhea; and Abscess on their problem list..   She  has a past medical history of ADHD (attention deficit hyperactivity disorder), Anemia, Anxiety, Arthritis, Bipolar disorder (HCC), Chronic back pain greater than 3 months duration, Depression, Diabetes mellitus without complication ([place]), Diabetic nephropathy ([place]), Dysuria, Eroded bladder suspension mesh ([place]), GERD (gastroesophageal reflux disease), Hepatitis, History of COVID-19 (11/2019), History of MRSA infection, History of sleep apnea, Hypertension, Hypothyroidism, Leg pain, bilateral, Obesity, Obsessive-compulsive disorder, Pneumonia (12/24/2019), Rosacea, Sciatic nerve pain, Social anxiety disorder, and Squamous cell carcinoma of back (2021)..   She presents with chief complaint of Bump of concern (Temperature of 100.3. Patient states  painful boil in vaginal area x 3-4 days. Rx request on gabapentin) .   Abscess: Patient presents for evaluation of a cutaneous abscess. Lesion is located in the left vulva. Onset was 3 days ago. Symptoms have progressed to a point and plateaued. Abscess has associated symptoms of spontaneous drainage, pain, fever. Patient does have previous history of cutaneous abscesses. Patient does have diabetes.   Past Surgical History:  Procedure Laterality Date   BACK SURGERY     total of 6 back surgeries   bladder mesh  2009   DILITATION & CURRETTAGE/HYSTROSCOPY WITH NOVASURE ABLATION N/A 08/19/2016   Procedure: DILATATION & CURETTAGE WITH NOVASURE ABLATION;  Surgeon: [name], MD;  Location: [facility];  Service: Gynecology;  Laterality: N/A;   EUS N/A 09/09/2015   Procedure: UPPER ENDOSCOPIC ULTRASOUND (EUS) RADIAL;  Surgeon: [name], MD;  Location: [facility];  Service: Endoscopy;  Laterality: N/A;  EYE SURGERY     INCONTINENCE SURGERY     SPINAL CORD STIMULATOR INSERTION     SPINAL CORD STIMULATOR REMOVAL     SPINAL FUSION  2008   times two   TOTAL HIP ARTHROPLASTY Left 10/12/2020   Procedure: LEFT TOTAL HIP ARTHROPLASTY ANTERIOR APPROACH;  Surgeon: Leandrew Koyanagi, MD;  Location: [facility];  Service: Orthopedics;  Laterality: Left;   TUBAL LIGATION  2006    Outpatient Medications Prior to Visit  Medication Sig Dispense Refill   ALPRAZolam (XANAX) 0.5 MG tablet Take 0.5 mg by mouth 3 (three) times daily as needed for anxiety.     ARIPiprazole (ABILIFY) 5 MG tablet Take 1 tablet (5 mg total) by mouth daily. 30 tablet 2   aspirin-acetaminophen-caffeine (EXCEDRIN MIGRAINE) 003-704-88 MG tablet Take 1 tablet by mouth every 6 (six) hours as needed for migraine.     atorvastatin (LIPITOR) 20 MG tablet TAKE 1 TABLET(20 MG) BY MOUTH DAILY 100 tablet 3   blood glucose meter kit and supplies KIT Dispense based on patient and insurance preference. Check glucose before breakfast. ICD: E11.65 1 each 0   diclofenac Sodium (VOLTAREN) 1 % GEL Apply 2 g topically daily as needed.     gabapentin (NEURONTIN) 300 MG capsule Take 1 capsule (300 mg total) by mouth 3 (three) times daily. 90 capsule 2   glucose blood test strip Use as instructed, check glucose before breakfast. ICD: E11.65 100 each 12   lamoTRIgine (LAMICTAL) 150 MG tablet Take 300 mg by mouth daily.     Lancets (FREESTYLE) lancets Use as instructed. E11.65 100 each 12   levothyroxine (SYNTHROID) 75 MCG tablet TAKE 1 TABLET(75 MCG) BY MOUTH DAILY BEFORE BREAKFAST 90 tablet 3   lisinopril (ZESTRIL) 10 MG tablet TAKE 1 TABLET(10 MG) BY MOUTH DAILY 90 tablet 3   metFORMIN (GLUCOPHAGE) 500 MG tablet TAKE 1 TABLET(500 MG) BY MOUTH TWICE  DAILY WITH A MEAL 180 tablet 0   methylphenidate (RITALIN) 20 MG tablet Take 20 mg by mouth 2 (two) times daily.     OZEMPIC, 0.25 OR 0.5 MG/DOSE, 2 MG/3ML SOPN Inject 0.25 mg into the skin once a week.     pantoprazole (PROTONIX) 40 MG tablet Take 1 tablet by mouth daily.     FINACEA 15 % FOAM Apply 1 application topically 2 (two) times daily as needed (rosacea). (Patient not taking: Reported on 12/09/2021)     No facility-administered medications prior to visit.    Family History  Problem Relation Age of Onset   Schizophrenia Father    Schizophrenia Cousin    Hyperthyroidism Mother    Hypothyroidism Maternal Grandmother     Social History   Socioeconomic History   Marital status: Legally Separated    Spouse name: Not on file   Number of children: 2   Years of education: Not on file   Highest education level: Not on file  Occupational History   Occupation: disability  Tobacco Use   Smoking status: Former    Packs/day: 1.00    Years: 15.00    Total pack years: 15.00    Types: Cigarettes    Quit date: 10/04/2021    Years since quitting: 0.4   Smokeless tobacco: Former   Tobacco comments:    use vapor, social  Vaping Use   Vaping Use: Never used  Substance and Sexual Activity   Alcohol use: No   Drug use: No   Sexual activity: Yes    Birth control/protection: Surgical  Other Topics Concern   Not on file  Social History Narrative   Her 2 children live with her   Social Determinants of Health   Financial Resource Strain: Medium Risk (10/13/2021)   Overall Financial Resource Strain (CARDIA)    Difficulty of Paying Living Expenses: Somewhat hard  Food Insecurity: Food Insecurity Present (12/29/2021)   Hunger Vital Sign    Worried About Running Out of Food in the Last Year: Often true    Ran Out of Food in the Last Year: Often true  Transportation Needs: Unmet Transportation Needs (12/29/2021)   PRAPARE - Hydrologist (Medical): Yes    Lack  of Transportation (Non-Medical): Yes  Physical Activity: Sufficiently Active (10/13/2021)   Exercise Vital Sign    Days of Exercise per Week: 7 days    Minutes of Exercise per Session: 30 min  Stress: Stress Concern Present (10/13/2021)   [place]    Feeling of Stress : To some extent  Social Connections: Socially Isolated (10/13/2021)   Social Connection and Isolation Panel [NHANES]    Frequency of Communication with Friends and Family: More than three times a week    Frequency of Social Gatherings with Friends and Family: More than three times a week    Attends Religious Services: Never    Marine scientist or Organizations: No    Attends Archivist Meetings: Never    Marital Status: Separated  Intimate Partner Violence: Not At Risk (10/13/2021)   Humiliation, Afraid, Rape, and Kick questionnaire    Fear of Current or Ex-Partner: No    Emotionally Abused: No    Physically Abused: No    Sexually Abused: No                                                                                                 Objective:  Physical Exam: BP 122/78 (BP Location: Left Arm, Patient Position: Sitting, Cuff Size: Large)   Pulse 75   Temp 98.4 F (36.9 C) (Oral)   Wt 178 lb (80.7 kg)   LMP 02/16/2022   SpO2 98%   BMI 30.55 kg/m    Chaperoned by Ane Payment General: No acute distress. Awake and conversant.  Eyes: Normal conjunctiva, anicteric. Round symmetric pupils.  ENT: Hearing grossly intact. No nasal discharge.  Neck: Neck is supple. No masses or thyromegaly.  Respiratory: Respirations are non-labored. No auditory wheezing.  Skin: Warm. No rashes or ulcers.  Psych: Alert and oriented. Cooperative, Appropriate mood and affect, Normal judgment.  CV: No cyanosis or JVD MSK: Normal ambulation. No clubbing  Neuro: Sensation and CN II-XII grossly normal.  General: There is a tender nodule at the base of left  vulva, approximately 1 cm       Alesia Banda, MD, MS

## 2022-04-16 DIAGNOSIS — R42 Dizziness and giddiness: Secondary | ICD-10-CM | POA: Diagnosis not present

## 2022-04-16 DIAGNOSIS — I10 Essential (primary) hypertension: Secondary | ICD-10-CM | POA: Diagnosis not present

## 2022-04-16 DIAGNOSIS — G4489 Other headache syndrome: Secondary | ICD-10-CM | POA: Diagnosis not present

## 2022-04-16 DIAGNOSIS — R457 State of emotional shock and stress, unspecified: Secondary | ICD-10-CM | POA: Diagnosis not present

## 2022-04-18 ENCOUNTER — Emergency Department (HOSPITAL_BASED_OUTPATIENT_CLINIC_OR_DEPARTMENT_OTHER): Payer: PPO

## 2022-04-18 ENCOUNTER — Telehealth: Payer: Self-pay | Admitting: Family Medicine

## 2022-04-18 ENCOUNTER — Emergency Department (HOSPITAL_BASED_OUTPATIENT_CLINIC_OR_DEPARTMENT_OTHER)
Admission: EM | Admit: 2022-04-18 | Discharge: 2022-04-18 | Disposition: A | Discharge status: Home / Self Care | Payer: PPO | Attending: Emergency Medicine | Admitting: Emergency Medicine

## 2022-04-18 ENCOUNTER — Other Ambulatory Visit: Payer: Self-pay

## 2022-04-18 ENCOUNTER — Encounter (HOSPITAL_BASED_OUTPATIENT_CLINIC_OR_DEPARTMENT_OTHER): Payer: Self-pay | Admitting: Pediatrics

## 2022-04-18 DIAGNOSIS — Z79899 Other long term (current) drug therapy: Secondary | ICD-10-CM | POA: Diagnosis not present

## 2022-04-18 DIAGNOSIS — R0602 Shortness of breath: Secondary | ICD-10-CM | POA: Diagnosis not present

## 2022-04-18 DIAGNOSIS — R079 Chest pain, unspecified: Secondary | ICD-10-CM | POA: Insufficient documentation

## 2022-04-18 DIAGNOSIS — Z7982 Long term (current) use of aspirin: Secondary | ICD-10-CM | POA: Insufficient documentation

## 2022-04-18 DIAGNOSIS — Z20822 Contact with and (suspected) exposure to covid-19: Secondary | ICD-10-CM | POA: Insufficient documentation

## 2022-04-18 DIAGNOSIS — I10 Essential (primary) hypertension: Secondary | ICD-10-CM | POA: Insufficient documentation

## 2022-04-18 DIAGNOSIS — R0789 Other chest pain: Secondary | ICD-10-CM | POA: Diagnosis not present

## 2022-04-18 DIAGNOSIS — R42 Dizziness and giddiness: Secondary | ICD-10-CM | POA: Insufficient documentation

## 2022-04-18 DIAGNOSIS — R519 Headache, unspecified: Secondary | ICD-10-CM | POA: Diagnosis not present

## 2022-04-18 LAB — URINALYSIS, ROUTINE W REFLEX MICROSCOPIC
Bilirubin Urine: NEGATIVE
Glucose, UA: NEGATIVE mg/dL
Ketones, ur: NEGATIVE mg/dL
Leukocytes,Ua: NEGATIVE
Nitrite: NEGATIVE
Protein, ur: NEGATIVE mg/dL
Specific Gravity, Urine: 1.015 (ref 1.005–1.030)
pH: 5.5 (ref 5.0–8.0)

## 2022-04-18 LAB — SARS CORONAVIRUS 2 BY RT PCR: SARS Coronavirus 2 by RT PCR: NEGATIVE

## 2022-04-18 LAB — CBC
HCT: 40 % (ref 36.0–46.0)
Hemoglobin: 13.1 g/dL (ref 12.0–15.0)
MCH: 27.2 pg (ref 26.0–34.0)
MCHC: 32.8 g/dL (ref 30.0–36.0)
MCV: 83.2 fL (ref 80.0–100.0)
Platelets: 340 10*3/uL (ref 150–400)
RBC: 4.81 MIL/uL (ref 3.87–5.11)
RDW: 15.3 % (ref 11.5–15.5)
WBC: 9 10*3/uL (ref 4.0–10.5)
nRBC: 0 % (ref 0.0–0.2)

## 2022-04-18 LAB — URINALYSIS, MICROSCOPIC (REFLEX): WBC, UA: NONE SEEN WBC/hpf (ref 0–5)

## 2022-04-18 LAB — PREGNANCY, URINE: Preg Test, Ur: NEGATIVE

## 2022-04-18 LAB — BASIC METABOLIC PANEL
Anion gap: 10 (ref 5–15)
BUN: 13 mg/dL (ref 6–20)
CO2: 22 mmol/L (ref 22–32)
Calcium: 9.5 mg/dL (ref 8.9–10.3)
Chloride: 106 mmol/L (ref 98–111)
Creatinine, Ser: 0.66 mg/dL (ref 0.44–1.00)
GFR, Estimated: >60 mL/min (ref >60)
Glucose, Bld: 92 mg/dL (ref 70–99)
Potassium: 3.9 mmol/L (ref 3.5–5.1)
Sodium: 138 mmol/L (ref 135–145)

## 2022-04-18 LAB — TROPONIN I (HIGH SENSITIVITY)
Troponin I (High Sensitivity): 3 ng/L (ref <18)
Troponin I (High Sensitivity): 4 ng/L (ref <18)

## 2022-04-18 LAB — GLUCOSE, CAPILLARY: Glucose-Capillary: 95 mg/dL (ref 70–99)

## 2022-04-18 LAB — D-DIMER, QUANTITATIVE: D-Dimer, Quant: <0.27 ug/mL-FEU (ref 0.00–0.50)

## 2022-04-18 NOTE — Telephone Encounter (Signed)
Caller Name: Alyra Patty Call back phone #: [phone]  Reason for Call: Pt called concerned about her bp, 148/104. Transferred pt to triage

## 2022-04-18 NOTE — ED Notes (Signed)
Pt transported to CT ?

## 2022-04-18 NOTE — ED Triage Notes (Signed)
C/O chest pain, shortness of breathe, dizziness and high blood pressure since Friday; reported taking Lisinopril 10 mg; c/o increased swelling on her feet and increased urination as well.

## 2022-04-18 NOTE — ED Provider Notes (Signed)
[facility] EMERGENCY DEPARTMENT Provider Note   CSN: [id number] Arrival date & time: 04/18/22  1423     History  Chief Complaint  Patient presents with   Chest Pain   Shortness of Breath   Dizziness   Hypertension    [name] is a 51 y.o. female.   Chest Pain Associated symptoms: dizziness and shortness of breath   Shortness of Breath Associated symptoms: chest pain   Dizziness Associated symptoms: chest pain and shortness of breath   Hypertension Associated symptoms include chest pain and shortness of breath.     Patient presents due to headache, chest pain, shortness of breath, dizziness and hypertension.  Patient symptoms started 3 days ago on Friday, started with a headache.  This significantly distribution across her head, no associated nausea and vomiting.  She also has been having centralized chest pain which comes and goes and radiates to her shoulder.  She feels short of breath, denies any mopped assist or coughing.  She also has increased urinary frequency without any dysuria or abdominal pain.  No medicine prior to arrival, no history of ACS, no hemoptysis, recent surgery or travel, history of ACS or PE.  Patient takes 10 mg lisinopril daily no missed doses.  Home Medications Prior to Admission medications   Medication Sig Start Date End Date Taking? Authorizing Provider  ALPRAZolam Duanne Moron) 0.5 MG tablet Take 0.5 mg by mouth 3 (three) times daily as needed for anxiety. 12/20/19   [provider]  ARIPiprazole (ABILIFY) 5 MG tablet Take 1 tablet (5 mg total) by mouth daily. 09/17/15   Clarene Reamer, MD  aspirin-acetaminophen-caffeine (EXCEDRIN MIGRAINE) [phone] MG tablet Take 1 tablet by mouth every 6 (six) hours as needed for migraine.    [provider]  atorvastatin (LIPITOR) 20 MG tablet TAKE 1 TABLET(20 MG) BY MOUTH DAILY 08/27/21   Libby Maw, MD  blood glucose meter kit and supplies KIT Dispense based on  patient and insurance preference. Check glucose before breakfast. ICD: E11.65 04/02/20   Nche, Charlene Brooke, NP  diclofenac Sodium (VOLTAREN) 1 % GEL Apply 2 g topically daily as needed. 02/18/21   [provider]  FINACEA 15 % FOAM Apply 1 application topically 2 (two) times daily as needed (rosacea). Patient not taking: Reported on 12/09/2021 02/14/20   [provider]  gabapentin (NEURONTIN) 300 MG capsule Take 1 capsule (300 mg total) by mouth 3 (three) times daily. 12/10/21   Libby Maw, MD  glucose blood test strip Use as instructed, check glucose before breakfast. ICD: E11.65 07/21/20   Libby Maw, MD  lamoTRIgine (LAMICTAL) 150 MG tablet Take 300 mg by mouth daily. 02/22/20   [provider]  Lancets (FREESTYLE) lancets Use as instructed. E11.65 07/21/20   Libby Maw, MD  levothyroxine (SYNTHROID) 75 MCG tablet TAKE 1 TABLET(75 MCG) BY MOUTH DAILY BEFORE BREAKFAST 02/25/22   Libby Maw, MD  lisinopril (ZESTRIL) 10 MG tablet TAKE 1 TABLET(10 MG) BY MOUTH DAILY 10/07/21   Libby Maw, MD  metFORMIN (GLUCOPHAGE) 500 MG tablet TAKE 1 TABLET(500 MG) BY MOUTH TWICE DAILY WITH A MEAL 02/07/22   Libby Maw, MD  methylphenidate (RITALIN) 20 MG tablet Take 20 mg by mouth 2 (two) times daily. 09/22/20   [provider]  OZEMPIC, 0.25 OR 0.5 MG/DOSE, 2 MG/3ML SOPN Inject 0.25 mg into the skin once a week. 11/23/21   [provider]  pantoprazole (PROTONIX) 40 MG tablet Take  1 tablet by mouth daily. 01/06/21   [provider]      Allergies    Pregabalin, Celebrex [celecoxib], Effexor [venlafaxine hcl], Mirapex [pramipexole], Oxybutynin, Pristiq [desvenlafaxine], Prozac [fluoxetine hcl], Symbyax [olanzapine-fluoxetine hcl], and Wellbutrin [bupropion hcl]    Review of Systems   Review of Systems  Respiratory:  Positive for shortness of breath.   Cardiovascular:  Positive for chest pain.   Neurological:  Positive for dizziness.    Physical Exam Updated Vital Signs BP (!) 144/85   Pulse 64   Temp 98.7 F (37.1 C) (Oral)   Resp 18   Ht _0  (1.626 m)   Wt 74.4 kg   LMP 02/16/2022   SpO2 98%   BMI 28.15 kg/m  Physical Exam Vitals and nursing note reviewed. Exam conducted with a chaperone present.  Constitutional:      Appearance: Normal appearance.  HENT:     Head: Normocephalic and atraumatic.  Eyes:     General: No scleral icterus.       Right eye: No discharge.        Left eye: No discharge.     Extraocular Movements: Extraocular movements intact.     Pupils: Pupils are equal, round, and reactive to light.  Cardiovascular:     Rate and Rhythm: Normal rate and regular rhythm.     Pulses: Normal pulses.     Heart sounds: Normal heart sounds. No murmur heard.    No friction rub. No gallop.  Pulmonary:     Effort: Pulmonary effort is normal. No respiratory distress.     Breath sounds: Normal breath sounds.  Chest:     Chest wall: No tenderness.  Abdominal:     General: Abdomen is flat. Bowel sounds are normal. There is no distension.     Palpations: Abdomen is soft.     Tenderness: There is no abdominal tenderness.  Musculoskeletal:     Right lower leg: No edema.     Left lower leg: No edema.  Skin:    General: Skin is warm and dry.     Coloration: Skin is not jaundiced.  Neurological:     Mental Status: She is alert. Mental status is at baseline.     Coordination: Coordination normal.     Comments: Cranial nerves II through XII are grossly intact, grips equal bilaterally.  Lower extremity strength is equal bilaterally.     ED Results / Procedures / Treatments   Labs (all labs ordered are listed, but only abnormal results are displayed) Labs Reviewed  URINALYSIS, ROUTINE W REFLEX MICROSCOPIC - Abnormal; Notable for the following components:      Result Value   Hgb urine dipstick TRACE (*)    All other components within normal limits   URINALYSIS, MICROSCOPIC (REFLEX) - Abnormal; Notable for the following components:   Bacteria, UA RARE (*)    All other components within normal limits  SARS CORONAVIRUS 2 BY RT PCR  BASIC METABOLIC PANEL  CBC  PREGNANCY, URINE  GLUCOSE, CAPILLARY  D-DIMER, QUANTITATIVE  TROPONIN I (HIGH SENSITIVITY)  TROPONIN I (HIGH SENSITIVITY)    EKG None  Radiology CT Head Wo Contrast  Result Date: 04/18/2022 CLINICAL DATA:  Chest pain with shortness of breath, dizziness and high blood pressure. EXAM: CT HEAD WITHOUT CONTRAST TECHNIQUE: Contiguous axial images were obtained from the base of the skull through the vertex without intravenous contrast. RADIATION DOSE REDUCTION: This exam was performed according to the departmental dose-optimization program which includes automated exposure  control, adjustment of the mA and/or kV according to patient size and/or use of iterative reconstruction technique. COMPARISON:  None Available. FINDINGS: Brain: No evidence of acute infarction, hemorrhage, hydrocephalus, extra-axial collection or mass lesion/mass effect. Vascular: No hyperdense vessel or unexpected calcification. Skull: Normal. Negative for fracture or focal lesion. Sinuses/Orbits: No acute finding. Other: None. IMPRESSION: No acute intracranial pathology. Electronically Signed   By: Virgina Norfolk M.D.   On: 04/18/2022 18:31   DG Chest 2 View  Result Date: 04/18/2022 CLINICAL DATA:  Chest pain EXAM: CHEST - 2 VIEW COMPARISON:  Chest radiograph dated Jan 30, 2021 FINDINGS: The heart size and mediastinal contours are within normal limits. Both lungs are clear. The visualized skeletal structures are unremarkable. IMPRESSION: No active cardiopulmonary disease. Electronically Signed   By: Yetta Glassman M.D.   On: 04/18/2022 14:49    Procedures Procedures    Medications Ordered in ED Medications - No data to display  ED Course/ Medical Decision Making/ A&P                           Medical  Decision Making Amount and/or Complexity of Data Reviewed Labs: ordered. Radiology: ordered.   Patient presents due to hypertension, headache, chest pain and shortness of breath.  Differential is broad but includes but not limited to ACS, PE, hypertensive emergency, SAH, acute renal injury, electrolyte derangement, acute onset CHF, UTI  On exam there are no focal deficits, lungs are clear to auscultation bilaterally.  S1-S2, upper and lower extremity pulses symmetric.  Abdomen is soft nontender.  No hypoxia, patient's temperature is elevated at 99 but not febrile does not meet any SIRS criteria.  I ordered, viewed and interpreted laboratory work-up CBC with no leukocytosis or anemia BMP with no gross electrolyte derangement or AKI UA with trace hematuria but no proteinuria, infectious findings. No UTI Negative troponin. Dimer negative cannot, not consistent with a PE Patient is not COVID positive  I ordered and viewed imaging.  Per my interpretation chest x-ray is negative for any acute process.  CT head is negative for any acute process.  Agree with radiologist interpretation.  EKG shows sinus rhythm no ischemic findings.  Patient is on cardiac monitoring, she is in sinus rhythm.  Discussed work-up with patient.  Given negative findings I do not suspect ACS, SAH, PE, AKI. No findings of end organ damage, not consistent with emergent hypertension. Patient will follow-up with her PCP for blood pressure recheck this week and possible changes to her medicine.  In the meantime I encouraged to continue taking the lisinopril.        Final Clinical Impression(s) / ED Diagnoses Final diagnoses:  Shortness of breath  Chest pain, unspecified type    Rx / DC Orders ED Discharge Orders     None         Sherrill Raring, PA-C 04/19/22 0012    Malvin Johns, MD 04/27/22 [phone]

## 2022-04-18 NOTE — Discharge Instructions (Signed)
You are seen in the emergency department today for chest pain, shortness of breath and headache.  Your blood pressure has returned to normal, your work-up today was very reassuring.  Please call your primary to set up an appointment for blood pressure recheck and changing of medicine if needed for later this week.  Return to the ED for new or concerning symptoms, otherwise continue taking your home medicine follow-up with your primary.

## 2022-04-19 ENCOUNTER — Ambulatory Visit (INDEPENDENT_AMBULATORY_CARE_PROVIDER_SITE_OTHER): Payer: PPO | Admitting: Family Medicine

## 2022-04-19 ENCOUNTER — Other Ambulatory Visit: Payer: Self-pay

## 2022-04-19 ENCOUNTER — Emergency Department (HOSPITAL_COMMUNITY): Payer: PPO

## 2022-04-19 ENCOUNTER — Emergency Department (HOSPITAL_COMMUNITY)
Admission: EM | Admit: 2022-04-19 | Discharge: 2022-04-20 | Disposition: A | Discharge status: Home / Self Care | Payer: PPO | Attending: Emergency Medicine | Admitting: Emergency Medicine

## 2022-04-19 ENCOUNTER — Encounter (HOSPITAL_COMMUNITY): Payer: Self-pay

## 2022-04-19 DIAGNOSIS — M25562 Pain in left knee: Secondary | ICD-10-CM

## 2022-04-19 DIAGNOSIS — Z7982 Long term (current) use of aspirin: Secondary | ICD-10-CM | POA: Insufficient documentation

## 2022-04-19 DIAGNOSIS — F17200 Nicotine dependence, unspecified, uncomplicated: Secondary | ICD-10-CM | POA: Insufficient documentation

## 2022-04-19 DIAGNOSIS — I10 Essential (primary) hypertension: Secondary | ICD-10-CM | POA: Diagnosis not present

## 2022-04-19 DIAGNOSIS — M25561 Pain in right knee: Secondary | ICD-10-CM | POA: Diagnosis not present

## 2022-04-19 DIAGNOSIS — M25551 Pain in right hip: Secondary | ICD-10-CM | POA: Diagnosis not present

## 2022-04-19 DIAGNOSIS — M79602 Pain in left arm: Secondary | ICD-10-CM | POA: Insufficient documentation

## 2022-04-19 DIAGNOSIS — M25552 Pain in left hip: Secondary | ICD-10-CM

## 2022-04-19 DIAGNOSIS — Z79899 Other long term (current) drug therapy: Secondary | ICD-10-CM | POA: Diagnosis not present

## 2022-04-19 DIAGNOSIS — R519 Headache, unspecified: Secondary | ICD-10-CM | POA: Diagnosis not present

## 2022-04-19 DIAGNOSIS — R0789 Other chest pain: Secondary | ICD-10-CM | POA: Diagnosis not present

## 2022-04-19 DIAGNOSIS — G2581 Restless legs syndrome: Secondary | ICD-10-CM | POA: Diagnosis not present

## 2022-04-19 DIAGNOSIS — E1165 Type 2 diabetes mellitus with hyperglycemia: Secondary | ICD-10-CM

## 2022-04-19 DIAGNOSIS — R079 Chest pain, unspecified: Secondary | ICD-10-CM | POA: Diagnosis not present

## 2022-04-19 DIAGNOSIS — R209 Unspecified disturbances of skin sensation: Secondary | ICD-10-CM | POA: Insufficient documentation

## 2022-04-19 DIAGNOSIS — G8929 Other chronic pain: Secondary | ICD-10-CM

## 2022-04-19 DIAGNOSIS — R0602 Shortness of breath: Secondary | ICD-10-CM | POA: Diagnosis not present

## 2022-04-19 DIAGNOSIS — R42 Dizziness and giddiness: Secondary | ICD-10-CM | POA: Diagnosis not present

## 2022-04-19 LAB — BASIC METABOLIC PANEL
Anion gap: 8 (ref 5–15)
BUN: 12 mg/dL (ref 6–20)
CO2: 22 mmol/L (ref 22–32)
Calcium: 9.6 mg/dL (ref 8.9–10.3)
Chloride: 109 mmol/L (ref 98–111)
Creatinine, Ser: 0.72 mg/dL (ref 0.44–1.00)
GFR, Estimated: >60 mL/min (ref >60)
Glucose, Bld: 102 mg/dL — ABNORMAL HIGH (ref 70–99)
Potassium: 3.8 mmol/L (ref 3.5–5.1)
Sodium: 139 mmol/L (ref 135–145)

## 2022-04-19 LAB — CBC
HCT: 43 % (ref 36.0–46.0)
Hemoglobin: 14.1 g/dL (ref 12.0–15.0)
MCH: 27.4 pg (ref 26.0–34.0)
MCHC: 32.8 g/dL (ref 30.0–36.0)
MCV: 83.7 fL (ref 80.0–100.0)
Platelets: 315 10*3/uL (ref 150–400)
RBC: 5.14 MIL/uL — ABNORMAL HIGH (ref 3.87–5.11)
RDW: 15.1 % (ref 11.5–15.5)
WBC: 8.9 10*3/uL (ref 4.0–10.5)
nRBC: 0 % (ref 0.0–0.2)

## 2022-04-19 LAB — TROPONIN I (HIGH SENSITIVITY): Troponin I (High Sensitivity): 4 ng/L (ref <18)

## 2022-04-19 MED ORDER — LISINOPRIL 20 MG PO TABS: 20.0000 mg | ORAL_TABLET | Freq: Every day | ORAL | 3 refills | Status: DC

## 2022-04-19 MED ORDER — GABAPENTIN 300 MG PO CAPS: 300.0000 mg | ORAL_CAPSULE | Freq: Three times a day (TID) | ORAL | 2 refills | Status: DC

## 2022-04-19 NOTE — Telephone Encounter (Signed)
Seen at ED office visit today for follow up.

## 2022-04-19 NOTE — ED Triage Notes (Signed)
Patient BIB [facility] from home with complaints of high blood pressure. Yesterday went to PCP and increased her lisinopril dose. Dizzy when she stands, no changes in vision. Left arm and left side of her neck hurts.

## 2022-04-19 NOTE — Progress Notes (Unsigned)
Established Patient Office Visit  Subjective   Patient ID: [name], female    DOB: [birth year]  Age: 51 y.o. MRN: [id number]  Chief Complaint  Patient presents with   Hospitalization [facility] follow up, not sure if BP medication need to be increased. High readings at home     HPI for follow-up of hypertension diabetes and restless leg syndrome.  Blood pressure has been running higher at home.  She is seeing measurements in the 140s to 150/90-100 range.  There is increased stress in the household.  Needs a refill on gabapentin for chronic arthritic pains and restless leg syndrome.  {History (Optional):23778}  Review of Systems  Constitutional:  Negative for chills, diaphoresis, malaise/fatigue and weight loss.  HENT: Negative.    Eyes: Negative.  Negative for blurred vision and double vision.  Respiratory:  Negative for cough.   Cardiovascular:  Negative for chest pain.  Gastrointestinal:  Negative for abdominal pain.  Genitourinary: Negative.   Musculoskeletal:  Negative for falls and myalgias.  Neurological:  Negative for speech change, loss of consciousness and weakness.  Psychiatric/Behavioral: Negative.        Objective:     LMP 02/16/2022  {Vitals History (Optional):23777}  Physical Exam Constitutional:      General: She is not in acute distress.    Appearance: Normal appearance. She is not ill-appearing, toxic-appearing or diaphoretic.  HENT:     Head: Normocephalic and atraumatic.     Right Ear: External ear normal.     Left Ear: External ear normal.     Mouth/Throat:     Mouth: Mucous membranes are moist.     Pharynx: Oropharynx is clear. No oropharyngeal exudate or posterior oropharyngeal erythema.  Eyes:     General: No scleral icterus.       Right eye: No discharge.        Left eye: No discharge.     Extraocular Movements: Extraocular movements intact.     Conjunctiva/sclera: Conjunctivae normal.     Pupils: Pupils are equal, round,  and reactive to light.  Cardiovascular:     Rate and Rhythm: Normal rate and regular rhythm.  Pulmonary:     Effort: Pulmonary effort is normal. No respiratory distress.     Breath sounds: Normal breath sounds.  Abdominal:     General: Bowel sounds are normal.     Tenderness: There is no abdominal tenderness. There is no guarding.  Musculoskeletal:     Cervical back: No rigidity or tenderness.  Skin:    General: Skin is warm and dry.  Neurological:     Mental Status: She is alert and oriented to person, place, and time.  Psychiatric:        Mood and Affect: Mood normal.        Behavior: Behavior normal.      No results found for any visits on 04/19/22.  {Labs (Optional):23779}  The 10-year ASCVD risk score (Arnett DK, et al., 2019) is: 9.9%    Assessment & Plan:   Problem List Items Addressed This Visit       Cardiovascular and Mediastinum   Essential hypertension   Relevant Medications   lisinopril (ZESTRIL) 20 MG tablet     Endocrine   Type 2 diabetes mellitus with hyperglycemia, without long-term current use of insulin (HCC) - Primary   Relevant Medications   lisinopril (ZESTRIL) 20 MG tablet   Other Relevant Orders   Hemoglobin A1c     Other  Chronic arthralgias of knees and hips   Relevant Medications   gabapentin (NEURONTIN) 300 MG capsule   RLS (restless legs syndrome)   Relevant Medications   gabapentin (NEURONTIN) 300 MG capsule    Return in about 6 weeks (around 05/31/2022).  Have increased lisinopril to 20 mg.  Will RTC for hemoglobin A1c tomorrow.  Labs drawn at hospital were reviewed.  Follow-up will be in 6 weeks.  Libby Maw, MD

## 2022-04-19 NOTE — ED Provider Triage Note (Signed)
Emergency Medicine Provider Triage Evaluation Note  [name] , a 51 y.o. female  was evaluated in triage.  Pt complains of chest pain, shortness of breath, headache, dizziness, hypertension.  Patient was seen at [facility] day prior, had reassuring work-up.  The patient presents today complaining of the same complaints as yesterday.  The patient states that she took her lisinopril dosage was recently increased.    Review of Systems  Positive:  Negative:   Physical Exam  BP (!) 150/80 (BP Location: Left Arm)   Pulse 63   Temp 98.7 F (37.1 C) (Oral)   Resp 18   LMP 02/16/2022   SpO2 98%  Gen:   Awake, no distress   Resp:  Normal effort  MSK:   Moves extremities without difficulty  Other:    Medical Decision Making  Medically screening exam initiated at 9:09 PM.  Appropriate orders placed.  Quantia L Lieder was informed that the remainder of the evaluation will be completed by another provider, this initial triage assessment does not replace that evaluation, and the importance of remaining in the ED until their evaluation is complete.    Azucena Cecil, PA-C 04/19/22 2111

## 2022-04-20 ENCOUNTER — Other Ambulatory Visit (INDEPENDENT_AMBULATORY_CARE_PROVIDER_SITE_OTHER): Payer: PPO

## 2022-04-20 DIAGNOSIS — E1165 Type 2 diabetes mellitus with hyperglycemia: Secondary | ICD-10-CM | POA: Diagnosis not present

## 2022-04-20 LAB — TROPONIN I (HIGH SENSITIVITY): Troponin I (High Sensitivity): 4 ng/L (ref <18)

## 2022-04-20 LAB — HEMOGLOBIN A1C: Hgb A1c MFr Bld: 5.4 % (ref 4.6–6.5)

## 2022-04-20 NOTE — ED Provider Notes (Signed)
[place] DEPT Provider Note   CSN: [id number] Arrival date & time: 04/19/22  2052     History  Chief Complaint  Patient presents with   Hypertension    [name] is a 51 y.o. female.  HPI   Patient with medical history including depression, bipolar, GERD, anxiety, presents  with complaints of elevated blood pressure.  Patient states that earlier today started to have some chest pain left arm pain as well as numbness and tingling in both her hands and feet.  This came on suddenly while she was watching TV and then resolved on its own.  She states that she had a slight headache as well but there is no change in vision or weakness, no recent head trauma, not anticoag's.  She states when she had these symptoms she went and checked her blood pressure was elevated systolic was in the 081K, and she came here for further evaluation.  She currently has no complaints at this time, states all of her symptoms have resolved, she has no cardiac history no history PEs or DVTs currently on hormone therapy, no recent long immobilizations no recent surgeries, she does smoke is currently being treated for hypertension increase her lisinopril from 77m to 28mby her primary care doctor yesterday.  Reviewed patient's chart was seen at the ER on the 14th, she had benign work-up including a CT head, delta troponins, chest x-ray, seen her primary care doctor who increased her lisinopril.  Home Medications Prior to Admission medications   Medication Sig Start Date End Date Taking? Authorizing Provider  ALPRAZolam (XDuanne Moron0.5 MG tablet Take 0.5 mg by mouth 3 (three) times daily as needed for anxiety. 12/20/19  Yes [provider]  ARIPiprazole (ABILIFY) 5 MG tablet Take 1 tablet (5 mg total) by mouth daily. 09/17/15  Yes TaClarene ReamerMD  atorvastatin (LIPITOR) 20 MG tablet TAKE 1 TABLET(20 MG) BY MOUTH DAILY Patient taking differently: Take 20 mg by mouth daily.  TAKE 1 TABLET(20 MG) BY MOUTH DAILY 08/27/21  Yes KrLibby MawMD  diclofenac Sodium (VOLTAREN) 1 % GEL Apply 2 g topically daily as needed. 02/18/21  Yes [provider]  gabapentin (NEURONTIN) 300 MG capsule Take 1 capsule (300 mg total) by mouth 3 (three) times daily. 04/19/22  Yes KrLibby MawMD  lamoTRIgine (LAMICTAL) 150 MG tablet Take 300 mg by mouth daily. 02/22/20  Yes [provider]  levothyroxine (SYNTHROID) 75 MCG tablet TAKE 1 TABLET(75 MCG) BY MOUTH DAILY BEFORE BREAKFAST Patient taking differently: Take 75 mcg by mouth daily before breakfast. 02/25/22  Yes KrLibby MawMD  lisinopril (ZESTRIL) 20 MG tablet Take 1 tablet (20 mg total) by mouth daily. 04/19/22  Yes KrLibby MawMD  metFORMIN (GLUCOPHAGE) 500 MG tablet TAKE 1 TABLET(500 MG) BY MOUTH TWICE DAILY WITH A MEAL Patient taking differently: Take 500 mg by mouth 2 (two) times daily with a meal. 02/07/22  Yes KrLibby MawMD  OZEMPIC, 0.25 OR 0.5 MG/DOSE, 2 MG/3ML SOPN Inject 0.25 mg into the skin once a week. 11/23/21  Yes [provider]  pantoprazole (PROTONIX) 40 MG tablet Take 1 tablet by mouth daily. 01/06/21  Yes [provider]  aspirin-acetaminophen-caffeine (EXCEDRIN MIGRAINE) 25762-643-8286G tablet Take 1 tablet by mouth every 6 (six) hours as needed for migraine. Patient not taking: Reported on 04/19/2022    [provider]  blood glucose meter kit and supplies KIT Dispense based on patient  and insurance preference. Check glucose before breakfast. ICD: E11.65 04/02/20   Nche, Charlene Brooke, NP  FINACEA 15 % FOAM Apply 1 application topically 2 (two) times daily as needed (rosacea). Patient not taking: Reported on 12/09/2021 02/14/20   [provider]  glucose blood test strip Use as instructed, check glucose before breakfast. ICD: E11.65 07/21/20   Libby Maw, MD  Lancets (FREESTYLE) lancets Use as instructed.  E11.65 07/21/20   Libby Maw, MD  methylphenidate (RITALIN) 20 MG tablet Take 20 mg by mouth 2 (two) times daily. Patient not taking: Reported on 04/20/2022 09/22/20   [provider]      Allergies    Pregabalin, Celebrex [celecoxib], Effexor [venlafaxine hcl], Mirapex [pramipexole], Oxybutynin, Pristiq [desvenlafaxine], Prozac [fluoxetine hcl], Symbyax [olanzapine-fluoxetine hcl], and Wellbutrin [bupropion hcl]    Review of Systems   Review of Systems  Constitutional:  Negative for chills and fever.  Respiratory:  Negative for shortness of breath.   Cardiovascular:  Negative for chest pain.  Gastrointestinal:  Negative for abdominal pain.  Neurological:  Negative for headaches.    Physical Exam Updated Vital Signs BP 134/65   Pulse 64   Temp 98 F (36.7 C)   Resp 18   LMP 02/16/2022   SpO2 99%  Physical Exam Vitals and nursing note reviewed.  Constitutional:      General: She is not in acute distress.    Appearance: She is not ill-appearing.  HENT:     Head: Normocephalic and atraumatic.     Nose: No congestion.     Mouth/Throat:     Mouth: Mucous membranes are moist.     Pharynx: Oropharynx is clear.     Comments: No trismus no torticollis no oral trauma Eyes:     Conjunctiva/sclera: Conjunctivae normal.  Cardiovascular:     Rate and Rhythm: Normal rate and regular rhythm.     Pulses: Normal pulses.     Heart sounds: No murmur heard.    No friction rub. No gallop.  Pulmonary:     Effort: No respiratory distress.     Breath sounds: No wheezing, rhonchi or rales.  Skin:    General: Skin is warm and dry.  Neurological:     Mental Status: She is alert.     GCS: GCS eye subscore is 4. GCS verbal subscore is 5. GCS motor subscore is 6.     Cranial Nerves: Cranial nerves 2-12 are intact.     Sensory: Sensation is intact.     Motor: No weakness.     Coordination: Coordination is intact. Romberg sign negative. Finger-Nose-Finger Test normal.      Comments: Cranial nerves II through XII grossly intact no difficulty with word finding following two-step commands no unilateral weakness present.  Psychiatric:        Mood and Affect: Mood normal.     ED Results / Procedures / Treatments   Labs (all labs ordered are listed, but only abnormal results are displayed) Labs Reviewed  BASIC METABOLIC PANEL - Abnormal; Notable for the following components:      Result Value   Glucose, Bld 102 (*)    All other components within normal limits  CBC - Abnormal; Notable for the following components:   RBC 5.14 (*)    All other components within normal limits  TROPONIN I (HIGH SENSITIVITY)  TROPONIN I (HIGH SENSITIVITY)    EKG None  Radiology DG Chest Port 1 View  Result Date: 04/19/2022 CLINICAL DATA:  Chest  pain, shortness of breath. EXAM: PORTABLE CHEST 1 VIEW COMPARISON:  04/18/2022. FINDINGS: The heart size and mediastinal contours are within normal limits. Both lungs are clear. No acute osseous abnormality. IMPRESSION: No active disease. Electronically Signed   By: Brett Fairy M.D.   On: 04/19/2022 21:31   CT Head Wo Contrast  Result Date: 04/18/2022 CLINICAL DATA:  Chest pain with shortness of breath, dizziness and high blood pressure. EXAM: CT HEAD WITHOUT CONTRAST TECHNIQUE: Contiguous axial images were obtained from the base of the skull through the vertex without intravenous contrast. RADIATION DOSE REDUCTION: This exam was performed according to the departmental dose-optimization program which includes automated exposure control, adjustment of the mA and/or kV according to patient size and/or use of iterative reconstruction technique. COMPARISON:  None Available. FINDINGS: Brain: No evidence of acute infarction, hemorrhage, hydrocephalus, extra-axial collection or mass lesion/mass effect. Vascular: No hyperdense vessel or unexpected calcification. Skull: Normal. Negative for fracture or focal lesion. Sinuses/Orbits: No acute finding.  Other: None. IMPRESSION: No acute intracranial pathology. Electronically Signed   By: Virgina Norfolk M.D.   On: 04/18/2022 18:31   DG Chest 2 View  Result Date: 04/18/2022 CLINICAL DATA:  Chest pain EXAM: CHEST - 2 VIEW COMPARISON:  Chest radiograph dated Jan 30, 2021 FINDINGS: The heart size and mediastinal contours are within normal limits. Both lungs are clear. The visualized skeletal structures are unremarkable. IMPRESSION: No active cardiopulmonary disease. Electronically Signed   By: Yetta Glassman M.D.   On: 04/18/2022 14:49    Procedures Procedures    Medications Ordered in ED Medications - No data to display  ED Course/ Medical Decision Making/ A&P                           Medical Decision Making  This patient presents to the ED for concern of chest pain, this involves an extensive number of treatment options, and is a complaint that carries with it a high risk of complications and morbidity.  The differential diagnosis includes ACS, PE, pneumonia, pneumothorax, CVA    Additional history obtained:  Additional history obtained from N/A External records from outside source obtained and reviewed including primary care notes, ER note   Co morbidities that complicate the patient evaluation  Anxiety  Social Determinants of Health:  N/A    Lab Tests:  I Ordered, and personally interpreted labs.  The pertinent results include: CBC unremarkable, BMP shows glucose of 102, negative delta troponin   Imaging Studies ordered:  I ordered imaging studies including chest x-ray I independently visualized and interpreted imaging which showed an markable I agree with the radiologist interpretation   Cardiac Monitoring:  The patient was maintained on a cardiac monitor.  I personally viewed and interpreted the cardiac monitored which showed an underlying rhythm of: EKG without signs of ischemia   Medicines ordered and prescription drug management:  I ordered  medication including N/A I have reviewed the patients home medicines and have made adjustments as needed  Critical Interventions:  N/A   Reevaluation:  Presents with chest pain, triage obtain lab work imaging which I have personally reviewed, she is currently have no complaints this time.  Will await second troponin.  Patient is in agreement with plan discharge at this time.  Consultations Obtained:  N/A   Test Considered:  CT head-deferred my suspicion for internal head bleed low at this time no head trauma not anticoag's no focal deficit noted.    Rule  out  Low suspicion for CVA she has no focal deficit present my exam.  Low suspicion for dissection of the vertebral or carotid artery as presentation atypical of etiology.  Low suspicion for meningitis as she has no meningeal sign present. I have low suspicion for ACS as history is atypical, patient has no cardiac history, EKG was sinus rhythm without signs of ischemia, patient had negative delta troponin.  Low suspicion for PE as patient denies pleuritic chest pain, shortness of breath, patient denies leg pain, no pedal edema noted on exam, patient was PERC negative.  Low suspicion for AAA or aortic dissection as history is atypical, patient has low risk factors.  Low suspicion for systemic infection as patient is nontoxic-appearing, vital signs reassuring, no obvious source infection noted on exam.      Dispostion and problem list  After consideration of the diagnostic results and the patients response to treatment, I feel that the patent would benefit from discharge.  Atypical chest pain-unclear etiology, likely anxiety driven, continue with her blood pressure medications, follow-up with her PCP for further evaluation and strict return precautions.            Final Clinical Impression(s) / ED Diagnoses Final diagnoses:  Atypical chest pain    Rx / DC Orders ED Discharge Orders     None          Marcello Fennel, PA-C 04/20/22 0114    Merryl Hacker, MD 04/20/22 [phone]

## 2022-04-20 NOTE — Discharge Instructions (Signed)
Lab work imaging all reassuring please continue with all home medications, I recommend taking your blood pressure twice a day 1 in the morning 1 at nighttime.  Follow-up with PCP for further assessment  Come back to the emergency department if you develop chest pain, shortness of breath, severe abdominal pain, uncontrolled nausea, vomiting, diarrhea.

## 2022-04-21 ENCOUNTER — Ambulatory Visit: Payer: PPO | Admitting: Family Medicine

## 2022-04-21 ENCOUNTER — Telehealth: Payer: Self-pay | Admitting: Family Medicine

## 2022-04-21 ENCOUNTER — Encounter: Payer: Self-pay | Admitting: Family Medicine

## 2022-04-21 NOTE — Telephone Encounter (Signed)
Patient aware of lab results.

## 2022-04-21 NOTE — Telephone Encounter (Signed)
Caller Name: Sabre Romberger  Call back phone #: [phone]  Reason for Call: pt wanted to speak with a cma about the results of her A1C test that she recently had done.

## 2022-04-25 ENCOUNTER — Other Ambulatory Visit: Payer: Self-pay | Admitting: Gastroenterology

## 2022-04-25 DIAGNOSIS — K7400 Hepatic fibrosis, unspecified: Secondary | ICD-10-CM

## 2022-04-26 ENCOUNTER — Ambulatory Visit: Payer: PPO | Admitting: Orthopaedic Surgery

## 2022-04-27 ENCOUNTER — Telehealth: Payer: Self-pay

## 2022-04-27 NOTE — Telephone Encounter (Addendum)
Transition Care Management Follow-up Telephone Call Date of discharge and from where: 04/20/2022 [facility] How have you been since you were released from the hospital? Doing ok, BP still a little elevated, but I have been stressed out with family issues. Any questions or concerns? Yes  Items Reviewed: Did the pt receive and understand the discharge instructions provided? Yes  Medications obtained and verified? No  Other? No  Any new allergies since your discharge? No  Dietary orders reviewed? No Do you have support at home? Yes   Home Care and Equipment/Supplies: Were home health services ordered? not applicable If so, what is the name of the agency? N/a  Has the agency set up a time to come to the patient's home? not applicable Were any new equipment or medical supplies ordered?  No What is the name of the medical supply agency? N/a Were you able to get the supplies/equipment? not applicable Do you have any questions related to the use of the equipment or supplies? No  Functional Questionnaire: (I = Independent and D = Dependent) ADLs: D  Bathing/Dressing- D  Meal Prep- D  Eating- D  Maintaining continence- D  Transferring/Ambulation- D  Managing Meds- D  Follow up appointments reviewed:  PCP Hospital f/u appt confirmed? No  Scheduled to see N/A on N/A @ N/A. Pt states she will call the office to schedule when she gets home.  06/02/22 Pt does not feel the need to schedule a HOSP F/U at this time. She says she is monitoringher BP at home at it has been good. She believes the chest pain was c/b anxiety. Pt instructed to call us at the office in the event she needs anything. [facility] f/u appt confirmed? No  Scheduled to see N/A on N/A @ N/A. Are transportation arrangements needed? No  If their condition worsens, is the pt aware to call PCP or go to the Emergency Dept.? Yes Was the patient provided with contact information for the PCP's office or ED? Yes Was to pt  encouraged to call back with questions or concerns? Yes   Angeline Slim, RN, BSN

## 2022-05-02 NOTE — Telephone Encounter (Signed)
Na

## 2022-05-05 ENCOUNTER — Ambulatory Visit (INDEPENDENT_AMBULATORY_CARE_PROVIDER_SITE_OTHER): Payer: PPO

## 2022-05-05 ENCOUNTER — Encounter: Payer: Self-pay | Admitting: Orthopaedic Surgery

## 2022-05-05 ENCOUNTER — Ambulatory Visit: Payer: Self-pay

## 2022-05-05 ENCOUNTER — Ambulatory Visit (INDEPENDENT_AMBULATORY_CARE_PROVIDER_SITE_OTHER): Payer: PPO | Admitting: Orthopaedic Surgery

## 2022-05-05 VITALS — BP 118/79 | HR 71

## 2022-05-05 DIAGNOSIS — G8929 Other chronic pain: Secondary | ICD-10-CM

## 2022-05-05 DIAGNOSIS — M25512 Pain in left shoulder: Secondary | ICD-10-CM | POA: Diagnosis not present

## 2022-05-05 NOTE — Progress Notes (Signed)
   Procedure Note  Patient: [name]             Date of Birth: [birth year]           MRN: [id number]             Visit Date: 05/05/2022  Procedures: Visit Diagnoses:  1. Chronic left shoulder pain    Procedure: US-guided glenohumeral joint injection, left shoulder After informed verbal consent and discussion on risks/benefits/indications was given, a timeout was performed, patient was lying lateral recumbent on exam table. The patient's left shoulder was prepped with multiple alcohol swabs and utilizing ultrasound guidance, the patient's glenohumeral joint was identified on ultrasound. Using ultrasound guidance a 22-gauge, 3.5 inch needle with a mixture of 2:2:1 cc's lidocaine:bupivicaine:depomedrol was directed from a lateral to medial direction via in-plane technique into the glenohumeral joint with visualization of appropriate spread of injectate into the joint. Patient tolerated the procedure well without immediate complications.   - evaluated patient 10 mins post-injection and had quite significant improvement in pain and ROM (forward flexion from 85 --> 130 degrees) - follow-up with Dr. Erlinda Hong as indicated; may follow-up with me as needed - if not improving, will recommend 2nd injection around 2-3 week mark at times)  Elba Barman, DO [place]  This note was dictated using Dragon naturally speaking software and may contain errors in syntax, spelling, or content which have not been identified prior to signing this note.

## 2022-05-05 NOTE — Progress Notes (Signed)
Office Visit Note   Patient: [name]           Date of Birth: 25-Dec-1970           MRN: [id number] Visit Date: 05/05/2022              Requested by: Libby Maw, MD [address] [place],  [place] [address] PCP: Libby Maw, MD   Assessment & Plan: Visit Diagnoses:  1. Chronic left shoulder pain     Plan: It appears that she has calcific tendinitis in the left shoulder questionable adhesive capsulitis.  We will send her to Dr. Rolena Infante for intra-articular injection and have made referral to outpatient PT.  Recheck patient in about 6 weeks.  Follow-Up Instructions: Return in about 6 weeks (around 06/16/2022).   Orders:  Orders Placed This Encounter  Procedures   XR Shoulder Left   US Guided Needle Placement - No Linked Charges   Ambulatory referral to Physical Therapy   No orders of the defined types were placed in this encounter.     Procedures: No procedures performed   Clinical Data: No additional findings.   Subjective: Chief Complaint  Patient presents with   Left Shoulder - Pain    HPI [name] is a 51 year old female here for chronic left shoulder pain for 3 months with decreased range of motion and diffuse pain.  Denies any injuries.  Right-hand-dominant.  Denies any radicular symptoms.  Pain with elevation of the arm above the head.  On a side note she has lost 130 pounds.  She feels great.  Review of Systems  Constitutional: Negative.   HENT: Negative.    Eyes: Negative.   Respiratory: Negative.    Cardiovascular: Negative.   Endocrine: Negative.   Musculoskeletal: Negative.   Neurological: Negative.   Hematological: Negative.   Psychiatric/Behavioral: Negative.    All other systems reviewed and are negative.    Objective: Vital Signs: BP 118/79   Pulse 71   Physical Exam Vitals and nursing note reviewed.  Constitutional:      Appearance: She is well-developed.  HENT:     Head: Atraumatic.     Nose: Nose  normal.  Eyes:     Extraocular Movements: Extraocular movements intact.  Cardiovascular:     Pulses: Normal pulses.  Pulmonary:     Effort: Pulmonary effort is normal.  Abdominal:     Palpations: Abdomen is soft.  Musculoskeletal:     Cervical back: Neck supple.  Skin:    General: Skin is warm.     Capillary Refill: Capillary refill takes less than 2 seconds.  Neurological:     Mental Status: She is alert. Mental status is at baseline.  Psychiatric:        Behavior: Behavior normal.        Thought Content: Thought content normal.        Judgment: Judgment normal.     Ortho Exam Examination of left shoulder shows normal passive range of motion with pain towards the extremes.  Manual muscle testing of the rotator cuff is normal with mild pain.  No significant pain with impingement testing.  No pain with testing of the biceps or superior labrum.  AC joint is asymptomatic. Specialty Comments:  No specialty comments available.  Imaging: XR Shoulder Left  Result Date: 05/05/2022 Calcifications posterior to the humeral head probably from calcific tendinitis.    PMFS History: Patient Active Problem List   Diagnosis Date Noted   Abscess  03/30/2022   Amenorrhea 10/29/2021   Abnormal findings on diagnostic imaging of liver and biliary tract 10/13/2021   Colon cancer screening 10/13/2021   Constipation 10/13/2021   Gallstones, common bile duct 10/13/2021   Gastroesophageal reflux disease 10/13/2021   History of hepatitis C 10/13/2021   Intestinal gas excretion 10/13/2021   Liver fibrosis 10/13/2021   Right upper quadrant pain 10/13/2021   Stress 06/21/2021   Need for influenza vaccination 06/21/2021   Periorbital hyperpigmentation 05/03/2021   Abnormal CBC 05/03/2021   Lower respiratory infection 02/04/2021   Hematuria 12/28/2020   Status post total replacement of left hip 10/12/2020   Type 2 diabetes mellitus with hyperglycemia, without long-term current use of insulin  ([place]) 04/02/2020   Peripheral edema 04/02/2020   COVID-19 07/09/2019   Left hip pain 04/30/2019   Otitis externa of left ear 04/30/2019   Cholesteatoma of left ear 04/30/2019   Dysfunction of left eustachian tube 04/30/2019   Left ear pain 04/30/2019   Sciatica 04/19/2019   Class 3 severe obesity due to excess calories with serious comorbidity and body mass index (BMI) of 45.0 to 49.9 in adult ([place]) 04/19/2019   Post-nasal drip 01/10/2019   Allergic cough 01/10/2019   Obstructive sleep apnea syndrome 12/11/2018   Acute frontal sinusitis 12/10/2018   Primary osteoarthritis of left hip 11/13/2018   Morbid obesity ([place]) 11/13/2018   Elevated cholesterol 11/12/2018   Essential hypertension 11/12/2018   Allergic rhinitis due to pollen 11/12/2018   Reactive airway disease 11/12/2018   Breast pain, right 07/17/2018   Prediabetes 07/17/2018   Refused influenza vaccine 07/17/2018   Chronic nonintractable headache 02/12/2018   Dysuria 02/12/2018   Localized edema 02/12/2018   Hospital discharge follow-up 01/23/2018   Bladder problem 01/05/2018   Depression 01/05/2018   Insomnia 01/05/2018   Hepatitis C infection 12/01/2015   RLS (restless legs syndrome) 10/23/2015   Elevated liver enzymes 10/22/2015   Recurrent major depression-severe ([place]) 04/17/2015   GAD (generalized anxiety disorder) 04/15/2015   Bipolar depression ([place]) 04/15/2015   Severe recurrent major depression w/psychotic features, mood-congruent ([place]) 04/14/2015   Anxiety disorder 04/14/2015   Major depressive disorder, recurrent severe without psychotic features ([place]) 04/14/2015   Suicidal ideations    Chronic LBP 01/02/2015   Adult hypothyroidism 06/25/2012   B12 deficiency 06/25/2012   Other fatigue 06/25/2012   Chronic arthralgias of knees and hips 06/25/2012   Leukocytosis 06/25/2012   Vitamin D deficiency 06/25/2012   Past Medical History:  Diagnosis Date   ADHD (attention deficit hyperactivity disorder)     Anemia    with pregnancy   Anxiety    Arthritis    Bipolar disorder (HCC)    Chronic back pain greater than 3 months duration    Depression    Diabetes mellitus without complication ([place])    Diabetic nephropathy ([place])    bilateral feet   Dysuria    has to have self in and out cath    Eroded bladder suspension mesh (HCC)    GERD (gastroesophageal reflux disease)    Hepatitis    C   History of COVID-19 11/2019   History of MRSA infection    after back surgery   History of sleep apnea    resolved after weight loss   Hypertension    Hypothyroidism    Leg pain, bilateral    Obesity    Obsessive-compulsive disorder    Pneumonia 12/24/2019   Rosacea    Sciatic nerve pain    Social  anxiety disorder    Squamous cell carcinoma of back 2021    Family History  Problem Relation Age of Onset   Schizophrenia Father    Schizophrenia Cousin    Hyperthyroidism Mother    Hypothyroidism Maternal Grandmother     Past Surgical History:  Procedure Laterality Date   BACK SURGERY     total of 6 back surgeries   bladder mesh  2009   DILITATION & CURRETTAGE/HYSTROSCOPY WITH NOVASURE ABLATION N/A 08/19/2016   Procedure: DILATATION & CURETTAGE WITH NOVASURE ABLATION;  Surgeon: [name], MD;  Location: [facility];  Service: Gynecology;  Laterality: N/A;   EUS N/A 09/09/2015   Procedure: UPPER ENDOSCOPIC ULTRASOUND (EUS) RADIAL;  Surgeon: [name], MD;  Location: [facility];  Service: Endoscopy;  Laterality: N/A;   EYE SURGERY     INCONTINENCE SURGERY     SPINAL CORD STIMULATOR INSERTION     SPINAL CORD STIMULATOR REMOVAL     SPINAL FUSION  2008   times two   TOTAL HIP ARTHROPLASTY Left 10/12/2020   Procedure: LEFT TOTAL HIP ARTHROPLASTY ANTERIOR APPROACH;  Surgeon: Leandrew Koyanagi, MD;  Location: [facility];  Service: Orthopedics;  Laterality: Left;   TUBAL LIGATION  2006   Social History   Occupational History   Occupation: disability  Tobacco Use   Smoking status: Former     Packs/day: 1.00    Years: 15.00    Total pack years: 15.00    Types: Cigarettes    Quit date: 10/04/2021    Years since quitting: 0.5   Smokeless tobacco: Former   Tobacco comments:    use vapor, social  Vaping Use   Vaping Use: Never used  Substance and Sexual Activity   Alcohol use: No   Drug use: No   Sexual activity: Yes    Birth control/protection: Surgical

## 2022-05-17 ENCOUNTER — Ambulatory Visit: Payer: PPO | Admitting: Advanced Practice Midwife

## 2022-05-18 ENCOUNTER — Encounter: Payer: Self-pay | Admitting: *Deleted

## 2022-05-18 NOTE — Progress Notes (Signed)
[facility] Quality Team Note  Name: [name] Date of Birth: 26-Mar-1971 MRN: [id number] Date: 05/18/2022  [facility] Quality Team has reviewed this patient's chart, please see recommendations below:  [facility] Quality Other; (Pt has open gap for KED measure.  Needs urine albumin creatinine ratio test in order to close gap.  )

## 2022-05-20 ENCOUNTER — Other Ambulatory Visit: Payer: Self-pay | Admitting: Family Medicine

## 2022-05-20 NOTE — Telephone Encounter (Signed)
Spoke with patient to schedule hosp follow up appt with Dr. Ethelene Hal. Pt requests to call back to decide what day she can come and if she will have transportation.

## 2022-05-25 ENCOUNTER — Ambulatory Visit: Payer: PPO | Admitting: Rehabilitative and Restorative Service Providers"

## 2022-05-26 ENCOUNTER — Ambulatory Visit: Payer: PPO

## 2022-05-26 ENCOUNTER — Other Ambulatory Visit: Payer: PPO

## 2022-05-31 ENCOUNTER — Ambulatory Visit: Payer: PPO | Admitting: Orthopaedic Surgery

## 2022-06-02 ENCOUNTER — Telehealth: Payer: Self-pay | Admitting: Family Medicine

## 2022-06-02 NOTE — Telephone Encounter (Signed)
Pt stated she need a referral for a new gastro doctor. Pt was dismissed from [facility]

## 2022-06-02 NOTE — Telephone Encounter (Signed)
Please advise message below patient states that she was dismissed from [facility] due to no transportation and unable to make it to appointments.

## 2022-06-07 ENCOUNTER — Other Ambulatory Visit: Payer: Self-pay

## 2022-06-07 DIAGNOSIS — K760 Fatty (change of) liver, not elsewhere classified: Secondary | ICD-10-CM

## 2022-06-07 NOTE — Telephone Encounter (Signed)
Referral place patient aware someone will call to schedule appointment.

## 2022-06-07 NOTE — Telephone Encounter (Signed)
Spoke with patient who states that GI referral is for fatty liver and history of Hep C. Okay for referral?

## 2022-06-09 ENCOUNTER — Telehealth: Payer: Self-pay | Admitting: Family Medicine

## 2022-06-09 ENCOUNTER — Other Ambulatory Visit: Payer: Self-pay | Admitting: Family Medicine

## 2022-06-09 MED ORDER — DICLOFENAC SODIUM 1 % EX GEL
2.0000 g | Freq: Every day | CUTANEOUS | 1 refills | Status: DC | PRN
Start: 2022-06-09 — End: 2022-06-20

## 2022-06-09 NOTE — Telephone Encounter (Signed)
Refill request for pending Rx last OV 04/19/22

## 2022-06-09 NOTE — Telephone Encounter (Signed)
Patient calling states that she has a abscess in her mouth would like to know if Dr. Ethelene Hal would be able to send in something for this she currently does not have dental insurance. Please advise

## 2022-06-09 NOTE — Telephone Encounter (Signed)
Caller Name: Abrie Egloff Call back phone #: [phone]  Reason for Call: Pt was hoping to get an antibiotic for an abscess in her tooth

## 2022-06-09 NOTE — Telephone Encounter (Signed)
Caller Name: pt Call back phone #: [phone]   MEDICATION(S):  diclofenac Sodium (VOLTAREN) 1 % GEL [[id number]]    Has the patient contacted their pharmacy (YES/NO)? Yes, contact your PCP   Preferred Pharmacy: [facility]  [place], [place] [address]  Phone:  [phone]  Fax:  [phone]

## 2022-06-10 NOTE — Telephone Encounter (Signed)
Patient states that she will find a ride to come in and then call us to schedule.

## 2022-06-16 ENCOUNTER — Telehealth: Payer: Self-pay

## 2022-06-16 ENCOUNTER — Ambulatory Visit (INDEPENDENT_AMBULATORY_CARE_PROVIDER_SITE_OTHER): Payer: PPO

## 2022-06-16 DIAGNOSIS — E7800 Pure hypercholesterolemia, unspecified: Secondary | ICD-10-CM

## 2022-06-16 DIAGNOSIS — F319 Bipolar disorder, unspecified: Secondary | ICD-10-CM

## 2022-06-16 NOTE — Telephone Encounter (Signed)
   Telephone encounter was:  Unsuccessful.  06/16/2022 Name: TAKYAH CIARAMITARO MRN: [id number] DOB: 1971/06/18  Unsuccessful outbound call made today to assist with:  Transportation Needs   Outreach Attempt:  1st Attempt  A HIPAA compliant voice message was left requesting a return call.  Instructed patient to call back at [phone].  [place] Resource Care Guide   [email or web address]  ?? [phone]   Website: [email or web address]  [email or web address]

## 2022-06-16 NOTE — Progress Notes (Signed)
Chronic Care Management Pharmacy Note  06/20/2022 Name:  [name] MRN:  [id number] DOB:  1971/05/19  Summary: Patient presents for initial CCM Consult. Patient has significant concerns with food scarcity and lack of transportation.   Recommendations/Changes made from today's visit: Continue current medications  Plan: CPP follow-up 6 months   Subjective: [name] is an 51 y.o. year old female who is a primary patient of Ethelene Hal, Mortimer Fries, MD.  The CCM team was consulted for assistance with disease management and care coordination needs.    Engaged with patient by telephone for follow up visit in response to provider referral for pharmacy case management and/or care coordination services.   Consent to Services:  The patient was given information about Chronic Care Management services, agreed to services, and gave verbal consent prior to initiation of services.  Please see initial visit note for detailed documentation.   Patient Care Team: Libby Maw, MD as PCP - General (Family Medicine) Bjorn Loser, MD as Consulting Physician (Urology) [name], MD as Consulting Physician (Gastroenterology) Sharyne Peach, MD as Consulting Physician (Ophthalmology) Germaine Pomfret, [facility] (Pharmacist)  Recent office visits: 04/19/2022: Patient presented to Dr. Ethelene Hal for follow-up. Lisinopril 20 mg daily.  03/30/22: Patient presented to Dr. Grandville Silos for abscess. Doxycycline.   Recent consult visits: 11/05/2021 Dr. Erlinda Hong MD (Orthopedic) No medication Changes Noted,  Return in about 1 year  08/04/2021 Bjorn Loser MD (Urology) Unable to see note 07/27/2021 Bjorn Loser MD (Urology) Unable to see note 07/21/2021 Stephannie Peters NP (Family Medicine) Unable to see note 07/13/2021 Marygrace Drought MD (Ophthalmology) Unable to see note 07/05/2021 Dr. Matilde Sprang MD (Urology) star ciprofloxacin HCI 250 mg 2 times daily, Change clomipramine HCI to 75 mg  daily 06/21/2021 Dr. Matilde Sprang MD (Urology) stop mirabegron, start Oxybutynin ER 10 mg daily 06/03/2021 Stephannie Peters (Family Medicine) Unable to see note 05/24/2021 Dr. Matilde Sprang MD (Urology) start mirabegron ER 50 MG daily , Return in about 6 weeks   Hospital visits: 04/19/22: Patient presented to ED for atypical chest pain  04/18/22: Patient presented to Ed for shortness of breath    Objective:  Lab Results  Component Value Date   CREATININE 0.72 04/19/2022   BUN 12 04/19/2022   GFR 102.31 10/29/2021   GFRNONAA >60 04/19/2022   GFRAA >60 12/30/2019   NA 139 04/19/2022   K 3.8 04/19/2022   CALCIUM 9.6 04/19/2022   CO2 22 04/19/2022   GLUCOSE 102 (H) 04/19/2022    Lab Results  Component Value Date/Time   HGBA1C 5.4 04/20/2022 09:53 AM   HGBA1C 5.5 10/29/2021 11:25 AM   GFR 102.31 10/29/2021 11:25 AM   GFR 91.16 06/21/2021 11:50 AM   MICROALBUR <0.7 12/16/2020 11:37 AM    Last diabetic Eye exam:  Lab Results  Component Value Date/Time   HMDIABEYEEXA No Retinopathy 07/13/2021 12:00 AM    Last diabetic Foot exam: No results found for: "HMDIABFOOTEX"   Lab Results  Component Value Date   CHOL 135 10/29/2021   HDL 38.30 (L) 10/29/2021   LDLCALC 76 10/29/2021   LDLDIRECT 93.0 12/16/2020   TRIG 103.0 10/29/2021   CHOLHDL 4 10/29/2021       Latest Ref Rng & Units 12/16/2020   11:37 AM 10/08/2020   12:03 PM 07/07/2020    2:35 PM  Hepatic Function  Total Protein 6.0 - 8.3 g/dL 6.6  6.8  6.9   Albumin 3.5 - 5.2 g/dL 3.9  4.0  4.1   AST 0 -  37 U/L _0 ALT 0 - 35 U/L _1 Alk Phosphatase 39 - 117 U/L 87  87  81   Total Bilirubin 0.2 - 1.2 mg/dL 0.3  0.4  0.4     Lab Results  Component Value Date/Time   TSH 0.84 11/18/2021 10:10 AM   TSH 1.09 06/21/2021 11:50 AM   FREET4 0.92 04/02/2020 11:14 AM   FREET4 1.00 04/16/2015 06:24 AM       Latest Ref Rng & Units 04/19/2022    9:21 PM 04/18/2022    2:38 PM 11/18/2021   10:10 AM  CBC  WBC 4.0 -  10.5 K/uL 8.9  9.0  10.9   Hemoglobin 12.0 - 15.0 g/dL 14.1  13.1  13.6   Hematocrit 36.0 - 46.0 % 43.0  40.0  41.0   Platelets 150 - 400 K/uL 315  340  295.0     No results found for: "VD25OH"  Clinical ASCVD: No  The 10-year ASCVD risk score (Arnett DK, et al., 2019) is: 6.7%   Values used to calculate the score:     Age: 23 years     Sex: Female     Is Non-Hispanic African American: No     Diabetic: Yes     Tobacco smoker: Yes     Systolic Blood Pressure: 284 mmHg     Is BP treated: Yes     HDL Cholesterol: 38.3 mg/dL     Total Cholesterol: 135 mg/dL       04/19/2022    4:26 PM 03/30/2022    3:24 PM 11/16/2021    4:18 PM  Depression screen PHQ 2/9  Decreased Interest 0 2 0  Down, Depressed, Hopeless 0 1 0  PHQ - 2 Score 0 3 0  Altered sleeping  0   Tired, decreased energy  2   Change in appetite  1   Feeling bad or failure about yourself   1   Trouble concentrating  1   Moving slowly or fidgety/restless  0   Suicidal thoughts  0   PHQ-9 Score  8   Difficult doing work/chores Not difficult at all Somewhat difficult     Social History   Tobacco Use  Smoking Status Former   Packs/day: 1.00   Years: 15.00   Total pack years: 15.00   Types: Cigarettes   Quit date: 10/04/2021   Years since quitting: 0.7  Smokeless Tobacco Former  Tobacco Comments   use vapor, social   BP Readings from Last 3 Encounters:  05/05/22 118/79  04/20/22 134/65  04/18/22 (!) 144/85   Pulse Readings from Last 3 Encounters:  05/05/22 71  04/20/22 64  04/18/22 64   Wt Readings from Last 3 Encounters:  04/18/22 164 lb (74.4 kg)  03/30/22 178 lb (80.7 kg)  11/16/21 183 lb (83 kg)   BMI Readings from Last 3 Encounters:  04/18/22 28.15 kg/m  03/30/22 30.55 kg/m  11/16/21 31.41 kg/m    Assessment/Interventions: Review of patient past medical history, allergies, medications, health status, including review of consultants reports, laboratory and other test data, was performed as  part of comprehensive evaluation and provision of chronic care management services.   SDOH:  (Social Determinants of Health) assessments and interventions performed: Yes SDOH Interventions    Flowsheet Row Telephone from 12/29/2021 in [place] [facility] from 12/09/2021 in [place] from 10/13/2021 in [place] [facility]  [place] from 08/11/2020 in [place] from 04/22/2015 in confidential department  SDOH Interventions       Food Insecurity Interventions Other (Comment)  [facility] food pantry list.] Other (Comment) [id number] Referral -- --  Housing Interventions -- -- Intervention Not Indicated -- --  Transportation Interventions Other (Comment)  [Emailed information and application for [employer] transportation] Other (Comment) [id number] Referral Other (Comment)  [Community Resource Referral] --  Depression Interventions/Treatment  -- -- -- -- Currently on Treatment  Financial Strain Interventions -- -- [id number] Referral -- --  Physical Activity Interventions -- -- Intervention Not Indicated Intervention Not Indicated  [pt has a goal to lose weight & increase activity] --  Stress Interventions -- -- Other (Comment)  [she takes meds and sees psych] -- --  Social Connections Interventions -- -- Intervention Not Indicated -- --      SDOH Screenings   Food Insecurity: Food Insecurity Present (12/29/2021)  Housing: Low Risk  (10/13/2021)  Transportation Needs: Unmet Transportation Needs (06/20/2022)  Alcohol Screen: Low Risk  (10/13/2021)  Depression (PHQ2-9): Low Risk  (04/19/2022)  Recent Concern: Depression (PHQ2-9) - Medium Risk (03/30/2022)  Financial Resource Strain: Medium Risk (10/13/2021)  Physical Activity: Sufficiently Active (10/13/2021)  Social Connections: Socially Isolated (10/13/2021)  Stress: Stress Concern Present (10/13/2021)  Tobacco Use: Medium  Risk (05/05/2022)    [place]  Allergies  Allergen Reactions   Pregabalin Anaphylaxis   Celebrex [Celecoxib] Nausea And Vomiting    Stomach pain   Effexor [Venlafaxine Hcl] Other (See Comments)    Serotonin toxicity   Mirapex [Pramipexole]     Mirapex- Worsening "body jerks"   Oxybutynin Other (See Comments)    Urinary retention   Pristiq [Desvenlafaxine] Other (See Comments)    Serotonin toxicity    Prozac [Fluoxetine Hcl] Swelling   Symbyax [Olanzapine-Fluoxetine Hcl] Swelling   Wellbutrin [Bupropion Hcl] Swelling    Medications Reviewed Today     Reviewed by Lendon Collar, RT (Technologist) on 05/05/22 at 1039  Med List Status: <None>   Medication Order Taking? Sig Documenting Provider Last Dose Status Informant  ALPRAZolam (XANAX) 0.5 MG tablet [id number] Yes Take 0.5 mg by mouth 3 (three) times daily as needed for anxiety. [provider] Taking Active Self, Pharmacy Records           Med Note Michaelle Birks, Jesusita Jocelyn A   Thu Dec 09, 2021 11:58 AM)    ARIPiprazole (ABILIFY) 5 MG tablet [id number] Yes Take 1 tablet (5 mg total) by mouth daily. Clarene Reamer, MD Taking Active Self, Pharmacy Records  aspirin-acetaminophen-caffeine [facility] MIGRAINE) [phone] MG tablet [id number] Yes Take 1 tablet by mouth every 6 (six) hours as needed for migraine. [provider] Taking Active Self, Pharmacy Records  atorvastatin (LIPITOR) 20 MG tablet [id number] Yes TAKE 1 TABLET(20 MG) BY MOUTH DAILY  Patient taking differently: Take 20 mg by mouth daily. TAKE 1 TABLET(20 MG) BY MOUTH DAILY   Libby Maw, MD Taking Active Self, Pharmacy Records  blood glucose meter kit and supplies KIT [id number] Yes Dispense based on patient and insurance preference. Check glucose before breakfast. ICD: E11.65 Nche, Charlene Brooke, NP Taking Active Self, Pharmacy Records  diclofenac Sodium (VOLTAREN) 1 % GEL [id number] Yes Apply 2 g topically daily as needed. [provider] Taking Active Self, Pharmacy Records  FINACEA 15 % FOAM [id number] Yes Apply 1 application  topically 2 (two) times daily as needed (rosacea). [provider] Taking Active Self, Pharmacy  Records           Med Note Redmond Baseman, TEQUILA   Wed Apr 08, 2020 11:05 AM) PRN  gabapentin (NEURONTIN) 300 MG capsule [id number] Yes Take 1 capsule (300 mg total) by mouth 3 (three) times daily. Libby Maw, MD Taking Active Self, Pharmacy Records  glucose blood test strip [id number] Yes Use as instructed, check glucose before breakfast. ICD: E11.65 Libby Maw, MD Taking Active Self, Pharmacy Records  lamoTRIgine (LAMICTAL) 150 MG tablet [id number] Yes Take 300 mg by mouth daily. [provider] Taking Active Self, Pharmacy Records           Med Note Michaelle Birks, Cathe Mons A   Thu Dec 09, 2021 12:08 PM) Lamotrigine 150 mg 2 tablets daily  Lancets (FREESTYLE) lancets [id number] Yes Use as instructed. E11.65 Libby Maw, MD Taking Active Self, Pharmacy Records  levothyroxine (SYNTHROID) 75 MCG tablet [id number] Yes TAKE 1 TABLET(75 MCG) BY MOUTH DAILY BEFORE BREAKFAST  Patient taking differently: Take 75 mcg by mouth daily before breakfast.   Libby Maw, MD Taking Active Self, Pharmacy Records  lisinopril (ZESTRIL) 20 MG tablet [id number] Yes Take 1 tablet (20 mg total) by mouth daily. Libby Maw, MD Taking Active Self, Pharmacy Records  metFORMIN (GLUCOPHAGE) 500 MG tablet [id number] Yes TAKE 1 TABLET(500 MG) BY MOUTH TWICE DAILY WITH A MEAL  Patient taking differently: Take 500 mg by mouth 2 (two) times daily with a meal.   Libby Maw, MD Taking Active Self, Pharmacy Records  methylphenidate (RITALIN) 20 MG tablet [id number] Yes Take 20 mg by mouth 2 (two) times daily. [provider] Taking Active Self, Pharmacy Records           Med Note Michaelle Birks, Shareta Fishbaugh A   Thu Dec 09, 2021 11:56 AM) Takes PRN  OZEMPIC, 0.25 OR 0.5 MG/DOSE, 2  MG/3ML SOPN [id number] Yes Inject 0.25 mg into the skin once a week. [provider] Taking Active Self, Pharmacy Records           Med Note Darrall Dears, [facility] N   Wed Apr 20, 2022 12:42 AM) friday  pantoprazole (PROTONIX) 40 MG tablet [id number] Yes Take 1 tablet by mouth daily. [provider] Taking Active Self, Pharmacy Records            Patient Active Problem List   Diagnosis Date Noted   Abscess 03/30/2022   Amenorrhea 10/29/2021   Abnormal findings on diagnostic imaging of liver and biliary tract 10/13/2021   Colon cancer screening 10/13/2021   Constipation 10/13/2021   Gallstones, common bile duct 10/13/2021   Gastroesophageal reflux disease 10/13/2021   History of hepatitis C 10/13/2021   Intestinal gas excretion 10/13/2021   Liver fibrosis 10/13/2021   Right upper quadrant pain 10/13/2021   Stress 06/21/2021   Need for influenza vaccination 06/21/2021   Periorbital hyperpigmentation 05/03/2021   Abnormal CBC 05/03/2021   Lower respiratory infection 02/04/2021   Hematuria 12/28/2020   Status post total replacement of left hip 10/12/2020   Type 2 diabetes mellitus with hyperglycemia, without long-term current use of insulin ([place]) 04/02/2020   Peripheral edema 04/02/2020   COVID-19 07/09/2019   Left hip pain 04/30/2019   Otitis externa of left ear 04/30/2019   Cholesteatoma of left ear 04/30/2019   Dysfunction of left eustachian tube 04/30/2019   Left ear pain 04/30/2019   Sciatica 04/19/2019   Class 3 severe obesity due to excess calories with serious comorbidity and body mass index (BMI) of 45.0 to  49.9 in adult [facility]) 04/19/2019   Post-nasal drip 01/10/2019   Allergic cough 01/10/2019   Obstructive sleep apnea syndrome 12/11/2018   Acute frontal sinusitis 12/10/2018   Primary osteoarthritis of left hip 11/13/2018   Morbid obesity ([place]) 11/13/2018   Elevated cholesterol 11/12/2018   Essential hypertension 11/12/2018   Allergic rhinitis due to  pollen 11/12/2018   Reactive airway disease 11/12/2018   Breast pain, right 07/17/2018   Prediabetes 07/17/2018   Refused influenza vaccine 07/17/2018   Chronic nonintractable headache 02/12/2018   Dysuria 02/12/2018   Localized edema 02/12/2018   Hospital discharge follow-up 01/23/2018   Bladder problem 01/05/2018   Depression 01/05/2018   Insomnia 01/05/2018   Hepatitis C infection 12/01/2015   RLS (restless legs syndrome) 10/23/2015   Elevated liver enzymes 10/22/2015   Recurrent major depression-severe ([place]) 04/17/2015   GAD (generalized anxiety disorder) 04/15/2015   Bipolar depression ([place]) 04/15/2015   Severe recurrent major depression w/psychotic features, mood-congruent ([place]) 04/14/2015   Anxiety disorder 04/14/2015   Major depressive disorder, recurrent severe without psychotic features ([place]) 04/14/2015   Suicidal ideations    Chronic LBP 01/02/2015   Adult hypothyroidism 06/25/2012   B12 deficiency 06/25/2012   Other fatigue 06/25/2012   Chronic arthralgias of knees and hips 06/25/2012   Leukocytosis 06/25/2012   Vitamin D deficiency 06/25/2012    Immunization History  Administered Date(s) Administered   Hepatitis A, Ped/Adol-2 Dose 06/17/2016, 12/22/2016   Hepatitis B, adult 06/17/2016, 08/02/2016, 12/22/2016   Influenza,inj,Quad PF,6+ Mos 06/21/2021   Influenza-Unspecified 06/07/2016, 06/04/2020   PFIZER(Purple Top)SARS-COV-2 Vaccination 12/26/2019, 01/18/2020, 07/03/2020   Pneumococcal Polysaccharide-23 10/21/2016   Tdap 10/15/2008, 02/08/2019    Conditions to be addressed/monitored:  Hypertension, Hyperlipidemia, Diabetes, Hypothyroidism, Depression, Anxiety, Allergic Rhinitis, and Bipolar Disorder  Care Plan : General Pharmacy (Adult)  Updates made by Germaine Pomfret, RPH since 06/20/2022 12:00 AM     Problem: Hypertension, Hyperlipidemia, Diabetes, Hypothyroidism, Depression, Anxiety, Allergic Rhinitis, and Bipolar Disorder   Priority: High      Long-Range Goal: Patient-Specific Goal   Start Date: 12/10/2021  Expected End Date: 12/11/2022  This Visit's Progress: On track  Recent Progress: On track  Priority: High  Note:   Current Barriers:  No barriers noted  Pharmacist Clinical Goal(s):  Patient will maintain control of diabetes as evidenced by A1c less than 7%  through collaboration with PharmD and provider.   Interventions: 1:1 collaboration with Libby Maw, MD regarding development and update of comprehensive plan of care as evidenced by provider attestation and co-signature Inter-disciplinary care team collaboration (see longitudinal plan of care) Comprehensive medication review performed; medication list updated in electronic medical record  Hypertension (BP goal <130/80) -Controlled -Current treatment: Lisinopril 20 mg daily: Appropriate, Effective, Safe, Accessible -Medications previously tried: NA  -Denies hypotensive/hypertensive symptoms -Current home readings: 120/70s  -Recommended to continue current medication  Hyperlipidemia: (LDL goal < 70) -Uncontrolled -Current treatment: Atorvastatin 20 mg daily: Appropriate, Effective, Safe, Accessible  -Medications previously tried: NA  -Current exercise habits: Minimal  -Recommended to continue current medication  Diabetes (A1c goal <7%) -Controlled -Current medications: Metformin 500 mg twice daily with meals: Appropriate, Effective, Safe, Accessible  Ozempic 0.5 mg weekly: Appropriate, Effective, Safe, Accessible  -Medications previously tried: NA  -Current home glucose readings Fasting: 101  -Denies hypoglycemic/hyperglycemic symptoms -Recommended to continue current medication  Depression/Anxiety (Goal: Maintain symptom relief) -Controlled -Managed by Stephannie Peters, FNP -Current treatment: Alprazolam 0.5 mg twice daily as needed: Appropriate, Effective, Safe, Accessible  Takes 2-3 times daily  Aripiprazole 5 mg daily: Appropriate,  Effective, Safe, Accessible  Methylphenidate 20 mg twice daily: Appropriate, Effective, Safe, Accessible  Skips PM dose  -Medications previously tried/failed: Buspar, citalopram  -Working on establishing with behavioral health at [facility]  -Recommended to continue current medication  Hypothyroidism (Goal: Maintain symptom remission) -Controlled -Current treatment  Levothyroxine 75 mcg daily: Appropriate, Effective, Safe, Accessible  -Medications previously tried: NA  -Recommended to continue current medication  GERD (Goal: Minimize symptoms) -Controlled -Current treatment  Pantoprazole 40 mg daily: Appropriate, Effective, Safe, Accessible -Medications previously tried: NA  -Counseled on trigger avoidance.  -Recommended to continue current medication  Patient Goals/Self-Care Activities Patient will:  - check glucose daily before breakfast, document, and provide at future appointments  Follow Up Plan: Telephone follow up appointment with care management team member scheduled for:  12/15/2022 at 3:00 PM    Medication Assistance: None required.  Patient affirms current coverage meets needs.  Compliance/Adherence/Medication fill history: Care Gaps: HIV Screening  Star-Rating Drugs: Metformin 500 mg last filled on 07/05/2021 90 day supply at [facility]. Ozempic 0.5 mg last filled on 10/14/2021 for 54 day supply at [facility]. Lisinopril 10 mg last filled on 10/04/2021 for 54 day supply at [facility]. Atorvastatin 20 mg last filled on 08/27/2021 for 90 day supply at [facility].  Patient's preferred pharmacy is:  [id number] [address], [address] [place] [address] Phone: [phone] Fax: [place], [address] [place] [address] Phone: [phone] Fax: [phone]  Uses pill box? Yes Pt  endorses 100% compliance  We discussed: Current pharmacy is preferred with insurance plan and patient is satisfied with pharmacy services Patient decided to: Continue current medication management strategy  Care Plan and Follow Up Patient Decision:  Patient agrees to Care Plan and Follow-up.  Plan: Telephone follow up appointment with care management team member scheduled for:  12/15/2022 at 3:00 PM  Junius Argyle, PharmD, [place], CPP Clinical Pharmacist Practitioner  [facility]  [phone]

## 2022-06-20 ENCOUNTER — Telehealth: Payer: Self-pay

## 2022-06-20 ENCOUNTER — Telehealth: Payer: Self-pay | Admitting: Family Medicine

## 2022-06-20 MED ORDER — DICLOFENAC SODIUM 1 % EX GEL: 2.0000 g | Freq: Every day | CUTANEOUS | 1 refills | Status: DC | PRN

## 2022-06-20 NOTE — Patient Instructions (Signed)
Visit Information It was great speaking with you today!  Please let me know if you have any questions about our visit.  Patient Care Plan: General Pharmacy (Adult)     Problem Identified: Hypertension, Hyperlipidemia, Diabetes, Hypothyroidism, Depression, Anxiety, Allergic Rhinitis, and Bipolar Disorder   Priority: High     Long-Range Goal: Patient-Specific Goal   Start Date: 12/10/2021  Expected End Date: 12/11/2022  This Visit's Progress: On track  Recent Progress: On track  Priority: High  Note:   Current Barriers:  No barriers noted  Pharmacist Clinical Goal(s):  Patient will maintain control of diabetes as evidenced by A1c less than 7%  through collaboration with PharmD and provider.   Interventions: 1:1 collaboration with Libby Maw, MD regarding development and update of comprehensive plan of care as evidenced by provider attestation and co-signature Inter-disciplinary care team collaboration (see longitudinal plan of care) Comprehensive medication review performed; medication list updated in electronic medical record  Hypertension (BP goal <130/80) -Controlled -Current treatment: Lisinopril 20 mg daily: Appropriate, Effective, Safe, Accessible -Medications previously tried: NA  -Denies hypotensive/hypertensive symptoms -Current home readings: 120/70s  -Recommended to continue current medication  Hyperlipidemia: (LDL goal < 70) -Uncontrolled -Current treatment: Atorvastatin 20 mg daily: Appropriate, Effective, Safe, Accessible  -Medications previously tried: NA  -Current exercise habits: Minimal  -Recommended to continue current medication  Diabetes (A1c goal <7%) -Controlled -Current medications: Metformin 500 mg twice daily with meals: Appropriate, Effective, Safe, Accessible  Ozempic 0.5 mg weekly: Appropriate, Effective, Safe, Accessible  -Medications previously tried: NA  -Current home glucose readings Fasting: 101  -Denies  hypoglycemic/hyperglycemic symptoms -Recommended to continue current medication  Depression/Anxiety (Goal: Maintain symptom relief) -Controlled -Managed by Stephannie Peters, FNP -Current treatment: Alprazolam 0.5 mg twice daily as needed: Appropriate, Effective, Safe, Accessible  Takes 2-3 times daily  Aripiprazole 5 mg daily: Appropriate, Effective, Safe, Accessible  Methylphenidate 20 mg twice daily: Appropriate, Effective, Safe, Accessible  Skips PM dose  -Medications previously tried/failed: Buspar, citalopram  -Working on establishing with behavioral health at [facility]  -Recommended to continue current medication  Hypothyroidism (Goal: Maintain symptom remission) -Controlled -Current treatment  Levothyroxine 75 mcg daily: Appropriate, Effective, Safe, Accessible  -Medications previously tried: NA  -Recommended to continue current medication  GERD (Goal: Minimize symptoms) -Controlled -Current treatment  Pantoprazole 40 mg daily: Appropriate, Effective, Safe, Accessible -Medications previously tried: NA  -Counseled on trigger avoidance.  -Recommended to continue current medication  Patient Goals/Self-Care Activities Patient will:  - check glucose daily before breakfast, document, and provide at future appointments  Follow Up Plan: Telephone follow up appointment with care management team member scheduled for:  12/15/2022 at 3:00 PM    Patient agreed to services and verbal consent obtained.   Patient verbalizes understanding of instructions and care plan provided today and agrees to view in [place]. Active MyChart status and patient understanding of how to access instructions and care plan via MyChart confirmed with patient.     Junius Argyle, PharmD, [place], CPP Clinical Pharmacist Practitioner  [facility] at [facility]  [phone]

## 2022-06-20 NOTE — Telephone Encounter (Signed)
   Telephone encounter was:  Successful.  06/20/2022 Name: [name] MRN: [id number] DOB: 05-04-1971  [name] is a 51 y.o. year old female who is a primary care patient of Libby Maw, MD . The community resource team was consulted for assistance with Transportation Needs   Care guide performed the following interventions: Spoke with patient verified home address, will mail [employer] transportation application and brochure.  Patient has my contact information if she does not receive the application.   Follow Up Plan:  No further follow up planned at this time. The patient has been provided with needed resources.  [place] Resource Care Guide   ??[email or web address]  ?? [phone]   Website: [email or web address]  [email or web address]

## 2022-06-20 NOTE — Telephone Encounter (Signed)
Caller Name: Davey Call back phone #: [phone]   MEDICATION(S):  diclofenac Sodium (VOLTAREN) 1 % GEL  Days of Med Remaining:   Has the patient contacted their pharmacy (YES/NO)? no What did pharmacy advise?   Preferred Pharmacy:  [facility] [place], [address] AT [facility] Phone:  [phone]      ~~~Please advise patient/caregiver to allow 2-3 business days to process RX refills.

## 2022-06-21 ENCOUNTER — Telehealth: Payer: Self-pay | Admitting: Family Medicine

## 2022-06-21 DIAGNOSIS — B3731 Acute candidiasis of vulva and vagina: Secondary | ICD-10-CM

## 2022-06-21 NOTE — Telephone Encounter (Signed)
Pt stated she been waiting on a pill prescription for yeast infection and she called the pharmacy yesterday and they did not receive a order yet

## 2022-06-22 MED ORDER — FLUCONAZOLE 150 MG PO TABS: 150.0000 mg | ORAL_TABLET | Freq: Once | ORAL | 0 refills | Status: AC

## 2022-06-22 NOTE — Telephone Encounter (Signed)
Patient aware and will pick up Rx. Will call GYN for follow up.

## 2022-06-22 NOTE — Telephone Encounter (Signed)
Spoke with patient who states she have started with a yeast infection would like something sent in for this. Please advice.

## 2022-06-24 ENCOUNTER — Other Ambulatory Visit: Payer: Self-pay | Admitting: Family Medicine

## 2022-06-24 DIAGNOSIS — E1165 Type 2 diabetes mellitus with hyperglycemia: Secondary | ICD-10-CM

## 2022-06-28 ENCOUNTER — Other Ambulatory Visit: Payer: Self-pay | Admitting: Family Medicine

## 2022-07-05 DIAGNOSIS — E1159 Type 2 diabetes mellitus with other circulatory complications: Secondary | ICD-10-CM

## 2022-07-05 DIAGNOSIS — Z7985 Long-term (current) use of injectable non-insulin antidiabetic drugs: Secondary | ICD-10-CM

## 2022-07-05 DIAGNOSIS — E785 Hyperlipidemia, unspecified: Secondary | ICD-10-CM | POA: Diagnosis not present

## 2022-07-05 DIAGNOSIS — E039 Hypothyroidism, unspecified: Secondary | ICD-10-CM | POA: Diagnosis not present

## 2022-07-05 DIAGNOSIS — I10 Essential (primary) hypertension: Secondary | ICD-10-CM

## 2022-07-05 DIAGNOSIS — F32A Depression, unspecified: Secondary | ICD-10-CM | POA: Diagnosis not present

## 2022-07-05 DIAGNOSIS — Z7984 Long term (current) use of oral hypoglycemic drugs: Secondary | ICD-10-CM

## 2022-07-20 ENCOUNTER — Ambulatory Visit: Payer: PPO | Admitting: Advanced Practice Midwife

## 2022-07-21 DIAGNOSIS — F909 Attention-deficit hyperactivity disorder, unspecified type: Secondary | ICD-10-CM | POA: Diagnosis not present

## 2022-07-21 DIAGNOSIS — F429 Obsessive-compulsive disorder, unspecified: Secondary | ICD-10-CM | POA: Diagnosis not present

## 2022-07-21 DIAGNOSIS — F3132 Bipolar disorder, current episode depressed, moderate: Secondary | ICD-10-CM | POA: Diagnosis not present

## 2022-07-21 DIAGNOSIS — F411 Generalized anxiety disorder: Secondary | ICD-10-CM | POA: Diagnosis not present

## 2022-07-29 ENCOUNTER — Other Ambulatory Visit: Payer: Self-pay | Admitting: Family Medicine

## 2022-07-29 DIAGNOSIS — G8929 Other chronic pain: Secondary | ICD-10-CM

## 2022-07-29 DIAGNOSIS — G2581 Restless legs syndrome: Secondary | ICD-10-CM

## 2022-08-06 ENCOUNTER — Other Ambulatory Visit: Payer: Self-pay

## 2022-08-06 ENCOUNTER — Emergency Department (HOSPITAL_COMMUNITY)
Admission: EM | Admit: 2022-08-06 | Discharge: 2022-08-06 | Disposition: A | Discharge status: Home / Self Care | Payer: PPO | Attending: Emergency Medicine | Admitting: Emergency Medicine

## 2022-08-06 ENCOUNTER — Emergency Department (HOSPITAL_COMMUNITY): Payer: PPO

## 2022-08-06 DIAGNOSIS — Z7984 Long term (current) use of oral hypoglycemic drugs: Secondary | ICD-10-CM | POA: Insufficient documentation

## 2022-08-06 DIAGNOSIS — W450XXA Nail entering through skin, initial encounter: Secondary | ICD-10-CM | POA: Diagnosis not present

## 2022-08-06 DIAGNOSIS — Z23 Encounter for immunization: Secondary | ICD-10-CM | POA: Insufficient documentation

## 2022-08-06 DIAGNOSIS — M19071 Primary osteoarthritis, right ankle and foot: Secondary | ICD-10-CM | POA: Diagnosis not present

## 2022-08-06 DIAGNOSIS — E119 Type 2 diabetes mellitus without complications: Secondary | ICD-10-CM | POA: Insufficient documentation

## 2022-08-06 DIAGNOSIS — S91331A Puncture wound without foreign body, right foot, initial encounter: Secondary | ICD-10-CM | POA: Insufficient documentation

## 2022-08-06 DIAGNOSIS — S99921A Unspecified injury of right foot, initial encounter: Secondary | ICD-10-CM | POA: Diagnosis not present

## 2022-08-06 DIAGNOSIS — M79671 Pain in right foot: Secondary | ICD-10-CM | POA: Diagnosis present

## 2022-08-06 MED ORDER — TETANUS-DIPHTH-ACELL PERTUSSIS 5-2.5-18.5 LF-MCG/0.5 IM SUSY
0.5000 mL | PREFILLED_SYRINGE | Freq: Once | INTRAMUSCULAR | Status: AC
  Administered 2022-08-06: 0.5 mL via INTRAMUSCULAR
  Filled 2022-08-06: qty 0.5

## 2022-08-06 MED ORDER — CEPHALEXIN 500 MG PO CAPS: 500.0000 mg | ORAL_CAPSULE | Freq: Four times a day (QID) | ORAL | 0 refills | Status: DC

## 2022-08-06 NOTE — ED Provider Notes (Signed)
[facility] DEPT Provider Note   CSN: [id number] Arrival date & time: 08/06/22  1114     History  Chief Complaint  Patient presents with   Foot Injury    Glenetta L Pinckney is a 51 y.o. female reports to ER complaining of right foot pain after stepping on a nail yesterday.  She states she is getting a new roof and there are nails all over the ground.  She reports the nail was rusty and she has not had a recent tetanus shot and would like that updated today.  She is diabetic but reports that she does not have any issues with wound healing.  Denies bleeding or abnormal discharge from the wound today, reports that is sore to the touch but she is able to walk on it.        Home Medications Prior to Admission medications   Medication Sig Start Date End Date Taking? Authorizing Provider  cephALEXin (KEFLEX) 500 MG capsule Take 1 capsule (500 mg total) by mouth 4 (four) times daily. 08/06/22  Yes Sally Reimers R, PA  ALPRAZolam (XANAX) 0.5 MG tablet Take 0.5 mg by mouth 3 (three) times daily as needed for anxiety. 12/20/19   [provider]  ARIPiprazole (ABILIFY) 5 MG tablet Take 1 tablet (5 mg total) by mouth daily. 09/17/15   Clarene Reamer, MD  aspirin-acetaminophen-caffeine (EXCEDRIN MIGRAINE) [phone] MG tablet Take 1 tablet by mouth every 6 (six) hours as needed for migraine.    [provider]  atorvastatin (LIPITOR) 20 MG tablet TAKE 1 TABLET(20 MG) BY MOUTH DAILY Patient taking differently: Take 20 mg by mouth daily. TAKE 1 TABLET(20 MG) BY MOUTH DAILY 08/27/21   Libby Maw, MD  blood glucose meter kit and supplies KIT Dispense based on patient and insurance preference. Check glucose before breakfast. ICD: E11.65 04/02/20   Nche, Charlene Brooke, NP  diclofenac Sodium (VOLTAREN) 1 % GEL Apply 2 g topically daily as needed. 06/20/22   Libby Maw, MD  FINACEA 15 % FOAM Apply 1 application  topically 2 (two) times daily  as needed (rosacea). 02/14/20   [provider]  gabapentin (NEURONTIN) 300 MG capsule TAKE 1 CAPSULE(300 MG) BY MOUTH THREE TIMES DAILY 07/31/22   Libby Maw, MD  glucose blood test strip Use as instructed, check glucose before breakfast. ICD: E11.65 07/21/20   Libby Maw, MD  lamoTRIgine (LAMICTAL) 150 MG tablet Take 300 mg by mouth daily. 02/22/20   [provider]  Lancets (FREESTYLE) lancets Use as instructed. E11.65 07/21/20   Libby Maw, MD  levothyroxine (SYNTHROID) 75 MCG tablet TAKE 1 TABLET(75 MCG) BY MOUTH DAILY BEFORE BREAKFAST Patient taking differently: Take 75 mcg by mouth daily before breakfast. 02/25/22   Libby Maw, MD  lisinopril (ZESTRIL) 20 MG tablet Take 1 tablet (20 mg total) by mouth daily. 04/19/22   Libby Maw, MD  metFORMIN (GLUCOPHAGE) 500 MG tablet TAKE 1 TABLET(500 MG) BY MOUTH TWICE DAILY WITH A MEAL 06/24/22   Libby Maw, MD  methylphenidate (RITALIN) 20 MG tablet Take 20 mg by mouth 2 (two) times daily. 09/22/20   [provider]  OZEMPIC, 0.25 OR 0.5 MG/DOSE, 2 MG/3ML SOPN INJECT 0.25 MG SUBCUTANEOUSLY EVERY THURSDAY 06/28/22   Libby Maw, MD  pantoprazole (PROTONIX) 40 MG tablet Take 1 tablet by mouth daily. 01/06/21   [provider]      Allergies    Pregabalin, Bupropion hcl, Celecoxib, Effexor [  venlafaxine hcl], Oxybutynin, Pramipexole, Pramipexole dihydrochloride, Pregabalin, Prozac [fluoxetine hcl], Symbyax [olanzapine-fluoxetine hcl], Venlafaxine, and Desvenlafaxine    Review of Systems   Review of Systems  Musculoskeletal:        Foot pain  Skin:  Positive for wound.    Physical Exam Updated Vital Signs BP 113/69 (BP Location: Right Arm)   Pulse 67   Temp 97.7 F (36.5 C) (Oral)   Resp 14   Ht _0  (1.626 m)   Wt 74 kg   SpO2 98%   BMI 28.00 kg/m  Physical Exam Vitals and nursing note reviewed.  Constitutional:       General: She is not in acute distress.    Appearance: Normal appearance. She is not ill-appearing or diaphoretic.  Pulmonary:     Effort: Pulmonary effort is normal.  Musculoskeletal:       Feet:  Feet:     Left foot:     Skin integrity: Erythema present. No ulcer, warmth or dry skin.     Comments: Tenderness with minor puncture injury to planter surface of right foot, no bleeding, discharge, or profound erythema surrounding wound  Neurological:     Mental Status: She is alert. Mental status is at baseline.  Psychiatric:        Mood and Affect: Mood normal.        Behavior: Behavior normal.     ED Results / Procedures / Treatments   Labs (all labs ordered are listed, but only abnormal results are displayed) Labs Reviewed - No data to display  EKG None  Radiology DG Foot Complete Right  Result Date: 08/06/2022 CLINICAL DATA:  Stepped on a nail. The pain along the plantar surface of the foot. EXAM: RIGHT FOOT COMPLETE - 3+ VIEW COMPARISON:  None Available. FINDINGS: Mild degenerative changes are present the midfoot and at the ankle. No acute bone or soft tissue abnormalities are present. No radiopaque foreign body present. No residual nail. IMPRESSION: 1. No acute abnormality or radiopaque foreign body. 2. Mild degenerative changes in the midfoot and ankle. Electronically Signed   By: San Morelle M.D.   On: 08/06/2022 12:16    Procedures Procedures    Medications Ordered in ED Medications  Tdap (BOOSTRIX) injection 0.5 mL (0.5 mLs Intramuscular Given 08/06/22 1452)    ED Course/ Medical Decision Making/ A&P                           Medical Decision Making Amount and/or Complexity of Data Reviewed Radiology: ordered.  Risk Prescription drug management.   This patient presents to the ED with chief complaint(s) of right foot injury from a rusty nail with pertinent past medical history of diabetes, well controlled per patient .The complaint involves an extensive  differential diagnosis and also carries with it a high risk of complications and morbidity.    The differential diagnosis includes foreign body in foot, bony injury to foot, puncture wound   The initial plan is to obtain x-ray of foot to r/o foreign body or bony injury  Initial Assessment:   Patient was ambulatory in triage without difficulty.  She has tenderness with a pinpoint puncture wound to the right plantar surface, minimal erythema.  No bleeding or discharge.  No other abnormalities or injuries on exam.  Sensation and motor function intact.   Independent ECG/labs interpretation:  None, I do not feel that laboratory work up is indicated, appears to be a simple injury  Independent visualization and interpretation of imaging: I independently visualized the following imaging with scope of interpretation limited to determining acute life threatening conditions related to emergency care: right foot, which revealed no retained foreign body or bony injury; some degenerative changes present  Treatment and Reassessment: Updated patient's tetanus while in ED, will also send patient home on antibiotics given injury and history of diabetes.   Disposition:   After consideration of the diagnostic results and the patients response to treatment, I feel that emergency department workup does not suggest an emergent condition requiring admission or immediate intervention beyond what has been performed at this time.  The patient is safe for discharge and has been instructed to return immediately for worsening symptoms, change in symptoms or any other concerns.  Will send patient home on antibiotic prophylaxis given nature of injury and history of diabetes.  Discussed with patient warning signs of infection to look out for.  Patient states her understanding and agrees with discharge plan.          Final Clinical Impression(s) / ED Diagnoses Final diagnoses:  Puncture wound of right foot, initial  encounter    Rx / DC Orders ED Discharge Orders          Ordered    cephALEXin (KEFLEX) 500 MG capsule  4 times daily        08/06/22 1448              Theressa Stamps R, [place] 08/06/22 1457    Audley Hose, MD 08/08/22 [phone]

## 2022-08-06 NOTE — ED Provider Triage Note (Signed)
Emergency Medicine Provider Triage Evaluation Note  [name] , a 51 y.o. female  was evaluated in triage.  Pt complains of right foot pain after stepping on a nail yesterday.  She states she is getting a new roof and there are nails all over the ground.  She reports the nail was rusty and she has not had a recent tetanus shot.  She is diabetic but reports that she does not have any issues with wound healing.  Denies bleeding or abnormal discharge from the wound today.  Review of Systems  Positive: See above Negative: See above  Physical Exam  BP 108/72   Pulse 70   Temp 98 F (36.7 C)   Resp 20   Ht '5\' 4"'$  (1.626 m)   Wt 74 kg   SpO2 100%   BMI 28.00 kg/m  Gen:   Awake, no distress   Resp:  Normal effort  MSK:   Moves extremities without difficulty  Other:  Tenderness to the sole of the right foot, no increased erythema or warmth, no bleeding, no discharge  Medical Decision Making  Medically screening exam initiated at 11:54 AM.  Appropriate orders placed.  [name] was informed that the remainder of the evaluation will be completed by another provider, this initial triage assessment does not replace that evaluation, and the importance of remaining in the ED until their evaluation is complete.     Theressa Stamps R, [place] 08/06/22 1156

## 2022-08-06 NOTE — Discharge Instructions (Signed)
Thank you for allowing me to be part of your care.  Your x-ray was reassuring and showed no foreign body or bone injury.  Please take antibiotic as prescribed to help prevent infection.   Please monitor your wound for signs of infection and follow-up with your primary care physician if you have concerns or develop changes to your wound.

## 2022-08-06 NOTE — ED Triage Notes (Signed)
Pt reports stepping on rusty nail yesterday and Tetanus not up to date.

## 2022-09-21 ENCOUNTER — Telehealth: Payer: Self-pay

## 2022-09-21 NOTE — Progress Notes (Signed)
Care Management & Coordination Services Pharmacy Team  Reason for Encounter: Hypertension  Contacted patient on 09/21/2022 to discuss hypertension disease state.   Recent office visits:  None ID  Recent consult visits:  None ID  Hospital visits:  Medication Reconciliation was completed by comparing discharge summary, patient's EMR and Pharmacy list, and upon discussion with patient.  Admitted to the hospital on 08/06/2022 due to Puncture wound of right foot . Discharge date was 08/06/2022. Discharged from 08/06/2022 [facility]?Medications Started at [facility] Discharge:?? -started cephalexin 500 MG 4 times daily  Medication Changes at Hospital Discharge: -Changed None ID  Medications Discontinued at Hospital Discharge: -Stopped None ID  Medications that remain the same after Hospital Discharge:??  -All other medications will remain the same.    Medications: Outpatient Encounter Medications as of 09/21/2022  Medication Sig Note   ALPRAZolam (XANAX) 0.5 MG tablet Take 0.5 mg by mouth 3 (three) times daily as needed for anxiety.    ARIPiprazole (ABILIFY) 5 MG tablet Take 1 tablet (5 mg total) by mouth daily.    aspirin-acetaminophen-caffeine (EXCEDRIN MIGRAINE) 250-250-65 MG tablet Take 1 tablet by mouth every 6 (six) hours as needed for migraine.    atorvastatin (LIPITOR) 20 MG tablet TAKE 1 TABLET(20 MG) BY MOUTH DAILY (Patient taking differently: Take 20 mg by mouth daily. TAKE 1 TABLET(20 MG) BY MOUTH DAILY)    blood glucose meter kit and supplies KIT Dispense based on patient and insurance preference. Check glucose before breakfast. ICD: E11.65    cephALEXin (KEFLEX) 500 MG capsule Take 1 capsule (500 mg total) by mouth 4 (four) times daily.    diclofenac Sodium (VOLTAREN) 1 % GEL Apply 2 g topically daily as needed.    FINACEA 15 % FOAM Apply 1 application  topically 2 (two) times daily as needed (rosacea). 04/08/2020: PRN   gabapentin (NEURONTIN) 300 MG capsule TAKE 1  CAPSULE(300 MG) BY MOUTH THREE TIMES DAILY    glucose blood test strip Use as instructed, check glucose before breakfast. ICD: E11.65    lamoTRIgine (LAMICTAL) 150 MG tablet Take 300 mg by mouth daily. 12/09/2021: Lamotrigine 150 mg 2 tablets daily   Lancets (FREESTYLE) lancets Use as instructed. E11.65    levothyroxine (SYNTHROID) 75 MCG tablet TAKE 1 TABLET(75 MCG) BY MOUTH DAILY BEFORE BREAKFAST (Patient taking differently: Take 75 mcg by mouth daily before breakfast.)    lisinopril (ZESTRIL) 20 MG tablet Take 1 tablet (20 mg total) by mouth daily.    metFORMIN (GLUCOPHAGE) 500 MG tablet TAKE 1 TABLET(500 MG) BY MOUTH TWICE DAILY WITH A MEAL    methylphenidate (RITALIN) 20 MG tablet Take 20 mg by mouth 2 (two) times daily. 12/09/2021: Takes PRN   OZEMPIC, 0.25 OR 0.5 MG/DOSE, 2 MG/3ML SOPN INJECT 0.25 MG SUBCUTANEOUSLY EVERY THURSDAY    pantoprazole (PROTONIX) 40 MG tablet Take 1 tablet by mouth daily.    No facility-administered encounter medications on file as of 09/21/2022.    Recent Office Vitals: BP Readings from Last 3 Encounters:  08/06/22 113/69  05/05/22 118/79  04/20/22 134/65   Pulse Readings from Last 3 Encounters:  08/06/22 67  05/05/22 71  04/20/22 64    Wt Readings from Last 3 Encounters:  08/06/22 163 lb 2.3 oz (74 kg)  04/18/22 164 lb (74.4 kg)  03/30/22 178 lb (80.7 kg)     Kidney Function Lab Results  Component Value Date/Time   CREATININE 0.72 04/19/2022 09:21 PM   CREATININE 0.66 04/18/2022 02:38 PM  CREATININE 0.84 03/01/2019 11:18 AM   GFR 102.31 10/29/2021 11:25 AM   GFRNONAA >60 04/19/2022 09:21 PM   GFRNONAA >60 03/01/2019 11:18 AM   GFRAA >60 12/30/2019 02:12 AM   GFRAA >60 03/01/2019 11:18 AM       Latest Ref Rng & Units 04/19/2022    9:21 PM 04/18/2022    2:38 PM 10/29/2021   11:25 AM  BMP  Glucose 70 - 99 mg/dL 102  92  91   BUN 6 - 20 mg/dL '12  13  9   '$ Creatinine 0.44 - 1.00 mg/dL 0.72  0.66  0.69   Sodium 135 - 145 mmol/L 139  138  139    Potassium 3.5 - 5.1 mmol/L 3.8  3.9  3.9   Chloride 98 - 111 mmol/L 109  106  102   CO2 22 - 32 mmol/L 22  22  35   Calcium 8.9 - 10.3 mg/dL 9.6  9.5  9.7      Current antihypertensive regimen:  Lisinopril 20 mg daily    Patient verbally confirms she is taking the above medications as directed. Yes  How often are you checking your Blood Pressure? 3-5x per week  she checks her blood pressure in the morning after taking her medication.  Current home BP readings:   Patient states her blood pressure ranges around 110/64.    Wrist or arm cuff: Patient reports she use a arm cuff.  Caffeine intake: Patient states she tries to limit her caffeine intake.  Salt intake: Patient states she limits her salt intake.   Any readings above 180/120? No   What recent interventions/DTPs have been made by any provider to improve Blood Pressure control since last CPP Visit: None ID  Any recent hospitalizations or ED visits since last visit with CPP? Yes  What diet changes have been made to improve Blood Pressure Control?  None ID  What exercise is being done to improve your Blood Pressure Control?  Patient states she does not exercise at this time.  Adherence Review: Is the patient currently on ACE/ARB medication? Yes Does the patient have >5 day gap between last estimated fill dates? No  Star Rating Drugs:  Medication: Atorvastatin 20 mg  Last Fill: 08/22/2022 Day Supply 40 at [facility]. Medication: Lisinopril 20 mg  Last Fill: 08/22/2022 Day Supply 90 at [place]. Medication: Metformin 500 mg  Last Fill: 08/22/2022 Day Supply 90 at [facility].  Care Gaps: HIV Screening Diabetic Kidney Evaluation - Urine ACR Influenza Vaccine Ophthalmology Exam   [facility] Clinical Pharmacist Assistant [phone]

## 2022-09-23 DIAGNOSIS — F429 Obsessive-compulsive disorder, unspecified: Secondary | ICD-10-CM | POA: Diagnosis not present

## 2022-09-23 DIAGNOSIS — F411 Generalized anxiety disorder: Secondary | ICD-10-CM | POA: Diagnosis not present

## 2022-09-23 DIAGNOSIS — F909 Attention-deficit hyperactivity disorder, unspecified type: Secondary | ICD-10-CM | POA: Diagnosis not present

## 2022-09-23 DIAGNOSIS — F3132 Bipolar disorder, current episode depressed, moderate: Secondary | ICD-10-CM | POA: Diagnosis not present

## 2022-09-26 ENCOUNTER — Other Ambulatory Visit: Payer: Self-pay | Admitting: Family Medicine

## 2022-09-26 DIAGNOSIS — E1165 Type 2 diabetes mellitus with hyperglycemia: Secondary | ICD-10-CM

## 2022-10-01 ENCOUNTER — Other Ambulatory Visit: Payer: Self-pay | Admitting: Family Medicine

## 2022-10-01 DIAGNOSIS — E7800 Pure hypercholesterolemia, unspecified: Secondary | ICD-10-CM

## 2022-10-14 ENCOUNTER — Ambulatory Visit (INDEPENDENT_AMBULATORY_CARE_PROVIDER_SITE_OTHER): Payer: PPO

## 2022-10-14 ENCOUNTER — Telehealth: Payer: Self-pay

## 2022-10-14 VITALS — Ht 64.0 in | Wt 159.0 lb

## 2022-10-14 DIAGNOSIS — Z0000 Encounter for general adult medical examination without abnormal findings: Secondary | ICD-10-CM | POA: Diagnosis not present

## 2022-10-14 NOTE — Patient Instructions (Signed)
Ms. Dasgupta , Thank you for taking time to come for your Medicare Wellness Visit. I appreciate your ongoing commitment to your health goals. Please review the following plan we discussed and let me know if I can assist you in the future.   These are the goals we discussed:  Goals      Increase physical activity     Monitor and Manage My Blood Sugar-Diabetes Type 2     Timeframe:  Long-Range Goal Priority:  High Start Date: 12/10/21                            Expected End Date: 12/11/22                      Follow Up within 90 days    - check blood sugar at prescribed times - check blood sugar if I feel it is too high or too low - enter blood sugar readings and medication or insulin into daily log - take the blood sugar log to all doctor visits    Why is this important?   Checking your blood sugar at home helps to keep it from getting very high or very low.  Writing the results in a diary or log helps the doctor know how to care for you.  Your blood sugar log should have the time, date and the results.  Also, write down the amount of insulin or other medicine that you take.  Other information, like what you ate, exercise done and how you were feeling, will also be helpful.     Notes:      Patient Stated     Continue weight loss & quit smoking     Patient Stated     10/14/2022, wants to get tone        This is a list of the screening recommended for you and due dates:  Health Maintenance  Topic Date Due   HIV Screening  Never done   Yearly kidney health urinalysis for diabetes  12/16/2021   Flu Shot  04/05/2022   Eye exam for diabetics  07/13/2022   Colon Cancer Screening  02/15/2023*   Hemoglobin A1C  10/21/2022   Complete foot exam   10/29/2022   Pap Smear  04/16/2023   Yearly kidney function blood test for diabetes  04/20/2023   Medicare Annual Wellness Visit  10/15/2023   DTaP/Tdap/Td vaccine (4 - Td or Tdap) 08/06/2032   Hepatitis C Screening: USPSTF Recommendation  to screen - Ages 49-79 yo.  Completed   HPV Vaccine  Aged Out   COVID-19 Vaccine  Discontinued  *Topic was postponed. The date shown is not the original due date.    Advanced directives: Advance directive discussed with you today.   Conditions/risks identified: none  Next appointment: Follow up in one year for your annual wellness visit.   Preventive Care 40-64 Years, Female Preventive care refers to lifestyle choices and visits with your health care provider that can promote health and wellness. What does preventive care include? A yearly physical exam. This is also called an annual well check. Dental exams once or twice a year. Routine eye exams. Ask your health care provider how often you should have your eyes checked. Personal lifestyle choices, including: Daily care of your teeth and gums. Regular physical activity. Eating a healthy diet. Avoiding tobacco and drug use. Limiting alcohol use. Practicing safe sex. Taking low-dose aspirin daily  starting at age 21. Taking vitamin and mineral supplements as recommended by your health care provider. What happens during an annual well check? The services and screenings done by your health care provider during your annual well check will depend on your age, overall health, lifestyle risk factors, and family history of disease. Counseling  Your health care provider may ask you questions about your: Alcohol use. Tobacco use. Drug use. Emotional well-being. Home and relationship well-being. Sexual activity. Eating habits. Work and work Statistician. Method of birth control. Menstrual cycle. Pregnancy history. Screening  You may have the following tests or measurements: Height, weight, and BMI. Blood pressure. Lipid and cholesterol levels. These may be checked every 5 years, or more frequently if you are over 35 years old. Skin check. Lung cancer screening. You may have this screening every year starting at age 40 if you have a  30-pack-year history of smoking and currently smoke or have quit within the past 15 years. Fecal occult blood test (FOBT) of the stool. You may have this test every year starting at age 40. Flexible sigmoidoscopy or colonoscopy. You may have a sigmoidoscopy every 5 years or a colonoscopy every 10 years starting at age 46. Hepatitis C blood test. Hepatitis B blood test. Sexually transmitted disease (STD) testing. Diabetes screening. This is done by checking your blood sugar (glucose) after you have not eaten for a while (fasting). You may have this done every 1-3 years. Mammogram. This may be done every 1-2 years. Talk to your health care provider about when you should start having regular mammograms. This may depend on whether you have a family history of breast cancer. BRCA-related cancer screening. This may be done if you have a family history of breast, ovarian, tubal, or peritoneal cancers. Pelvic exam and Pap test. This may be done every 3 years starting at age 81. Starting at age 23, this may be done every 5 years if you have a Pap test in combination with an HPV test. Bone density scan. This is done to screen for osteoporosis. You may have this scan if you are at high risk for osteoporosis. Discuss your test results, treatment options, and if necessary, the need for more tests with your health care provider. Vaccines  Your health care provider may recommend certain vaccines, such as: Influenza vaccine. This is recommended every year. Tetanus, diphtheria, and acellular pertussis (Tdap, Td) vaccine. You may need a Td booster every 10 years. Zoster vaccine. You may need this after age 56. Pneumococcal 13-valent conjugate (PCV13) vaccine. You may need this if you have certain conditions and were not previously vaccinated. Pneumococcal polysaccharide (PPSV23) vaccine. You may need one or two doses if you smoke cigarettes or if you have certain conditions. Talk to your health care provider about  which screenings and vaccines you need and how often you need them. This information is not intended to replace advice given to you by your health care provider. Make sure you discuss any questions you have with your health care provider. Document Released: 09/18/2015 Document Revised: 05/11/2016 Document Reviewed: 06/23/2015 Elsevier Interactive Patient Education  2017 [place] Prevention in the Home Falls can cause injuries. They can happen to people of all ages. There are many things you can do to make your home safe and to help prevent falls. What can I do on the outside of my home? Regularly fix the edges of walkways and driveways and fix any cracks. Remove anything that might make you  trip as you walk through a door, such as a raised step or threshold. Trim any bushes or trees on the path to your home. Use bright outdoor lighting. Clear any walking paths of anything that might make someone trip, such as rocks or tools. Regularly check to see if handrails are loose or broken. Make sure that both sides of any steps have handrails. Any raised decks and porches should have guardrails on the edges. Have any leaves, snow, or ice cleared regularly. Use sand or salt on walking paths during winter. Clean up any spills in your garage right away. This includes oil or grease spills. What can I do in the bathroom? Use night lights. Install grab bars by the toilet and in the tub and shower. Do not use towel bars as grab bars. Use non-skid mats or decals in the tub or shower. If you need to sit down in the shower, use a plastic, non-slip stool. Keep the floor dry. Clean up any water that spills on the floor as soon as it happens. Remove soap buildup in the tub or shower regularly. Attach bath mats securely with double-sided non-slip rug tape. Do not have throw rugs and other things on the floor that can make you trip. What can I do in the bedroom? Use night lights. Make sure that  you have a light by your bed that is easy to reach. Do not use any sheets or blankets that are too big for your bed. They should not hang down onto the floor. Have a firm chair that has side arms. You can use this for support while you get dressed. Do not have throw rugs and other things on the floor that can make you trip. What can I do in the kitchen? Clean up any spills right away. Avoid walking on wet floors. Keep items that you use a lot in easy-to-reach places. If you need to reach something above you, use a strong step stool that has a grab bar. Keep electrical cords out of the way. Do not use floor polish or wax that makes floors slippery. If you must use wax, use non-skid floor wax. Do not have throw rugs and other things on the floor that can make you trip. What can I do with my stairs? Do not leave any items on the stairs. Make sure that there are handrails on both sides of the stairs and use them. Fix handrails that are broken or loose. Make sure that handrails are as long as the stairways. Check any carpeting to make sure that it is firmly attached to the stairs. Fix any carpet that is loose or worn. Avoid having throw rugs at the top or bottom of the stairs. If you do have throw rugs, attach them to the floor with carpet tape. Make sure that you have a light switch at the top of the stairs and the bottom of the stairs. If you do not have them, ask someone to add them for you. What else can I do to help prevent falls? Wear shoes that: Do not have high heels. Have rubber bottoms. Are comfortable and fit you well. Are closed at the toe. Do not wear sandals. If you use a stepladder: Make sure that it is fully opened. Do not climb a closed stepladder. Make sure that both sides of the stepladder are locked into place. Ask someone to hold it for you, if possible. Clearly mark and make sure that you can see: Any grab bars or  handrails. First and last steps. Where the edge of each  step is. Use tools that help you move around (mobility aids) if they are needed. These include: Canes. Walkers. Scooters. Crutches. Turn on the lights when you go into a dark area. Replace any light bulbs as soon as they burn out. Set up your furniture so you have a clear path. Avoid moving your furniture around. If any of your floors are uneven, fix them. If there are any pets around you, be aware of where they are. Review your medicines with your doctor. Some medicines can make you feel dizzy. This can increase your chance of falling. Ask your doctor what other things that you can do to help prevent falls. This information is not intended to replace advice given to you by your health care provider. Make sure you discuss any questions you have with your health care provider. Document Released: 06/18/2009 Document Revised: 01/28/2016 Document Reviewed: 09/26/2014 Elsevier Interactive Patient Education  2017 [employer].

## 2022-10-14 NOTE — Telephone Encounter (Signed)
   Telephone encounter was:  Unsuccessful.  10/14/2022 Name: CAITLAN CHAUCA MRN: [id number] DOB: April 25, 1971  Unsuccessful outbound call made today to assist with:  Transportation Needs  and Financial Difficulties related to Financial strain  [employer] Attempt:  1st Attempt  A HIPAA compliant voice message was left requesting a return call.  Instructed patient to call back .    [facility] [phone] [address], [place], [place] [address] Phone: [phone] Email: [email or web address]

## 2022-10-14 NOTE — Progress Notes (Signed)
I connected with [name] today by telephone and verified that I am speaking with the correct person using two identifiers. Location patient: home Location provider: work Persons participating in the virtual visit: [name], [place] LPN.   I discussed the limitations, risks, security and privacy concerns of performing an evaluation and management service by telephone and the availability of in person appointments. I also discussed with the patient that there may be a patient responsible charge related to this service. The patient expressed understanding and verbally consented to this telephonic visit.    Interactive audio and video telecommunications were attempted between this provider and patient, however failed, due to patient having technical difficulties OR patient did not have access to video capability.  We continued and completed visit with audio only.     Vital signs may be patient reported or missing.  Subjective:   [name] is a 52 y.o. female who presents for Medicare Annual (Subsequent) preventive examination.  Review of Systems     Cardiac Risk Factors include: diabetes mellitus;hypertension     Objective:    Today's Vitals   10/14/22 1026  Weight: 159 lb (72.1 kg)  Height: 5' 4"$  (1.626 m)   Body mass index is 27.29 kg/m.     10/14/2022   10:33 AM 08/06/2022   11:41 AM 04/19/2022    8:59 PM 04/18/2022    2:31 PM 10/13/2021    1:19 PM 01/30/2021   11:04 PM 10/12/2020    6:01 AM  Advanced Directives  Does Patient Have a Medical Advance Directive? No No No No No No No  Would patient like information on creating a medical advance directive?  No - Patient declined No - Patient declined No - Patient declined No - Patient declined  No - Patient declined    Current Medications (verified) Outpatient Encounter Medications as of 10/14/2022  Medication Sig   ALPRAZolam (XANAX) 0.5 MG tablet Take 0.5 mg by mouth 3 (three) times daily as needed for anxiety.    ARIPiprazole (ABILIFY) 5 MG tablet Take 1 tablet (5 mg total) by mouth daily.   aspirin-acetaminophen-caffeine (EXCEDRIN MIGRAINE) 250-250-65 MG tablet Take 1 tablet by mouth every 6 (six) hours as needed for migraine.   atorvastatin (LIPITOR) 20 MG tablet TAKE 1 TABLET(20 MG) BY MOUTH DAILY   blood glucose meter kit and supplies KIT Dispense based on patient and insurance preference. Check glucose before breakfast. ICD: E11.65   diclofenac Sodium (VOLTAREN) 1 % GEL Apply 2 g topically daily as needed.   FINACEA 15 % FOAM Apply 1 application  topically 2 (two) times daily as needed (rosacea).   gabapentin (NEURONTIN) 300 MG capsule TAKE 1 CAPSULE(300 MG) BY MOUTH THREE TIMES DAILY   glucose blood test strip Use as instructed, check glucose before breakfast. ICD: E11.65   lamoTRIgine (LAMICTAL) 150 MG tablet Take 300 mg by mouth daily.   Lancets (FREESTYLE) lancets Use as instructed. E11.65   levothyroxine (SYNTHROID) 75 MCG tablet TAKE 1 TABLET(75 MCG) BY MOUTH DAILY BEFORE BREAKFAST (Patient taking differently: Take 75 mcg by mouth daily before breakfast.)   lisinopril (ZESTRIL) 20 MG tablet Take 1 tablet (20 mg total) by mouth daily.   metFORMIN (GLUCOPHAGE) 500 MG tablet TAKE 1 TABLET(500 MG) BY MOUTH TWICE DAILY WITH A MEAL   methylphenidate (RITALIN) 20 MG tablet Take 20 mg by mouth 2 (two) times daily.   OZEMPIC, 0.25 OR 0.5 MG/DOSE, 2 MG/3ML SOPN INJECT 0.25 MG SUBCUTANEOUSLY EVERY THURSDAY   pantoprazole (  PROTONIX) 40 MG tablet Take 1 tablet by mouth daily.   cephALEXin (KEFLEX) 500 MG capsule Take 1 capsule (500 mg total) by mouth 4 (four) times daily. (Patient not taking: Reported on 10/14/2022)   No facility-administered encounter medications on file as of 10/14/2022.    Allergies (verified) Pregabalin, Bupropion hcl, Celecoxib, Effexor [venlafaxine hcl], Oxybutynin, Pramipexole, Pramipexole dihydrochloride, Pregabalin, Prozac [fluoxetine hcl], Symbyax [olanzapine-fluoxetine hcl],  Venlafaxine, and Desvenlafaxine   History: Past Medical History:  Diagnosis Date   ADHD (attention deficit hyperactivity disorder)    Anemia    with pregnancy   Anxiety    Arthritis    Bipolar disorder (HCC)    Chronic back pain greater than 3 months duration    Depression    Diabetes mellitus without complication ([place])    Diabetic nephropathy ([place])    bilateral feet   Dysuria    has to have self in and out cath    Eroded bladder suspension mesh (HCC)    GERD (gastroesophageal reflux disease)    Hepatitis    C   History of COVID-19 11/2019   History of MRSA infection    after back surgery   History of sleep apnea    resolved after weight loss   Hypertension    Hypothyroidism    Leg pain, bilateral    Obesity    Obsessive-compulsive disorder    Pneumonia 12/24/2019   Rosacea    Sciatic nerve pain    Social anxiety disorder    Squamous cell carcinoma of back 2021   Past Surgical History:  Procedure Laterality Date   BACK SURGERY     total of 6 back surgeries   bladder mesh  2009   DILITATION & CURRETTAGE/HYSTROSCOPY WITH NOVASURE ABLATION N/A 08/19/2016   Procedure: DILATATION & CURETTAGE WITH NOVASURE ABLATION;  Surgeon: [name], MD;  Location: [facility];  Service: Gynecology;  Laterality: N/A;   EUS N/A 09/09/2015   Procedure: UPPER ENDOSCOPIC ULTRASOUND (EUS) RADIAL;  Surgeon: [name], MD;  Location: [facility];  Service: Endoscopy;  Laterality: N/A;   EYE SURGERY     INCONTINENCE SURGERY     SPINAL CORD STIMULATOR INSERTION     SPINAL CORD STIMULATOR REMOVAL     SPINAL FUSION  2008   times two   TOTAL HIP ARTHROPLASTY Left 10/12/2020   Procedure: LEFT TOTAL HIP ARTHROPLASTY ANTERIOR APPROACH;  Surgeon: Leandrew Koyanagi, MD;  Location: [facility];  Service: Orthopedics;  Laterality: Left;   TUBAL LIGATION  2006   Family History  Problem Relation Age of Onset   Schizophrenia Father    Schizophrenia Cousin    Hyperthyroidism Mother    Hypothyroidism  Maternal Grandmother    Social History   Socioeconomic History   Marital status: Legally Separated    Spouse name: Not on file   Number of children: 2   Years of education: Not on file   Highest education level: Not on file  Occupational History   Occupation: disability  Tobacco Use   Smoking status: Former    Packs/day: 1.00    Years: 15.00    Total pack years: 15.00    Types: Cigarettes    Quit date: 10/04/2021    Years since quitting: 1.0   Smokeless tobacco: Former   Tobacco comments:    use vapor, social  Vaping Use   Vaping Use: Never used  Substance and Sexual Activity   Alcohol use: No   Drug use: No   Sexual activity: Yes  Birth control/protection: Surgical  Other Topics Concern   Not on file  Social History Narrative   Her 2 children live with her   Social Determinants of Health   Financial Resource Strain: Medium Risk (10/14/2022)   Overall Financial Resource Strain (CARDIA)    Difficulty of Paying Living Expenses: Somewhat hard  Food Insecurity: No Food Insecurity (10/14/2022)   Hunger Vital Sign    Worried About Running Out of Food in the Last Year: Never true    Ran Out of Food in the Last Year: Never true  Transportation Needs: Unmet Transportation Needs (10/14/2022)   PRAPARE - Transportation    Lack of Transportation (Medical): Yes    Lack of Transportation (Non-Medical): Yes  Physical Activity: Inactive (10/14/2022)   Exercise Vital Sign    Days of Exercise per Week: 0 days    Minutes of Exercise per Session: 0 min  Stress: Stress Concern Present (10/14/2022)   [place]    Feeling of Stress : To some extent  Social Connections: Socially Isolated (10/13/2021)   Social Connection and Isolation Panel [NHANES]    Frequency of Communication with Friends and Family: More than three times a week    Frequency of Social Gatherings with Friends and Family: More than three times a week     Attends Religious Services: Never    Marine scientist or Organizations: No    Attends Music therapist: Never    Marital Status: Separated    Tobacco Counseling Counseling given: Not Answered Tobacco comments: use vapor, social   Clinical Intake:  Pre-visit preparation completed: Yes  Pain : No/denies pain     Nutritional Status: BMI 25 -29 Overweight Nutritional Risks: None Diabetes: Yes  How often do you need to have someone help you when you read instructions, pamphlets, or other written materials from your doctor or pharmacy?: 1 - Never  Diabetic? Yes Nutrition Risk Assessment:  Has the patient had any N/V/D within the last 2 months?  No  Does the patient have any non-healing wounds?  No  Has the patient had any unintentional weight loss or weight gain?  No   Diabetes:  Is the patient diabetic?  Yes  If diabetic, was a CBG obtained today?  No  Did the patient bring in their glucometer from home?  No  How often do you monitor your CBG's? daily.   Financial Strains and Diabetes Management:  Are you having any financial strains with the device, your supplies or your medication? No .  Does the patient want to be seen by Chronic Care Management for management of their diabetes?  No  Would the patient like to be referred to a Nutritionist or for Diabetic Management?  No   Diabetic Exams:  Diabetic Eye Exam: Overdue for diabetic eye exam. Pt has been advised about the importance in completing this exam. Patient advised to call and schedule an eye exam. Diabetic Foot Exam: Completed 10/29/2021   Interpreter Needed?: No  Information entered by :: NAllen LPN   Activities of Daily Living    10/14/2022   10:35 AM  In your present state of health, do you have any difficulty performing the following activities:  Hearing? 0  Vision? 0  Difficulty concentrating or making decisions? 0  Walking or climbing stairs? 0  Dressing or bathing? 0  Doing  errands, shopping? 0  Preparing Food and eating ? N  Using the Toilet? N  In  the past six months, have you accidently leaked urine? Y  Do you have problems with loss of bowel control? N  Managing your Medications? N  Managing your Finances? N  Housekeeping or managing your Housekeeping? N    Patient Care Team: Libby Maw, MD as PCP - General (Family Medicine) Bjorn Loser, MD as Consulting Physician (Urology) [name], MD as Consulting Physician (Gastroenterology) Sharyne Peach, MD as Consulting Physician (Ophthalmology) Germaine Pomfret, [facility] (Pharmacist)  Indicate any recent Medical Services you may have received from other than [facility] providers in the past year (date may be approximate).     Assessment:   This is a routine wellness examination for [place].  Hearing/Vision screen Vision Screening - Comments:: No regular eye exams, Dr. Delman Cheadle  Dietary issues and exercise activities discussed: Current Exercise Habits: The patient does not participate in regular exercise at present   Goals Addressed             This Visit's Progress    Patient Stated       10/14/2022, wants to get tone       Depression Screen    10/14/2022   10:34 AM 04/19/2022    4:26 PM 03/30/2022    3:24 PM 11/16/2021    4:18 PM 10/29/2021   10:45 AM 10/13/2021    1:04 PM 06/21/2021   11:18 AM  PHQ 2/9 Scores  PHQ - 2 Score 0 0 3 0 0 0 0  PHQ- 9 Score   8        Fall Risk    10/14/2022   10:34 AM 04/19/2022    4:26 PM 11/16/2021    4:18 PM 10/29/2021   10:45 AM 10/13/2021    1:45 PM  Fall Risk   Falls in the past year? 0 0 0 0 0  Number falls in past yr: 0 0 0 0 0  Injury with Fall? 0    0  Risk for fall due to : Medication side effect    No Fall Risks  Follow up Falls prevention discussed;Education provided;Falls evaluation completed    Falls prevention discussed    FALL RISK PREVENTION PERTAINING TO THE HOME:  Any stairs in or around the home? No  If so, are there any  without handrails? N/a Home free of loose throw rugs in walkways, pet beds, electrical cords, etc? Yes  Adequate lighting in your home to reduce risk of falls? Yes   ASSISTIVE DEVICES UTILIZED TO PREVENT FALLS:  Life alert? No  Use of a cane, walker or w/c? No  Grab bars in the bathroom? No  Shower chair or bench in shower? No  Elevated toilet seat or a handicapped toilet? No   TIMED UP AND GO:  Was the test performed? No .      Cognitive Function:        10/14/2022   10:36 AM 10/13/2021    1:19 PM  6CIT Screen  What Year? 4 points 0 points  What month? 0 points 0 points  What time? 0 points 0 points  Count back from 20 0 points 0 points  Months in reverse 0 points 0 points  Repeat phrase 0 points 2 points  Total Score 4 points 2 points    Immunizations Immunization History  Administered Date(s) Administered   Hepatitis A, Ped/Adol-2 Dose 06/17/2016, 12/22/2016   Hepatitis B, adult 06/17/2016, 08/02/2016, 12/22/2016   Influenza,inj,Quad PF,6+ Mos 06/21/2021   Influenza-Unspecified 06/07/2016, 06/04/2020   PFIZER(Purple Top)SARS-COV-2 Vaccination  12/26/2019, 01/18/2020, 07/03/2020   Pneumococcal Polysaccharide-23 10/21/2016   Tdap 10/15/2008, 02/08/2019, 08/06/2022    TDAP status: Up to date  Flu Vaccine status: Due, Education has been provided regarding the importance of this vaccine. Advised may receive this vaccine at local pharmacy or Health Dept. Aware to provide a copy of the vaccination record if obtained from local pharmacy or Health Dept. Verbalized acceptance and understanding.  Pneumococcal vaccine status: Up to date  Covid-19 vaccine status: Completed vaccines  Qualifies for Shingles Vaccine? No   Zostavax completed  n/a   Shingrix Completed?: n/a  Screening Tests Health Maintenance  Topic Date Due   HIV Screening  Never done   Diabetic kidney evaluation - Urine ACR  12/16/2021   INFLUENZA VACCINE  04/05/2022   OPHTHALMOLOGY EXAM  07/13/2022    Medicare Annual Wellness (AWV)  10/13/2022   COLONOSCOPY (Pts 45-72yr Insurance coverage will need to be confirmed)  02/15/2023 (Originally 12/18/2017)   HEMOGLOBIN A1C  10/21/2022   FOOT EXAM  10/29/2022   PAP SMEAR-Modifier  04/16/2023   Diabetic kidney evaluation - eGFR measurement  04/20/2023   DTaP/Tdap/Td (4 - Td or Tdap) 08/06/2032   Hepatitis C Screening  Completed   HPV VACCINES  Aged Out   COVID-19 Vaccine  Discontinued    Health Maintenance  Health Maintenance Due  Topic Date Due   HIV Screening  Never done   Diabetic kidney evaluation - Urine ACR  12/16/2021   INFLUENZA VACCINE  04/05/2022   OPHTHALMOLOGY EXAM  07/13/2022   Medicare Annual Wellness (AWV)  10/13/2022    Colorectal cancer screening: decline   Mammogram status: patient to schedule  Bone Density status: n/a  Lung Cancer Screening: (Low Dose CT Chest recommended if Age 36-80 years, 30 pack-year currently smoking OR have quit w/in 15years.) does not qualify.   Lung Cancer Screening Referral: no  Additional Screening:  Hepatitis C Screening: does qualify; Completed 10/13/2021  Vision Screening: Recommended annual ophthalmology exams for early detection of glaucoma and other disorders of the eye. Is the patient up to date with their annual eye exam?  No  Who is the provider or what is the name of the office in which the patient attends annual eye exams? Dr. GDelman CheadleIf pt is not established with a provider, would they like to be referred to a provider to establish care? No .   Dental Screening: Recommended annual dental exams for proper oral hygiene  Community Resource Referral / Chronic Care Management: CRR required this visit?  Yes   CCM required this visit?  No      Plan:     I have personally reviewed and noted the following in the patient's chart:   Medical and social history Use of alcohol, tobacco or illicit drugs  Current medications and supplements including opioid prescriptions.  Patient is not currently taking opioid prescriptions. Functional ability and status Nutritional status Physical activity Advanced directives List of other physicians Hospitalizations, surgeries, and ER visits in previous 12 months Vitals Screenings to include cognitive, depression, and falls Referrals and appointments  In addition, I have reviewed and discussed with patient certain preventive protocols, quality metrics, and best practice recommendations. A written personalized care plan for preventive services as well as general preventive health recommendations were provided to patient.     NKellie Simmering LPN   [id number]  Nurse Notes: none  Due to this being a virtual visit, the after visit summary with patients personalized plan was offered to patient  via mail or my-chart.  to pick up at office at next visit

## 2022-10-17 ENCOUNTER — Telehealth: Payer: Self-pay

## 2022-10-17 NOTE — Telephone Encounter (Signed)
   Telephone encounter was:  Successful.  10/17/2022 Name: [name] MRN: [id number] DOB: 1971/05/19  [name] is a 52 y.o. year old female who is a primary care patient of Libby Maw, MD . The community resource team was consulted for assistance with Transportation Needs , Food Insecurity, and Financial Difficulties related to financial strain  Care guide performed the following interventions: Patient provided with information about care guide support team and interviewed to confirm resource needs.Patient stated she is having financial strain due to not having enough money to pay all the bills and utilities. Patient is also in need of transportation resources for future appointment. I will be mailing  and adding referrals in [place] as well as I gave her resources over the phone  Follow Up Plan:  No further follow up planned at this time. The patient has been provided with needed resources.    [facility] [phone] [address], [place], [place] [address] Phone: [phone] Email: [email or web address]

## 2022-10-17 NOTE — Telephone Encounter (Signed)
Mailing Resources and [place] referrals   [place], [place] [phone] [address], [place], [place] [address] Phone: [phone] Email: [email or web address]

## 2022-10-27 ENCOUNTER — Other Ambulatory Visit: Payer: Self-pay | Admitting: Family Medicine

## 2022-10-27 DIAGNOSIS — E1165 Type 2 diabetes mellitus with hyperglycemia: Secondary | ICD-10-CM

## 2022-10-28 ENCOUNTER — Telehealth: Payer: Self-pay | Admitting: Family Medicine

## 2022-10-28 ENCOUNTER — Encounter (HOSPITAL_BASED_OUTPATIENT_CLINIC_OR_DEPARTMENT_OTHER): Payer: Self-pay | Admitting: Emergency Medicine

## 2022-10-28 ENCOUNTER — Other Ambulatory Visit: Payer: Self-pay

## 2022-10-28 ENCOUNTER — Emergency Department (HOSPITAL_BASED_OUTPATIENT_CLINIC_OR_DEPARTMENT_OTHER): Payer: PPO

## 2022-10-28 ENCOUNTER — Emergency Department (HOSPITAL_BASED_OUTPATIENT_CLINIC_OR_DEPARTMENT_OTHER)
Admission: EM | Admit: 2022-10-28 | Discharge: 2022-10-28 | Disposition: A | Discharge status: Home / Self Care | Payer: PPO | Attending: Emergency Medicine | Admitting: Emergency Medicine

## 2022-10-28 DIAGNOSIS — Z79899 Other long term (current) drug therapy: Secondary | ICD-10-CM | POA: Diagnosis not present

## 2022-10-28 DIAGNOSIS — Z1152 Encounter for screening for COVID-19: Secondary | ICD-10-CM | POA: Diagnosis not present

## 2022-10-28 DIAGNOSIS — E119 Type 2 diabetes mellitus without complications: Secondary | ICD-10-CM | POA: Insufficient documentation

## 2022-10-28 DIAGNOSIS — J101 Influenza due to other identified influenza virus with other respiratory manifestations: Secondary | ICD-10-CM | POA: Insufficient documentation

## 2022-10-28 DIAGNOSIS — R197 Diarrhea, unspecified: Secondary | ICD-10-CM | POA: Diagnosis not present

## 2022-10-28 DIAGNOSIS — J111 Influenza due to unidentified influenza virus with other respiratory manifestations: Secondary | ICD-10-CM

## 2022-10-28 DIAGNOSIS — R0602 Shortness of breath: Secondary | ICD-10-CM | POA: Diagnosis not present

## 2022-10-28 DIAGNOSIS — I10 Essential (primary) hypertension: Secondary | ICD-10-CM | POA: Insufficient documentation

## 2022-10-28 DIAGNOSIS — I959 Hypotension, unspecified: Secondary | ICD-10-CM | POA: Diagnosis not present

## 2022-10-28 DIAGNOSIS — Z7984 Long term (current) use of oral hypoglycemic drugs: Secondary | ICD-10-CM | POA: Diagnosis not present

## 2022-10-28 LAB — CBC
HCT: 40.7 % (ref 36.0–46.0)
Hemoglobin: 13.3 g/dL (ref 12.0–15.0)
MCH: 28.3 pg (ref 26.0–34.0)
MCHC: 32.7 g/dL (ref 30.0–36.0)
MCV: 86.6 fL (ref 80.0–100.0)
Platelets: 292 10*3/uL (ref 150–400)
RBC: 4.7 MIL/uL (ref 3.87–5.11)
RDW: 14 % (ref 11.5–15.5)
WBC: 8.3 10*3/uL (ref 4.0–10.5)
nRBC: 0 % (ref 0.0–0.2)

## 2022-10-28 LAB — COMPREHENSIVE METABOLIC PANEL
ALT: 28 U/L (ref 0–44)
AST: 18 U/L (ref 15–41)
Albumin: 4.2 g/dL (ref 3.5–5.0)
Alkaline Phosphatase: 69 U/L (ref 38–126)
Anion gap: 6 (ref 5–15)
BUN: 8 mg/dL (ref 6–20)
CO2: 26 mmol/L (ref 22–32)
Calcium: 9.2 mg/dL (ref 8.9–10.3)
Chloride: 103 mmol/L (ref 98–111)
Creatinine, Ser: 0.72 mg/dL (ref 0.44–1.00)
GFR, Estimated: >60 mL/min (ref >60)
Glucose, Bld: 94 mg/dL (ref 70–99)
Potassium: 3.9 mmol/L (ref 3.5–5.1)
Sodium: 135 mmol/L (ref 135–145)
Total Bilirubin: 0.9 mg/dL (ref 0.3–1.2)
Total Protein: 7.1 g/dL (ref 6.5–8.1)

## 2022-10-28 LAB — URINALYSIS, ROUTINE W REFLEX MICROSCOPIC
Bilirubin Urine: NEGATIVE
Glucose, UA: NEGATIVE mg/dL
Ketones, ur: NEGATIVE mg/dL
Nitrite: NEGATIVE
Protein, ur: NEGATIVE mg/dL
Specific Gravity, Urine: 1.015 (ref 1.005–1.030)
pH: 7 (ref 5.0–8.0)

## 2022-10-28 LAB — TROPONIN I (HIGH SENSITIVITY)
Troponin I (High Sensitivity): <2 ng/L (ref <18)
Troponin I (High Sensitivity): <2 ng/L (ref <18)

## 2022-10-28 LAB — URINALYSIS, MICROSCOPIC (REFLEX)

## 2022-10-28 LAB — RESP PANEL BY RT-PCR (RSV, FLU A&B, COVID)  RVPGX2
Influenza A by PCR: NEGATIVE
Influenza B by PCR: POSITIVE — AB
Resp Syncytial Virus by PCR: NEGATIVE
SARS Coronavirus 2 by RT PCR: NEGATIVE

## 2022-10-28 LAB — LIPASE, BLOOD: Lipase: 29 U/L (ref 11–51)

## 2022-10-28 LAB — CBG MONITORING, ED: Glucose-Capillary: 90 mg/dL (ref 70–99)

## 2022-10-28 LAB — PREGNANCY, URINE: Preg Test, Ur: NEGATIVE

## 2022-10-28 MED ORDER — SODIUM CHLORIDE 0.9 % IV BOLUS
1000.0000 mL | Freq: Once | INTRAVENOUS | Status: AC
  Administered 2022-10-28: 1000 mL via INTRAVENOUS

## 2022-10-28 NOTE — ED Notes (Signed)
Provided pt with cheese, applesauce, and coke per request. Pt has been able to hold down PO intake w/o difficulty and says she is feeling much better.

## 2022-10-28 NOTE — ED Provider Notes (Signed)
[facility] Provider Note   CSN: [id number] Arrival date & time: 10/28/22  1235     History  Chief Complaint  Patient presents with   Diarrhea    Ennis L Rajan is a 52 y.o. female, history of hypertension, diabetes, who presents to the ED secondary to low blood pressure at home, she states that her blood pressure was 80/55 at home, and so she went to her primary care doctor and they told her to come to the ER.  States that her son was recently diagnosed with the flu, and that she has been feeling a little bit achy, and having some shortness of breath for the last couple days.  Also states that she is on high blood pressure medication, has been taking it, but blood pressure has been low at home, like 114/70, but primary care told her to continue taking it.  Today she noticed it was 80/55.  Feels weak and lightheaded.  States that she gets more lightheaded when she sits up or stands up.  Denies any chest pain.  Denies any nausea, vomiting, but does endorse reduced appetite.  States that she does have diarrhea about 3 times a week, but has not had more than usual.    Home Medications Prior to Admission medications   Medication Sig Start Date End Date Taking? Authorizing Provider  ALPRAZolam Duanne Moron) 0.5 MG tablet Take 0.5 mg by mouth 3 (three) times daily as needed for anxiety. 12/20/19   [provider]  ARIPiprazole (ABILIFY) 5 MG tablet Take 1 tablet (5 mg total) by mouth daily. 09/17/15   Clarene Reamer, MD  aspirin-acetaminophen-caffeine (EXCEDRIN MIGRAINE) [phone] MG tablet Take 1 tablet by mouth every 6 (six) hours as needed for migraine.    [provider]  atorvastatin (LIPITOR) 20 MG tablet TAKE 1 TABLET(20 MG) BY MOUTH DAILY 10/02/22   Libby Maw, MD  blood glucose meter kit and supplies KIT Dispense based on patient and insurance preference. Check glucose before breakfast. ICD: E11.65 04/02/20   Nche,  Charlene , NP  cephALEXin (KEFLEX) 500 MG capsule Take 1 capsule (500 mg total) by mouth 4 (four) times daily. Patient not taking: Reported on 10/14/2022 08/06/22   Theressa Stamps R, PA  diclofenac Sodium (VOLTAREN) 1 % GEL Apply 2 g topically daily as needed. 06/20/22   Libby Maw, MD  FINACEA 15 % FOAM Apply 1 application  topically 2 (two) times daily as needed (rosacea). 02/14/20   [provider]  gabapentin (NEURONTIN) 300 MG capsule TAKE 1 CAPSULE(300 MG) BY MOUTH THREE TIMES DAILY 07/31/22   Libby Maw, MD  glucose blood test strip Use as instructed, check glucose before breakfast. ICD: E11.65 07/21/20   Libby Maw, MD  lamoTRIgine (LAMICTAL) 150 MG tablet Take 300 mg by mouth daily. 02/22/20   [provider]  Lancets (FREESTYLE) lancets Use as instructed. E11.65 07/21/20   Libby Maw, MD  levothyroxine (SYNTHROID) 75 MCG tablet TAKE 1 TABLET(75 MCG) BY MOUTH DAILY BEFORE BREAKFAST Patient taking differently: Take 75 mcg by mouth daily before breakfast. 02/25/22   Libby Maw, MD  lisinopril (ZESTRIL) 20 MG tablet Take 1 tablet (20 mg total) by mouth daily. 04/19/22   Libby Maw, MD  metFORMIN (GLUCOPHAGE) 500 MG tablet Take 1 tablet (500 mg total) by mouth 2 (two) times daily with a meal. Needs appointment for further refills. 10/27/22   Libby Maw, MD  methylphenidate (  RITALIN) 20 MG tablet Take 20 mg by mouth 2 (two) times daily. 09/22/20   [provider]  OZEMPIC, 0.25 OR 0.5 MG/DOSE, 2 MG/3ML SOPN INJECT 0.25 MG SUBCUTANEOUSLY EVERY THURSDAY 06/28/22   Libby Maw, MD  pantoprazole (PROTONIX) 40 MG tablet Take 1 tablet by mouth daily. 01/06/21   [provider]      Allergies    Pregabalin, Bupropion hcl, Celecoxib, Effexor [venlafaxine hcl], Oxybutynin, Pramipexole, Pramipexole dihydrochloride, Pregabalin, Prozac [fluoxetine hcl], Symbyax [olanzapine-fluoxetine  hcl], Venlafaxine, and Desvenlafaxine    Review of Systems   Review of Systems  Gastrointestinal:  Positive for diarrhea. Negative for nausea and vomiting.    Physical Exam Updated Vital Signs BP 110/64   Pulse (!) 56   Temp 98.2 F (36.8 C)   Resp 18   LMP 10/21/2022 (Approximate)   SpO2 100%  Physical Exam Vitals and nursing note reviewed.  Constitutional:      General: She is not in acute distress.    Appearance: She is well-developed.  HENT:     Head: Normocephalic and atraumatic.  Eyes:     Conjunctiva/sclera: Conjunctivae normal.  Cardiovascular:     Rate and Rhythm: Normal rate and regular rhythm.     Heart sounds: No murmur heard. Pulmonary:     Effort: Pulmonary effort is normal. No respiratory distress.     Breath sounds: Normal breath sounds.  Abdominal:     Palpations: Abdomen is soft.     Tenderness: There is no abdominal tenderness.  Musculoskeletal:        General: No swelling.     Cervical back: Neck supple.  Skin:    General: Skin is warm and dry.     Capillary Refill: Capillary refill takes less than 2 seconds.  Neurological:     Mental Status: She is alert.  Psychiatric:        Mood and Affect: Mood normal.     ED Results / Procedures / Treatments   Labs (all labs ordered are listed, but only abnormal results are displayed) Labs Reviewed  RESP PANEL BY RT-PCR (RSV, FLU A&B, COVID)  RVPGX2 - Abnormal; Notable for the following components:      Result Value   Influenza B by PCR POSITIVE (*)    All other components within normal limits  URINALYSIS, ROUTINE W REFLEX MICROSCOPIC - Abnormal; Notable for the following components:   Hgb urine dipstick TRACE (*)    Leukocytes,Ua MODERATE (*)    All other components within normal limits  URINALYSIS, MICROSCOPIC (REFLEX) - Abnormal; Notable for the following components:   Bacteria, UA RARE (*)    All other components within normal limits  LIPASE, BLOOD  COMPREHENSIVE METABOLIC PANEL  CBC   PREGNANCY, URINE  CBG MONITORING, ED  TROPONIN I (HIGH SENSITIVITY)  TROPONIN I (HIGH SENSITIVITY)    EKG EKG Interpretation  Date/Time:  Friday October 28 2022 12:59:27 EST Ventricular Rate:  69 PR Interval:    QRS Duration: 94 QT Interval:  396 QTC Calculation: 425 R Axis:   74 Text Interpretation: Junctional rhythm Low voltage, precordial leads No significant change since last tracing Confirmed by Leanord Asal ([id number]) on 10/28/2022 2:41:18 PM  Radiology DG Chest 2 View  Result Date: 10/28/2022 CLINICAL DATA:  Shortness of breath.  Diarrhea for 2 days EXAM: CHEST - 2 VIEW COMPARISON:  X-ray 1523 and older FINDINGS: No consolidation, pneumothorax or effusion. No edema. Degenerative changes seen along the spine. IMPRESSION: No acute cardiopulmonary disease Electronically Signed  By: Jill Side M.D.   On: 10/28/2022 13:45    Procedures Procedures    Medications Ordered in ED Medications  sodium chloride 0.9 % bolus 1,000 mL (0 mLs Intravenous Stopped 10/28/22 1531)    ED Course/ Medical Decision Making/ A&P                             Medical Decision Making Patient is a 52 year old female, here for low blood pressure, and feeling unwell for the last couple days.  She states that her son has the flu, and that she just feels achy.  Hu primarily came here for her blood pressure.  Hu she typically has diarrhea about 3 times a week, this is normal for her.  Denies any nausea or vomiting.  No abdominal or chest pain.  We will obtain labs including CBC, CMP, troponins given history of diabetes, hypertension  Amount and/or Complexity of Data Reviewed Labs: ordered.    Details: Influenza positive Radiology: ordered.    Details: CXR clear ECG/medicine tests:  Decision-making details documented in ED Course. Discussion of management or test interpretation with external provider(s): Influenza positive discussed with patient, influenza positive, negative troponins, chest x-ray  clear.  Not hypotensive here, however she states that she has lost quite a bit of weight, since being started on her blood pressure pills, and that she has noticed has been lower.  Today she felt just a bit dizzy, and had had lightheadedness for the last couple days.  We discussed that given that her weight loss has been so extreme, may have caused a decrease in her blood pressure, and that she not should not take her blood pressure medication anymore, or cut in half, and follow-up with her primary care doctor.  We discussed return precautions, and the need for follow-up with PCP.  She is out of the window for Tamiflu thus this was not offered.She was well-appearing at the time of discharge.   Final Clinical Impression(s) / ED Diagnoses Final diagnoses:  Influenza  Hypotension, unspecified hypotension type    Rx / DC Orders ED Discharge Orders     None         Mazzy Santarelli Carlean Jews, PA 10/28/22 1616    Leanord Asal K, DO 10/29/22 1259

## 2022-10-28 NOTE — Telephone Encounter (Signed)
FYI:Pt called in stating she felt light headed and her bp was 80/52. I transferred her over to nurse triage.

## 2022-10-28 NOTE — ED Triage Notes (Signed)
Diarrhea x 2 days , reports hypotension taken at home , positional dizziness.

## 2022-10-28 NOTE — ED Notes (Signed)
IV removed.

## 2022-10-28 NOTE — Telephone Encounter (Signed)
Patient states that she was seen at ER and tested positive for flu feeling better now. Was told to not take BP meds tomorrow and take 1/2 pill the following day. Offered appointment for follow up on BP and ER visit per patient she will call back to schedule due to transportation issues.

## 2022-10-28 NOTE — Discharge Instructions (Addendum)
Please follow-up with your primary care doctor.  You can cut your lisinopril in half, or hold your lisinopril and lisinopril for 1 day, and check your blood pressure, if your blood pressure is less than 140/80, hold your blood pressure pill.  Follow-up with your PCP.  Make sure you are drinking lots of water, and resting.

## 2022-11-08 ENCOUNTER — Ambulatory Visit (INDEPENDENT_AMBULATORY_CARE_PROVIDER_SITE_OTHER): Payer: PPO | Admitting: Physician Assistant

## 2022-11-08 ENCOUNTER — Ambulatory Visit (INDEPENDENT_AMBULATORY_CARE_PROVIDER_SITE_OTHER): Payer: PPO

## 2022-11-08 DIAGNOSIS — Z96642 Presence of left artificial hip joint: Secondary | ICD-10-CM

## 2022-11-08 NOTE — Progress Notes (Signed)
Post-Op Visit Note   Patient: [name]           Date of Birth: [birth year]           MRN: [id number] Visit Date: 11/08/2022 PCP: Libby Maw, MD   Assessment & Plan:  Chief Complaint:  Chief Complaint  Patient presents with   Left Hip - Pain   Visit Diagnoses:  1. History of total hip replacement, left     Plan: Patient is a pleasant 52 year old female who comes in today 1 year status post left total hip replacement 10/12/2020.  She is doing great.  She notes slight discomfort at times when sleeping on her left side at night.  Left hip exam reveals a painless logroll and hip flexion.  She is neurovascular intact distally.  This point, she will continue to advance with activity as tolerated.  Dental prophylaxis reinforced for another year.  Follow-up in 1 year for recheck.  Call with concerns or questions.  Follow-Up Instructions: Return in about 1 year (around 11/08/2023).   Orders:  Orders Placed This Encounter  Procedures   XR Pelvis 1-2 Views   No orders of the defined types were placed in this encounter.   Imaging: XR Pelvis 1-2 Views  Result Date: 11/08/2022 Well-seated prosthesis without complication   PMFS History: Patient Active Problem List   Diagnosis Date Noted   Abscess 03/30/2022   Amenorrhea 10/29/2021   Abnormal findings on diagnostic imaging of liver and biliary tract 10/13/2021   Colon cancer screening 10/13/2021   Constipation 10/13/2021   Gallstones, common bile duct 10/13/2021   Gastroesophageal reflux disease 10/13/2021   History of hepatitis C 10/13/2021   Intestinal gas excretion 10/13/2021   Liver fibrosis 10/13/2021   Right upper quadrant pain 10/13/2021   Stress 06/21/2021   Need for influenza vaccination 06/21/2021   Periorbital hyperpigmentation 05/03/2021   Abnormal CBC 05/03/2021   Lower respiratory infection 02/04/2021   Hematuria 12/28/2020   Status post total replacement of left hip 10/12/2020   Type 2 diabetes  mellitus with hyperglycemia, without long-term current use of insulin ([place]) 04/02/2020   Peripheral edema 04/02/2020   COVID-19 07/09/2019   Left hip pain 04/30/2019   Otitis externa of left ear 04/30/2019   Cholesteatoma of left ear 04/30/2019   Dysfunction of left eustachian tube 04/30/2019   Left ear pain 04/30/2019   Sciatica 04/19/2019   Class 3 severe obesity due to excess calories with serious comorbidity and body mass index (BMI) of 45.0 to 49.9 in adult ([place]) 04/19/2019   Post-nasal drip 01/10/2019   Allergic cough 01/10/2019   Obstructive sleep apnea syndrome 12/11/2018   Acute frontal sinusitis 12/10/2018   Primary osteoarthritis of left hip 11/13/2018   Morbid obesity ([place]) 11/13/2018   Elevated cholesterol 11/12/2018   Essential hypertension 11/12/2018   Allergic rhinitis due to pollen 11/12/2018   Reactive airway disease 11/12/2018   Breast pain, right 07/17/2018   Prediabetes 07/17/2018   Refused influenza vaccine 07/17/2018   Chronic nonintractable headache 02/12/2018   Dysuria 02/12/2018   Localized edema 02/12/2018   Hospital discharge follow-up 01/23/2018   Bladder problem 01/05/2018   Depression 01/05/2018   Insomnia 01/05/2018   Hepatitis C infection 12/01/2015   RLS (restless legs syndrome) 10/23/2015   Elevated liver enzymes 10/22/2015   Recurrent major depression-severe ([place]) 04/17/2015   GAD (generalized anxiety disorder) 04/15/2015   Bipolar depression ([place]) 04/15/2015   Severe recurrent major depression w/psychotic features, mood-congruent ([place]) 04/14/2015  Anxiety disorder 04/14/2015   Major depressive disorder, recurrent severe without psychotic features ([place]) 04/14/2015   Suicidal ideations    Chronic LBP 01/02/2015   Adult hypothyroidism 06/25/2012   B12 deficiency 06/25/2012   Other fatigue 06/25/2012   Chronic arthralgias of knees and hips 06/25/2012   Leukocytosis 06/25/2012   Vitamin D deficiency 06/25/2012   Past Medical History:   Diagnosis Date   ADHD (attention deficit hyperactivity disorder)    Anemia    with pregnancy   Anxiety    Arthritis    Bipolar disorder (HCC)    Chronic back pain greater than 3 months duration    Depression    Diabetes mellitus without complication (HCC)    Diabetic nephropathy (HCC)    bilateral feet   Dysuria    has to have self in and out cath    Eroded bladder suspension mesh (HCC)    GERD (gastroesophageal reflux disease)    Hepatitis    C   History of COVID-19 11/2019   History of MRSA infection    after back surgery   History of sleep apnea    resolved after weight loss   Hypertension    Hypothyroidism    Leg pain, bilateral    Obesity    Obsessive-compulsive disorder    Pneumonia 12/24/2019   Rosacea    Sciatic nerve pain    Social anxiety disorder    Squamous cell carcinoma of back 2021    Family History  Problem Relation Age of Onset   Schizophrenia Father    Schizophrenia Cousin    Hyperthyroidism Mother    Hypothyroidism Maternal Grandmother     Past Surgical History:  Procedure Laterality Date   BACK SURGERY     total of 6 back surgeries   bladder mesh  2009   DILITATION & CURRETTAGE/HYSTROSCOPY WITH NOVASURE ABLATION N/A 08/19/2016   Procedure: DILATATION & CURETTAGE WITH NOVASURE ABLATION;  Surgeon: [name], MD;  Location: [facility];  Service: Gynecology;  Laterality: N/A;   EUS N/A 09/09/2015   Procedure: UPPER ENDOSCOPIC ULTRASOUND (EUS) RADIAL;  Surgeon: [name], MD;  Location: [facility];  Service: Endoscopy;  Laterality: N/A;   EYE SURGERY     INCONTINENCE SURGERY     SPINAL CORD STIMULATOR INSERTION     SPINAL CORD STIMULATOR REMOVAL     SPINAL FUSION  2008   times two   TOTAL HIP ARTHROPLASTY Left 10/12/2020   Procedure: LEFT TOTAL HIP ARTHROPLASTY ANTERIOR APPROACH;  Surgeon: Leandrew Koyanagi, MD;  Location: [facility];  Service: Orthopedics;  Laterality: Left;   TUBAL LIGATION  2006   Social History   Occupational History    Occupation: disability  Tobacco Use   Smoking status: Former    Packs/day: 1.00    Years: 15.00    Total pack years: 15.00    Types: Cigarettes    Quit date: 10/04/2021    Years since quitting: 1.0   Smokeless tobacco: Former   Tobacco comments:    use vapor, social  Vaping Use   Vaping Use: Never used  Substance and Sexual Activity   Alcohol use: No   Drug use: No   Sexual activity: Yes    Birth control/protection: Surgical

## 2022-11-14 ENCOUNTER — Other Ambulatory Visit: Payer: Self-pay | Admitting: Family Medicine

## 2022-11-25 ENCOUNTER — Other Ambulatory Visit: Payer: Self-pay | Admitting: Family Medicine

## 2022-11-25 DIAGNOSIS — G2581 Restless legs syndrome: Secondary | ICD-10-CM

## 2022-11-25 DIAGNOSIS — G8929 Other chronic pain: Secondary | ICD-10-CM

## 2022-11-30 ENCOUNTER — Telehealth: Payer: Self-pay | Admitting: Family Medicine

## 2022-11-30 ENCOUNTER — Encounter: Payer: Self-pay | Admitting: Nurse Practitioner

## 2022-11-30 ENCOUNTER — Ambulatory Visit (INDEPENDENT_AMBULATORY_CARE_PROVIDER_SITE_OTHER): Payer: PPO | Admitting: Nurse Practitioner

## 2022-11-30 VITALS — BP 110/70 | HR 73 | Temp 98.7°F | Resp 16 | Ht 64.0 in | Wt 161.0 lb

## 2022-11-30 DIAGNOSIS — K5903 Drug induced constipation: Secondary | ICD-10-CM | POA: Diagnosis not present

## 2022-11-30 DIAGNOSIS — Z5982 Transportation insecurity: Secondary | ICD-10-CM

## 2022-11-30 DIAGNOSIS — N3001 Acute cystitis with hematuria: Secondary | ICD-10-CM

## 2022-11-30 LAB — POC URINALSYSI DIPSTICK (AUTOMATED)
Bilirubin, UA: NEGATIVE
Blood, UA: 2
Glucose, UA: NEGATIVE
Ketones, UA: NEGATIVE
Nitrite, UA: NEGATIVE
Protein, UA: NEGATIVE
Spec Grav, UA: 1.01 (ref 1.010–1.025)
Urobilinogen, UA: 0.2 E.U./dL
pH, UA: 6 (ref 5.0–8.0)

## 2022-11-30 MED ORDER — POLYETHYLENE GLYCOL 3350 17 GM/SCOOP PO POWD: 17.0000 g | Freq: Every day | ORAL | 3 refills | Status: DC

## 2022-11-30 MED ORDER — FLUCONAZOLE 150 MG PO TABS: 150.0000 mg | ORAL_TABLET | Freq: Once | ORAL | 0 refills | Status: AC

## 2022-11-30 MED ORDER — NITROFURANTOIN MONOHYD MACRO 100 MG PO CAPS: 100.0000 mg | ORAL_CAPSULE | Freq: Two times a day (BID) | ORAL | 0 refills | Status: DC

## 2022-11-30 NOTE — Telephone Encounter (Signed)
This pt saw [name] today and is asking about her urine results and medication. She was informed it may be tomorrow.

## 2022-11-30 NOTE — Progress Notes (Unsigned)
Established Patient Visit  Patient: [name]   DOB: 1970-09-18   52 y.o. Female  MRN: [id number] Visit Date: 12/01/2022  Subjective:    Chief Complaint  Patient presents with   Urinary Tract Infection    Frequency and burning-     Urinary Tract Infection  This is a new problem. The current episode started in the past 7 days. The problem occurs every urination. The problem has been unchanged. The quality of the pain is described as burning. The pain is mild. There has been no fever. She is Sexually active. There is No history of pyelonephritis. Associated symptoms include frequency and urgency. Pertinent negatives include no chills, discharge, flank pain, hematuria, hesitancy, nausea, possible pregnancy, sweats or vomiting. Treatments tried: AZO oTC. The treatment provided no relief. Her past medical history is significant for recurrent UTIs. There is no history of catheterization, kidney stones, a single kidney, urinary stasis or a urological procedure.  Hx of recurrent UTI, last episode 83yrs Use of keflex in 08/2023 S/p tubal ligation  Reviewed medical, surgical, and social history today  Medications: Outpatient Medications Prior to Visit  Medication Sig   ALPRAZolam (XANAX) 0.5 MG tablet Take 0.5 mg by mouth 3 (three) times daily as needed for anxiety.   ARIPiprazole (ABILIFY) 5 MG tablet Take 1 tablet (5 mg total) by mouth daily.   aspirin-acetaminophen-caffeine (EXCEDRIN MIGRAINE) 250-250-65 MG tablet Take 1 tablet by mouth every 6 (six) hours as needed for migraine.   atorvastatin (LIPITOR) 20 MG tablet TAKE 1 TABLET(20 MG) BY MOUTH DAILY   blood glucose meter kit and supplies KIT Dispense based on patient and insurance preference. Check glucose before breakfast. ICD: E11.65   diclofenac Sodium (VOLTAREN) 1 % GEL Apply 2 g topically daily as needed.   FINACEA 15 % FOAM Apply 1 application  topically 2 (two) times daily as needed (rosacea).   gabapentin  (NEURONTIN) 300 MG capsule TAKE 1 CAPSULE(300 MG) BY MOUTH THREE TIMES DAILY   glucose blood test strip Use as instructed, check glucose before breakfast. ICD: E11.65   lamoTRIgine (LAMICTAL) 150 MG tablet Take 300 mg by mouth daily.   Lancets (FREESTYLE) lancets Use as instructed. E11.65   levothyroxine (SYNTHROID) 75 MCG tablet TAKE 1 TABLET(75 MCG) BY MOUTH DAILY BEFORE BREAKFAST (Patient taking differently: Take 75 mcg by mouth daily before breakfast.)   lisinopril (ZESTRIL) 20 MG tablet Take 1 tablet (20 mg total) by mouth daily.   metFORMIN (GLUCOPHAGE) 500 MG tablet Take 1 tablet (500 mg total) by mouth 2 (two) times daily with a meal. Needs appointment for further refills.   methylphenidate (RITALIN) 20 MG tablet Take 20 mg by mouth 2 (two) times daily.   pantoprazole (PROTONIX) 40 MG tablet Take 1 tablet by mouth daily.   Semaglutide,0.25 or 0.5MG /DOS, (OZEMPIC, 0.25 OR 0.5 MG/DOSE,) 2 MG/3ML SOPN INJECT 0.25 MG SUBCUTANEOUSLY EVERY THURSDAY   cephALEXin (KEFLEX) 500 MG capsule Take 1 capsule (500 mg total) by mouth 4 (four) times daily. (Patient not taking: Reported on 10/14/2022)   No facility-administered medications prior to visit.   Reviewed past medical and social history.   ROS per HPI above      Objective:  BP 110/70 (BP Location: Left Arm, Patient Position: Sitting, Cuff Size: Large)   Pulse 73   Temp 98.7 F (37.1 C) (Temporal)   Resp 16   Ht 5\' 4"  (1.626 m)   Wt  161 lb (73 kg)   LMP 11/23/2022   SpO2 98%   BMI 27.64 kg/m      Physical Exam Vitals and nursing note reviewed.  Constitutional:      General: She is not in acute distress. Cardiovascular:     Rate and Rhythm: Normal rate.     Pulses: Normal pulses.  Pulmonary:     Effort: Pulmonary effort is normal.  Abdominal:     Tenderness: There is abdominal tenderness in the suprapubic area. There is no right CVA tenderness or left CVA tenderness.  Neurological:     Mental Status: She is oriented to  person, place, and time.     Results for orders placed or performed in visit on 11/30/22  POCT Urinalysis Dipstick (Automated)  Result Value Ref Range   Color, UA Yellow    Clarity, UA clear    Glucose, UA Negative Negative   Bilirubin, UA neg    Ketones, UA neg    Spec Grav, UA 1.010 1.010 - 1.025   Blood, UA 2    pH, UA 6.0 5.0 - 8.0   Protein, UA Negative Negative   Urobilinogen, UA 0.2 0.2 or 1.0 E.U./dL   Nitrite, UA neg    Leukocytes, UA Large (3+) (A) Negative      Assessment & Plan:    Problem List Items Addressed This Visit       Other   Constipation   Relevant Medications   polyethylene glycol powder (GLYCOLAX/MIRALAX) 17 GM/SCOOP powder   Dysuria - Primary   Relevant Medications   nitrofurantoin, macrocrystal-monohydrate, (MACROBID) 100 MG capsule   Other Visit Diagnoses     Transportation unavailable       Relevant Orders   AMB Referral to [facility] ([facility])      Return if symptoms worsen or fail to improve.     Wilfred Lacy, NP

## 2022-11-30 NOTE — Patient Instructions (Addendum)
Maintain adequate oral hydration F/up with pcp if no improvement in 1week.

## 2022-12-01 ENCOUNTER — Telehealth: Payer: Self-pay

## 2022-12-01 NOTE — Telephone Encounter (Signed)
   Telephone encounter was:  Successful.  12/01/2022 Name: [name] MRN: [id number] DOB: 04/26/1971  [name] is a 52 y.o. year old female who is a primary care patient of Libby Maw, MD . The community resource team was consulted for assistance with Transportation Needs   Care guide performed the following interventions: Patient provided with information about care guide support team and interviewed to confirm resource needs. Patient requested I call back on Monday  Follow Up Plan:  Care guide will follow up with patient by phone over the next Monday   [facility], [place] [phone] [address], [place], [place] [address] Phone: [phone] Email: [email or web address]

## 2022-12-05 ENCOUNTER — Telehealth: Payer: Self-pay

## 2022-12-05 NOTE — Telephone Encounter (Signed)
Called patient and gave results.

## 2022-12-05 NOTE — Telephone Encounter (Signed)
   Telephone encounter was:  Unsuccessful.  12/05/2022 Name: LYBERTY MOSS MRN: [id number] DOB: [birth year]  Unsuccessful outbound call made today to assist with:  Transportation Needs   Outreach Attempt:  2nd Attempt  A HIPAA compliant voice message was left requesting a return call.  Instructed patient to call back    [facility] [place] [phone] [address], [place], [place] [address] Phone: [phone] Email: [email or web address]

## 2022-12-06 ENCOUNTER — Telehealth: Payer: Self-pay | Admitting: Family Medicine

## 2022-12-06 ENCOUNTER — Telehealth: Payer: Self-pay

## 2022-12-06 NOTE — Telephone Encounter (Signed)
   Telephone encounter was:  Successful.  12/06/2022 Name: [name] MRN: [id number] DOB: 1971-06-03  [name] is a 52 y.o. year old female who is a primary care patient of Libby Maw, MD . The community resource team was consulted for assistance with Transportation Needs   Care guide performed the following interventions: Patient provided with information about care guide support team and interviewed to confirm resource needs.Patient stated she needs transportation resources. I have gave the patient resources over the phone and mailed other resources for [place]   Follow Up Plan:  No further follow up planned at this time. The patient has been provided with needed resources.    [place] [phone] [address], [place], [place] [address] Phone: [phone] Email: [email or web address]

## 2022-12-06 NOTE — Telephone Encounter (Signed)
Pt called and would like the phone # for the last gastro referral you gave her. ( I did not see that number she need )

## 2022-12-06 NOTE — Telephone Encounter (Signed)
Requested number to GI given to patient per patient she would like to schedule with them for continued stomach pains that she has.

## 2022-12-07 ENCOUNTER — Telehealth: Payer: Self-pay | Admitting: Family Medicine

## 2022-12-07 ENCOUNTER — Other Ambulatory Visit: Payer: Self-pay | Admitting: Family

## 2022-12-07 ENCOUNTER — Telehealth: Payer: Self-pay

## 2022-12-07 MED ORDER — LISINOPRIL 10 MG PO TABS: 10.0000 mg | ORAL_TABLET | Freq: Every day | ORAL | 1 refills | Status: DC

## 2022-12-07 NOTE — Telephone Encounter (Signed)
Spoke with patient regarding lisinopril 10mg  per patient she was changed to 20mg  on 04/19/22 due to high readings at home. Patient was then seen at hospital for low readings at home on 10/28/22 she's been on 10mg  since the hospital and would like a refill on that per patient she has enough pills to hold her until Dr. Ethelene Hal return on 12/12/22. Please advise

## 2022-12-07 NOTE — Telephone Encounter (Signed)
Pt said dr Ethelene Hal denied 66m lisinopril  and she want to know why because she need the medication

## 2022-12-07 NOTE — Telephone Encounter (Signed)
Duplicate message original forwarded to Dr. Ethelene Hal.  Spoke with patient regarding lisinopril 10mg  per patient she was changed to 20mg  on 04/19/22 due to high readings at home. Patient was then seen at hospital for low readings at home on 10/28/22 she's been on 10mg  since the hospital and would like a refill on that per patient she has enough pills to hold her until Dr. Ethelene Hal return on 12/12/22. Please advise

## 2022-12-08 ENCOUNTER — Telehealth: Payer: Self-pay | Admitting: Gastroenterology

## 2022-12-08 NOTE — Telephone Encounter (Signed)
Inbound call from patient, is requesting Transfer of Care from [place], she is requesting a female physician did not specify which one. Will be signing medical release and sending it to [place].

## 2022-12-08 NOTE — Telephone Encounter (Signed)
Patient aware that Rx sent to pharmacy  

## 2022-12-15 ENCOUNTER — Ambulatory Visit: Payer: PPO

## 2022-12-15 DIAGNOSIS — I10 Essential (primary) hypertension: Secondary | ICD-10-CM

## 2022-12-15 DIAGNOSIS — E1165 Type 2 diabetes mellitus with hyperglycemia: Secondary | ICD-10-CM

## 2022-12-15 NOTE — Progress Notes (Signed)
Care Management & Coordination Services Pharmacy Note  12/15/2022 Name:  [name] MRN:  [id number] DOB:  1971-02-11  Summary: Patient presents for follow-up consult.   Patient tolerating medications well, home blood pressure and blood sugars remain well controlled.   Recommendations/Changes made from today's visit: Continue current medications  Follow up plan: CPP follow-up 9 months.    Subjective: [name] is an 52 y.o. year old female who is a primary patient of Mliss Sax, MD.  The care coordination team was consulted for assistance with disease management and care coordination needs.    Engaged with patient by telephone for follow up visit.  Recent office visits: 11/30/22: Patient presented to Alysia Penna, NP for UTI.   Recent consult visits: 11/08/22: Patient presented to Jari Sportsman, PA-C for follow-up.   Hospital visits: 10/28/22: Patient presented to ED for Influenza.   Admitted to the hospital on 08/06/2022 due to Puncture wound of right foot . Discharge date was 08/06/2022. Discharged from 08/06/2022 Hospital.     Objective:  Lab Results  Component Value Date   CREATININE 0.72 10/28/2022   BUN 8 10/28/2022   GFR 102.31 10/29/2021   GFRNONAA >60 10/28/2022   GFRAA >60 12/30/2019   NA 135 10/28/2022   K 3.9 10/28/2022   CALCIUM 9.2 10/28/2022   CO2 26 10/28/2022   GLUCOSE 94 10/28/2022    Lab Results  Component Value Date/Time   HGBA1C 5.4 04/20/2022 09:53 AM   HGBA1C 5.5 10/29/2021 11:25 AM   GFR 102.31 10/29/2021 11:25 AM   GFR 91.16 06/21/2021 11:50 AM   MICROALBUR <0.7 12/16/2020 11:37 AM    Last diabetic Eye exam:  Lab Results  Component Value Date/Time   HMDIABEYEEXA No Retinopathy 07/13/2021 12:00 AM    Last diabetic Foot exam: No results found for: "HMDIABFOOTEX"   Lab Results  Component Value Date   CHOL 135 10/29/2021   HDL 38.30 (L) 10/29/2021   LDLCALC 76 10/29/2021   LDLDIRECT 93.0 12/16/2020   TRIG  103.0 10/29/2021   CHOLHDL 4 10/29/2021       Latest Ref Rng & Units 10/28/2022   12:55 PM 12/16/2020   11:37 AM 10/08/2020   12:03 PM  Hepatic Function  Total Protein 6.5 - 8.1 g/dL 7.1  6.6  6.8   Albumin 3.5 - 5.0 g/dL 4.2  3.9  4.0   AST 15 - 41 U/L 18  11  15    ALT 0 - 44 U/L 28  17  29    Alk Phosphatase 38 - 126 U/L 69  87  87   Total Bilirubin 0.3 - 1.2 mg/dL 0.9  0.3  0.4     Lab Results  Component Value Date/Time   TSH 0.84 11/18/2021 10:10 AM   TSH 1.09 06/21/2021 11:50 AM   FREET4 0.92 04/02/2020 11:14 AM   FREET4 1.00 04/16/2015 06:24 AM       Latest Ref Rng & Units 10/28/2022   12:55 PM 04/19/2022    9:21 PM 04/18/2022    2:38 PM  CBC  WBC 4.0 - 10.5 K/uL 8.3  8.9  9.0   Hemoglobin 12.0 - 15.0 g/dL 06.3  01.6  01.0   Hematocrit 36.0 - 46.0 % 40.7  43.0  40.0   Platelets 150 - 400 K/uL 292  315  340     Lab Results  Component Value Date/Time   VITAMINB12 367 10/29/2021 11:25 AM   VITAMINB12 390 06/21/2021 11:50 AM    Clinical  ASCVD: No  The 10-year ASCVD risk score (Arnett DK, et al., 2019) is: 5.9%   Values used to calculate the score:     Age: 5 years     Sex: Female     Is Non-Hispanic African American: No     Diabetic: Yes     Tobacco smoker: Yes     Systolic Blood Pressure: 110 mmHg     Is BP treated: Yes     HDL Cholesterol: 38.3 mg/dL     Total Cholesterol: 135 mg/dL       9/60/4540   98:11 AM 10/14/2022   10:34 AM 04/19/2022    4:26 PM  Depression screen PHQ 2/9  Decreased Interest 1 0 0  Down, Depressed, Hopeless 1 0 0  PHQ - 2 Score 2 0 0  Altered sleeping 3    Tired, decreased energy 2    Change in appetite 0    Feeling bad or failure about yourself  3    Trouble concentrating 3    Moving slowly or fidgety/restless 0    Suicidal thoughts 0    PHQ-9 Score 13    Difficult doing work/chores Somewhat difficult  Not difficult at all     Social History   Tobacco Use  Smoking Status Former   Packs/day: 1.00   Years: 15.00    Additional pack years: 0.00   Total pack years: 15.00   Types: Cigarettes   Quit date: 10/04/2021   Years since quitting: 1.1  Smokeless Tobacco Former  Tobacco Comments   use vapor, social   BP Readings from Last 3 Encounters:  11/30/22 110/70  10/28/22 110/64  08/06/22 113/69   Pulse Readings from Last 3 Encounters:  11/30/22 73  10/28/22 (!) 56  08/06/22 67   Wt Readings from Last 3 Encounters:  11/30/22 161 lb (73 kg)  10/14/22 159 lb (72.1 kg)  08/06/22 163 lb 2.3 oz (74 kg)   BMI Readings from Last 3 Encounters:  11/30/22 27.64 kg/m  10/14/22 27.29 kg/m  08/06/22 28.00 kg/m    Allergies  Allergen Reactions   Pregabalin Anaphylaxis   Bupropion Hcl Other (See Comments)   Celecoxib Nausea And Vomiting and Other (See Comments)    Stomach pain   Effexor [Venlafaxine Hcl] Other (See Comments)    Serotonin toxicity   Oxybutynin Other (See Comments)    Urinary retention   Pramipexole Other (See Comments)    Mirapex- Worsening "body jerks"   Pramipexole Dihydrochloride Other (See Comments)   Pregabalin Other (See Comments)   Prozac [Fluoxetine Hcl] Swelling   Symbyax [Olanzapine-Fluoxetine Hcl] Swelling   Venlafaxine Other (See Comments)   Desvenlafaxine Other (See Comments) and Rash    Serotonin toxicity    Medications Reviewed Today     Reviewed by Anne Ng, NP (Nurse Practitioner) on 12/01/22 at [phone]  Med List Status: <None>   Medication Order Taking? Sig Documenting Provider Last Dose Status Informant  ALPRAZolam (XANAX) 0.5 MG tablet [id number] Yes Take 0.5 mg by mouth 3 (three) times daily as needed for anxiety. [provider] Taking Active Self, Pharmacy Records           Med Note Lauretta Chester, Cherene Dobbins A   Thu Dec 09, 2021 11:58 AM)    ARIPiprazole (ABILIFY) 5 MG tablet [id number] Yes Take 1 tablet (5 mg total) by mouth daily. Benjaman Pott, MD Taking Active Self, Pharmacy Records  aspirin-acetaminophen-caffeine [facility] MIGRAINE)  [phone] MG tablet [id number] Yes Take 1 tablet by mouth  every 6 (six) hours as needed for migraine. [provider] Taking Active Self, Pharmacy Records  atorvastatin (LIPITOR) 20 MG tablet [id number] Yes TAKE 1 TABLET(20 MG) BY MOUTH DAILY Mliss Sax, MD Taking Active   blood glucose meter kit and supplies KIT [id number] Yes Dispense based on patient and insurance preference. Check glucose before breakfast. ICD: E11.65 Nche, Bonna Gains, NP Taking Active Self, Pharmacy Records  cephALEXin (KEFLEX) 500 MG capsule [id number] No Take 1 capsule (500 mg total) by mouth 4 (four) times daily.  Patient not taking: Reported on 10/14/2022   Melton Alar R, PA Not Taking Active   diclofenac Sodium (VOLTAREN) 1 % GEL [id number] Yes Apply 2 g topically daily as needed. Mliss Sax, MD Taking Active   FINACEA 15 % Erlene Quan [id number] Yes Apply 1 application  topically 2 (two) times daily as needed (rosacea). [provider] Taking Active Self, Pharmacy Records           Med Note Jenne Pane, TEQUILA   Wed Apr 08, 2020 11:05 AM) PRN  gabapentin (NEURONTIN) 300 MG capsule [id number] Yes TAKE 1 CAPSULE(300 MG) BY MOUTH THREE TIMES DAILY Mliss Sax, MD Taking Active   glucose blood test strip [id number] Yes Use as instructed, check glucose before breakfast. ICD: E11.65 Mliss Sax, MD Taking Active Self, Pharmacy Records  lamoTRIgine (LAMICTAL) 150 MG tablet [id number] Yes Take 300 mg by mouth daily. [provider] Taking Active Self, Pharmacy Records           Med Note Lauretta Chester, Coy Saunas A   Thu Dec 09, 2021 12:08 PM) Lamotrigine 150 mg 2 tablets daily  Lancets (FREESTYLE) lancets [id number] Yes Use as instructed. E11.65 Mliss Sax, MD Taking Active Self, Pharmacy Records  levothyroxine (SYNTHROID) 75 MCG tablet [id number] Yes TAKE 1 TABLET(75 MCG) BY MOUTH DAILY BEFORE BREAKFAST  Patient taking differently: Take 75 mcg by mouth daily before  breakfast.   Mliss Sax, MD Taking Active Self, Pharmacy Records  lisinopril (ZESTRIL) 20 MG tablet [id number] Yes Take 1 tablet (20 mg total) by mouth daily. Mliss Sax, MD Taking Active Self, Pharmacy Records  metFORMIN (GLUCOPHAGE) 500 MG tablet [id number] Yes Take 1 tablet (500 mg total) by mouth 2 (two) times daily with a meal. Needs appointment for further refills. Mliss Sax, MD Taking Active   methylphenidate (RITALIN) 20 MG tablet [id number] Yes Take 20 mg by mouth 2 (two) times daily. [provider] Taking Active Self, Pharmacy Records           Med Note Lauretta Chester, Mizraim Harmening A   Thu Dec 09, 2021 11:56 AM) Leanora Ivanoff PRN  nitrofurantoin, macrocrystal-monohydrate, (MACROBID) 100 MG capsule [id number] Yes Take 1 capsule (100 mg total) by mouth 2 (two) times daily. Anne Ng, NP  Active   pantoprazole (PROTONIX) 40 MG tablet [id number] Yes Take 1 tablet by mouth daily. [provider] Taking Active Self, Pharmacy Records  polyethylene glycol powder (GLYCOLAX/MIRALAX) 17 GM/SCOOP powder [id number] Yes Take 17 g by mouth daily. Nche, Bonna Gains, NP  Active   Semaglutide,0.25 or 0.5MG /DOS, (OZEMPIC, 0.25 OR 0.5 MG/DOSE,) 2 MG/3ML SOPN [id number] Yes INJECT 0.25 MG SUBCUTANEOUSLY EVERY THURSDAY Mliss Sax, MD Taking Active             SDOH:  (Social Determinants of Health) assessments and interventions performed: Yes SDOH Interventions    Flowsheet Row Telephone from 10/17/2022 in [facility] Community Care Coordination Clinical Support from 10/14/2022 in [facility]  [place] Telephone from [facility]20[facility] [place] Telephone from [facility]/29[facility]20[facility] Management from [facility]20[facility] [place] Clinical Support from [facility]/13/2021 [facility] [place]  SDOH Interventions        Food Insecurity Interventions [id number] Referral Intervention Not Indicated -- Other (Comment)  [facility][Emailed [place] food pantry list.] Other (Comment) [id number] Referral  Housing Interventions -- -- -- -- -- Intervention Not Indicated  Transportation Interventions -- AMB Referral Other (Comment)  [Mailed application and brochure for [employer] transportation] Other (Comment)  [Emailed information and application for [employer] transportation] Other (Comment) [id number] Referral  Utilities Interventions [id number] Referral -- -- -- -- --  Financial Strain Interventions [id number] Referral Other (Comment)  [care guide referral] -- -- -- [id number]-- [id number] Referral  Physical Activity Interventions -- Patient Refused, Other (Comments) -- -- -- Intervention Not Indicated  Stress Interventions -- Other (Comment) -- -- -- Other (Comment)  [she takes meds and sees psych]  Social Connections Interventions -- -- -- -- -- Intervention Not Indicated       Medication Assistance: None required.  Patient affirms current coverage meets needs.  Medication Access: Within the past 30 days, how often has patient missed a dose of medication? None Is a pillbox or other method used to improve adherence? Yes  Factors that may affect medication adherence? no barriers identified Are meds synced by current pharmacy? No  Are meds delivered by current pharmacy? No  Does patient experience delays in picking up medications due to transportation concerns? Yes   [facility] Reviewed: Is patient disadvantaged to use [facility]?: Yes  Current Rx insurance plan: HTA Name and location of Current pharmacy:  [facility] [place] [address] [place] [address] Phone: [phone] Fax: [phone]  [facility] services reviewed with patient today?: No  Patient requests to transfer care to [facility]?: No   Reason patient declined to change pharmacies: Disadvantaged due to insurance/mail order and Loyalty to other pharmacy/Patient preference  Compliance/Adherence/Medication fill history: Care Gaps: [facility]  Ophthalmology   Star-Rating Drugs: Atorvastatin 20 mg: Last filled 10/03/22 for 90-DS  Lisinopril 10 mg: Last filled 12/08/22 for 90-DS  Metformin 500 mg: Last filled 10/26/22 for 90-DS   Assessment/Plan  Hypertension (BP goal <130/80) -Controlled -Current treatment: Lisinopril 10 mg daily: Appropriate, Effective, Safe, Accessible -Medications previously tried: NA  -Denies hypotensive/hypertensive symptoms -Current home readings: "low hundreds over 60s"  -Recommended to continue current medication  Hyperlipidemia: (LDL goal < 70) -Uncontrolled -Current treatment: Atorvastatin 20 mg daily: Appropriate, Effective, Safe, Accessible  -Medications previously tried: NA  -Current exercise habits: Minimal  -Recommended to continue current medication  Diabetes (A1c goal <7%) -Controlled -Current medications: Metformin 500 mg twice daily with meals: Appropriate, Effective, Safe, Accessible  Ozempic 0.25 mg weekly: Appropriate, Effective, Safe, Accessible  -Medications previously tried: NA  -Current home glucose readings Fasting: 101  -Denies hypoglycemic/hyperglycemic symptoms -Recommended to continue current medication  Depression/Anxiety (Goal: Maintain symptom relief) -Controlled -Managed by Leone PayorMontague, Crystal, FNP -Current treatment: Alprazolam 0.5 mg twice daily as needed: Appropriate, Effective, Safe, Accessible  Takes 2-3 times daily  Aripiprazole 5 mg daily: Appropriate, Effective, Safe, Accessible  Lamotrigine 300 mg daily  Methylphenidate 20 mg twice daily: Appropriate, Effective, Safe, Accessible  Skips PM dose  Trazodone 100 mg nightly  -Medications previously tried/failed: Buspar, citalopram  -  Working on establishing with behavioral health at [facility]   -Recommended to continue current medication  Hypothyroidism (Goal: Maintain symptom remission) -Controlled -Current treatment  Levothyroxine 75 mcg daily: Appropriate, Effective, Safe, Accessible  -Medications previously tried: NA  -Recommended to continue current medication  GERD (Goal: Minimize symptoms) -Controlled -Current treatment  Pantoprazole 40 mg daily: Appropriate, Effective, Safe, Accessible -Medications previously tried: NA  -Counseled on trigger avoidance.  -Recommended to continue current medication  Follow Up Plan: Telephone follow up appointment with care management team member scheduled for:  09/14/2023 at 11:00 AM  Angelena Sole, PharmD, Patsy [place], CPP Clinical Pharmacist Practitioner  [facility] at [facility]  [phone]

## 2022-12-21 ENCOUNTER — Ambulatory Visit (INDEPENDENT_AMBULATORY_CARE_PROVIDER_SITE_OTHER): Payer: PPO | Admitting: Family Medicine

## 2022-12-21 ENCOUNTER — Encounter: Payer: Self-pay | Admitting: Family Medicine

## 2022-12-21 VITALS — BP 116/62 | HR 70 | Temp 98.6°F | Resp 16 | Ht 64.0 in | Wt 158.1 lb

## 2022-12-21 DIAGNOSIS — E538 Deficiency of other specified B group vitamins: Secondary | ICD-10-CM

## 2022-12-21 DIAGNOSIS — E039 Hypothyroidism, unspecified: Secondary | ICD-10-CM

## 2022-12-21 DIAGNOSIS — F439 Reaction to severe stress, unspecified: Secondary | ICD-10-CM | POA: Diagnosis not present

## 2022-12-21 DIAGNOSIS — E1165 Type 2 diabetes mellitus with hyperglycemia: Secondary | ICD-10-CM

## 2022-12-21 DIAGNOSIS — E7800 Pure hypercholesterolemia, unspecified: Secondary | ICD-10-CM | POA: Diagnosis not present

## 2022-12-21 DIAGNOSIS — R5383 Other fatigue: Secondary | ICD-10-CM

## 2022-12-21 DIAGNOSIS — I10 Essential (primary) hypertension: Secondary | ICD-10-CM

## 2022-12-21 DIAGNOSIS — Z780 Asymptomatic menopausal state: Secondary | ICD-10-CM | POA: Diagnosis not present

## 2022-12-21 DIAGNOSIS — N912 Amenorrhea, unspecified: Secondary | ICD-10-CM | POA: Diagnosis not present

## 2022-12-21 DIAGNOSIS — K219 Gastro-esophageal reflux disease without esophagitis: Secondary | ICD-10-CM

## 2022-12-21 DIAGNOSIS — E860 Dehydration: Secondary | ICD-10-CM | POA: Diagnosis not present

## 2022-12-21 DIAGNOSIS — Z5982 Transportation insecurity: Secondary | ICD-10-CM | POA: Diagnosis not present

## 2022-12-21 LAB — LDL CHOLESTEROL, DIRECT: Direct LDL: 61 mg/dL

## 2022-12-21 LAB — URINALYSIS, ROUTINE W REFLEX MICROSCOPIC
Bilirubin Urine: NEGATIVE
Ketones, ur: NEGATIVE
Nitrite: NEGATIVE
Specific Gravity, Urine: 1.015 (ref 1.000–1.030)
Total Protein, Urine: NEGATIVE
Urine Glucose: NEGATIVE
Urobilinogen, UA: 0.2 (ref 0.0–1.0)
pH: 7.5 (ref 5.0–8.0)

## 2022-12-21 LAB — MICROALBUMIN / CREATININE URINE RATIO
Creatinine,U: 70.3 mg/dL
Microalb Creat Ratio: 1 mg/g (ref 0.0–30.0)
Microalb, Ur: <0.7 mg/dL (ref 0.0–1.9)

## 2022-12-21 LAB — COMPREHENSIVE METABOLIC PANEL
ALT: 18 U/L (ref 0–35)
AST: 13 U/L (ref 0–37)
Albumin: 4.2 g/dL (ref 3.5–5.2)
Alkaline Phosphatase: 54 U/L (ref 39–117)
BUN: 8 mg/dL (ref 6–23)
CO2: 28 mEq/L (ref 19–32)
Calcium: 9.3 mg/dL (ref 8.4–10.5)
Chloride: 102 mEq/L (ref 96–112)
Creatinine, Ser: 0.67 mg/dL (ref 0.40–1.20)
GFR: 102.21 mL/min (ref >60.00)
Glucose, Bld: 81 mg/dL (ref 70–99)
Potassium: 4 mEq/L (ref 3.5–5.1)
Sodium: 139 mEq/L (ref 135–145)
Total Bilirubin: 0.5 mg/dL (ref 0.2–1.2)
Total Protein: 6.3 g/dL (ref 6.0–8.3)

## 2022-12-21 LAB — CBC
HCT: 37 % (ref 36.0–46.0)
Hemoglobin: 12.8 g/dL (ref 12.0–15.0)
MCHC: 34.5 g/dL (ref 30.0–36.0)
MCV: 85.5 fl (ref 78.0–100.0)
Platelets: 261 10*3/uL (ref 150.0–400.0)
RBC: 4.33 Mil/uL (ref 3.87–5.11)
RDW: 14.4 % (ref 11.5–15.5)
WBC: 7.8 10*3/uL (ref 4.0–10.5)

## 2022-12-21 LAB — VITAMIN B12: Vitamin B-12: 492 pg/mL (ref 211–911)

## 2022-12-21 LAB — FOLLICLE STIMULATING HORMONE: FSH: 4.5 m[IU]/mL

## 2022-12-21 LAB — HEMOGLOBIN A1C: Hgb A1c MFr Bld: 5 % (ref 4.6–6.5)

## 2022-12-21 LAB — TSH: TSH: 0.84 u[IU]/mL (ref 0.35–5.50)

## 2022-12-21 MED ORDER — PANTOPRAZOLE SODIUM 40 MG PO TBEC: 40.0000 mg | DELAYED_RELEASE_TABLET | Freq: Every day | ORAL | 1 refills | Status: DC

## 2022-12-21 NOTE — Progress Notes (Signed)
Established Patient Office Visit   Subjective:  Patient ID: [name], female    DOB: September 03, 1971  Age: 52 y.o. MRN: [id number]  Chief Complaint  Patient presents with   cold intolerance    Unable to eat, insomnia  Requesting full blood work, lightheadedness when standing intermittently    HPI Encounter Diagnoses  Name Primary?   Type 2 diabetes mellitus with hyperglycemia, without long-term current use of insulin Yes   Adult hypothyroidism    B12 deficiency    Essential hypertension    Menopause    Amenorrhea    Other fatigue    Dehydration    Transportation insecurity    Elevated cholesterol    Stress at home    Gastroesophageal reflux disease without esophagitis    Comes in with ongoing malaise.  She is stressed.  There are multiple issues at home.  She is without a car and has transportation difficulties.  Amenorrhea for 6 months but has just began with a heavy.  Status post tubal ligation and uterine ablation.  She is due for follow-up with her GYN but has difficulty getting there.  Due for follow-up with GI but has the same issue with transportation.     Review of Systems  Constitutional: Negative.   HENT: Negative.    Eyes:  Negative for blurred vision, discharge and redness.  Respiratory: Negative.    Cardiovascular: Negative.   Gastrointestinal:  Negative for abdominal pain.  Genitourinary: Negative.  Negative for dysuria, frequency, hematuria and urgency.  Musculoskeletal: Negative.  Negative for myalgias.  Skin:  Negative for rash.  Neurological:  Negative for tingling, loss of consciousness and weakness.  Endo/Heme/Allergies:  Negative for polydipsia.  Psychiatric/Behavioral:  The patient is nervous/anxious.      Current Outpatient Medications:    ALPRAZolam (XANAX) 0.5 MG tablet, Take 0.5 mg by mouth 3 (three) times daily as needed for anxiety., Disp: , Rfl:    ARIPiprazole (ABILIFY) 5 MG tablet, Take 1 tablet (5 mg total) by mouth daily., Disp: 30  tablet, Rfl: 2   aspirin-acetaminophen-caffeine (EXCEDRIN MIGRAINE) 250-250-65 MG tablet, Take 1 tablet by mouth every 6 (six) hours as needed for migraine., Disp: , Rfl:    atorvastatin (LIPITOR) 20 MG tablet, TAKE 1 TABLET(20 MG) BY MOUTH DAILY, Disp: 100 tablet, Rfl: 3   blood glucose meter kit and supplies KIT, Dispense based on patient and insurance preference. Check glucose before breakfast. ICD: E11.65, Disp: 1 each, Rfl: 0   diclofenac Sodium (VOLTAREN) 1 % GEL, Apply 2 g topically daily as needed., Disp: 150 g, Rfl: 1   FINACEA 15 % FOAM, Apply 1 application  topically 2 (two) times daily as needed (rosacea)., Disp: , Rfl:    gabapentin (NEURONTIN) 300 MG capsule, TAKE 1 CAPSULE(300 MG) BY MOUTH THREE TIMES DAILY, Disp: 90 capsule, Rfl: 2   glucose blood test strip, Use as instructed, check glucose before breakfast. ICD: E11.65, Disp: 100 each, Rfl: 12   lamoTRIgine (LAMICTAL) 150 MG tablet, Take 300 mg by mouth daily., Disp: , Rfl:    Lancets (FREESTYLE) lancets, Use as instructed. E11.65, Disp: 100 each, Rfl: 12   levothyroxine (SYNTHROID) 75 MCG tablet, TAKE 1 TABLET(75 MCG) BY MOUTH DAILY BEFORE BREAKFAST (Patient taking differently: Take 75 mcg by mouth daily before breakfast.), Disp: 90 tablet, Rfl: 3   lisinopril (ZESTRIL) 10 MG tablet, Take 1 tablet (10 mg total) by mouth daily., Disp: 90 tablet, Rfl: 1   metFORMIN (GLUCOPHAGE) 500 MG tablet, Take 1  tablet (500 mg total) by mouth 2 (two) times daily with a meal. Needs appointment for further refills., Disp: 60 tablet, Rfl: 0   methylphenidate (RITALIN) 20 MG tablet, Take 20 mg by mouth 2 (two) times daily., Disp: , Rfl:    polyethylene glycol powder (GLYCOLAX/MIRALAX) 17 GM/SCOOP powder, Take 17 g by mouth daily., Disp: 850 g, Rfl: 3   Semaglutide,0.25 or 0.5MG /DOS, (OZEMPIC, 0.25 OR 0.5 MG/DOSE,) 2 MG/3ML SOPN, INJECT 0.25 MG SUBCUTANEOUSLY EVERY THURSDAY, Disp: 3 mL, Rfl: 2   traZODone (DESYREL) 100 MG tablet, Take 100 mg by mouth  at bedtime., Disp: , Rfl:    pantoprazole (PROTONIX) 40 MG tablet, Take 1 tablet (40 mg total) by mouth daily., Disp: 90 tablet, Rfl: 1   Objective:     BP 116/62   Pulse 70   Temp 98.6 F (37 C) (Oral)   Resp 16   Ht  (1.626 m)   Wt 158 lb 2 oz (71.7 kg)   LMP 11/23/2022   SpO2 97%   BMI 27.14 kg/m  Wt Readings from Last 3 Encounters:  12/21/22 158 lb 2 oz (71.7 kg)  11/30/22 161 lb (73 kg)  10/14/22 159 lb (72.1 kg)      Physical Exam Constitutional:      General: She is not in acute distress.    Appearance: Normal appearance. She is not ill-appearing, toxic-appearing or diaphoretic.  HENT:     Head: Normocephalic and atraumatic.     Right Ear: External ear normal.     Left Ear: External ear normal.     Mouth/Throat:     Mouth: Mucous membranes are moist.     Pharynx: Oropharynx is clear. No oropharyngeal exudate or posterior oropharyngeal erythema.  Eyes:     General: No scleral icterus.       Right eye: No discharge.        Left eye: No discharge.     Extraocular Movements: Extraocular movements intact.     Conjunctiva/sclera: Conjunctivae normal.     Pupils: Pupils are equal, round, and reactive to light.  Cardiovascular:     Rate and Rhythm: Normal rate and regular rhythm.  Pulmonary:     Effort: Pulmonary effort is normal. No respiratory distress.     Breath sounds: Normal breath sounds.  Abdominal:     General: Bowel sounds are normal.  Musculoskeletal:     Cervical back: No rigidity or tenderness.  Skin:    General: Skin is warm and dry.  Neurological:     Mental Status: She is alert and oriented to person, place, and time.  Psychiatric:        Mood and Affect: Mood normal.        Behavior: Behavior normal.    Diabetic Foot Exam - Simple   Simple Foot Form Diabetic Foot exam was performed with the following findings: Yes 12/21/2022 10:06 AM  Visual Inspection No deformities, no ulcerations, no other skin breakdown bilaterally:  Yes Sensation Testing Intact to touch and monofilament testing bilaterally: Yes Pulse Check Posterior Tibialis and Dorsalis pulse intact bilaterally: Yes Comments      No results found for any visits on 12/21/22.    The 10-year ASCVD risk score (Arnett DK, et al., 2019) is: 6.9%    Assessment & Plan:   Type 2 diabetes mellitus with hyperglycemia, without long-term current use of insulin -     Comprehensive metabolic panel -     Hemoglobin A1c -  Urinalysis, Routine w reflex microscopic -     Microalbumin / creatinine urine ratio  Adult hypothyroidism -     TSH  B12 deficiency -     Vitamin B12  Essential hypertension -     CBC -     Comprehensive metabolic panel  Menopause -     Follicle stimulating hormone  Amenorrhea -     Follicle stimulating hormone  Other fatigue  Dehydration -     Urinalysis, Routine w reflex microscopic  Transportation insecurity  Elevated cholesterol -     Comprehensive metabolic panel -     LDL cholesterol, direct  Stress at home  Gastroesophageal reflux disease without esophagitis -     Pantoprazole Sodium; Take 1 tablet (40 mg total) by mouth daily.  Dispense: 90 tablet; Refill: 1    Return in about 6 months (around 06/22/2023).    Mliss Sax, MD

## 2022-12-23 ENCOUNTER — Telehealth: Payer: Self-pay | Admitting: Family Medicine

## 2022-12-23 DIAGNOSIS — F909 Attention-deficit hyperactivity disorder, unspecified type: Secondary | ICD-10-CM | POA: Diagnosis not present

## 2022-12-23 DIAGNOSIS — F3132 Bipolar disorder, current episode depressed, moderate: Secondary | ICD-10-CM | POA: Diagnosis not present

## 2022-12-23 DIAGNOSIS — F411 Generalized anxiety disorder: Secondary | ICD-10-CM | POA: Diagnosis not present

## 2022-12-23 DIAGNOSIS — F429 Obsessive-compulsive disorder, unspecified: Secondary | ICD-10-CM | POA: Diagnosis not present

## 2022-12-23 NOTE — Telephone Encounter (Signed)
Called patient went over labs, no concerns.  

## 2022-12-23 NOTE — Telephone Encounter (Signed)
Pt would like a call back about her labs. [place] [phone]

## 2023-01-02 ENCOUNTER — Telehealth: Payer: Self-pay | Admitting: Family Medicine

## 2023-01-02 ENCOUNTER — Ambulatory Visit (INDEPENDENT_AMBULATORY_CARE_PROVIDER_SITE_OTHER): Payer: PPO | Admitting: Family Medicine

## 2023-01-02 ENCOUNTER — Encounter: Payer: Self-pay | Admitting: Family Medicine

## 2023-01-02 VITALS — Temp 98.2°F | Ht 64.0 in | Wt 153.4 lb

## 2023-01-02 DIAGNOSIS — Z1211 Encounter for screening for malignant neoplasm of colon: Secondary | ICD-10-CM

## 2023-01-02 DIAGNOSIS — I959 Hypotension, unspecified: Secondary | ICD-10-CM

## 2023-01-02 NOTE — Telephone Encounter (Signed)
Caller Name: [name] Ph #: [phone] Chief Complaint: got dizzy passed out bumped her head yesterday.   This call was transferred to Nurse Triage/Access Nurse. This is for documentation purposes. No follow up required at this time.

## 2023-01-02 NOTE — Progress Notes (Signed)
Established Patient Office Visit   Subjective:  Patient ID: [name], female    DOB: [birth year]  Age: 52 y.o. MRN: [id number]  Chief Complaint  Patient presents with   Dizziness    Dizziness x 2 weeks stopped taking BP pills 3 days ago due to dizziness had a fall 2 days ago due to dizziness.     Dizziness Pertinent negatives include no abdominal pain, myalgias, rash or weakness.   Encounter Diagnoses  Name Primary?   Hypotension, unspecified hypotension type Yes   Screening for colon cancer    Blood pressure has been falling over the last few weeks.  Status post health check blood workup reviewed weeks ago.  Essentially everything was normal.  She has discontinued her lisinopril.  She is under a lot of stress at home.  She has lost 8 pounds over the last month.  Just has not had an appetite.   Review of Systems  Constitutional: Negative.   HENT: Negative.    Eyes:  Negative for blurred vision, discharge and redness.  Respiratory: Negative.    Cardiovascular: Negative.   Gastrointestinal:  Negative for abdominal pain, blood in stool and melena.  Genitourinary: Negative.  Negative for dysuria, frequency, hematuria and urgency.  Musculoskeletal: Negative.  Negative for myalgias.  Skin:  Negative for rash.  Neurological:  Positive for dizziness. Negative for tingling, loss of consciousness and weakness.  Endo/Heme/Allergies:  Negative for polydipsia.  Psychiatric/Behavioral:  The patient is nervous/anxious.      Current Outpatient Medications:    ALPRAZolam (XANAX) 0.5 MG tablet, Take 0.5 mg by mouth 3 (three) times daily as needed for anxiety., Disp: , Rfl:    ARIPiprazole (ABILIFY) 5 MG tablet, Take 1 tablet (5 mg total) by mouth daily., Disp: 30 tablet, Rfl: 2   aspirin-acetaminophen-caffeine (EXCEDRIN MIGRAINE) 250-250-65 MG tablet, Take 1 tablet by mouth every 6 (six) hours as needed for migraine., Disp: , Rfl:    atorvastatin (LIPITOR) 20 MG tablet, TAKE 1  TABLET(20 MG) BY MOUTH DAILY, Disp: 100 tablet, Rfl: 3   blood glucose meter kit and supplies KIT, Dispense based on patient and insurance preference. Check glucose before breakfast. ICD: E11.65, Disp: 1 each, Rfl: 0   diclofenac Sodium (VOLTAREN) 1 % GEL, Apply 2 g topically daily as needed., Disp: 150 g, Rfl: 1   FINACEA 15 % FOAM, Apply 1 application  topically 2 (two) times daily as needed (rosacea)., Disp: , Rfl:    gabapentin (NEURONTIN) 300 MG capsule, TAKE 1 CAPSULE(300 MG) BY MOUTH THREE TIMES DAILY, Disp: 90 capsule, Rfl: 2   glucose blood test strip, Use as instructed, check glucose before breakfast. ICD: E11.65, Disp: 100 each, Rfl: 12   lamoTRIgine (LAMICTAL) 150 MG tablet, Take 300 mg by mouth daily., Disp: , Rfl:    Lancets (FREESTYLE) lancets, Use as instructed. E11.65, Disp: 100 each, Rfl: 12   levothyroxine (SYNTHROID) 75 MCG tablet, TAKE 1 TABLET(75 MCG) BY MOUTH DAILY BEFORE BREAKFAST (Patient taking differently: Take 75 mcg by mouth daily before breakfast.), Disp: 90 tablet, Rfl: 3   lisinopril (ZESTRIL) 10 MG tablet, Take 1 tablet (10 mg total) by mouth daily., Disp: 90 tablet, Rfl: 1   metFORMIN (GLUCOPHAGE) 500 MG tablet, Take 1 tablet (500 mg total) by mouth 2 (two) times daily with a meal. Needs appointment for further refills., Disp: 60 tablet, Rfl: 0   pantoprazole (PROTONIX) 40 MG tablet, Take 1 tablet (40 mg total) by mouth daily., Disp: 90 tablet, Rfl: 1  polyethylene glycol powder (GLYCOLAX/MIRALAX) 17 GM/SCOOP powder, Take 17 g by mouth daily., Disp: 850 g, Rfl: 3   Semaglutide,0.25 or 0.5MG /DOS, (OZEMPIC, 0.25 OR 0.5 MG/DOSE,) 2 MG/3ML SOPN, INJECT 0.25 MG SUBCUTANEOUSLY EVERY THURSDAY, Disp: 3 mL, Rfl: 2   traZODone (DESYREL) 100 MG tablet, Take 100 mg by mouth at bedtime., Disp: , Rfl:    methylphenidate (RITALIN) 20 MG tablet, Take 20 mg by mouth 2 (two) times daily. (Patient not taking: Reported on 01/02/2023), Disp: , Rfl:    Objective:     Temp 98.2 F  (36.8 C) (Tympanic)   Ht 5\' 4"  (1.626 m)   Wt 153 lb 6.4 oz (69.6 kg)   SpO2 97%   BMI 26.33 kg/m  Wt Readings from Last 3 Encounters:  01/02/23 153 lb 6.4 oz (69.6 kg)  12/21/22 158 lb 2 oz (71.7 kg)  11/30/22 161 lb (73 kg)      Physical Exam Constitutional:      General: She is not in acute distress.    Appearance: Normal appearance. She is not ill-appearing, toxic-appearing or diaphoretic.  HENT:     Head: Normocephalic and atraumatic.     Right Ear: External ear normal.     Left Ear: External ear normal.     Mouth/Throat:     Mouth: Mucous membranes are dry.     Pharynx: Oropharynx is clear. No oropharyngeal exudate or posterior oropharyngeal erythema.  Eyes:     General: No scleral icterus.       Right eye: No discharge.        Left eye: No discharge.     Extraocular Movements: Extraocular movements intact.     Conjunctiva/sclera: Conjunctivae normal.     Pupils: Pupils are equal, round, and reactive to light.  Cardiovascular:     Rate and Rhythm: Normal rate and regular rhythm.  Pulmonary:     Effort: Pulmonary effort is normal. No respiratory distress.     Breath sounds: Normal breath sounds.  Abdominal:     General: Bowel sounds are normal.     Tenderness: There is no right CVA tenderness or left CVA tenderness.  Musculoskeletal:     Cervical back: No rigidity or tenderness.  Skin:    General: Skin is warm and dry.  Neurological:     Mental Status: She is alert and oriented to person, place, and time.  Psychiatric:        Mood and Affect: Mood normal.        Behavior: Behavior normal.      No results found for any visits on 01/02/23.    The 10-year ASCVD risk score (Arnett DK, et al., 2019) is: 6.9%    Assessment & Plan:   Hypotension, unspecified hypotension type  Screening for colon cancer -     Ambulatory referral to Gastroenterology    Return in about 4 weeks (around 01/30/2023), or Hold lisinopril..  Increase fluids.  Hold lisinopril.   Blood pressure is down secondary to  weight loss.  Follow-up in 1 month.  Mliss Sax, MD

## 2023-01-03 ENCOUNTER — Telehealth: Payer: Self-pay | Admitting: Family Medicine

## 2023-01-03 DIAGNOSIS — E039 Hypothyroidism, unspecified: Secondary | ICD-10-CM

## 2023-01-03 MED ORDER — LEVOTHYROXINE SODIUM 75 MCG PO TABS
75.0000 ug | ORAL_TABLET | Freq: Every day | ORAL | 2 refills | Status: DC
Start: 2023-01-03 — End: 2023-01-16

## 2023-01-03 NOTE — Telephone Encounter (Signed)
Prescription Request  01/03/2023  LOV: 01/02/2023  What is the name of the medication or equipment? levothyroxine   Have you contacted your pharmacy to request a refill? Yes   Which pharmacy would you like this sent to?  [facility] - [place], [place] - [address] [facility] [address] [place] [address] Phone: [phone] Fax: [phone]    Patient notified that their request is being sent to the clinical staff for review and that they should receive a response within 2 business days.   Please advise at Mobile [phone] (mobile)   pt said the pharmacy told her that a order have not been sent to pharmacy yet

## 2023-01-03 NOTE — Telephone Encounter (Signed)
Called patient to notify that RX was sent to the pharmacy.  Dm/cma

## 2023-01-03 NOTE — Telephone Encounter (Signed)
Appointment scheduled 01/02/23 for evaluation

## 2023-01-04 ENCOUNTER — Telehealth: Payer: Self-pay | Admitting: Family Medicine

## 2023-01-04 NOTE — Telephone Encounter (Signed)
Caller Name: Herbert Call back phone #: [phone]  Reason for Call: Pt was here on 4/29 and discussed her weight loss. She is concerned that she has lost 3 more since Monday and is there anything else that should be done?

## 2023-01-04 NOTE — Telephone Encounter (Signed)
Patient notified VIA phone.  No further questions.  Dm/cma  

## 2023-01-05 ENCOUNTER — Other Ambulatory Visit (HOSPITAL_COMMUNITY): Payer: Self-pay

## 2023-01-09 ENCOUNTER — Other Ambulatory Visit: Payer: Self-pay | Admitting: Family Medicine

## 2023-01-09 DIAGNOSIS — E039 Hypothyroidism, unspecified: Secondary | ICD-10-CM

## 2023-01-10 ENCOUNTER — Telehealth: Payer: Self-pay | Admitting: Family Medicine

## 2023-01-10 DIAGNOSIS — E039 Hypothyroidism, unspecified: Secondary | ICD-10-CM

## 2023-01-10 NOTE — Telephone Encounter (Signed)
Patient calling with concerns states that now that she is no longer on BP medication what can she take to protect her kidneys given that she is on Metformin and Ozempic? Please advise

## 2023-01-10 NOTE — Telephone Encounter (Signed)
Pt is wondering if there is a prescription she can be prescribed that helps protect her kidneys.  [facility] - [place], [place] - [address] [facility] [address] [place], [place] [address] Phone: [phone]  Fax: [phone]  Please advise pt at [phone]

## 2023-01-11 ENCOUNTER — Telehealth: Payer: Self-pay

## 2023-01-11 DIAGNOSIS — E1165 Type 2 diabetes mellitus with hyperglycemia: Secondary | ICD-10-CM

## 2023-01-11 NOTE — Telephone Encounter (Signed)
Spoke with patient who verbalized understanding of starting back on lisinopril low dose at upcoming visit per patient she will not have transportation to get to the appointment would like to know if she could have a prescription written and just start on medication today? Patient have canceled her appointment for 01/25/23. Please advise

## 2023-01-11 NOTE — Patient Instructions (Addendum)
Visit Information  Thank you for taking time to visit with me today. Please don't hesitate to contact me if I can be of assistance to you.   Following are the goals we discussed today:   Goals Addressed             This Visit's Progress    Transportation Resources/Diabetes Management       Patient Goals/Self Care Activities: -Patient/Caregiver will self-administer medications as prescribed as evidenced by self-report/primary caregiver report  -Patient/Caregiver will attend all scheduled provider appointments as evidenced by clinician review of documented attendance to scheduled appointments and patient/caregiver report -Patient/Caregiver will call provider office for new concerns or questions as evidenced by review of documented incoming telephone call notes and patient report  -check blood sugar at prescribed times -check blood sugar if I feel it is too high or too low -record values and write them down take them to all doctor visits    Patient currently having transportation issues. Patient cancelling appointments due to lack of transportation.  Referral made to care guide for transportation. Discussed [facility] RN CM. Patient agreeable for follow up.         Our next appointment is by telephone on 02/20/23 at 1200 pm  Please call the care guide team at [phone] if you need to cancel or reschedule your appointment.   If you are experiencing a Mental Health or Behavioral Health Crisis or need someone to talk to, please call the Suicide and Crisis Lifeline: 988   Patient verbalizes understanding of instructions and care plan provided today and agrees to view in MyChart. Active MyChart status and patient understanding of how to access instructions and care plan via MyChart confirmed with patient.     The patient has been provided with contact information for the care management team and has been advised to call with any health related questions or concerns.   Bary Leriche, RN,  MSN [facility] Care Management Care Management Coordinator Direct Line [phone]'

## 2023-01-11 NOTE — Telephone Encounter (Signed)
   Telephone encounter was:  Successful.  01/11/2023 Name: [name] MRN: [id number] DOB: [birth year]  [name] is a 52 y.o. year old female who is a primary care patient of Mliss Sax, MD . The community resource team was consulted for assistance with Transportation Needs   Care guide performed the following interventions: Patient provided with information about care guide support team and interviewed to confirm resource needs. Patient is Missing dr appointments due to no transportation. No transportation benefits with insurance. Patient will call and make an appointment and call me back to set up a ride   Follow Up Plan:  Care guide will follow up with patient by phone over the next day    [facility] Guide, [facility] [phone] [address] [place], [place], [place] [address] Phone: [phone] Email: [email or web address]

## 2023-01-12 ENCOUNTER — Telehealth: Payer: Self-pay

## 2023-01-12 NOTE — Telephone Encounter (Signed)
   Telephone encounter was:  Unsuccessful.  01/12/2023 Name: [name] MRN: [id number] DOB: Oct 16, 1970  Unsuccessful outbound call made today to assist with:  Transportation Needs  Mailing resources   Outreach Attempt:  3rd Attempt.  Referral closed unable to contact patient.  A HIPAA compliant voice message was left requesting a return call.  Instructed patient to call back. Mailed resources    [facility] Guide, [facility] [phone] [address] [place], [place], [place] [address] Phone: [phone] Email: [email or web address]

## 2023-01-13 NOTE — Telephone Encounter (Signed)
Pt would like her levothyroxine sent to [facility].

## 2023-01-16 ENCOUNTER — Other Ambulatory Visit: Payer: Self-pay

## 2023-01-16 DIAGNOSIS — E039 Hypothyroidism, unspecified: Secondary | ICD-10-CM

## 2023-01-16 MED ORDER — LEVOTHYROXINE SODIUM 75 MCG PO TABS
75.0000 ug | ORAL_TABLET | Freq: Every day | ORAL | 2 refills | Status: DC
Start: 2023-01-16 — End: 2023-01-25

## 2023-01-16 MED ORDER — LEVOTHYROXINE SODIUM 75 MCG PO TABS
75.0000 ug | ORAL_TABLET | Freq: Every day | ORAL | 2 refills | Status: DC
Start: 2023-01-16 — End: 2023-01-16

## 2023-01-16 NOTE — Addendum Note (Signed)
Addended by: Lake Bells on: 01/16/2023 03:17 PM   Modules accepted: Orders

## 2023-01-16 NOTE — Telephone Encounter (Signed)
Rx that was sent on 01/03/23 pharmacy never received resent to pharmacy patient aware

## 2023-01-23 DIAGNOSIS — F411 Generalized anxiety disorder: Secondary | ICD-10-CM | POA: Diagnosis not present

## 2023-01-23 DIAGNOSIS — F429 Obsessive-compulsive disorder, unspecified: Secondary | ICD-10-CM | POA: Diagnosis not present

## 2023-01-23 DIAGNOSIS — F3132 Bipolar disorder, current episode depressed, moderate: Secondary | ICD-10-CM | POA: Diagnosis not present

## 2023-01-23 DIAGNOSIS — F909 Attention-deficit hyperactivity disorder, unspecified type: Secondary | ICD-10-CM | POA: Diagnosis not present

## 2023-01-25 ENCOUNTER — Ambulatory Visit: Payer: PPO | Admitting: Family Medicine

## 2023-01-25 ENCOUNTER — Telehealth: Payer: Self-pay | Admitting: Family Medicine

## 2023-01-25 ENCOUNTER — Other Ambulatory Visit: Payer: Self-pay

## 2023-01-25 DIAGNOSIS — E039 Hypothyroidism, unspecified: Secondary | ICD-10-CM

## 2023-01-25 MED ORDER — LEVOTHYROXINE SODIUM 75 MCG PO TABS
75.0000 ug | ORAL_TABLET | Freq: Every day | ORAL | 2 refills | Status: DC
Start: 2023-01-25 — End: 2023-02-23

## 2023-01-25 NOTE — Telephone Encounter (Signed)
Called patient to inform that requested Rx was sent on 12/17/22 per pharmacy they never received it. Rx re faxed today patient aware to call pharmacy before she heads to pick up.

## 2023-01-25 NOTE — Telephone Encounter (Signed)
Prescription Request  01/25/2023  LOV: 01/02/2023  What is the name of the medication or equipment? levothyroxine (SYNTHROID) 75 MCG tablet [[id number]]   Have you contacted your pharmacy to request a refill? Yes    **PT has no pills for today 01/25/23**  Which pharmacy would you like this sent to?  [facility] - [place], [place] - [address] [facility] [address] [place] [address] Phone: [phone] Fax: [phone]    Please advise at Home   [phone] [facility])

## 2023-01-26 ENCOUNTER — Telehealth: Payer: Self-pay

## 2023-01-26 ENCOUNTER — Other Ambulatory Visit (HOSPITAL_COMMUNITY): Payer: Self-pay

## 2023-01-26 NOTE — Telephone Encounter (Signed)
Pharmacy Patient Advocate Encounter  Prior Authorization for Ozempic (0.25 or 0.5 MG/DOSE) 2MG /3ML pen has been approved by [employer] (ins).    PA #  [id number] Effective dates: 01/06/2023 through 01/06/2024  Spoke with Pharmacy to process. Copay is $11.20  Georga Bora Rx Patient Advocate [phone]

## 2023-02-02 LAB — HM DIABETES EYE EXAM

## 2023-02-07 ENCOUNTER — Encounter: Payer: Self-pay | Admitting: Family Medicine

## 2023-02-16 ENCOUNTER — Encounter: Payer: Self-pay | Admitting: Internal Medicine

## 2023-02-16 ENCOUNTER — Ambulatory Visit (INDEPENDENT_AMBULATORY_CARE_PROVIDER_SITE_OTHER): Payer: PPO | Admitting: Internal Medicine

## 2023-02-16 VITALS — BP 108/68 | HR 75 | Temp 98.4°F | Ht 64.0 in | Wt 149.2 lb

## 2023-02-16 DIAGNOSIS — L0291 Cutaneous abscess, unspecified: Secondary | ICD-10-CM

## 2023-02-16 MED ORDER — SULFAMETHOXAZOLE-TRIMETHOPRIM 800-160 MG PO TABS
1.0000 | ORAL_TABLET | Freq: Two times a day (BID) | ORAL | 0 refills | Status: DC
Start: 2023-02-16 — End: 2023-02-21

## 2023-02-16 MED ORDER — FLUCONAZOLE 150 MG PO TABS: ORAL_TABLET | ORAL | 0 refills | Status: DC

## 2023-02-16 NOTE — Progress Notes (Signed)
[facility] [address] [place] [address] Dept: [phone] Dept Fax: [phone]  Acute Care Office Visit  Subjective:   [name] 10/14/1970 02/16/2023  Chief Complaint  Patient presents with   Cyst    Cyst on chest, has oder been there a couple years getting worst over the years     HPI: [name] is a 52 year old female who presents complaining of a possible cyst to the left mid chest that onset several years ago.  She states it most recently became red.  She states when she squeezes it she has gotten some purulent drainage out that has a foul odor.  No fever, rash.     The following portions of the patient's history were reviewed and updated as appropriate: past medical history, past surgical history, family history, social history, allergies, medications, and problem list.   Patient Active Problem List   Diagnosis Date Noted   Hypotension 01/02/2023   Transportation insecurity 12/21/2022   Dehydration 12/21/2022   Menopause 12/21/2022   Abscess 03/30/2022   Amenorrhea 10/29/2021   Abnormal findings on diagnostic imaging of liver and biliary tract 10/13/2021   Colon cancer screening 10/13/2021   Constipation 10/13/2021   Gallstones, common bile duct 10/13/2021   Gastroesophageal reflux disease 10/13/2021   History of hepatitis C 10/13/2021   Intestinal gas excretion 10/13/2021   Liver fibrosis 10/13/2021   Right upper quadrant pain 10/13/2021   Stress at home 06/21/2021   Need for influenza vaccination 06/21/2021   Periorbital hyperpigmentation 05/03/2021   Abnormal CBC 05/03/2021   Lower respiratory infection 02/04/2021   Hematuria 12/28/2020   Status post total replacement of left hip 10/12/2020   Type 2 diabetes mellitus with hyperglycemia, without long-term current use of insulin (HCC) 04/02/2020   Peripheral edema 04/02/2020   COVID-19 07/09/2019   Left hip pain 04/30/2019   Otitis externa of  left ear 04/30/2019   Cholesteatoma of left ear 04/30/2019   Dysfunction of left eustachian tube 04/30/2019   Left ear pain 04/30/2019   Sciatica 04/19/2019   Class 3 severe obesity due to excess calories with serious comorbidity and body mass index (BMI) of 45.0 to 49.9 in adult (HCC) 04/19/2019   Post-nasal drip 01/10/2019   Allergic cough 01/10/2019   Obstructive sleep apnea syndrome 12/11/2018   Acute frontal sinusitis 12/10/2018   Primary osteoarthritis of left hip 11/13/2018   Morbid obesity (HCC) 11/13/2018   Elevated cholesterol 11/12/2018   Essential hypertension 11/12/2018   Allergic rhinitis due to pollen 11/12/2018   Reactive airway disease 11/12/2018   Breast pain, right 07/17/2018   Refused influenza vaccine 07/17/2018   Chronic nonintractable headache 02/12/2018   Dysuria 02/12/2018   Localized edema 02/12/2018   Hospital discharge follow-up 01/23/2018   Bladder problem 01/05/2018   Depression 01/05/2018   Insomnia 01/05/2018   Hepatitis C infection 12/01/2015   RLS (restless legs syndrome) 10/23/2015   Elevated liver enzymes 10/22/2015   Recurrent major depression-severe (HCC) 04/17/2015   GAD (generalized anxiety disorder) 04/15/2015   Bipolar depression (HCC) 04/15/2015   Severe recurrent major depression w/psychotic features, mood-congruent (HCC) 04/14/2015   Anxiety disorder 04/14/2015   Major depressive disorder, recurrent severe without psychotic features (HCC) 04/14/2015   Suicidal ideations    Chronic LBP 01/02/2015   Adult hypothyroidism 06/25/2012   B12 deficiency 06/25/2012   Other fatigue 06/25/2012   Chronic arthralgias of knees and hips 06/25/2012   Leukocytosis 06/25/2012   Vitamin D deficiency 06/25/2012  Past Medical History:  Diagnosis Date   ADHD (attention deficit hyperactivity disorder)    Anemia    with pregnancy   Anxiety    Arthritis    Bipolar disorder (HCC)    Chronic back pain greater than 3 months duration    Depression     Diabetes mellitus without complication (HCC)    Diabetic nephropathy (HCC)    bilateral feet   Dysuria    has to have self in and out cath    Eroded bladder suspension mesh (HCC)    GERD (gastroesophageal reflux disease)    Hepatitis    C   History of COVID-19 11/2019   History of MRSA infection    after back surgery   History of sleep apnea    resolved after weight loss   Hypertension    Hypothyroidism    Leg pain, bilateral    Obesity    Obsessive-compulsive disorder    Pneumonia 12/24/2019   Rosacea    Sciatic nerve pain    Social anxiety disorder    Squamous cell carcinoma of back 2021   Past Surgical History:  Procedure Laterality Date   BACK SURGERY     total of 6 back surgeries   bladder mesh  2009   DILITATION & CURRETTAGE/HYSTROSCOPY WITH NOVASURE ABLATION N/A 08/19/2016   Procedure: DILATATION & CURETTAGE WITH NOVASURE ABLATION;  Surgeon: Ilda Mori, MD;  Location: [facility];  Service: Gynecology;  Laterality: N/A;   EUS N/A 09/09/2015   Procedure: UPPER ENDOSCOPIC ULTRASOUND (EUS) RADIAL;  Surgeon: Willis Modena, MD;  Location: [facility];  Service: Endoscopy;  Laterality: N/A;   EYE SURGERY     INCONTINENCE SURGERY     SPINAL CORD STIMULATOR INSERTION     SPINAL CORD STIMULATOR REMOVAL     SPINAL FUSION  2008   times two   TOTAL HIP ARTHROPLASTY Left 10/12/2020   Procedure: LEFT TOTAL HIP ARTHROPLASTY ANTERIOR APPROACH;  Surgeon: Tarry Kos, MD;  Location: [facility];  Service: Orthopedics;  Laterality: Left;   TUBAL LIGATION  2006   Family History  Problem Relation Age of Onset   Schizophrenia Father    Schizophrenia Cousin    Hyperthyroidism Mother    Hypothyroidism Maternal Grandmother    Outpatient Medications Prior to Visit  Medication Sig Dispense Refill   ALPRAZolam (XANAX) 0.5 MG tablet Take 0.5 mg by mouth 3 (three) times daily as needed for anxiety.     ARIPiprazole (ABILIFY) 5 MG tablet Take 1 tablet (5 mg total) by mouth daily. 30 tablet  2   atorvastatin (LIPITOR) 20 MG tablet TAKE 1 TABLET(20 MG) BY MOUTH DAILY 100 tablet 3   blood glucose meter kit and supplies KIT Dispense based on patient and insurance preference. Check glucose before breakfast. ICD: E11.65 1 each 0   diclofenac Sodium (VOLTAREN) 1 % GEL Apply 2 g topically daily as needed. 150 g 1   FINACEA 15 % FOAM Apply 1 application  topically 2 (two) times daily as needed (rosacea).     gabapentin (NEURONTIN) 300 MG capsule TAKE 1 CAPSULE(300 MG) BY MOUTH THREE TIMES DAILY 90 capsule 2   glucose blood test strip Use as instructed, check glucose before breakfast. ICD: E11.65 100 each 12   lamoTRIgine (LAMICTAL) 150 MG tablet Take 300 mg by mouth daily.     Lancets (FREESTYLE) lancets Use as instructed. E11.65 100 each 12   levothyroxine (SYNTHROID) 75 MCG tablet Take 1 tablet (75 mcg total) by mouth daily  before breakfast. TAKE 1 TABLET(75 MCG) BY MOUTH DAILY BEFORE BREAKFAST Strength: 75 mcg 90 tablet 2   metFORMIN (GLUCOPHAGE) 500 MG tablet Take 1 tablet (500 mg total) by mouth 2 (two) times daily with a meal. Needs appointment for further refills. 60 tablet 0   methylphenidate (RITALIN) 20 MG tablet Take 20 mg by mouth 2 (two) times daily.     pantoprazole (PROTONIX) 40 MG tablet Take 1 tablet (40 mg total) by mouth daily. 90 tablet 1   polyethylene glycol powder (GLYCOLAX/MIRALAX) 17 GM/SCOOP powder Take 17 g by mouth daily. 850 g 3   Semaglutide,0.25 or 0.5MG /DOS, (OZEMPIC, 0.25 OR 0.5 MG/DOSE,) 2 MG/3ML SOPN INJECT 0.25 MG SUBCUTANEOUSLY EVERY THURSDAY 3 mL 2   traZODone (DESYREL) 100 MG tablet Take 100 mg by mouth at bedtime.     aspirin-acetaminophen-caffeine (EXCEDRIN MIGRAINE) 250-250-65 MG tablet Take 1 tablet by mouth every 6 (six) hours as needed for migraine. (Patient not taking: Reported on 02/16/2023)     lisinopril (ZESTRIL) 10 MG tablet Take 1 tablet (10 mg total) by mouth daily. (Patient not taking: Reported on 01/11/2023) 90 tablet 1   No  facility-administered medications prior to visit.   Allergies  Allergen Reactions   Pregabalin Anaphylaxis   Bupropion Hcl Other (See Comments)   Celecoxib Nausea And Vomiting and Other (See Comments)    Stomach pain   Effexor [Venlafaxine Hcl] Other (See Comments)    Serotonin toxicity   Oxybutynin Other (See Comments)    Urinary retention   Pramipexole Other (See Comments)    Mirapex- Worsening "body jerks"   Pramipexole Dihydrochloride Other (See Comments)   Pregabalin Other (See Comments)   Prozac [Fluoxetine Hcl] Swelling   Symbyax [Olanzapine-Fluoxetine Hcl] Swelling   Venlafaxine Other (See Comments)   Desvenlafaxine Other (See Comments) and Rash    Serotonin toxicity     ROS: A complete ROS was performed with pertinent positives/negatives noted in the HPI. The remainder of the ROS are negative.    Objective:   Today's Vitals   02/16/23 1333  BP: 108/68  Pulse: 75  Temp: 98.4 F (36.9 C)  TempSrc: Temporal  SpO2: 99%  Weight: 149 lb 3.2 oz (67.7 kg)  Height: 5\' 4"  (1.626 m)    GENERAL: Well-appearing, in NAD. Well nourished.  SKIN: Pink, warm and dry.  1 cm mildly erythematous movable nontender, nonfluctuant cystlike structure to left mid chest without active drainage RESPIRATORY: Chest wall symmetrical. Respirations even and non-labored.  EXTREMITIES: Without clubbing, cyanosis, or edema.  NEUROLOGIC: No motor or sensory deficits. Steady, even gait.  PSYCH/MENTAL STATUS: Alert, oriented x 3. Cooperative, appropriate mood and affect.    No results found for any visits on 02/16/23.    Assessment & Plan:  1. Abscess - sulfamethoxazole-trimethoprim (BACTRIM DS) 800-160 MG tablet; Take 1 tablet by mouth 2 (two) times daily for 7 days.  Dispense: 14 tablet; Refill: 0 -Warm compresses to area  -Area is not applicable for I&D at this time  Meds ordered this encounter  Medications   sulfamethoxazole-trimethoprim (BACTRIM DS) 800-160 MG tablet    Sig: Take 1  tablet by mouth 2 (two) times daily for 7 days.    Dispense:  14 tablet    Refill:  0    Order Specific Question:   Supervising Provider    Answer:   Garnette Gunner [[id number]]   fluconazole (DIFLUCAN) 150 MG tablet    Sig: Take 1 tablet by mouth once. Repeat dose in 3 days if  symptoms persist.    Dispense:  2 tablet    Refill:  0    Order Specific Question:   Supervising Provider    Answer:   Garnette Gunner [[id number]]   Lab Orders  No laboratory test(s) ordered today   No images are attached to the encounter or orders placed in the encounter.  Return if symptoms worsen or fail to improve.   Salvatore Decent, FNP

## 2023-02-17 ENCOUNTER — Ambulatory Visit: Payer: PPO | Admitting: Family Medicine

## 2023-02-20 ENCOUNTER — Ambulatory Visit: Payer: Self-pay

## 2023-02-20 DIAGNOSIS — E1165 Type 2 diabetes mellitus with hyperglycemia: Secondary | ICD-10-CM

## 2023-02-20 NOTE — Patient Outreach (Signed)
  Care Coordination   Follow Up Visit Note   02/20/2023 Name: [name] MRN: [id number] DOB: [birth year]  [name] is a 52 y.o. year old female who sees Mliss Sax, MD for primary care. I spoke with  Paw L Derk by phone today.  What matters to the patients health and wellness today?  Diabetes management     Goals Addressed             This Visit's Progress    Diabetes Management       Patient Goals/Self Care Activities: -Patient/Caregiver will self-administer medications as prescribed as evidenced by self-report/primary caregiver report  -Patient/Caregiver will attend all scheduled provider appointments as evidenced by clinician review of documented attendance to scheduled appointments and patient/caregiver report -Patient/Caregiver will call provider office for new concerns or questions as evidenced by review of documented incoming telephone call notes and patient report  -check blood sugar at prescribed times -check blood sugar if I feel it is too high or too low -record values and write them down take them to all doctor visits    Patient doing okay. No longer having transportation problems as her daughter has a car now.  Patient expressed waiting more food resources.  Referral completed.  Blood sugar 108 today.  Diabetes management continues.           SDOH assessments and interventions completed:    SDOH Interventions Today    Flowsheet Row Most Recent Value  SDOH Interventions   Food Insecurity Interventions AMB Referral        Care Coordination Interventions:     Follow up plan: Follow up call scheduled for August    Encounter Outcome:  Pt. Visit Completed    Bary Leriche, RN, MSN [facility] Care Management Care Management Coordinator Direct Line [phone]

## 2023-02-20 NOTE — Patient Instructions (Signed)
Visit Information  Thank you for taking time to visit with me today. Please don't hesitate to contact me if I can be of assistance to you.   Following are the goals we discussed today:   Goals Addressed             This Visit's Progress    Diabetes Management       Patient Goals/Self Care Activities: -Patient/Caregiver will self-administer medications as prescribed as evidenced by self-report/primary caregiver report  -Patient/Caregiver will attend all scheduled provider appointments as evidenced by clinician review of documented attendance to scheduled appointments and patient/caregiver report -Patient/Caregiver will call provider office for new concerns or questions as evidenced by review of documented incoming telephone call notes and patient report  -check blood sugar at prescribed times -check blood sugar if I feel it is too high or too low -record values and write them down take them to all doctor visits    Patient doing okay. No longer having transportation problems as her daughter has a car now.  Patient expressed waiting more food resources.  Referral completed.  Blood sugar 108 today.  Diabetes management continues.           Our next appointment is by telephone on 04/12/23 at 1200 pm  Please call the care guide team at [phone] if you need to cancel or reschedule your appointment.   If you are experiencing a Mental Health or Behavioral Health Crisis or need someone to talk to, please call the Suicide and Crisis Lifeline: 988   Patient verbalizes understanding of instructions and care plan provided today and agrees to view in MyChart. Active MyChart status and patient understanding of how to access instructions and care plan via MyChart confirmed with patient.     The patient has been provided with contact information for the care management team and has been advised to call with any health related questions or concerns.   Bary Leriche, RN, MSN [facility] Care  Management Care Management Coordinator Direct Line [phone]

## 2023-02-21 ENCOUNTER — Other Ambulatory Visit: Payer: Self-pay

## 2023-02-21 ENCOUNTER — Telehealth: Payer: Self-pay | Admitting: *Deleted

## 2023-02-21 ENCOUNTER — Encounter (HOSPITAL_BASED_OUTPATIENT_CLINIC_OR_DEPARTMENT_OTHER): Payer: Self-pay | Admitting: Emergency Medicine

## 2023-02-21 ENCOUNTER — Emergency Department (HOSPITAL_BASED_OUTPATIENT_CLINIC_OR_DEPARTMENT_OTHER)
Admission: EM | Admit: 2023-02-21 | Discharge: 2023-02-21 | Disposition: A | Discharge status: Home / Self Care | Payer: PPO | Attending: Emergency Medicine | Admitting: Emergency Medicine

## 2023-02-21 DIAGNOSIS — L02213 Cutaneous abscess of chest wall: Secondary | ICD-10-CM | POA: Diagnosis not present

## 2023-02-21 DIAGNOSIS — L0291 Cutaneous abscess, unspecified: Secondary | ICD-10-CM

## 2023-02-21 MED ORDER — LIDOCAINE-EPINEPHRINE (PF) 2 %-1:200000 IJ SOLN
10.0000 mL | Freq: Once | INTRAMUSCULAR | Status: AC
  Administered 2023-02-21: 10 mL via INTRADERMAL
  Filled 2023-02-21: qty 20

## 2023-02-21 MED ORDER — DOXYCYCLINE HYCLATE 100 MG PO CAPS: 100.0000 mg | ORAL_CAPSULE | Freq: Two times a day (BID) | ORAL | 0 refills | Status: DC

## 2023-02-21 MED ORDER — DOXYCYCLINE HYCLATE 100 MG PO TABS
100.0000 mg | ORAL_TABLET | Freq: Once | ORAL | Status: AC
  Administered 2023-02-21: 100 mg via ORAL
  Filled 2023-02-21: qty 1

## 2023-02-21 NOTE — ED Triage Notes (Signed)
Pt POV c/o abscess to left chest since last Wednesday, prescribed antibiotics and taken as prescribed.  No improvement, abscess worsening.

## 2023-02-21 NOTE — Telephone Encounter (Signed)
   Telephone encounter was:  Successful.  02/21/2023 Name: [name] MRN: [id number] DOB: August 01, 1971  [name] is a 52 y.o. year old female who is a primary care patient of Mliss Sax, MD . The community resource team was consulted for assistance with Food Insecurity  Care guide performed the following interventions: Patient provided with information about care guide support team and interviewed to confirm resource needs. Patient mailed food banks in [place] and [place] referral  Follow Up Plan:  No further follow up requested  Yehuda Mao Greenauer -[facility] [place], Population Health [phone] [address] [place] , [place] [address] Email : [email or web address]

## 2023-02-21 NOTE — Discharge Instructions (Signed)
[name], today we drained the abscess. We are switching your antibiotics to doxycyline, you will take two a day for a total of 5 days. Please do NOT take the bactrim (the other antibiotic you got). Please also make an appointment with your primary care doctor for a follow up as well sometime within the next week. If you for any fevers, chills, shortness of breath please come back.

## 2023-02-21 NOTE — ED Provider Notes (Signed)
[place] EMERGENCY DEPARTMENT AT [facility] [place] Provider Note   CSN: [id number] Arrival date & time: 02/21/23  1942     History  Chief Complaint  Patient presents with   Abscess     [name] is a 52 year old female with past medical history of diabetes who presents with worsening chest abscess.  She states she has had this abscess for about 2 years, however recently it has been getting bigger and has started to drain.  She was seen by her primary care physician recently, and was given a course of Bactrim which she states is not helping.  Between that visit and also this visit she states that there is new erythema around the area as well as increased pain, and yellow drainage.  She denies any fevers, chills, nausea, vomiting, or shortness of breath.  Of note, she does have a history of squamous cell carcinoma on her back for which she follows with dermatology for, however unable to see notes.   Abscess      Home Medications Prior to Admission medications   Medication Sig Start Date End Date Taking? Authorizing Provider  doxycycline (VIBRAMYCIN) 100 MG capsule Take 1 capsule (100 mg total) by mouth 2 (two) times daily. 02/21/23  Yes Tasha Jindra, Jason Fila, MD  ALPRAZolam Prudy Feeler) 0.5 MG tablet Take 0.5 mg by mouth 3 (three) times daily as needed for anxiety. 12/20/19   [provider]  ARIPiprazole (ABILIFY) 5 MG tablet Take 1 tablet (5 mg total) by mouth daily. 09/17/15   Benjaman Pott, MD  aspirin-acetaminophen-caffeine (EXCEDRIN MIGRAINE) [phone] MG tablet Take 1 tablet by mouth every 6 (six) hours as needed for migraine. Patient not taking: Reported on 02/16/2023    [provider]  atorvastatin (LIPITOR) 20 MG tablet TAKE 1 TABLET(20 MG) BY MOUTH DAILY 10/02/22   Mliss Sax, MD  blood glucose meter kit and supplies KIT Dispense based on patient and insurance preference. Check glucose before breakfast. ICD: E11.65 04/02/20   Nche, Bonna Gains, NP  diclofenac Sodium (VOLTAREN) 1 % GEL Apply 2 g topically daily as needed. 06/20/22   Mliss Sax, MD  FINACEA 15 % FOAM Apply 1 application  topically 2 (two) times daily as needed (rosacea). 02/14/20   [provider]  fluconazole (DIFLUCAN) 150 MG tablet Take 1 tablet by mouth once. Repeat dose in 3 days if symptoms persist. 02/16/23   Salvatore Decent, FNP  gabapentin (NEURONTIN) 300 MG capsule TAKE 1 CAPSULE(300 MG) BY MOUTH THREE TIMES DAILY 11/25/22   Mliss Sax, MD  glucose blood test strip Use as instructed, check glucose before breakfast. ICD: E11.65 07/21/20   Mliss Sax, MD  lamoTRIgine (LAMICTAL) 150 MG tablet Take 300 mg by mouth daily. 02/22/20   [provider]  Lancets (FREESTYLE) lancets Use as instructed. E11.65 07/21/20   Mliss Sax, MD  levothyroxine (SYNTHROID) 75 MCG tablet Take 1 tablet (75 mcg total) by mouth daily before breakfast. TAKE 1 TABLET(75 MCG) BY MOUTH DAILY BEFORE BREAKFAST Strength: 75 mcg 01/25/23   Mliss Sax, MD  lisinopril (ZESTRIL) 10 MG tablet Take 1 tablet (10 mg total) by mouth daily. Patient not taking: Reported on 01/11/2023 12/07/22   Eulis Foster, FNP  metFORMIN (GLUCOPHAGE) 500 MG tablet Take 1 tablet (500 mg total) by mouth 2 (two) times daily with a meal. Needs appointment for further refills. 10/27/22   Mliss Sax, MD  methylphenidate (RITALIN) 20 MG tablet Take 20 mg  by mouth 2 (two) times daily. 09/22/20   [provider]  pantoprazole (PROTONIX) 40 MG tablet Take 1 tablet (40 mg total) by mouth daily. 12/21/22   Mliss Sax, MD  polyethylene glycol powder [facility]) 17 GM/SCOOP powder Take 17 g by mouth daily. 11/30/22   Nche, Bonna Gains, NP  Semaglutide,0.25 or 0.5MG /DOS, (OZEMPIC, 0.25 OR 0.5 MG/DOSE,) 2 MG/3ML SOPN INJECT 0.25 MG SUBCUTANEOUSLY EVERY THURSDAY 11/14/22   Mliss Sax, MD  traZODone (DESYREL) 100 MG  tablet Take 100 mg by mouth at bedtime.    [provider]      Allergies    Pregabalin, Bupropion hcl, Celecoxib, Effexor [venlafaxine hcl], Oxybutynin, Pramipexole, Pramipexole dihydrochloride, Pregabalin, Prozac [fluoxetine hcl], Symbyax [olanzapine-fluoxetine hcl], Venlafaxine, and Desvenlafaxine    Review of Systems   Review of Systems  Physical Exam Updated Vital Signs BP 105/67   Pulse 68   Temp 98 F (36.7 C)   Resp 15   Ht 5\' 4"  (1.626 m)   Wt 64.9 kg   LMP 01/05/2023 (Approximate)   SpO2 99%   BMI 24.55 kg/m  Physical Exam Constitutional:      Appearance: Normal appearance.  Cardiovascular:     Rate and Rhythm: Normal rate and regular rhythm.  Pulmonary:     Effort: Pulmonary effort is normal.     Breath sounds: Normal breath sounds.  Skin:    Comments: Left chest wall, above left breast, 1 cm x 1 cm, discolored mobile abscess.  Erythema extending up to 2 cm as well as around.  Tender to palpation.  Neurological:     Mental Status: She is alert.     ED Results / Procedures / Treatments   Labs (all labs ordered are listed, but only abnormal results are displayed) Labs Reviewed - No data to display  EKG None  Radiology No results found.  Procedures .Marland KitchenIncision and Drainage  Date/Time: 02/21/2023 9:10 PM  Performed by: Olegario Messier, MD Authorized by: Heide Scales, MD   Consent:    Consent obtained:  Verbal   Consent given by:  Patient   Risks, benefits, and alternatives were discussed: yes     Risks discussed:  Bleeding, pain and infection   Alternatives discussed:  Alternative treatment Universal protocol:    Procedure explained and questions answered to patient or proxy's satisfaction: yes     Patient identity confirmed:  Verbally with patient Location:    Type:  Abscess   Size:  1cmx1cm   Location:  Trunk   Trunk location:  Chest Pre-procedure details:    Skin preparation:  Povidone-iodine Sedation:    Sedation  type:  None Anesthesia:    Anesthesia method:  Topical application Procedure type:    Complexity:  Simple Procedure details:    Ultrasound guidance: yes     Needle aspiration: no     Incision types:  Stab incision   Incision depth:  Dermal   Wound management:  Irrigated with saline and probed and deloculated   Drainage:  Purulent   Drainage amount:  Moderate   Packing materials:  None Post-procedure details:    Procedure completion:  Tolerated     Medications Ordered in ED Medications  lidocaine-EPINEPHrine (XYLOCAINE W/EPI) 2 %-1:200000 (PF) injection 10 mL (10 mLs Intradermal Given 02/21/23 2110)  doxycycline (VIBRA-TABS) tablet 100 mg (100 mg Oral Given 02/21/23 2110)    ED Course/ Medical Decision Making/ A&P  Medical Decision Making Overall, 52 year old female with 2-year history of chest abscess, which has gotten worse within the last week or so in terms of pain and drainage.  She has failed oral antibiotic therapy for this abscess.  Reassuring that she is not having any systemic symptoms such as fevers, chills.  On my exam, the abscess is discolored, mobile, and seems as if there may be more abscesses present underneath the skin.  Considering she had been on Bactrim therapy with no improvement in symptoms, I&D was considered.  Ultrasound of the area showed a 1.8 cm x 0.8 cm fluid space, being enough to drain.  Incision and drainage was performed (please see procedure section above).  Purulent drainage was done moderate amount, and covered with bacitracin and gauze.  Patient tolerated well.  Will switch antibiotics from Bactrim to doxycycline considering Bactrim was not working.  Will give a dose here, and then patient will complete a 5-day course 100 mg twice a day.  Risk Prescription drug management.     Final Clinical Impression(s) / ED Diagnoses Final diagnoses:  Abscess    Rx / DC Orders ED Discharge Orders          Ordered     doxycycline (VIBRAMYCIN) 100 MG capsule  2 times daily        02/21/23 2101              Kina Shiffman, Jason Fila, MD 02/21/23 2114    Tegeler, Canary Brim, MD 02/22/23 [phone]

## 2023-02-22 ENCOUNTER — Telehealth: Payer: Self-pay

## 2023-02-22 NOTE — Transitions of Care (Post Inpatient/ED Visit) (Signed)
02/22/2023  Name: [name] MRN: [id number] DOB: [birth year]  Today's TOC FU Call Status: Today's TOC FU Call Status:: Successful TOC FU Call Competed TOC FU Call Complete Date: 02/22/23  Transition Care Management Follow-up Telephone Call Date of Discharge: 02/21/23 Discharge Facility: [facility] ([facility]) Type of Discharge: Emergency Department Reason for ED Visit: Other: How have you been since you were released from the hospital?: Same Any questions or concerns?: No  Items Reviewed: Did you receive and understand the discharge instructions provided?: Yes Medications obtained,verified, and reconciled?: Yes (Medications Reviewed) Any new allergies since your discharge?: No Dietary orders reviewed?: NA Do you have support at home?: Yes  Medications Reviewed Today: Medications Reviewed Today     Reviewed by Salvatore Decent, FNP (Family Nurse Practitioner) on 02/16/23 at 1347  Med List Status: <None>   Medication Order Taking? Sig Documenting Provider Last Dose Status Informant  ALPRAZolam (XANAX) 0.5 MG tablet [id number] Yes Take 0.5 mg by mouth 3 (three) times daily as needed for anxiety. [provider] Taking Active Self, Pharmacy Records           Med Note Lauretta Chester, ALEXANDRE A   Thu Dec 09, 2021 11:58 AM)    ARIPiprazole (ABILIFY) 5 MG tablet [id number] Yes Take 1 tablet (5 mg total) by mouth daily. Benjaman Pott, MD Taking Active Self, Pharmacy Records  aspirin-acetaminophen-caffeine [facility] MIGRAINE) [phone] MG tablet [id number] No Take 1 tablet by mouth every 6 (six) hours as needed for migraine.  Patient not taking: Reported on 02/16/2023   [provider] Not Taking Active Self, Pharmacy Records  atorvastatin (LIPITOR) 20 MG tablet [id number] Yes TAKE 1 TABLET(20 MG) BY MOUTH DAILY Mliss Sax, MD Taking Active   blood glucose meter kit and supplies KIT [id number] Yes Dispense based on patient and insurance preference. Check glucose  before breakfast. ICD: E11.65 Nche, Bonna Gains, NP Taking Active Self, Pharmacy Records  diclofenac Sodium (VOLTAREN) 1 % GEL [id number] Yes Apply 2 g topically daily as needed. Mliss Sax, MD Taking Active   FINACEA 15 % Erlene Quan [id number] Yes Apply 1 application  topically 2 (two) times daily as needed (rosacea). [provider] Taking Active Self, Pharmacy Records           Med Note Jenne Pane, TEQUILA   Wed Apr 08, 2020 11:05 AM) PRN  gabapentin (NEURONTIN) 300 MG capsule [id number] Yes TAKE 1 CAPSULE(300 MG) BY MOUTH THREE TIMES DAILY Mliss Sax, MD Taking Active   glucose blood test strip [id number] Yes Use as instructed, check glucose before breakfast. ICD: E11.65 Mliss Sax, MD Taking Active Self, Pharmacy Records  lamoTRIgine (LAMICTAL) 150 MG tablet [id number] Yes Take 300 mg by mouth daily. [provider] Taking Active Self, Pharmacy Records           Med Note Lauretta Chester, Coy Saunas A   Thu Dec 09, 2021 12:08 PM) Lamotrigine 150 mg 2 tablets daily  Lancets (FREESTYLE) lancets [id number] Yes Use as instructed. E11.65 Mliss Sax, MD Taking Active Self, Pharmacy Records  levothyroxine (SYNTHROID) 75 MCG tablet [id number] Yes Take 1 tablet (75 mcg total) by mouth daily before breakfast. TAKE 1 TABLET(75 MCG) BY MOUTH DAILY BEFORE BREAKFAST Strength: 75 mcg Mliss Sax, MD Taking Active   lisinopril (ZESTRIL) 10 MG tablet [id number] No Take 1 tablet (10 mg total) by mouth daily.  Patient not taking: Reported on 01/11/2023   Eulis Foster, FNP Not Taking Active   metFORMIN (GLUCOPHAGE) 500 MG tablet [id number] Yes  Take 1 tablet (500 mg total) by mouth 2 (two) times daily with a meal. Needs appointment for further refills. Mliss Sax, MD Taking Active   methylphenidate (RITALIN) 20 MG tablet [id number] Yes Take 20 mg by mouth 2 (two) times daily. [provider] Taking Active Self, Pharmacy Records           Med  Note Lauretta Chester, ALEXANDRE A   Thu Dec 09, 2021 11:56 AM) Takes PRN  pantoprazole (PROTONIX) 40 MG tablet [id number] Yes Take 1 tablet (40 mg total) by mouth daily. Mliss Sax, MD Taking Active   polyethylene glycol powder [facility]) 17 GM/SCOOP powder [id number] Yes Take 17 g by mouth daily. Nche, Bonna Gains, NP Taking Active   Semaglutide,0.25 or 0.5MG /DOS, (OZEMPIC, 0.25 OR 0.5 MG/DOSE,) 2 MG/3ML SOPN [id number] Yes INJECT 0.25 MG SUBCUTANEOUSLY EVERY THURSDAY Mliss Sax, MD Taking Active   traZODone (DESYREL) 100 MG tablet [id number] Yes Take 100 mg by mouth at bedtime. [provider] Taking Active             Home Care and Equipment/Supplies: Were Home Health Services Ordered?: NA Any new equipment or medical supplies ordered?: NA  Functional Questionnaire: Do you need assistance with bathing/showering or dressing?: No Do you need assistance with meal preparation?: No Do you need assistance with eating?: No Do you have difficulty maintaining continence: No Do you need assistance with getting out of bed/getting out of a chair/moving?: No Do you have difficulty managing or taking your medications?: No  Follow up appointments reviewed: PCP Follow-up appointment confirmed?: No (Patient stated she would call back to schedule) MD Provider Line Number:[phone] Given: Yes Specialist Hospital Follow-up appointment confirmed?: NA Do you need transportation to your follow-up appointment?: No Do you understand care options if your condition(s) worsen?: Yes-patient verbalized understanding    SIGNATURE Helix Lafontaine D, CMA

## 2023-02-23 ENCOUNTER — Encounter: Payer: Self-pay | Admitting: Family Medicine

## 2023-02-23 ENCOUNTER — Ambulatory Visit (INDEPENDENT_AMBULATORY_CARE_PROVIDER_SITE_OTHER): Payer: PPO | Admitting: Family Medicine

## 2023-02-23 VITALS — BP 100/62 | HR 71 | Temp 98.2°F | Ht 64.0 in | Wt 144.2 lb

## 2023-02-23 DIAGNOSIS — E1165 Type 2 diabetes mellitus with hyperglycemia: Secondary | ICD-10-CM

## 2023-02-23 DIAGNOSIS — Z7984 Long term (current) use of oral hypoglycemic drugs: Secondary | ICD-10-CM | POA: Diagnosis not present

## 2023-02-23 DIAGNOSIS — E039 Hypothyroidism, unspecified: Secondary | ICD-10-CM

## 2023-02-23 DIAGNOSIS — E7800 Pure hypercholesterolemia, unspecified: Secondary | ICD-10-CM

## 2023-02-23 DIAGNOSIS — L720 Epidermal cyst: Secondary | ICD-10-CM

## 2023-02-23 MED ORDER — LEVOTHYROXINE SODIUM 75 MCG PO TABS: 75.0000 ug | ORAL_TABLET | Freq: Every day | ORAL | 2 refills | Status: DC

## 2023-02-23 MED ORDER — ATORVASTATIN CALCIUM 20 MG PO TABS
ORAL_TABLET | ORAL | 3 refills | Status: DC
Start: 2023-02-23 — End: 2023-07-13

## 2023-02-23 MED ORDER — METFORMIN HCL 500 MG PO TABS: 500.0000 mg | ORAL_TABLET | Freq: Two times a day (BID) | ORAL | 3 refills | Status: DC

## 2023-02-23 NOTE — Progress Notes (Signed)
Established Patient Office Visit   Subjective:  Patient ID: [name], female    DOB: Nov 18, 1970  Age: 52 y.o. MRN: [id number]  Chief Complaint  Patient presents with   Hospitalization Follow-up    Hospital follow up seen for abscess on chest still little painful with some drainage.     HPI Encounter Diagnoses  Name Primary?   Inclusion cyst Yes   Elevated cholesterol    Type 2 diabetes mellitus with hyperglycemia, without long-term current use of insulin (HCC)    Adult hypothyroidism    For follow-up Recent I&D 2 days ago.  Wound has essentially sealed but is still draining.  She will be needing refills of metformin, levothyroxine and atorvastatin.  Blood work recently checked a few months ago.   Review of Systems  Constitutional: Negative.   HENT: Negative.    Eyes:  Negative for blurred vision, discharge and redness.  Respiratory: Negative.    Cardiovascular: Negative.   Gastrointestinal:  Negative for abdominal pain.  Genitourinary: Negative.   Musculoskeletal: Negative.  Negative for myalgias.  Skin:  Negative for rash.  Neurological:  Negative for tingling, loss of consciousness and weakness.  Endo/Heme/Allergies:  Negative for polydipsia.     Current Outpatient Medications:    ALPRAZolam (XANAX) 0.5 MG tablet, Take 0.5 mg by mouth 3 (three) times daily as needed for anxiety., Disp: , Rfl:    ARIPiprazole (ABILIFY) 5 MG tablet, Take 1 tablet (5 mg total) by mouth daily., Disp: 30 tablet, Rfl: 2   aspirin-acetaminophen-caffeine (EXCEDRIN MIGRAINE) 250-250-65 MG tablet, Take 1 tablet by mouth every 6 (six) hours as needed for migraine., Disp: , Rfl:    blood glucose meter kit and supplies KIT, Dispense based on patient and insurance preference. Check glucose before breakfast. ICD: E11.65, Disp: 1 each, Rfl: 0   diclofenac Sodium (VOLTAREN) 1 % GEL, Apply 2 g topically daily as needed., Disp: 150 g, Rfl: 1   doxycycline (VIBRAMYCIN) 100 MG capsule, Take 1 capsule  (100 mg total) by mouth 2 (two) times daily., Disp: 10 capsule, Rfl: 0   FINACEA 15 % FOAM, Apply 1 application  topically 2 (two) times daily as needed (rosacea)., Disp: , Rfl:    gabapentin (NEURONTIN) 300 MG capsule, TAKE 1 CAPSULE(300 MG) BY MOUTH THREE TIMES DAILY, Disp: 90 capsule, Rfl: 2   glucose blood test strip, Use as instructed, check glucose before breakfast. ICD: E11.65, Disp: 100 each, Rfl: 12   lamoTRIgine (LAMICTAL) 150 MG tablet, Take 300 mg by mouth daily., Disp: , Rfl:    Lancets (FREESTYLE) lancets, Use as instructed. E11.65, Disp: 100 each, Rfl: 12   methylphenidate (RITALIN) 20 MG tablet, Take 20 mg by mouth 2 (two) times daily., Disp: , Rfl:    pantoprazole (PROTONIX) 40 MG tablet, Take 1 tablet (40 mg total) by mouth daily., Disp: 90 tablet, Rfl: 1   polyethylene glycol powder (GLYCOLAX/MIRALAX) 17 GM/SCOOP powder, Take 17 g by mouth daily., Disp: 850 g, Rfl: 3   Semaglutide,0.25 or 0.5MG /DOS, (OZEMPIC, 0.25 OR 0.5 MG/DOSE,) 2 MG/3ML SOPN, INJECT 0.25 MG SUBCUTANEOUSLY EVERY THURSDAY, Disp: 3 mL, Rfl: 2   traZODone (DESYREL) 100 MG tablet, Take 100 mg by mouth at bedtime., Disp: , Rfl:    atorvastatin (LIPITOR) 20 MG tablet, TAKE 1 TABLET(20 MG) BY MOUTH DAILY, Disp: 100 tablet, Rfl: 3   fluconazole (DIFLUCAN) 150 MG tablet, Take 1 tablet by mouth once. Repeat dose in 3 days if symptoms persist. (Patient not taking: Reported on 02/23/2023),  Disp: 2 tablet, Rfl: 0   levothyroxine (SYNTHROID) 75 MCG tablet, Take 1 tablet (75 mcg total) by mouth daily before breakfast. TAKE 1 TABLET(75 MCG) BY MOUTH DAILY BEFORE BREAKFAST Strength: 75 mcg, Disp: 90 tablet, Rfl: 2   metFORMIN (GLUCOPHAGE) 500 MG tablet, Take 1 tablet (500 mg total) by mouth 2 (two) times daily with a meal., Disp: 180 tablet, Rfl: 3   Objective:     BP 100/62 (BP Location: Left Arm, Patient Position: Sitting, Cuff Size: Normal)   Pulse 71   Temp 98.2 F (36.8 C) (Temporal)   Ht 5\' 4"  (1.626 m)   Wt 144 lb  3.2 oz (65.4 kg)   LMP 01/05/2023 (Approximate)   SpO2 97%   BMI 24.75 kg/m    Physical Exam Constitutional:      General: She is not in acute distress.    Appearance: Normal appearance. She is not ill-appearing, toxic-appearing or diaphoretic.  HENT:     Head: Normocephalic and atraumatic.     Right Ear: External ear normal.     Left Ear: External ear normal.  Eyes:     General: No scleral icterus.       Right eye: No discharge.        Left eye: No discharge.     Extraocular Movements: Extraocular movements intact.     Conjunctiva/sclera: Conjunctivae normal.  Pulmonary:     Effort: Pulmonary effort is normal. No respiratory distress.  Skin:    General: Skin is warm and dry.       Neurological:     Mental Status: She is alert and oriented to person, place, and time.  Psychiatric:        Mood and Affect: Mood normal.        Behavior: Behavior normal.    Incision and Drainage Procedure Note  Pre-operative Diagnosis: inclusion cyst  Post-operative Diagnosis: normal  Indications: soreness and drainage  Anesthesia: 7 cc 2% plain lidocaine  Procedure Details  The procedure, risks and complications have been discussed in detail (including, but not limited to airway compromise, infection, bleeding) with the patient, and the patient has signed consent to the procedure.  The skin was sterilely prepped and draped over the affected area in the usual fashion. After adequate local anesthesia, I&D with a #11 and 15 blade was performed on the right upper chest. Minimal purulent drainage: present The patient was observed until stable.  Findings: same  EBL: 0 cc's  Drains:24" plain tape  Condition: Tolerated procedure well   Complications: none.    No results found for any visits on 02/23/23.    The 10-year ASCVD risk score (Arnett DK, et al., 2019) is: 3.9%    Assessment & Plan:   Inclusion cyst -     WOUND CULTURE  Elevated cholesterol -      Atorvastatin Calcium; TAKE 1 TABLET(20 MG) BY MOUTH DAILY  Dispense: 100 tablet; Refill: 3  Type 2 diabetes mellitus with hyperglycemia, without long-term current use of insulin (HCC) -     metFORMIN HCl; Take 1 tablet (500 mg total) by mouth 2 (two) times daily with a meal.  Dispense: 180 tablet; Refill: 3  Adult hypothyroidism -     Levothyroxine Sodium; Take 1 tablet (75 mcg total) by mouth daily before breakfast. TAKE 1 TABLET(75 MCG) BY MOUTH DAILY BEFORE BREAKFAST Strength: 75 mcg  Dispense: 90 tablet; Refill: 2    Return in about 4 days (around 02/27/2023), or Return on Monday for repacking.  Please proceed to emergency room with any issues or concerns.Mliss Sax, MD

## 2023-02-24 ENCOUNTER — Telehealth: Payer: Self-pay | Admitting: Family Medicine

## 2023-02-24 DIAGNOSIS — E039 Hypothyroidism, unspecified: Secondary | ICD-10-CM

## 2023-02-24 MED ORDER — LEVOTHYROXINE SODIUM 75 MCG PO TABS
75.0000 ug | ORAL_TABLET | Freq: Every day | ORAL | 2 refills | Status: DC
Start: 2023-02-24 — End: 2023-07-13

## 2023-02-24 NOTE — Telephone Encounter (Signed)
Prescription Request  02/24/2023  LOV: 02/23/2023  What is the name of the medication or equipment? levothyroxine (SYNTHROID) 75 MCG tablet [[id number]]   Have you contacted your pharmacy to request a refill? Yes, she was seen on 02/23/23 by [place] and she is needing this refilled before the weekend  Which pharmacy would you like this sent to?  [facility] - [place], [place] - [address] [facility] [address] [place] [address] Phone: [phone] Fax: [phone]   Please advise at Home [phone]   **Pt is also wanting a cb concerning her wound culture from yesterday 02/23/23**

## 2023-02-26 LAB — WOUND CULTURE: MICRO NUMBER:: 15107220

## 2023-02-27 ENCOUNTER — Encounter: Payer: Self-pay | Admitting: Family Medicine

## 2023-02-27 ENCOUNTER — Ambulatory Visit (INDEPENDENT_AMBULATORY_CARE_PROVIDER_SITE_OTHER): Payer: PPO | Admitting: Family Medicine

## 2023-02-27 VITALS — BP 112/62 | HR 89 | Temp 98.4°F | Ht 64.0 in | Wt 142.8 lb

## 2023-02-27 DIAGNOSIS — L0291 Cutaneous abscess, unspecified: Secondary | ICD-10-CM

## 2023-02-27 LAB — WOUND CULTURE
RESULT:: NO GROWTH
SPECIMEN QUALITY:: ADEQUATE

## 2023-02-27 MED ORDER — TRAMADOL HCL 50 MG PO TABS
50.0000 mg | ORAL_TABLET | Freq: Two times a day (BID) | ORAL | 0 refills | Status: AC | PRN
Start: 2023-02-27 — End: 2023-03-04

## 2023-02-27 NOTE — Progress Notes (Signed)
Established Patient Office Visit   Subjective:  Patient ID: [name], female    DOB: [birth year]  Age: 52 y.o. MRN: [id number]  Chief Complaint  Patient presents with   Cyst    Follow up repacking of cyst. Pt complains of a lot of pain.     HPI Encounter Diagnoses  Name Primary?   Abscess Yes   For repacking.  Drainage has slowed.  Wound is tender.  Her grandson slept with her the other night and rolled over and smacked it.  That was quite painful.  She has been afebrile and otherwise doing okay.  Wound culture was negative.   Review of Systems  Constitutional: Negative.   HENT: Negative.    Eyes:  Negative for blurred vision, discharge and redness.  Respiratory: Negative.    Cardiovascular: Negative.   Gastrointestinal:  Negative for abdominal pain.  Genitourinary: Negative.   Musculoskeletal: Negative.  Negative for myalgias.  Skin:  Negative for rash.  Neurological:  Negative for tingling, loss of consciousness and weakness.  Endo/Heme/Allergies:  Negative for polydipsia.     Current Outpatient Medications:    traMADol (ULTRAM) 50 MG tablet, Take 1 tablet (50 mg total) by mouth every 12 (twelve) hours as needed for up to 5 days., Disp: 12 tablet, Rfl: 0   ALPRAZolam (XANAX) 0.5 MG tablet, Take 0.5 mg by mouth 3 (three) times daily as needed for anxiety., Disp: , Rfl:    ARIPiprazole (ABILIFY) 5 MG tablet, Take 1 tablet (5 mg total) by mouth daily., Disp: 30 tablet, Rfl: 2   aspirin-acetaminophen-caffeine (EXCEDRIN MIGRAINE) 250-250-65 MG tablet, Take 1 tablet by mouth every 6 (six) hours as needed for migraine., Disp: , Rfl:    atorvastatin (LIPITOR) 20 MG tablet, TAKE 1 TABLET(20 MG) BY MOUTH DAILY, Disp: 100 tablet, Rfl: 3   blood glucose meter kit and supplies KIT, Dispense based on patient and insurance preference. Check glucose before breakfast. ICD: E11.65, Disp: 1 each, Rfl: 0   diclofenac Sodium (VOLTAREN) 1 % GEL, Apply 2 g topically daily as needed., Disp:  150 g, Rfl: 1   doxycycline (VIBRAMYCIN) 100 MG capsule, Take 1 capsule (100 mg total) by mouth 2 (two) times daily., Disp: 10 capsule, Rfl: 0   FINACEA 15 % FOAM, Apply 1 application  topically 2 (two) times daily as needed (rosacea)., Disp: , Rfl:    fluconazole (DIFLUCAN) 150 MG tablet, Take 1 tablet by mouth once. Repeat dose in 3 days if symptoms persist. (Patient not taking: Reported on 02/23/2023), Disp: 2 tablet, Rfl: 0   gabapentin (NEURONTIN) 300 MG capsule, TAKE 1 CAPSULE(300 MG) BY MOUTH THREE TIMES DAILY, Disp: 90 capsule, Rfl: 2   glucose blood test strip, Use as instructed, check glucose before breakfast. ICD: E11.65, Disp: 100 each, Rfl: 12   lamoTRIgine (LAMICTAL) 150 MG tablet, Take 300 mg by mouth daily., Disp: , Rfl:    Lancets (FREESTYLE) lancets, Use as instructed. E11.65, Disp: 100 each, Rfl: 12   levothyroxine (SYNTHROID) 75 MCG tablet, Take 1 tablet (75 mcg total) by mouth daily before breakfast. TAKE 1 TABLET(75 MCG) BY MOUTH DAILY BEFORE BREAKFAST Strength: 75 mcg, Disp: 90 tablet, Rfl: 2   metFORMIN (GLUCOPHAGE) 500 MG tablet, Take 1 tablet (500 mg total) by mouth 2 (two) times daily with a meal., Disp: 180 tablet, Rfl: 3   methylphenidate (RITALIN) 20 MG tablet, Take 20 mg by mouth 2 (two) times daily., Disp: , Rfl:    pantoprazole (PROTONIX) 40 MG tablet,  Take 1 tablet (40 mg total) by mouth daily., Disp: 90 tablet, Rfl: 1   polyethylene glycol powder (GLYCOLAX/MIRALAX) 17 GM/SCOOP powder, Take 17 g by mouth daily., Disp: 850 g, Rfl: 3   Semaglutide,0.25 or 0.5MG /DOS, (OZEMPIC, 0.25 OR 0.5 MG/DOSE,) 2 MG/3ML SOPN, INJECT 0.25 MG SUBCUTANEOUSLY EVERY THURSDAY, Disp: 3 mL, Rfl: 2   traZODone (DESYREL) 100 MG tablet, Take 100 mg by mouth at bedtime., Disp: , Rfl:    Objective:     BP 112/62   Pulse 89   Temp 98.4 F (36.9 C)   Ht 5\' 4"  (1.626 m)   Wt 142 lb 12.8 oz (64.8 kg)   LMP 01/05/2023 (Approximate)   BMI 24.51 kg/m    Physical Exam Constitutional:       General: She is not in acute distress.    Appearance: Normal appearance. She is not ill-appearing, toxic-appearing or diaphoretic.  HENT:     Head: Normocephalic and atraumatic.     Right Ear: External ear normal.     Left Ear: External ear normal.  Eyes:     General: No scleral icterus.       Right eye: No discharge.        Left eye: No discharge.     Extraocular Movements: Extraocular movements intact.     Conjunctiva/sclera: Conjunctivae normal.  Pulmonary:     Effort: Pulmonary effort is normal. No respiratory distress.  Skin:    General: Skin is warm and dry.       Neurological:     Mental Status: She is alert and oriented to person, place, and time.  Psychiatric:        Mood and Affect: Mood normal.        Behavior: Behavior normal.      No results found for any visits on 02/27/23.    The 10-year ASCVD risk score (Arnett DK, et al., 2019) is: 4.8%    Assessment & Plan:   Abscess -     traMADol HCl; Take 1 tablet (50 mg total) by mouth every 12 (twelve) hours as needed for up to 5 days.  Dispense: 12 tablet; Refill: 0    Return in about 2 days (around 03/01/2023).  Return on Wednesday morning for packing removal.  Should continue to do well past that point.  Mliss Sax, MD

## 2023-03-01 ENCOUNTER — Ambulatory Visit (INDEPENDENT_AMBULATORY_CARE_PROVIDER_SITE_OTHER): Payer: PPO | Admitting: Internal Medicine

## 2023-03-01 ENCOUNTER — Encounter: Payer: Self-pay | Admitting: Internal Medicine

## 2023-03-01 VITALS — BP 112/68 | HR 64 | Temp 97.9°F | Ht 64.0 in | Wt 145.6 lb

## 2023-03-01 DIAGNOSIS — L0291 Cutaneous abscess, unspecified: Secondary | ICD-10-CM | POA: Diagnosis not present

## 2023-03-01 NOTE — Progress Notes (Signed)
[facility] [address] [place] [address] Dept: [phone] Dept Fax: [phone]  Acute Care Office Visit  Subjective:   [name] July 25, 1971 03/01/2023  Chief Complaint  Patient presents with   Hospitalization Follow-up    Remove packing from chest    HPI: Patient presents for follow up from I&D of abscess to chest. Packing to be removed today.  Denies fever.  Does report some active drainage coming from wound.  Has been keeping area covered with bandage.    The following portions of the patient's history were reviewed and updated as appropriate: past medical history, past surgical history, family history, social history, allergies, medications, and problem list.   Patient Active Problem List   Diagnosis Date Noted   Inclusion cyst 02/23/2023   Hypotension 01/02/2023   Transportation insecurity 12/21/2022   Dehydration 12/21/2022   Menopause 12/21/2022   Abscess 03/30/2022   Amenorrhea 10/29/2021   Abnormal findings on diagnostic imaging of liver and biliary tract 10/13/2021   Colon cancer screening 10/13/2021   Constipation 10/13/2021   Gallstones, common bile duct 10/13/2021   Gastroesophageal reflux disease 10/13/2021   History of hepatitis C 10/13/2021   Intestinal gas excretion 10/13/2021   Liver fibrosis 10/13/2021   Right upper quadrant pain 10/13/2021   Stress at home 06/21/2021   Need for influenza vaccination 06/21/2021   Periorbital hyperpigmentation 05/03/2021   Abnormal CBC 05/03/2021   Lower respiratory infection 02/04/2021   Hematuria 12/28/2020   Status post total replacement of left hip 10/12/2020   Type 2 diabetes mellitus with hyperglycemia, without long-term current use of insulin (HCC) 04/02/2020   Peripheral edema 04/02/2020   COVID-19 07/09/2019   Left hip pain 04/30/2019   Otitis externa of left ear 04/30/2019   Cholesteatoma of left ear 04/30/2019   Dysfunction of  left eustachian tube 04/30/2019   Left ear pain 04/30/2019   Sciatica 04/19/2019   Class 3 severe obesity due to excess calories with serious comorbidity and body mass index (BMI) of 45.0 to 49.9 in adult (HCC) 04/19/2019   Post-nasal drip 01/10/2019   Allergic cough 01/10/2019   Obstructive sleep apnea syndrome 12/11/2018   Acute frontal sinusitis 12/10/2018   Primary osteoarthritis of left hip 11/13/2018   Morbid obesity (HCC) 11/13/2018   Elevated cholesterol 11/12/2018   Essential hypertension 11/12/2018   Allergic rhinitis due to pollen 11/12/2018   Reactive airway disease 11/12/2018   Breast pain, right 07/17/2018   Refused influenza vaccine 07/17/2018   Chronic nonintractable headache 02/12/2018   Dysuria 02/12/2018   Localized edema 02/12/2018   Hospital discharge follow-up 01/23/2018   Bladder problem 01/05/2018   Depression 01/05/2018   Insomnia 01/05/2018   Hepatitis C infection 12/01/2015   RLS (restless legs syndrome) 10/23/2015   Elevated liver enzymes 10/22/2015   Recurrent major depression-severe (HCC) 04/17/2015   GAD (generalized anxiety disorder) 04/15/2015   Bipolar depression (HCC) 04/15/2015   Severe recurrent major depression w/psychotic features, mood-congruent (HCC) 04/14/2015   Anxiety disorder 04/14/2015   Major depressive disorder, recurrent severe without psychotic features (HCC) 04/14/2015   Suicidal ideations    Chronic LBP 01/02/2015   Adult hypothyroidism 06/25/2012   B12 deficiency 06/25/2012   Other fatigue 06/25/2012   Chronic arthralgias of knees and hips 06/25/2012   Leukocytosis 06/25/2012   Vitamin D deficiency 06/25/2012   Past Medical History:  Diagnosis Date   ADHD (attention deficit hyperactivity disorder)    Anemia    with pregnancy  Anxiety    Arthritis    Bipolar disorder (HCC)    Chronic back pain greater than 3 months duration    Depression    Diabetes mellitus without complication (HCC)    Diabetic nephropathy  (HCC)    bilateral feet   Dysuria    has to have self in and out cath    Eroded bladder suspension mesh (HCC)    GERD (gastroesophageal reflux disease)    Hepatitis    C   History of COVID-19 11/2019   History of MRSA infection    after back surgery   History of sleep apnea    resolved after weight loss   Hypertension    Hypothyroidism    Leg pain, bilateral    Obesity    Obsessive-compulsive disorder    Pneumonia 12/24/2019   Rosacea    Sciatic nerve pain    Social anxiety disorder    Squamous cell carcinoma of back 2021   Past Surgical History:  Procedure Laterality Date   BACK SURGERY     total of 6 back surgeries   bladder mesh  2009   DILITATION & CURRETTAGE/HYSTROSCOPY WITH NOVASURE ABLATION N/A 08/19/2016   Procedure: DILATATION & CURETTAGE WITH NOVASURE ABLATION;  Surgeon: Ilda Mori, MD;  Location: [facility];  Service: Gynecology;  Laterality: N/A;   EUS N/A 09/09/2015   Procedure: UPPER ENDOSCOPIC ULTRASOUND (EUS) RADIAL;  Surgeon: Willis Modena, MD;  Location: [facility];  Service: Endoscopy;  Laterality: N/A;   EYE SURGERY     INCONTINENCE SURGERY     SPINAL CORD STIMULATOR INSERTION     SPINAL CORD STIMULATOR REMOVAL     SPINAL FUSION  2008   times two   TOTAL HIP ARTHROPLASTY Left 10/12/2020   Procedure: LEFT TOTAL HIP ARTHROPLASTY ANTERIOR APPROACH;  Surgeon: Tarry Kos, MD;  Location: [facility];  Service: Orthopedics;  Laterality: Left;   TUBAL LIGATION  2006   Family History  Problem Relation Age of Onset   Schizophrenia Father    Schizophrenia Cousin    Hyperthyroidism Mother    Hypothyroidism Maternal Grandmother    Outpatient Medications Prior to Visit  Medication Sig Dispense Refill   ALPRAZolam (XANAX) 0.5 MG tablet Take 0.5 mg by mouth 3 (three) times daily as needed for anxiety.     ARIPiprazole (ABILIFY) 5 MG tablet Take 1 tablet (5 mg total) by mouth daily. 30 tablet 2   aspirin-acetaminophen-caffeine (EXCEDRIN MIGRAINE) 250-250-65 MG  tablet Take 1 tablet by mouth every 6 (six) hours as needed for migraine.     atorvastatin (LIPITOR) 20 MG tablet TAKE 1 TABLET(20 MG) BY MOUTH DAILY 100 tablet 3   blood glucose meter kit and supplies KIT Dispense based on patient and insurance preference. Check glucose before breakfast. ICD: E11.65 1 each 0   diclofenac Sodium (VOLTAREN) 1 % GEL Apply 2 g topically daily as needed. 150 g 1   doxycycline (VIBRAMYCIN) 100 MG capsule Take 1 capsule (100 mg total) by mouth 2 (two) times daily. 10 capsule 0   FINACEA 15 % FOAM Apply 1 application  topically 2 (two) times daily as needed (rosacea).     fluconazole (DIFLUCAN) 150 MG tablet Take 1 tablet by mouth once. Repeat dose in 3 days if symptoms persist. 2 tablet 0   gabapentin (NEURONTIN) 300 MG capsule TAKE 1 CAPSULE(300 MG) BY MOUTH THREE TIMES DAILY 90 capsule 2   glucose blood test strip Use as instructed, check glucose before breakfast. ICD: E11.65 100  each 12   lamoTRIgine (LAMICTAL) 150 MG tablet Take 300 mg by mouth daily.     Lancets (FREESTYLE) lancets Use as instructed. E11.65 100 each 12   levothyroxine (SYNTHROID) 75 MCG tablet Take 1 tablet (75 mcg total) by mouth daily before breakfast. TAKE 1 TABLET(75 MCG) BY MOUTH DAILY BEFORE BREAKFAST Strength: 75 mcg 90 tablet 2   metFORMIN (GLUCOPHAGE) 500 MG tablet Take 1 tablet (500 mg total) by mouth 2 (two) times daily with a meal. 180 tablet 3   methylphenidate (RITALIN) 20 MG tablet Take 20 mg by mouth 2 (two) times daily.     pantoprazole (PROTONIX) 40 MG tablet Take 1 tablet (40 mg total) by mouth daily. 90 tablet 1   polyethylene glycol powder (GLYCOLAX/MIRALAX) 17 GM/SCOOP powder Take 17 g by mouth daily. 850 g 3   Semaglutide,0.25 or 0.5MG /DOS, (OZEMPIC, 0.25 OR 0.5 MG/DOSE,) 2 MG/3ML SOPN INJECT 0.25 MG SUBCUTANEOUSLY EVERY THURSDAY 3 mL 2   traMADol (ULTRAM) 50 MG tablet Take 1 tablet (50 mg total) by mouth every 12 (twelve) hours as needed for up to 5 days. 12 tablet 0    traZODone (DESYREL) 100 MG tablet Take 100 mg by mouth at bedtime.     No facility-administered medications prior to visit.   Allergies  Allergen Reactions   Pregabalin Anaphylaxis   Bupropion Hcl Other (See Comments)   Celecoxib Nausea And Vomiting and Other (See Comments)    Stomach pain   Effexor [Venlafaxine Hcl] Other (See Comments)    Serotonin toxicity   Oxybutynin Other (See Comments)    Urinary retention   Pramipexole Other (See Comments)    Mirapex- Worsening "body jerks"   Pramipexole Dihydrochloride Other (See Comments)   Pregabalin Other (See Comments)   Prozac [Fluoxetine Hcl] Swelling   Symbyax [Olanzapine-Fluoxetine Hcl] Swelling   Venlafaxine Other (See Comments)   Desvenlafaxine Other (See Comments) and Rash    Serotonin toxicity     ROS: A complete ROS was performed with pertinent positives/negatives noted in the HPI. The remainder of the ROS are negative.    Objective:   Today's Vitals   03/01/23 0805  BP: 112/68  Pulse: 64  Temp: 97.9 F (36.6 C)  TempSrc: Temporal  SpO2: 99%  Weight: 145 lb 9.6 oz (66 kg)  Height: 5\' 4"  (1.626 m)    GENERAL: Well-appearing, in NAD. Well nourished.  SKIN: Pink, warm and dry. 1cm opening from I&D to mid chest with small amount of drainage from abscess.  NECK: Trachea midline. Full ROM w/o pain or tenderness. No lymphadenopathy.  RESPIRATORY: Chest wall symmetrical. Respirations even and non-labored. CARDIAC:  Peripheral pulses 2+ bilaterally.  EXTREMITIES: Without clubbing, cyanosis, or edema.  NEUROLOGIC:Steady, even gait.  PSYCH/MENTAL STATUS: Alert, oriented x 3. Cooperative, appropriate mood and affect.    No results found for any visits on 03/01/23.    Assessment & Plan:  1. Abscess Area repacked with 1/4 plain packing gauze, area covered with borderline gauze dressing  - return 2 days for packing removal    No orders of the defined types were placed in this encounter.  Lab Orders  No laboratory  test(s) ordered today   No images are attached to the encounter or orders placed in the encounter.  A total of 25 minutes were spent on this encounter today. When total time is documented, this includes both the face-to-face and non-face-to-face time personally spent before, during and after the visit on the date of the encounter.  Return in about 2 days (around 03/03/2023) for packing removal .   Salvatore Decent, FNP

## 2023-03-03 ENCOUNTER — Encounter: Payer: Self-pay | Admitting: Internal Medicine

## 2023-03-03 ENCOUNTER — Ambulatory Visit (INDEPENDENT_AMBULATORY_CARE_PROVIDER_SITE_OTHER): Payer: PPO | Admitting: Internal Medicine

## 2023-03-03 VITALS — BP 104/62 | HR 65 | Temp 97.9°F | Ht 64.0 in | Wt 148.8 lb

## 2023-03-03 DIAGNOSIS — L0291 Cutaneous abscess, unspecified: Secondary | ICD-10-CM

## 2023-03-03 NOTE — Progress Notes (Signed)
[facility] [address] [place] [address] Dept: [phone] Dept Fax: [phone]  Acute Care Office Visit  Subjective:   [name] Jan 20, 1971 03/03/2023  Chief Complaint  Patient presents with   Follow-up    HPI: Patient presents for wound evaluation and packing removal. Minimal to no drainage No fever.   The following portions of the patient's history were reviewed and updated as appropriate: past medical history, past surgical history, family history, social history, allergies, medications, and problem list.   Patient Active Problem List   Diagnosis Date Noted   Inclusion cyst 02/23/2023   Hypotension 01/02/2023   Transportation insecurity 12/21/2022   Dehydration 12/21/2022   Menopause 12/21/2022   Abscess 03/30/2022   Amenorrhea 10/29/2021   Abnormal findings on diagnostic imaging of liver and biliary tract 10/13/2021   Colon cancer screening 10/13/2021   Constipation 10/13/2021   Gallstones, common bile duct 10/13/2021   Gastroesophageal reflux disease 10/13/2021   History of hepatitis C 10/13/2021   Intestinal gas excretion 10/13/2021   Liver fibrosis 10/13/2021   Right upper quadrant pain 10/13/2021   Stress at home 06/21/2021   Need for influenza vaccination 06/21/2021   Periorbital hyperpigmentation 05/03/2021   Abnormal CBC 05/03/2021   Lower respiratory infection 02/04/2021   Hematuria 12/28/2020   Status post total replacement of left hip 10/12/2020   Type 2 diabetes mellitus with hyperglycemia, without long-term current use of insulin (HCC) 04/02/2020   Peripheral edema 04/02/2020   COVID-19 07/09/2019   Left hip pain 04/30/2019   Otitis externa of left ear 04/30/2019   Cholesteatoma of left ear 04/30/2019   Dysfunction of left eustachian tube 04/30/2019   Left ear pain 04/30/2019   Sciatica 04/19/2019   Class 3 severe obesity due to excess calories with serious comorbidity and  body mass index (BMI) of 45.0 to 49.9 in adult (HCC) 04/19/2019   Post-nasal drip 01/10/2019   Allergic cough 01/10/2019   Obstructive sleep apnea syndrome 12/11/2018   Acute frontal sinusitis 12/10/2018   Primary osteoarthritis of left hip 11/13/2018   Morbid obesity (HCC) 11/13/2018   Elevated cholesterol 11/12/2018   Essential hypertension 11/12/2018   Allergic rhinitis due to pollen 11/12/2018   Reactive airway disease 11/12/2018   Breast pain, right 07/17/2018   Refused influenza vaccine 07/17/2018   Chronic nonintractable headache 02/12/2018   Dysuria 02/12/2018   Localized edema 02/12/2018   Hospital discharge follow-up 01/23/2018   Bladder problem 01/05/2018   Depression 01/05/2018   Insomnia 01/05/2018   Hepatitis C infection 12/01/2015   RLS (restless legs syndrome) 10/23/2015   Elevated liver enzymes 10/22/2015   Recurrent major depression-severe (HCC) 04/17/2015   GAD (generalized anxiety disorder) 04/15/2015   Bipolar depression (HCC) 04/15/2015   Severe recurrent major depression w/psychotic features, mood-congruent (HCC) 04/14/2015   Anxiety disorder 04/14/2015   Major depressive disorder, recurrent severe without psychotic features (HCC) 04/14/2015   Suicidal ideations    Chronic LBP 01/02/2015   Adult hypothyroidism 06/25/2012   B12 deficiency 06/25/2012   Other fatigue 06/25/2012   Chronic arthralgias of knees and hips 06/25/2012   Leukocytosis 06/25/2012   Vitamin D deficiency 06/25/2012   Past Medical History:  Diagnosis Date   ADHD (attention deficit hyperactivity disorder)    Anemia    with pregnancy   Anxiety    Arthritis    Bipolar disorder (HCC)    Chronic back pain greater than 3 months duration    Depression  Diabetes mellitus without complication (HCC)    Diabetic nephropathy (HCC)    bilateral feet   Dysuria    has to have self in and out cath    Eroded bladder suspension mesh (HCC)    GERD (gastroesophageal reflux disease)     Hepatitis    C   History of COVID-19 11/2019   History of MRSA infection    after back surgery   History of sleep apnea    resolved after weight loss   Hypertension    Hypothyroidism    Leg pain, bilateral    Obesity    Obsessive-compulsive disorder    Pneumonia 12/24/2019   Rosacea    Sciatic nerve pain    Social anxiety disorder    Squamous cell carcinoma of back 2021   Past Surgical History:  Procedure Laterality Date   BACK SURGERY     total of 6 back surgeries   bladder mesh  2009   DILITATION & CURRETTAGE/HYSTROSCOPY WITH NOVASURE ABLATION N/A 08/19/2016   Procedure: DILATATION & CURETTAGE WITH NOVASURE ABLATION;  Surgeon: Ilda Mori, MD;  Location: [facility];  Service: Gynecology;  Laterality: N/A;   EUS N/A 09/09/2015   Procedure: UPPER ENDOSCOPIC ULTRASOUND (EUS) RADIAL;  Surgeon: Willis Modena, MD;  Location: [facility];  Service: Endoscopy;  Laterality: N/A;   EYE SURGERY     INCONTINENCE SURGERY     SPINAL CORD STIMULATOR INSERTION     SPINAL CORD STIMULATOR REMOVAL     SPINAL FUSION  2008   times two   TOTAL HIP ARTHROPLASTY Left 10/12/2020   Procedure: LEFT TOTAL HIP ARTHROPLASTY ANTERIOR APPROACH;  Surgeon: Tarry Kos, MD;  Location: [facility];  Service: Orthopedics;  Laterality: Left;   TUBAL LIGATION  2006   Family History  Problem Relation Age of Onset   Schizophrenia Father    Schizophrenia Cousin    Hyperthyroidism Mother    Hypothyroidism Maternal Grandmother    Outpatient Medications Prior to Visit  Medication Sig Dispense Refill   ALPRAZolam (XANAX) 0.5 MG tablet Take 0.5 mg by mouth 3 (three) times daily as needed for anxiety.     ARIPiprazole (ABILIFY) 5 MG tablet Take 1 tablet (5 mg total) by mouth daily. 30 tablet 2   aspirin-acetaminophen-caffeine (EXCEDRIN MIGRAINE) 250-250-65 MG tablet Take 1 tablet by mouth every 6 (six) hours as needed for migraine.     atorvastatin (LIPITOR) 20 MG tablet TAKE 1 TABLET(20 MG) BY MOUTH DAILY 100 tablet 3    blood glucose meter kit and supplies KIT Dispense based on patient and insurance preference. Check glucose before breakfast. ICD: E11.65 1 each 0   diclofenac Sodium (VOLTAREN) 1 % GEL Apply 2 g topically daily as needed. 150 g 1   doxycycline (VIBRAMYCIN) 100 MG capsule Take 1 capsule (100 mg total) by mouth 2 (two) times daily. 10 capsule 0   FINACEA 15 % FOAM Apply 1 application  topically 2 (two) times daily as needed (rosacea).     fluconazole (DIFLUCAN) 150 MG tablet Take 1 tablet by mouth once. Repeat dose in 3 days if symptoms persist. 2 tablet 0   gabapentin (NEURONTIN) 300 MG capsule TAKE 1 CAPSULE(300 MG) BY MOUTH THREE TIMES DAILY 90 capsule 2   glucose blood test strip Use as instructed, check glucose before breakfast. ICD: E11.65 100 each 12   lamoTRIgine (LAMICTAL) 150 MG tablet Take 300 mg by mouth daily.     Lancets (FREESTYLE) lancets Use as instructed. E11.65 100 each 12  levothyroxine (SYNTHROID) 75 MCG tablet Take 1 tablet (75 mcg total) by mouth daily before breakfast. TAKE 1 TABLET(75 MCG) BY MOUTH DAILY BEFORE BREAKFAST Strength: 75 mcg 90 tablet 2   metFORMIN (GLUCOPHAGE) 500 MG tablet Take 1 tablet (500 mg total) by mouth 2 (two) times daily with a meal. 180 tablet 3   methylphenidate (RITALIN) 20 MG tablet Take 20 mg by mouth 2 (two) times daily.     pantoprazole (PROTONIX) 40 MG tablet Take 1 tablet (40 mg total) by mouth daily. 90 tablet 1   polyethylene glycol powder (GLYCOLAX/MIRALAX) 17 GM/SCOOP powder Take 17 g by mouth daily. 850 g 3   Semaglutide,0.25 or 0.5MG /DOS, (OZEMPIC, 0.25 OR 0.5 MG/DOSE,) 2 MG/3ML SOPN INJECT 0.25 MG SUBCUTANEOUSLY EVERY THURSDAY 3 mL 2   traMADol (ULTRAM) 50 MG tablet Take 1 tablet (50 mg total) by mouth every 12 (twelve) hours as needed for up to 5 days. 12 tablet 0   traZODone (DESYREL) 100 MG tablet Take 100 mg by mouth at bedtime.     No facility-administered medications prior to visit.   Allergies  Allergen Reactions    Pregabalin Anaphylaxis   Bupropion Hcl Other (See Comments)   Celecoxib Nausea And Vomiting and Other (See Comments)    Stomach pain   Effexor [Venlafaxine Hcl] Other (See Comments)    Serotonin toxicity   Oxybutynin Other (See Comments)    Urinary retention   Pramipexole Other (See Comments)    Mirapex- Worsening "body jerks"   Pramipexole Dihydrochloride Other (See Comments)   Pregabalin Other (See Comments)   Prozac [Fluoxetine Hcl] Swelling   Symbyax [Olanzapine-Fluoxetine Hcl] Swelling   Venlafaxine Other (See Comments)   Desvenlafaxine Other (See Comments) and Rash    Serotonin toxicity     ROS: A complete ROS was performed with pertinent positives/negatives noted in the HPI. The remainder of the ROS are negative.    Objective:   Today's Vitals   03/03/23 0802  BP: 104/62  Pulse: 65  Temp: 97.9 F (36.6 C)  TempSrc: Temporal  SpO2: 99%  Weight: 148 lb 12.8 oz (67.5 kg)  Height: 5\' 4"  (1.626 m)    GENERAL: Well-appearing, in NAD. Well nourished.  SKIN: Pink, warm and dry. Adequate healing post I&D to abscess of mid chest. Minimal to no drainage. Packing intact.  RESPIRATORY: Chest wall symmetrical. Respirations even and non-labored.  EXTREMITIES: Without clubbing, cyanosis, or edema.  NEUROLOGIC: Steady, even gait.  PSYCH/MENTAL STATUS: Alert, oriented x 3. Cooperative, appropriate mood and affect.    No results found for any visits on 03/03/23.    Assessment & Plan:  1. Abscess Packing removed, pt tolerated well.  Area healing appropriately. Borderline gauze placed.    No orders of the defined types were placed in this encounter.  Lab Orders  No laboratory test(s) ordered today   No images are attached to the encounter or orders placed in the encounter.  Return if symptoms worsen or fail to improve.   Of note, portions of this note may have been created with voice recognition software Physicist, medical). While this note has been edited for accuracy,  occasional wrong-word or 'sound-a-like' substitutions may have occurred due to the inherent limitations of voice recognition software.   Salvatore Decent, FNP

## 2023-04-12 ENCOUNTER — Ambulatory Visit (INDEPENDENT_AMBULATORY_CARE_PROVIDER_SITE_OTHER): Payer: PPO | Admitting: Family Medicine

## 2023-04-12 ENCOUNTER — Ambulatory Visit: Payer: Self-pay

## 2023-04-12 ENCOUNTER — Encounter: Payer: Self-pay | Admitting: Family Medicine

## 2023-04-12 VITALS — BP 114/64 | HR 57 | Temp 98.4°F | Ht 64.0 in | Wt 146.0 lb

## 2023-04-12 DIAGNOSIS — R233 Spontaneous ecchymoses: Secondary | ICD-10-CM | POA: Diagnosis not present

## 2023-04-12 DIAGNOSIS — L720 Epidermal cyst: Secondary | ICD-10-CM

## 2023-04-12 DIAGNOSIS — E1165 Type 2 diabetes mellitus with hyperglycemia: Secondary | ICD-10-CM

## 2023-04-12 LAB — BASIC METABOLIC PANEL
BUN: 14 mg/dL (ref 6–23)
CO2: 30 mEq/L (ref 19–32)
Calcium: 9.8 mg/dL (ref 8.4–10.5)
Chloride: 103 mEq/L (ref 96–112)
Creatinine, Ser: 0.76 mg/dL (ref 0.40–1.20)
GFR: 91.44 mL/min (ref >60.00)
Glucose, Bld: 83 mg/dL (ref 70–99)
Potassium: 4 mEq/L (ref 3.5–5.1)
Sodium: 139 mEq/L (ref 135–145)

## 2023-04-12 LAB — CBC WITH DIFFERENTIAL/PLATELET
Basophils Absolute: 0 10*3/uL (ref 0.0–0.1)
Basophils Relative: 0.5 % (ref 0.0–3.0)
Eosinophils Absolute: 0.1 10*3/uL (ref 0.0–0.7)
Eosinophils Relative: 1.6 % (ref 0.0–5.0)
HCT: 41.2 % (ref 36.0–46.0)
Hemoglobin: 13.4 g/dL (ref 12.0–15.0)
Lymphocytes Relative: 38.8 % (ref 12.0–46.0)
Lymphs Abs: 2.6 10*3/uL (ref 0.7–4.0)
MCHC: 32.5 g/dL (ref 30.0–36.0)
MCV: 86.8 fl (ref 78.0–100.0)
Monocytes Absolute: 0.5 10*3/uL (ref 0.1–1.0)
Monocytes Relative: 6.9 % (ref 3.0–12.0)
Neutro Abs: 3.5 10*3/uL (ref 1.4–7.7)
Neutrophils Relative %: 52.2 % (ref 43.0–77.0)
Platelets: 262 10*3/uL (ref 150.0–400.0)
RBC: 4.75 Mil/uL (ref 3.87–5.11)
RDW: 15.3 % (ref 11.5–15.5)
WBC: 6.6 10*3/uL (ref 4.0–10.5)

## 2023-04-12 LAB — HEPATIC FUNCTION PANEL
ALT: 25 U/L (ref 0–35)
AST: 17 U/L (ref 0–37)
Albumin: 4.4 g/dL (ref 3.5–5.2)
Alkaline Phosphatase: 64 U/L (ref 39–117)
Bilirubin, Direct: 0.1 mg/dL (ref 0.0–0.3)
Total Bilirubin: 0.5 mg/dL (ref 0.2–1.2)
Total Protein: 6.6 g/dL (ref 6.0–8.3)

## 2023-04-12 LAB — HEMOGLOBIN A1C: Hgb A1c MFr Bld: 5 % (ref 4.6–6.5)

## 2023-04-12 NOTE — Patient Instructions (Signed)
Visit Information  Thank you for taking time to visit with me today. Please don't hesitate to contact me if I can be of assistance to you.   Following are the goals we discussed today:   Goals Addressed             This Visit's Progress    Diabetes Management       Patient Goals/Self Care Activities: -Patient/Caregiver will self-administer medications as prescribed as evidenced by self-report/primary caregiver report  -Patient/Caregiver will attend all scheduled provider appointments as evidenced by clinician review of documented attendance to scheduled appointments and patient/caregiver report -Patient/Caregiver will call provider office for new concerns or questions as evidenced by review of documented incoming telephone call notes and patient report  -check blood sugar at prescribed times -check blood sugar if I feel it is too high or too low -record values and write them down take them to all doctor visits    Patient doing okay some life stresses but overall okay. Saw PCP for visit.  Increase in ozempic due to food banks giving more carbohydrates.  Not checking sugars as she has no strips right now and cannot afford them.  A1c labs done at PCP office today.  Diabetes management reiterated.           Our next appointment is by telephone on 06/01/23 at 1200 pm  Please call the care guide team at [phone] if you need to cancel or reschedule your appointment.   If you are experiencing a Mental Health or Behavioral Health Crisis or need someone to talk to, please call the Suicide and Crisis Lifeline: 988   Patient verbalizes understanding of instructions and care plan provided today and agrees to view in MyChart. Active MyChart status and patient understanding of how to access instructions and care plan via MyChart confirmed with patient.     The patient has been provided with contact information for the care management team and has been advised to call with any health related  questions or concerns.   Bary Leriche, RN, MSN [facility] Care Management Care Management Coordinator Direct Line [phone]

## 2023-04-12 NOTE — Progress Notes (Signed)
Established Patient Office Visit   Subjective:  Patient ID: [name], female    DOB: 1971/08/05  Age: 52 y.o. MRN: [id number]  Chief Complaint  Patient presents with   polyphagia    Pt states she is eating a lot. Pt states she can eat a full meal and still feels hungry x 2 months.    Bleeding/Bruising    Pt states she has a lot of bruising that started 3 weeks ago. No fall or injury pt can recall.     HPI Encounter Diagnoses  Name Primary?   Bruising tendency Yes   Type 2 diabetes mellitus with hyperglycemia, without long-term current use of insulin (HCC)    Inclusion cyst    For follow-up of above.  Patient is feeling better.  She has been going to the gym and working out.  Inclusion cyst continues to drain some.  She has noticed some spontaneous bruising about her legs.  There are multiple stressors in her life now.   Review of Systems  Constitutional: Negative.   HENT: Negative.    Eyes:  Negative for blurred vision, discharge and redness.  Respiratory: Negative.    Cardiovascular: Negative.   Gastrointestinal:  Negative for abdominal pain.  Genitourinary: Negative.   Musculoskeletal: Negative.  Negative for myalgias.  Skin:  Negative for rash.  Neurological:  Negative for tingling, loss of consciousness and weakness.  Endo/Heme/Allergies:  Negative for polydipsia.     Current Outpatient Medications:    ALPRAZolam (XANAX) 0.5 MG tablet, Take 0.5 mg by mouth 3 (three) times daily as needed for anxiety., Disp: , Rfl:    ARIPiprazole (ABILIFY) 5 MG tablet, Take 1 tablet (5 mg total) by mouth daily., Disp: 30 tablet, Rfl: 2   aspirin-acetaminophen-caffeine (EXCEDRIN MIGRAINE) 250-250-65 MG tablet, Take 1 tablet by mouth every 6 (six) hours as needed for migraine., Disp: , Rfl:    atorvastatin (LIPITOR) 20 MG tablet, TAKE 1 TABLET(20 MG) BY MOUTH DAILY, Disp: 100 tablet, Rfl: 3   blood glucose meter kit and supplies KIT, Dispense based on patient and insurance  preference. Check glucose before breakfast. ICD: E11.65, Disp: 1 each, Rfl: 0   diclofenac Sodium (VOLTAREN) 1 % GEL, Apply 2 g topically daily as needed., Disp: 150 g, Rfl: 1   doxycycline (VIBRAMYCIN) 100 MG capsule, Take 1 capsule (100 mg total) by mouth 2 (two) times daily., Disp: 10 capsule, Rfl: 0   FINACEA 15 % FOAM, Apply 1 application  topically 2 (two) times daily as needed (rosacea)., Disp: , Rfl:    gabapentin (NEURONTIN) 300 MG capsule, TAKE 1 CAPSULE(300 MG) BY MOUTH THREE TIMES DAILY, Disp: 90 capsule, Rfl: 2   glucose blood test strip, Use as instructed, check glucose before breakfast. ICD: E11.65, Disp: 100 each, Rfl: 12   lamoTRIgine (LAMICTAL) 150 MG tablet, Take 300 mg by mouth daily., Disp: , Rfl:    Lancets (FREESTYLE) lancets, Use as instructed. E11.65, Disp: 100 each, Rfl: 12   levothyroxine (SYNTHROID) 75 MCG tablet, Take 1 tablet (75 mcg total) by mouth daily before breakfast. TAKE 1 TABLET(75 MCG) BY MOUTH DAILY BEFORE BREAKFAST Strength: 75 mcg, Disp: 90 tablet, Rfl: 2   metFORMIN (GLUCOPHAGE) 500 MG tablet, Take 1 tablet (500 mg total) by mouth 2 (two) times daily with a meal., Disp: 180 tablet, Rfl: 3   methylphenidate (RITALIN) 20 MG tablet, Take 20 mg by mouth 2 (two) times daily., Disp: , Rfl:    pantoprazole (PROTONIX) 40 MG tablet, Take 1  tablet (40 mg total) by mouth daily., Disp: 90 tablet, Rfl: 1   polyethylene glycol powder (GLYCOLAX/MIRALAX) 17 GM/SCOOP powder, Take 17 g by mouth daily., Disp: 850 g, Rfl: 3   Semaglutide,0.25 or 0.5MG /DOS, (OZEMPIC, 0.25 OR 0.5 MG/DOSE,) 2 MG/3ML SOPN, INJECT 0.25 MG SUBCUTANEOUSLY EVERY THURSDAY, Disp: 3 mL, Rfl: 2   traZODone (DESYREL) 100 MG tablet, Take 100 mg by mouth at bedtime., Disp: , Rfl:    Objective:     BP 114/64   Pulse (!) 57   Temp 98.4 F (36.9 C)   Ht 5\' 4"  (1.626 m)   Wt 146 lb (66.2 kg)   SpO2 97%   BMI 25.06 kg/m  Wt Readings from Last 3 Encounters:  04/12/23 146 lb (66.2 kg)  03/03/23 148 lb  12.8 oz (67.5 kg)  03/01/23 145 lb 9.6 oz (66 kg)      Physical Exam Constitutional:      General: She is not in acute distress.    Appearance: Normal appearance. She is not ill-appearing, toxic-appearing or diaphoretic.  HENT:     Head: Normocephalic and atraumatic.     Right Ear: External ear normal.     Left Ear: External ear normal.     Mouth/Throat:     Mouth: Mucous membranes are moist.     Pharynx: Oropharynx is clear. No oropharyngeal exudate or posterior oropharyngeal erythema.  Eyes:     General: No scleral icterus.       Right eye: No discharge.        Left eye: No discharge.     Extraocular Movements: Extraocular movements intact.     Conjunctiva/sclera: Conjunctivae normal.     Pupils: Pupils are equal, round, and reactive to light.  Cardiovascular:     Rate and Rhythm: Normal rate and regular rhythm.  Pulmonary:     Effort: Pulmonary effort is normal. No respiratory distress.     Breath sounds: Normal breath sounds. No wheezing, rhonchi or rales.  Abdominal:     General: Bowel sounds are normal.     Tenderness: There is no abdominal tenderness. There is no guarding or rebound.  Musculoskeletal:     Cervical back: No rigidity or tenderness.  Lymphadenopathy:     Cervical: No cervical adenopathy.  Skin:    General: Skin is warm and dry.  Neurological:     Mental Status: She is alert and oriented to person, place, and time.  Psychiatric:        Mood and Affect: Mood normal.        Behavior: Behavior normal.      No results found for any visits on 04/12/23.    The 10-year ASCVD risk score (Arnett DK, et al., 2019) is: 5%    Assessment & Plan:   Bruising tendency -     CBC with Differential/Platelet -     Hepatic function panel -     Protime-INR; Future -     APTT; Future  Type 2 diabetes mellitus with hyperglycemia, without long-term current use of insulin (HCC) -     Basic metabolic panel -     Hemoglobin A1c  Inclusion cyst -      Ambulatory referral to General Surgery    Return in about 3 months (around 07/13/2023).    Mliss Sax, MD

## 2023-04-12 NOTE — Progress Notes (Signed)
Pharmacy Quality Measure Review  This patient is appearing on a report for being at risk of failing the adherence measure for diabetes medications this calendar year.   Medication: Metformin 500 mg BID Last fill date: 03/20/23 for 30 day supply  Insurance report was not up to date. Rx written for 90d, although pharmacy recently filling 30d. No action needed at this time.   Nils Pyle, PharmD PGY1 Pharmacy Resident

## 2023-04-12 NOTE — Patient Outreach (Signed)
  Care Coordination   Follow Up Visit Note   04/12/2023 Name: [name] MRN: [id number] DOB: 04/01/1971  [name] is a 52 y.o. year old female who sees Claudia Sax, MD for primary care. I spoke with  [name] by phone today.  What matters to the patients health and wellness today?  Maintain health    Goals Addressed             This Visit's Progress    Diabetes Management       Patient Goals/Self Care Activities: -Patient/Caregiver will self-administer medications as prescribed as evidenced by self-report/primary caregiver report  -Patient/Caregiver will attend all scheduled provider appointments as evidenced by clinician review of documented attendance to scheduled appointments and patient/caregiver report -Patient/Caregiver will call provider office for new concerns or questions as evidenced by review of documented incoming telephone call notes and patient report  -check blood sugar at prescribed times -check blood sugar if I feel it is too high or too low -record values and write them down take them to all doctor visits    Patient doing okay some life stresses but overall okay. Saw PCP for visit.  Increase in ozempic due to food banks giving more carbohydrates.  Not checking sugars as she has no strips right now and cannot afford them.  A1c labs done at PCP office today.  Diabetes management reiterated.           SDOH assessments and interventions completed:  Yes     Care Coordination Interventions:  Yes, provided   Follow up plan: Follow up call scheduled for September    Encounter Outcome:  Pt. Visit Completed   Bary Leriche, RN, MSN [facility] Care Management Care Management Coordinator Direct Line [phone]

## 2023-04-14 ENCOUNTER — Other Ambulatory Visit: Payer: Self-pay | Admitting: Family Medicine

## 2023-04-14 DIAGNOSIS — Z1231 Encounter for screening mammogram for malignant neoplasm of breast: Secondary | ICD-10-CM

## 2023-04-17 ENCOUNTER — Other Ambulatory Visit: Payer: Self-pay

## 2023-04-17 ENCOUNTER — Ambulatory Visit: Payer: PPO | Admitting: Family Medicine

## 2023-04-17 ENCOUNTER — Telehealth: Payer: Self-pay | Admitting: Family Medicine

## 2023-04-17 MED ORDER — OZEMPIC (0.25 OR 0.5 MG/DOSE) 2 MG/3ML ~~LOC~~ SOPN: 0.2500 mg | PEN_INJECTOR | SUBCUTANEOUS | 2 refills | Status: DC

## 2023-04-17 NOTE — Telephone Encounter (Signed)
Pt is ready for her Ozempic to be refilled.

## 2023-04-20 ENCOUNTER — Other Ambulatory Visit: Payer: Self-pay | Admitting: Family Medicine

## 2023-04-20 DIAGNOSIS — G8929 Other chronic pain: Secondary | ICD-10-CM

## 2023-04-20 DIAGNOSIS — G2581 Restless legs syndrome: Secondary | ICD-10-CM

## 2023-04-25 DIAGNOSIS — F429 Obsessive-compulsive disorder, unspecified: Secondary | ICD-10-CM | POA: Diagnosis not present

## 2023-04-25 DIAGNOSIS — F411 Generalized anxiety disorder: Secondary | ICD-10-CM | POA: Diagnosis not present

## 2023-04-25 DIAGNOSIS — F909 Attention-deficit hyperactivity disorder, unspecified type: Secondary | ICD-10-CM | POA: Diagnosis not present

## 2023-04-25 DIAGNOSIS — F3132 Bipolar disorder, current episode depressed, moderate: Secondary | ICD-10-CM | POA: Diagnosis not present

## 2023-04-26 ENCOUNTER — Ambulatory Visit: Payer: PPO

## 2023-05-12 DIAGNOSIS — F909 Attention-deficit hyperactivity disorder, unspecified type: Secondary | ICD-10-CM | POA: Diagnosis not present

## 2023-05-12 DIAGNOSIS — F429 Obsessive-compulsive disorder, unspecified: Secondary | ICD-10-CM | POA: Diagnosis not present

## 2023-05-12 DIAGNOSIS — F411 Generalized anxiety disorder: Secondary | ICD-10-CM | POA: Diagnosis not present

## 2023-05-12 DIAGNOSIS — F3132 Bipolar disorder, current episode depressed, moderate: Secondary | ICD-10-CM | POA: Diagnosis not present

## 2023-05-14 ENCOUNTER — Emergency Department (HOSPITAL_BASED_OUTPATIENT_CLINIC_OR_DEPARTMENT_OTHER)
Admission: EM | Admit: 2023-05-14 | Discharge: 2023-05-14 | Discharge status: Against Medical Advice | Payer: PPO | Attending: Emergency Medicine | Admitting: Emergency Medicine

## 2023-05-14 ENCOUNTER — Encounter (HOSPITAL_BASED_OUTPATIENT_CLINIC_OR_DEPARTMENT_OTHER): Payer: Self-pay | Admitting: Emergency Medicine

## 2023-05-14 ENCOUNTER — Ambulatory Visit (HOSPITAL_COMMUNITY)
Admission: EM | Admit: 2023-05-14 | Discharge: 2023-05-14 | Disposition: A | Discharge status: Home / Self Care | Payer: PPO | Attending: Emergency Medicine | Admitting: Emergency Medicine

## 2023-05-14 ENCOUNTER — Other Ambulatory Visit: Payer: Self-pay

## 2023-05-14 ENCOUNTER — Encounter (HOSPITAL_COMMUNITY): Payer: Self-pay

## 2023-05-14 DIAGNOSIS — E119 Type 2 diabetes mellitus without complications: Secondary | ICD-10-CM | POA: Diagnosis not present

## 2023-05-14 DIAGNOSIS — K047 Periapical abscess without sinus: Secondary | ICD-10-CM

## 2023-05-14 DIAGNOSIS — K0889 Other specified disorders of teeth and supporting structures: Secondary | ICD-10-CM | POA: Diagnosis not present

## 2023-05-14 DIAGNOSIS — S025XXB Fracture of tooth (traumatic), initial encounter for open fracture: Secondary | ICD-10-CM

## 2023-05-14 DIAGNOSIS — R220 Localized swelling, mass and lump, head: Secondary | ICD-10-CM | POA: Diagnosis not present

## 2023-05-14 DIAGNOSIS — Z5321 Procedure and treatment not carried out due to patient leaving prior to being seen by health care provider: Secondary | ICD-10-CM | POA: Diagnosis not present

## 2023-05-14 MED ORDER — LIDOCAINE VISCOUS HCL 2 % MT SOLN
OROMUCOSAL | Status: AC
  Filled 2023-05-14: qty 15

## 2023-05-14 MED ORDER — LIDOCAINE VISCOUS HCL 2 % MT SOLN: 15.0000 mL | OROMUCOSAL | 0 refills | Status: DC | PRN

## 2023-05-14 MED ORDER — FLUCONAZOLE 150 MG PO TABS
150.0000 mg | ORAL_TABLET | Freq: Once | ORAL | 0 refills | Status: AC
Start: 2023-05-14 — End: 2023-05-14

## 2023-05-14 MED ORDER — AMOXICILLIN-POT CLAVULANATE 875-125 MG PO TABS
1.0000 | ORAL_TABLET | Freq: Two times a day (BID) | ORAL | 0 refills | Status: AC
Start: 2023-05-14 — End: 2023-05-19

## 2023-05-14 MED ORDER — LIDOCAINE VISCOUS HCL 2 % MT SOLN
15.0000 mL | Freq: Once | OROMUCOSAL | Status: AC
  Administered 2023-05-14: 15 mL via OROMUCOSAL

## 2023-05-14 MED ORDER — TRAMADOL HCL 50 MG PO TABS: 50.0000 mg | ORAL_TABLET | Freq: Three times a day (TID) | ORAL | 0 refills | Status: DC | PRN

## 2023-05-14 NOTE — Discharge Instructions (Addendum)
Take medications as ordered.  Complete all antibiotics.  Follow-up with dentist for broken tooth.  Consider salt water gargles 3 times a day. Use viscous lidocaine and area of broken tooth. Take Motrin 800 mg 3 times a day which you already have at home for the pain.  However tramadol has been ordered for pain that is not controlled with the Motrin. Warm compresses as needed.

## 2023-05-14 NOTE — ED Triage Notes (Signed)
Pt is here for abscess in her mouth. Pt reports ear pain due to her tooth. Pt request we check her sugar.

## 2023-05-14 NOTE — ED Triage Notes (Signed)
Left upper dental pain x 2 days , facial swelling . Hx diabetes she said

## 2023-05-14 NOTE — ED Provider Notes (Addendum)
[facility]    CSN: [id number] Arrival date & time: 05/14/23  1357      History   Chief Complaint No chief complaint on file.   HPI [name] is a 52 y.o. female.  Patient has a broken tooth and has not seen a dentist.  She now has a large dental abscess.  Swelling of the left cheek and jaw is noted x 2 days.  The history is provided by the patient.  Dental Pain Location:  Upper Upper teeth location:  14/LU 1st molar Quality:  Pulsating, pressure-like and radiating Severity:  Moderate Onset quality:  Gradual Duration:  3 weeks Timing:  Constant Progression:  Worsening Context: abscess, dental caries and dental fracture   Relieved by:  Nothing Ineffective treatments:  NSAIDs Associated symptoms: facial pain, facial swelling and gum swelling   Risk factors: lack of dental care     Past Medical History:  Diagnosis Date   ADHD (attention deficit hyperactivity disorder)    Anemia    with pregnancy   Anxiety    Arthritis    Bipolar disorder (HCC)    Chronic back pain greater than 3 months duration    Depression    Diabetes mellitus without complication (HCC)    Diabetic nephropathy (HCC)    bilateral feet   Dysuria    has to have self in and out cath    Eroded bladder suspension mesh (HCC)    GERD (gastroesophageal reflux disease)    Hepatitis    C   History of COVID-19 11/2019   History of MRSA infection    after back surgery   History of sleep apnea    resolved after weight loss   Hypertension    Hypothyroidism    Leg pain, bilateral    Obesity    Obsessive-compulsive disorder    Pneumonia 12/24/2019   Rosacea    Sciatic nerve pain    Social anxiety disorder    Squamous cell carcinoma of back 2021    Patient Active Problem List   Diagnosis Date Noted   Inclusion cyst 02/23/2023   Hypotension 01/02/2023   Transportation insecurity 12/21/2022   Dehydration 12/21/2022   Menopause 12/21/2022   Abscess 03/30/2022   Amenorrhea  10/29/2021   Abnormal findings on diagnostic imaging of liver and biliary tract 10/13/2021   Colon cancer screening 10/13/2021   Constipation 10/13/2021   Gallstones, common bile duct 10/13/2021   Gastroesophageal reflux disease 10/13/2021   History of hepatitis C 10/13/2021   Intestinal gas excretion 10/13/2021   Liver fibrosis 10/13/2021   Right upper quadrant pain 10/13/2021   Stress at home 06/21/2021   Need for influenza vaccination 06/21/2021   Periorbital hyperpigmentation 05/03/2021   Abnormal CBC 05/03/2021   Lower respiratory infection 02/04/2021   Hematuria 12/28/2020   Status post total replacement of left hip 10/12/2020   Type 2 diabetes mellitus with hyperglycemia, without long-term current use of insulin (HCC) 04/02/2020   Peripheral edema 04/02/2020   COVID-19 07/09/2019   Left hip pain 04/30/2019   Otitis externa of left ear 04/30/2019   Cholesteatoma of left ear 04/30/2019   Dysfunction of left eustachian tube 04/30/2019   Left ear pain 04/30/2019   Sciatica 04/19/2019   Class 3 severe obesity due to excess calories with serious comorbidity and body mass index (BMI) of 45.0 to 49.9 in adult (HCC) 04/19/2019   Post-nasal drip 01/10/2019   Allergic cough 01/10/2019   Obstructive sleep apnea syndrome 12/11/2018  Acute frontal sinusitis 12/10/2018   Primary osteoarthritis of left hip 11/13/2018   Morbid obesity (HCC) 11/13/2018   Elevated cholesterol 11/12/2018   Essential hypertension 11/12/2018   Allergic rhinitis due to pollen 11/12/2018   Reactive airway disease 11/12/2018   Breast pain, right 07/17/2018   Refused influenza vaccine 07/17/2018   Chronic nonintractable headache 02/12/2018   Dysuria 02/12/2018   Localized edema 02/12/2018   Hospital discharge follow-up 01/23/2018   Bladder problem 01/05/2018   Depression 01/05/2018   Insomnia 01/05/2018   Hepatitis C infection 12/01/2015   RLS (restless legs syndrome) 10/23/2015   Elevated liver enzymes  10/22/2015   Recurrent major depression-severe (HCC) 04/17/2015   GAD (generalized anxiety disorder) 04/15/2015   Bipolar depression (HCC) 04/15/2015   Severe recurrent major depression w/psychotic features, mood-congruent (HCC) 04/14/2015   Anxiety disorder 04/14/2015   Major depressive disorder, recurrent severe without psychotic features (HCC) 04/14/2015   Suicidal ideations    Chronic LBP 01/02/2015   Adult hypothyroidism 06/25/2012   B12 deficiency 06/25/2012   Other fatigue 06/25/2012   Chronic arthralgias of knees and hips 06/25/2012   Leukocytosis 06/25/2012   Vitamin D deficiency 06/25/2012    Past Surgical History:  Procedure Laterality Date   BACK SURGERY     total of 6 back surgeries   bladder mesh  2009   DILITATION & CURRETTAGE/HYSTROSCOPY WITH NOVASURE ABLATION N/A 08/19/2016   Procedure: DILATATION & CURETTAGE WITH NOVASURE ABLATION;  Surgeon: Ilda Mori, MD;  Location: [facility];  Service: Gynecology;  Laterality: N/A;   EUS N/A 09/09/2015   Procedure: UPPER ENDOSCOPIC ULTRASOUND (EUS) RADIAL;  Surgeon: Willis Modena, MD;  Location: [facility];  Service: Endoscopy;  Laterality: N/A;   EYE SURGERY     INCONTINENCE SURGERY     SPINAL CORD STIMULATOR INSERTION     SPINAL CORD STIMULATOR REMOVAL     SPINAL FUSION  2008   times two   TOTAL HIP ARTHROPLASTY Left 10/12/2020   Procedure: LEFT TOTAL HIP ARTHROPLASTY ANTERIOR APPROACH;  Surgeon: Tarry Kos, MD;  Location: [facility];  Service: Orthopedics;  Laterality: Left;   TUBAL LIGATION  2006    OB History     Gravida  3   Para      Term      Preterm      AB  1   Living  2      SAB      IAB  1   Ectopic      Multiple      Live Births  2            Home Medications    Prior to Admission medications   Medication Sig Start Date End Date Taking? Authorizing Provider  ALPRAZolam Prudy Feeler) 0.5 MG tablet Take 0.5 mg by mouth 3 (three) times daily as needed for anxiety. 12/20/19  Yes [provider]  amoxicillin-clavulanate (AUGMENTIN) 875-125 MG tablet Take 1 tablet by mouth every 12 (twelve) hours for 5 days. 05/14/23 05/19/23 Yes Aislyn Hayse, Linde Gillis, NP  ARIPiprazole (ABILIFY) 5 MG tablet Take 1 tablet (5 mg total) by mouth daily. 09/17/15  Yes Benjaman Pott, MD  aspirin-acetaminophen-caffeine (EXCEDRIN MIGRAINE) [phone] MG tablet Take 1 tablet by mouth every 6 (six) hours as needed for migraine.   Yes [provider]  atorvastatin (LIPITOR) 20 MG tablet TAKE 1 TABLET(20 MG) BY MOUTH DAILY 02/23/23  Yes Mliss Sax, MD  blood glucose meter kit and supplies KIT Dispense based on  patient and insurance preference. Check glucose before breakfast. ICD: E11.65 04/02/20  Yes Nche, Bonna Gains, NP  diclofenac Sodium (VOLTAREN) 1 % GEL Apply 2 g topically daily as needed. 06/20/22  Yes Mliss Sax, MD  FINACEA 15 % FOAM Apply 1 application  topically 2 (two) times daily as needed (rosacea). 02/14/20  Yes [provider]  fluconazole (DIFLUCAN) 150 MG tablet Take 1 tablet (150 mg total) by mouth once for 1 dose. 05/14/23 05/14/23 Yes Demone Lyles, Linde Gillis, NP  gabapentin (NEURONTIN) 300 MG capsule TAKE ONE CAPSULE BY MOUTH THREE TIMES A DAY 04/20/23  Yes Mliss Sax, MD  glucose blood test strip Use as instructed, check glucose before breakfast. ICD: E11.65 07/21/20  Yes Mliss Sax, MD  lamoTRIgine (LAMICTAL) 150 MG tablet Take 300 mg by mouth daily. 02/22/20  Yes [provider]  Lancets (FREESTYLE) lancets Use as instructed. E11.65 07/21/20  Yes Mliss Sax, MD  levothyroxine (SYNTHROID) 75 MCG tablet Take 1 tablet (75 mcg total) by mouth daily before breakfast. TAKE 1 TABLET(75 MCG) BY MOUTH DAILY BEFORE BREAKFAST Strength: 75 mcg 02/24/23  Yes Mliss Sax, MD  lidocaine (XYLOCAINE) 2 % solution Use as directed 15 mLs in the mouth or throat every 3 (three) hours as needed for mouth pain. Swish and spit.  05/14/23  Yes Zan Orlick, Linde Gillis, NP  metFORMIN (GLUCOPHAGE) 500 MG tablet Take 1 tablet (500 mg total) by mouth 2 (two) times daily with a meal. 02/23/23  Yes Mliss Sax, MD  methylphenidate (RITALIN) 20 MG tablet Take 20 mg by mouth 2 (two) times daily. 09/22/20  Yes [provider]  pantoprazole (PROTONIX) 40 MG tablet Take 1 tablet (40 mg total) by mouth daily. 12/21/22  Yes Mliss Sax, MD  polyethylene glycol powder [facility]) 17 GM/SCOOP powder Take 17 g by mouth daily. 11/30/22  Yes Nche, Bonna Gains, NP  Semaglutide,0.25 or 0.5MG /DOS, (OZEMPIC, 0.25 OR 0.5 MG/DOSE,) 2 MG/3ML SOPN Inject 0.25 mg into the skin once a week. 04/17/23  Yes Mliss Sax, MD  traMADol (ULTRAM) 50 MG tablet Take 1 tablet (50 mg total) by mouth 3 (three) times daily as needed for up to 3 days for moderate pain. 05/14/23 05/17/23 Yes Kenneith Stief, Linde Gillis, NP  traZODone (DESYREL) 100 MG tablet Take 100 mg by mouth at bedtime.   Yes [provider]    Family History Family History  Problem Relation Age of Onset   Schizophrenia Father    Schizophrenia Cousin    Hyperthyroidism Mother    Hypothyroidism Maternal Grandmother     Social History Social History   Tobacco Use   Smoking status: Former    Current packs/day: 0.00    Average packs/day: 1 pack/day for 15.0 years (15.0 ttl pk-yrs)    Types: Cigarettes    Start date: 10/04/2006    Quit date: 10/04/2021    Years since quitting: 1.6   Smokeless tobacco: Former   Tobacco comments:    use vapor, social  Vaping Use   Vaping status: Never Used  Substance Use Topics   Alcohol use: Yes    Comment: occ   Drug use: No     Allergies   Pregabalin, Bupropion hcl, Celecoxib, Effexor [venlafaxine hcl], Oxybutynin, Pramipexole, Pramipexole dihydrochloride, Pregabalin, Prozac [fluoxetine hcl], Symbyax [olanzapine-fluoxetine hcl], Venlafaxine, and Desvenlafaxine   Review of Systems Review of Systems  HENT:   Positive for dental problem and facial swelling.      Physical Exam Triage Vital  Signs ED Triage Vitals [05/14/23 1526]  Encounter Vitals Group     BP (!) 144/64     Systolic BP Percentile      Diastolic BP Percentile      Pulse Rate 66     Resp 16     Temp 98.4 F (36.9 C)     Temp Source Oral     SpO2 99 %     Weight      Height      Head Circumference      Peak Flow      Pain Score      Pain Loc      Pain Education      Exclude from Growth Chart    No data found.  Updated Vital Signs BP (!) 144/64 (BP Location: Left Arm)   Pulse 66   Temp 98.4 F (36.9 C) (Oral)   Resp 16   SpO2 99%   Visual Acuity Right Eye Distance:   Left Eye Distance:   Bilateral Distance:    Right Eye Near:   Left Eye Near:    Bilateral Near:     Physical Exam Vitals and nursing note reviewed.  Constitutional:      General: She is in acute distress.  HENT:     Head: Normocephalic.     Comments: Swelling of the left lower side of the face and jaw.    Mouth/Throat:     Mouth: Mucous membranes are moist.     Comments: Left upper first molar broken at gumline.  Swelling and erythema noted. Neurological:     Mental Status: She is alert.      UC Treatments / Results  Labs (all labs ordered are listed, but only abnormal results are displayed) Labs Reviewed - No data to display  EKG   Radiology No results found.  Procedures Procedures (including critical care time)  Medications Ordered in UC Medications  lidocaine (XYLOCAINE) 2 % viscous mouth solution 15 mL (15 mLs Mouth/Throat Given 05/14/23 1543)    Initial Impression / Assessment and Plan / UC Course  I have reviewed the triage vital signs and the nursing notes.  Pertinent labs & imaging results that were available during my care of the patient were reviewed by me and considered in my medical decision making (see chart for details).  Patient has a dental abscess.  Broken tooth, first molar left upper, that  occurred last month.  She has not been able to see a dentist.  She is currently taking 800 mg every ibuprofen 3 times a day at home along with topical gel..  Ibuprofen has not helped the pain.  Swelling is gotten worse over the last 4 days.  Take medications as ordered.   Antibiotic to be ordered.  Swelling of the left cheek is concerning for involvement of the sinus area. Patient will plan to see dentist this week,  For tooth extraction. Given her viscous lidocaine to swish and spit as she is diabetic and has been unable to eat due to the pain.  Tramadol ordered for short course.  Warm compresses should be used for 20 minutes at a time multiple times a day.  She is advised to continue the Motrin 800 as needed once the acute pain is relieved.  Salt water gargles 3 times a day.   Patient also has a history of yeast infection infection secondary to antibiotic use.  We will go ahead and prophylactically order Diflucan x 1  PMP has  been checked  risk score is 350.  She has received 1 narcotic prescription over the last 12 months.   Final Clinical Impressions(s) / UC Diagnoses   Final diagnoses:  Dental abscess  Broken tooth with complication, open, initial encounter     Discharge Instructions      Take medications as ordered.  Complete all antibiotics.  Follow-up with dentist for broken tooth.  Consider salt water gargles 3 times a day. Use viscous lidocaine and area of broken tooth. Take Motrin 800 mg 3 times a day which you already have at home for the pain.  However tramadol has been ordered for pain that is not controlled with the Motrin. Warm compresses as needed.    ED Prescriptions     Medication Sig Dispense Auth. Provider   lidocaine (XYLOCAINE) 2 % solution Use as directed 15 mLs in the mouth or throat every 3 (three) hours as needed for mouth pain. Swish and spit. 100 mL Danyia Borunda, Linde Gillis, NP   amoxicillin-clavulanate (AUGMENTIN) 875-125 MG tablet Take 1 tablet by mouth every 12  (twelve) hours for 5 days. 10 tablet Khayden Herzberg, Linde Gillis, NP   traMADol (ULTRAM) 50 MG tablet Take 1 tablet (50 mg total) by mouth 3 (three) times daily as needed for up to 3 days for moderate pain. 9 tablet Alexandre Lightsey, Linde Gillis, NP   fluconazole (DIFLUCAN) 150 MG tablet Take 1 tablet (150 mg total) by mouth once for 1 dose. 1 tablet Haden Cavenaugh, Linde Gillis, NP      PDMP not reviewed this encounter.   Nelda Marseille, NP 05/14/23 1605    Nelda Marseille, NP 05/14/23 1613    Nelda Marseille, NP 05/14/23 [phone]

## 2023-05-15 ENCOUNTER — Ambulatory Visit (INDEPENDENT_AMBULATORY_CARE_PROVIDER_SITE_OTHER): Payer: PPO | Admitting: Family Medicine

## 2023-05-15 ENCOUNTER — Encounter: Payer: Self-pay | Admitting: Family Medicine

## 2023-05-15 VITALS — BP 124/70 | HR 55 | Temp 98.5°F | Resp 18 | Ht 64.0 in | Wt 150.8 lb

## 2023-05-15 DIAGNOSIS — L0291 Cutaneous abscess, unspecified: Secondary | ICD-10-CM

## 2023-05-15 DIAGNOSIS — K047 Periapical abscess without sinus: Secondary | ICD-10-CM

## 2023-05-15 MED ORDER — HYDROCODONE-ACETAMINOPHEN 5-325 MG PO TABS: 1.0000 | ORAL_TABLET | Freq: Four times a day (QID) | ORAL | 0 refills | Status: DC | PRN

## 2023-05-15 MED ORDER — KETOROLAC TROMETHAMINE 60 MG/2ML IM SOLN
60.0000 mg | Freq: Once | INTRAMUSCULAR | Status: AC
Start: 2023-05-15 — End: 2023-05-15
  Administered 2023-05-15: 60 mg via INTRAMUSCULAR

## 2023-05-15 NOTE — Progress Notes (Signed)
Established Patient Office Visit   Subjective:  Patient ID: [name], female    DOB: 02-19-1971  Age: 52 y.o. MRN: [id number]  Chief Complaint  Patient presents with   Abscess    Onset 05/12/2023    Abscess Pertinent negatives include no abdominal pain, myalgias, rash or weakness.   Encounter Diagnoses  Name Primary?   Abscess Yes   Dental abscess    Diagnosed with a dental abscess and seen in the emergency room yesterday.  She was prescribed Augmentin, lidocaine washes, Ultram and ibuprofen.  Ultram is not helpful.   Review of Systems  Constitutional: Negative.   HENT: Negative.    Eyes:  Negative for blurred vision, discharge and redness.  Respiratory: Negative.    Cardiovascular: Negative.   Gastrointestinal:  Negative for abdominal pain.  Genitourinary: Negative.   Musculoskeletal: Negative.  Negative for myalgias.  Skin:  Negative for rash.  Neurological:  Negative for tingling, loss of consciousness and weakness.  Endo/Heme/Allergies:  Negative for polydipsia.     Current Outpatient Medications:    ALPRAZolam (XANAX) 0.5 MG tablet, Take 0.5 mg by mouth 3 (three) times daily as needed for anxiety., Disp: , Rfl:    amoxicillin-clavulanate (AUGMENTIN) 875-125 MG tablet, Take 1 tablet by mouth every 12 (twelve) hours for 5 days., Disp: 10 tablet, Rfl: 0   ARIPiprazole (ABILIFY) 5 MG tablet, Take 1 tablet (5 mg total) by mouth daily., Disp: 30 tablet, Rfl: 2   aspirin-acetaminophen-caffeine (EXCEDRIN MIGRAINE) 250-250-65 MG tablet, Take 1 tablet by mouth every 6 (six) hours as needed for migraine., Disp: , Rfl:    atorvastatin (LIPITOR) 20 MG tablet, TAKE 1 TABLET(20 MG) BY MOUTH DAILY, Disp: 100 tablet, Rfl: 3   blood glucose meter kit and supplies KIT, Dispense based on patient and insurance preference. Check glucose before breakfast. ICD: E11.65, Disp: 1 each, Rfl: 0   diclofenac Sodium (VOLTAREN) 1 % GEL, Apply 2 g topically daily as needed., Disp: 150 g, Rfl:  1   FINACEA 15 % FOAM, Apply 1 application  topically 2 (two) times daily as needed (rosacea)., Disp: , Rfl:    gabapentin (NEURONTIN) 300 MG capsule, TAKE ONE CAPSULE BY MOUTH THREE TIMES A DAY, Disp: 90 capsule, Rfl: 1   glucose blood test strip, Use as instructed, check glucose before breakfast. ICD: E11.65, Disp: 100 each, Rfl: 12   HYDROcodone-acetaminophen (NORCO) 5-325 MG tablet, Take 1 tablet by mouth every 6 (six) hours as needed for moderate pain., Disp: 12 tablet, Rfl: 0   lamoTRIgine (LAMICTAL) 150 MG tablet, Take 300 mg by mouth daily., Disp: , Rfl:    Lancets (FREESTYLE) lancets, Use as instructed. E11.65, Disp: 100 each, Rfl: 12   levothyroxine (SYNTHROID) 75 MCG tablet, Take 1 tablet (75 mcg total) by mouth daily before breakfast. TAKE 1 TABLET(75 MCG) BY MOUTH DAILY BEFORE BREAKFAST Strength: 75 mcg, Disp: 90 tablet, Rfl: 2   lidocaine (XYLOCAINE) 2 % solution, Use as directed 15 mLs in the mouth or throat every 3 (three) hours as needed for mouth pain. Swish and spit., Disp: 100 mL, Rfl: 0   metFORMIN (GLUCOPHAGE) 500 MG tablet, Take 1 tablet (500 mg total) by mouth 2 (two) times daily with a meal., Disp: 180 tablet, Rfl: 3   methylphenidate (RITALIN) 20 MG tablet, Take 20 mg by mouth 2 (two) times daily., Disp: , Rfl:    pantoprazole (PROTONIX) 40 MG tablet, Take 1 tablet (40 mg total) by mouth daily., Disp: 90 tablet, Rfl: 1  polyethylene glycol powder (GLYCOLAX/MIRALAX) 17 GM/SCOOP powder, Take 17 g by mouth daily., Disp: 850 g, Rfl: 3   Semaglutide,0.25 or 0.5MG /DOS, (OZEMPIC, 0.25 OR 0.5 MG/DOSE,) 2 MG/3ML SOPN, Inject 0.25 mg into the skin once a week., Disp: 3 mL, Rfl: 2   traZODone (DESYREL) 100 MG tablet, Take 100 mg by mouth at bedtime., Disp: , Rfl:   Current Facility-Administered Medications:    ketorolac (TORADOL) injection 60 mg, 60 mg, Intramuscular, Once,    Objective:     BP 124/70 (BP Location: Left Arm, Patient Position: Sitting, Cuff Size: Normal)   Pulse  (!) 55   Temp 98.5 F (36.9 C) (Temporal)   Resp 18   Ht 5\' 4"  (1.626 m)   Wt 150 lb 12.8 oz (68.4 kg)   SpO2 98%   BMI 25.88 kg/m    Physical Exam Constitutional:      General: She is not in acute distress.    Appearance: Normal appearance. She is not ill-appearing, toxic-appearing or diaphoretic.  HENT:     Head: Normocephalic and atraumatic.     Right Ear: External ear normal.     Left Ear: External ear normal.     Mouth/Throat:   Eyes:     General: No scleral icterus.       Right eye: No discharge.        Left eye: No discharge.     Extraocular Movements: Extraocular movements intact.     Conjunctiva/sclera: Conjunctivae normal.  Pulmonary:     Effort: Pulmonary effort is normal. No respiratory distress.  Skin:    General: Skin is warm and dry.  Neurological:     Mental Status: She is alert and oriented to person, place, and time.  Psychiatric:        Mood and Affect: Mood normal.        Behavior: Behavior normal.      No results found for any visits on 05/15/23.    The 10-year ASCVD risk score (Arnett DK, et al., 2019) is: 5.9%    Assessment & Plan:   Abscess  Dental abscess -     Ketorolac Tromethamine -     HYDROcodone-Acetaminophen; Take 1 tablet by mouth every 6 (six) hours as needed for moderate pain.  Dispense: 12 tablet; Refill: 0    Return Patient advised to return to the emergency room or see her dentist for drainage of abscess..  Toradol 60 and a short course of Norco for pain.  Advised to see her dentist or proceed to the emergency room for drainage.  Mliss Sax, MD

## 2023-06-01 ENCOUNTER — Ambulatory Visit: Payer: Self-pay

## 2023-06-01 NOTE — Patient Outreach (Signed)
Care Coordination   Follow Up Visit Note   06/01/2023 Name: [name] MRN: [id number] DOB: 1970/09/19  [name] is a 52 y.o. year old female who sees Mliss Sax, MD for primary care. I spoke with  [name] by phone today.  What matters to the patients health and wellness today?  Life stresses- getting into therapy    Goals Addressed             This Visit's Progress    Diabetes Management       Patient Goals/Self Care Activities: -Patient/Caregiver will self-administer medications as prescribed as evidenced by self-report/primary caregiver report  -Patient/Caregiver will attend all scheduled provider appointments as evidenced by clinician review of documented attendance to scheduled appointments and patient/caregiver report -Patient/Caregiver will call provider office for new concerns or questions as evidenced by review of documented incoming telephone call notes and patient report  -check blood sugar at prescribed times -check blood sugar if I feel it is too high or too low -record values and write them down take them to all doctor visits    Patient spoke with patient, she is dealing with life stresses.  Some financial strain due to losing daughter social security benefit due to her turning 45.  She does not have a mortgage as he ex pays that However, she pays utilities, HOA dues and some other bills.  She utilizes food banks to supplement with food.  She states she is given mostly carbs, which run her sugar up.  Most recent blood sugar running in the 130's.  She continues the ozempic.  She reports she is seeking out therapy to deal with life stresses.  She has called [facility] and waiting for information.  Discussed talking with LCSW to bridge her to counseling. She is agreeable.  Appointment set with LCSW on 06/12/23 at 1:00 pm.  Diabetes management discussed.           SDOH assessments and interventions completed:  Yes  SDOH Interventions  Today    Flowsheet Row Most Recent Value  SDOH Interventions   Depression Interventions/Treatment  Medication        Care Coordination Interventions:  Yes, provided   Follow up plan: Follow up call scheduled for October    Encounter Outcome:  Patient Visit Completed   Bary Leriche, RN, MSN [place]  [facility] Management Community Coordinator Direct Dial: [phone]  Fax: [phone] Website: [email or web address]

## 2023-06-01 NOTE — Patient Instructions (Signed)
Visit Information  Thank you for taking time to visit with me today. Please don't hesitate to contact me if I can be of assistance to you.   Following are the goals we discussed today:   Goals Addressed             This Visit's Progress    Diabetes Management       Patient Goals/Self Care Activities: -Patient/Caregiver will self-administer medications as prescribed as evidenced by self-report/primary caregiver report  -Patient/Caregiver will attend all scheduled provider appointments as evidenced by clinician review of documented attendance to scheduled appointments and patient/caregiver report -Patient/Caregiver will call provider office for new concerns or questions as evidenced by review of documented incoming telephone call notes and patient report  -check blood sugar at prescribed times -check blood sugar if I feel it is too high or too low -record values and write them down take them to all doctor visits    Patient spoke with patient, she is dealing with life stresses.  Some financial strain due to losing daughter social security benefit due to her turning 69.  She does not have a mortgage as he ex pays that However, she pays utilities, HOA dues and some other bills.  She utilizes food banks to supplement with food.  She states she is given mostly carbs, which run her sugar up.  Most recent blood sugar running in the 130's.  She continues the ozempic.  She reports she is seeking out therapy to deal with life stresses.  She has called [facility] and waiting for information.  Discussed talking with LCSW to bridge her to counseling. She is agreeable.  Appointment set with LCSW on 06/12/23 at 1:00 pm.  Diabetes management discussed.           Our next appointment is by telephone on 06/26/23 at 1200 pm  Please call the care guide team at [phone] if you need to cancel or reschedule your appointment.   If you are experiencing a Mental Health or Behavioral Health Crisis or  need someone to talk to, please call the Suicide and Crisis Lifeline: 988   Patient verbalizes understanding of instructions and care plan provided today and agrees to view in MyChart. Active MyChart status and patient understanding of how to access instructions and care plan via MyChart confirmed with patient.     The patient has been provided with contact information for the care management team and has been advised to call with any health related questions or concerns.   Bary Leriche, RN, MSN [facility], [facility] Management Community Coordinator Direct Dial: [phone]  Fax: [phone] Website: [email or web address]

## 2023-06-12 ENCOUNTER — Other Ambulatory Visit: Payer: Self-pay | Admitting: Family Medicine

## 2023-06-12 ENCOUNTER — Ambulatory Visit: Payer: Self-pay | Admitting: *Deleted

## 2023-06-12 NOTE — Patient Instructions (Signed)
Visit Information  Thank you for taking time to visit with me today. Please don't hesitate to contact me if I can be of assistance to you.   Following are the goals we discussed today:   Goals Addressed               This Visit's Progress     Improve mental health and overall quality of life (pt-stated)        Activities and task to complete in order to accomplish goals.   Review and apply online or go to Department of Social Services (Medicaid, Sales executive, emergency assist) via [email or web address] Call your insurance provider for more information about your Enhanced Benefits (Behavioral Health Navigator ) Continue to work with "ticket to work" program for job search Review material being emailed to you : [employer], DSS, etc Follow up on food resources [employer] (select "find help") - Find Food  [place], [place] (ghpfa.org) or download the free app to your smart phone Food and Nutrition Services - 445-405-0011 or apply online at [facility] - ePASS Begin a journal as discussed. Be aware of your mood, feelings, changes,etc and journal this too Start / continue relaxed breathing 3 times daily Keep all upcoming appointment discussed today Continue with compliance of taking medication prescribed by Doctor Self Support options  (enjoy time with family, boyfriend and your upcoming trip) Review your EMMI educational information Look for an e-mail from [employer]. Call "988" for urgent mental health support/crisis line support available 24/7 Call :911" for urgent/emergent needs        Our next appointment is by telephone on 07/03/23 11am  Please call the care guide team at [phone] if you need to cancel or reschedule your appointment.   If you are experiencing a Mental Health or Behavioral Health Crisis or need someone to talk to, please call the Suicide and Crisis Lifeline: 988 call 911   The patient verbalized understanding of  instructions, educational materials, and care plan provided today and DECLINED offer to receive copy of patient instructions, educational materials, and care plan.   Telephone follow up appointment with care management team member scheduled for:07/03/23  Reece Levy, MSW, LCSW Clinical Social Worker [phone]

## 2023-06-12 NOTE — Patient Outreach (Signed)
Care Coordination   Initial Visit Note   06/12/2023 Name: [name] MRN: [id number] DOB: 06-13-1971  [name] is a 52 y.o. year old female who sees Mliss Sax, MD for primary care. I spoke with  [name] by phone today.  What matters to the patients health and wellness today?  Need to get into see a Counselor.     Goals Addressed               This Visit's Progress     Improve mental health and overall quality of life (pt-stated)        Activities and task to complete in order to accomplish goals.   Review and apply online or go to Department of Social Services (Medicaid, Sales executive, emergency assist) via EPASS.GOV Call your insurance provider for more information about your Enhanced Benefits (Behavioral Health Navigator ) Continue to work with "ticket to work" program for job search Review material being emailed to you : [employer], DSS, etc Follow up on food resources [employer] (select "find help") - Find Food  [place], [place] (ghpfa.org) or download the free app to your smart phone Food and Nutrition Services - 336-175-3751 or apply online at [facility] - ePASS Begin a journal as discussed. Be aware of your mood, feelings, changes,etc and journal this too Start / continue relaxed breathing 3 times daily Keep all upcoming appointment discussed today Continue with compliance of taking medication prescribed by Doctor Self Support options  (enjoy time with family, boyfriend and your upcoming trip) Review your EMMI educational information Look for an e-mail from [employer]. Call "988" for urgent mental health support/crisis line support available 24/7 Call :911" for urgent/emergent needs        SDOH assessments and interventions completed:  Yes  SDOH Interventions Today    Flowsheet Row Most Recent Value  SDOH Interventions   Financial Strain Interventions Other (Comment)        Care  Coordination Interventions:  Yes, provided  , Interventions Today    Flowsheet Row Most Recent Value  Chronic Disease   Chronic disease during today's visit Other  [depression/anxiety]  General Interventions   General Interventions Discussed/Reviewed General Interventions Discussed, General Interventions Reviewed, [employer], Doctor Visits  [Discussed communnity resources ([employer], DSS programs, etc and will email material to pt to review for  consideration and to apply)]  Doctor Visits Discussed/Reviewed Specialist  [Pt has a Therapist, sports Dr Everlene Balls at [facility] that she plans to discuss RX adjustments/changes with for better management of her symptoms of depression/anxiety.]  PCP/Specialist Visits Compliance with follow-up visit, Contact provider for referral to  [Pt to inquire with her Psychiatrist about possible Counselors they may recommend or have in their practice while also reviewing insurance covered providers.]  Contacted provider for referral to Specialist  Education Interventions   Education Provided Provided Web-based Education, Provided Education  [CSW will send pt material on depression. anxiety/worry along with other supportive resources]  Provided Verbal Education On Mental Health/Coping with Illness, Medication, [employer], [employer], Nutrition  [CSW to send pt a link for applying for Medicaid, Food Stamps and other emergency assistance. Pt wants to ask Psychiatrist about adjustment/changes to her mental health meds to better manage symptoms- states she has been on the current RX regimen longtime]  Mental Health Interventions   Mental Health Discussed/Reviewed Mental Health Discussed, Mental Health Reviewed, Coping Strategies, Crisis, Anxiety, Depression, Other  [Pt  reports symptoms of depression,anxiety and PTSD and wants to pursue Counseling. Pt encouraged to journal and watch for triggers (worry/family/ etc). Pt plans to ask her Psychiatrist  about possible counselor referral in-house or other]  Pharmacy Interventions   Pharmacy Dicussed/Reviewed Pharmacy Topics Discussed  [Pt reports taking Xanax, Abilify and Lamictal for mental health support]  Safety Interventions   Safety Discussed/Reviewed Safety Discussed  [Pt advised of local [facility]/Urgent Care and the "988" line to call for urgent needs 24/7]       Follow up plan: Follow up call scheduled for 07/03/23    Encounter Outcome:  Patient Visit Completed

## 2023-06-16 ENCOUNTER — Ambulatory Visit: Payer: PPO | Admitting: Family Medicine

## 2023-06-16 ENCOUNTER — Other Ambulatory Visit: Payer: Self-pay | Admitting: Family Medicine

## 2023-06-16 ENCOUNTER — Encounter: Payer: Self-pay | Admitting: Family Medicine

## 2023-06-16 VITALS — BP 96/58 | HR 67 | Temp 98.5°F | Ht 64.0 in | Wt 151.8 lb

## 2023-06-16 DIAGNOSIS — I959 Hypotension, unspecified: Secondary | ICD-10-CM

## 2023-06-16 DIAGNOSIS — G8929 Other chronic pain: Secondary | ICD-10-CM

## 2023-06-16 DIAGNOSIS — G2581 Restless legs syndrome: Secondary | ICD-10-CM

## 2023-06-16 DIAGNOSIS — K047 Periapical abscess without sinus: Secondary | ICD-10-CM

## 2023-06-16 DIAGNOSIS — R7303 Prediabetes: Secondary | ICD-10-CM | POA: Diagnosis not present

## 2023-06-16 DIAGNOSIS — E860 Dehydration: Secondary | ICD-10-CM | POA: Diagnosis not present

## 2023-06-16 LAB — BASIC METABOLIC PANEL
BUN: 20 mg/dL (ref 6–23)
CO2: 30 meq/L (ref 19–32)
Calcium: 9.9 mg/dL (ref 8.4–10.5)
Chloride: 102 meq/L (ref 96–112)
Creatinine, Ser: 0.68 mg/dL (ref 0.40–1.20)
GFR: 101.5 mL/min (ref >60.00)
Glucose, Bld: 106 mg/dL — ABNORMAL HIGH (ref 70–99)
Potassium: 4 meq/L (ref 3.5–5.1)
Sodium: 141 meq/L (ref 135–145)

## 2023-06-16 LAB — URINALYSIS, ROUTINE W REFLEX MICROSCOPIC
Bilirubin Urine: NEGATIVE
Hgb urine dipstick: NEGATIVE
Ketones, ur: NEGATIVE
Leukocytes,Ua: NEGATIVE
Nitrite: NEGATIVE
Specific Gravity, Urine: 1.02 (ref 1.000–1.030)
Total Protein, Urine: NEGATIVE
Urine Glucose: NEGATIVE
Urobilinogen, UA: 0.2 (ref 0.0–1.0)
pH: 7 (ref 5.0–8.0)

## 2023-06-16 MED ORDER — AMOXICILLIN 875 MG PO TABS
875.0000 mg | ORAL_TABLET | Freq: Two times a day (BID) | ORAL | 0 refills | Status: AC
Start: 2023-06-16 — End: 2023-06-21

## 2023-06-16 NOTE — Progress Notes (Signed)
Established Patient Office Visit   Subjective:  Patient ID: [name], female    DOB: [birth year]  Age: 52 y.o. MRN: [id number]  Chief Complaint  Patient presents with   Dental Pain    Believes an abscess is present. White area on the tooth/gum when she looks in he rmouth.    Dental Pain    Encounter Diagnoses  Name Primary?   Dental abscess Yes   Dehydration    Prediabetes    Hypotension, unspecified hypotension type    There is erythema and mild tenderness around the left frontal lower teeth.  Has been feeling weak and craving carbs.    Review of Systems  Constitutional: Negative.   HENT: Negative.    Eyes:  Negative for blurred vision, discharge and redness.  Respiratory: Negative.    Cardiovascular: Negative.   Gastrointestinal:  Negative for abdominal pain.  Genitourinary: Negative.   Musculoskeletal: Negative.  Negative for myalgias.  Skin:  Negative for rash.  Neurological:  Negative for tingling, loss of consciousness and weakness.  Endo/Heme/Allergies:  Negative for polydipsia.     Current Outpatient Medications:    ALPRAZolam (XANAX) 0.5 MG tablet, Take 0.5 mg by mouth 3 (three) times daily as needed for anxiety., Disp: , Rfl:    amoxicillin (AMOXIL) 875 MG tablet, Take 1 tablet (875 mg total) by mouth 2 (two) times daily for 5 days., Disp: 10 tablet, Rfl: 0   ARIPiprazole (ABILIFY) 5 MG tablet, Take 1 tablet (5 mg total) by mouth daily., Disp: 30 tablet, Rfl: 2   aspirin-acetaminophen-caffeine (EXCEDRIN MIGRAINE) 250-250-65 MG tablet, Take 1 tablet by mouth every 6 (six) hours as needed for migraine., Disp: , Rfl:    atorvastatin (LIPITOR) 20 MG tablet, TAKE 1 TABLET(20 MG) BY MOUTH DAILY, Disp: 100 tablet, Rfl: 3   blood glucose meter kit and supplies KIT, Dispense based on patient and insurance preference. Check glucose before breakfast. ICD: E11.65, Disp: 1 each, Rfl: 0   diclofenac Sodium (VOLTAREN) 1 % GEL, APPLY TWO GRAMS TOPICALLY DAILY AS NEEDED,  Disp: 200 g, Rfl: 0   FINACEA 15 % FOAM, Apply 1 application  topically 2 (two) times daily as needed (rosacea)., Disp: , Rfl:    gabapentin (NEURONTIN) 300 MG capsule, TAKE ONE CAPSULE BY MOUTH THREE TIMES A DAY, Disp: 90 capsule, Rfl: 1   glucose blood test strip, Use as instructed, check glucose before breakfast. ICD: E11.65, Disp: 100 each, Rfl: 12   lamoTRIgine (LAMICTAL) 150 MG tablet, Take 300 mg by mouth daily., Disp: , Rfl:    Lancets (FREESTYLE) lancets, Use as instructed. E11.65, Disp: 100 each, Rfl: 12   levothyroxine (SYNTHROID) 75 MCG tablet, Take 1 tablet (75 mcg total) by mouth daily before breakfast. TAKE 1 TABLET(75 MCG) BY MOUTH DAILY BEFORE BREAKFAST Strength: 75 mcg, Disp: 90 tablet, Rfl: 2   metFORMIN (GLUCOPHAGE) 500 MG tablet, Take 1 tablet (500 mg total) by mouth 2 (two) times daily with a meal., Disp: 180 tablet, Rfl: 3   methylphenidate (RITALIN) 20 MG tablet, Take 20 mg by mouth 2 (two) times daily., Disp: , Rfl:    pantoprazole (PROTONIX) 40 MG tablet, Take 1 tablet (40 mg total) by mouth daily., Disp: 90 tablet, Rfl: 1   polyethylene glycol powder (GLYCOLAX/MIRALAX) 17 GM/SCOOP powder, Take 17 g by mouth daily., Disp: 850 g, Rfl: 3   traZODone (DESYREL) 100 MG tablet, Take 100 mg by mouth at bedtime., Disp: , Rfl:    lidocaine (XYLOCAINE) 2 % solution,  Use as directed 15 mLs in the mouth or throat every 3 (three) hours as needed for mouth pain. Swish and spit. (Patient not taking: Reported on 06/16/2023), Disp: 100 mL, Rfl: 0   Objective:     BP (!) 96/58 (BP Location: Left Arm, Patient Position: Sitting)   Pulse 67   Temp 98.5 F (36.9 C) (Temporal)   Ht 5\' 4"  (1.626 m)   Wt 151 lb 12.8 oz (68.9 kg)   LMP 04/11/2023 (Approximate)   SpO2 100%   BMI 26.06 kg/m    Physical Exam Constitutional:      General: She is not in acute distress.    Appearance: Normal appearance. She is not ill-appearing, toxic-appearing or diaphoretic.  HENT:     Head:  Normocephalic and atraumatic.     Right Ear: External ear normal.     Left Ear: External ear normal.     Mouth/Throat:   Eyes:     General: No scleral icterus.       Right eye: No discharge.        Left eye: No discharge.     Extraocular Movements: Extraocular movements intact.     Conjunctiva/sclera: Conjunctivae normal.  Pulmonary:     Effort: Pulmonary effort is normal. No respiratory distress.  Skin:    General: Skin is warm and dry.  Neurological:     Mental Status: She is alert and oriented to person, place, and time.  Psychiatric:        Mood and Affect: Mood normal.        Behavior: Behavior normal.      No results found for any visits on 06/16/23.    The 10-year ASCVD risk score (Arnett DK, et al., 2019) is: 3.6%    Assessment & Plan:   Dental abscess -     Amoxicillin; Take 1 tablet (875 mg total) by mouth 2 (two) times daily for 5 days.  Dispense: 10 tablet; Refill: 0  Dehydration  Prediabetes  Hypotension, unspecified hypotension type -     Urinalysis, Routine w reflex microscopic -     Basic metabolic panel    Return in about 8 weeks (around 08/11/2023).  Patient is no longer a diabetic.  She does not need to lose more weight.  Have discontinued semaglutide for now.  May be able to discontinue metformin as well.  Encouraged adequate hydration.  I understand that she does her dentist Monday.  She will need to see them for further care of her teeth.  Mliss Sax, MD

## 2023-06-20 ENCOUNTER — Telehealth: Payer: Self-pay | Admitting: Student-PharmD

## 2023-06-20 NOTE — Progress Notes (Signed)
This patient is appearing on a report for being at risk of failing the adherence measure for hypertension (ACEi/ARB) medications this calendar year.   Medication: Lisinopril 20mg  Last fill date: 10/22/2022 for 90 day supply  Patient seen by PCP 4 days ago and found to be hypotensive at 96/58. Patient stopped taking med months ago as BP's in office were very well controlled while off lisinopril.     Pt also picked up Lisinopril 10mg  Last fill date: 12/08/2022 for 90 day supply, but still saw normal blood pressures when not taking at office visits in late summer and fall.  Med not needed for BP lowering at this time due to levels being normotensive according to AHA/ACC Blood pressure guidelines.  Sharee Pimple

## 2023-06-26 ENCOUNTER — Ambulatory Visit: Payer: Self-pay

## 2023-06-26 ENCOUNTER — Ambulatory Visit: Payer: Self-pay | Admitting: *Deleted

## 2023-06-26 NOTE — Patient Outreach (Signed)
Care Coordination   Follow Up Visit Note   06/26/2023 Name: PAULANN DELUCCA MRN: [id number] DOB: September 07, 1970  Purva L Uhde is a 52 y.o. year old female who sees Mliss Sax, MD for primary care. I spoke with  Elleigh L Laffey by phone today.  What matters to the patients health and wellness today?  Health management    Goals Addressed             This Visit's Progress    Diabetes Management       Patient Goals/Self Care Activities: -Patient/Caregiver will self-administer medications as prescribed as evidenced by self-report/primary caregiver report  -Patient/Caregiver will attend all scheduled provider appointments as evidenced by clinician review of documented attendance to scheduled appointments and patient/caregiver report -Patient/Caregiver will call provider office for new concerns or questions as evidenced by review of documented incoming telephone call notes and patient report  -check blood sugar at prescribed times -check blood sugar if I feel it is too high or too low -record values and write them down take them to all doctor visits    Patient spoke with patient, she is dealing with life stresses.  She is connected with LCSW, Marylu Lund for help with counseling.  She is currently looking for a job as she needs extra income. She reports she is now off the ozempic as she was losing too much weight.  Taking metformin.  Blood sugar this am was 111.  Discussed diabetes management and importance.  She verbalized understanding.  No concerns.          SDOH assessments and interventions completed:  Yes     Care Coordination Interventions:  Yes, provided   Follow up plan: Follow up call scheduled for November    Encounter Outcome:  Patient Visit Completed {THN Tip this will not be part of the note when signed-REQUIRED REPORT FIELD DO NOT DELETE (Optional):[id number]  Michelene Keniston Idelle Jo, RN, MSN [facility] Health  [facility], [facility] Management  Community Coordinator Direct Dial: [phone]  Fax: [phone] Website: [email or web address]

## 2023-06-26 NOTE — Patient Outreach (Signed)
Care Coordination   Follow Up Visit Note   06/26/2023 Name: CADYNCE WOLLMAN MRN: [id number] DOB: 06/06/1971  Melynda L Carusone is a 52 y.o. year old female who sees Mliss Sax, MD for primary care. I spoke with  Taylar L Peery by phone today.  What matters to the patients health and wellness today?  Seeking counseling/therapy support.     Goals Addressed               This Visit's Progress     Improve mental health and overall quality of life (pt-stated)        Activities and task to complete in order to accomplish goals.   Discuss with Dr Tressie Ellis your medications and counselor options there as discussed Review and apply online or go to Department of Social Services (Medicaid, Sales executive, emergency assist) via EPASS.GOV Call your insurance provider for more information about your Enhanced Benefits (Behavioral Health Navigator ) Continue to work with "ticket to work" program for job search Review material being emailed to you : [employer], DSS, etc Follow up on food resources [employer] (select "find help") - Find Food  [place], [place] (ghpfa.org) or download the free app to your smart phone Food and Nutrition Services - 806-572-9644 or apply online at [facility] - ePASS Begin a journal as discussed. Be aware of your mood, feelings, changes,etc and journal this too Start / continue relaxed breathing 3 times daily Keep all upcoming appointment discussed today Continue with compliance of taking medication prescribed by Doctor Self Support options  (enjoy time with family, boyfriend and your upcoming trip) Review your EMMI educational information Look for an e-mail from [employer]. Call "988" for urgent mental health support/crisis line support available 24/7 Call :911" for urgent/emergent needs        SDOH assessments and interventions completed:  Yes     Care Coordination Interventions:  Yes, provided   Interventions Today    Flowsheet Row Most Recent Value  Chronic Disease   Chronic disease during today's visit Other  [depression/anxiety]  General Interventions   General Interventions Discussed/Reviewed Freight forwarder to pt which she has not seen- will resend and include in instructions here]  Doctor Visits Discussed/Reviewed Specialist  [Pt reports she had a tele visit with her Psychiatrist but they were running late and she did not get a chance to talk about seeking in-house counselor there. Plans to follow up on this as she is also planning a call to them to ask about RX questions]  PCP/Specialist Visits Contact provider for referral to  [Counselor at [facility] as indicated is your preference]  Education Interventions   Provided Verbal Education On Mental Health/Coping with Illness  [Pt has decided she is not comfortable writing in a journal due to concern someone will find it and read it. Suggested she type it into her phone with lock/pin # if that feels safe.]  Mental Health Interventions   Mental Health Discussed/Reviewed Mental Health Discussed, Mental Health Reviewed, Coping Strategies, Anxiety, Depression  [Pt feels things are about the same- her weekend trip ended up being cancelled. She is still wanting to pursue a counselor/therapist on site at the practice where she sees her Psychiatrist- plans to call and inquire about that as well as regarding RX's]       Follow up plan: Follow up call scheduled for 07/11/23    Encounter Outcome:  Patient Visit Completed

## 2023-06-26 NOTE — Patient Instructions (Signed)
Visit Information  Thank you for taking time to visit with me today. Please don't hesitate to contact me if I can be of assistance to you.   Following are the goals we discussed today:   Goals Addressed             This Visit's Progress    Diabetes Management       Patient Goals/Self Care Activities: -Patient/Caregiver will self-administer medications as prescribed as evidenced by self-report/primary caregiver report  -Patient/Caregiver will attend all scheduled provider appointments as evidenced by clinician review of documented attendance to scheduled appointments and patient/caregiver report -Patient/Caregiver will call provider office for new concerns or questions as evidenced by review of documented incoming telephone call notes and patient report  -check blood sugar at prescribed times -check blood sugar if I feel it is too high or too low -record values and write them down take them to all doctor visits    Patient spoke with patient, she is dealing with life stresses.  She is connected with LCSW, Marylu Lund for help with counseling.  She is currently looking for a job as she needs extra income. She reports she is now off the ozempic as she was losing too much weight.  Taking metformin.  Blood sugar this am was 111.  Discussed diabetes management and importance.  She verbalized understanding.  No concerns.          Our next appointment is by telephone on 07/31/23 at 1200 pm  Please call the care guide team at [phone] if you need to cancel or reschedule your appointment.   If you are experiencing a Mental Health or Behavioral Health Crisis or need someone to talk to, please call the Suicide and Crisis Lifeline: 988   Patient verbalizes understanding of instructions and care plan provided today and agrees to view in MyChart. Active MyChart status and patient understanding of how to access instructions and care plan via MyChart confirmed with patient.     The patient has been  provided with contact information for the care management team and has been advised to call with any health related questions or concerns.   Bary Leriche, RN, MSN [facility], [facility] Management Community Coordinator Direct Dial: [phone]  Fax: [phone] Website: [email or web address]

## 2023-06-26 NOTE — Patient Instructions (Signed)
Visit Information  Thank you for taking time to visit with me today. Please don't hesitate to contact me if I can be of assistance to you.   Following are the goals we discussed today:   Goals Addressed               This Visit's Progress     Improve mental health and overall quality of life (pt-stated)        Activities and task to complete in order to accomplish goals.   Discuss with Dr Tressie Ellis your medications and counselor options there as discussed Review and apply online or go to Department of Social Services (Medicaid, Sales executive, emergency assist) via EPASS.GOV Call your insurance provider for more information about your Enhanced Benefits (Behavioral Health Navigator ) Continue to work with "ticket to work" program for job search Review material being emailed to you : [employer], DSS, etc Follow up on food resources [employer] (select "find help") - Find Food  [place], [place] (ghpfa.org) or download the free app to your smart phone Food and Nutrition Services - 7733244770 or apply online at [facility] - ePASS Begin a journal as discussed. Be aware of your mood, feelings, changes,etc and journal this too Start / continue relaxed breathing 3 times daily Keep all upcoming appointment discussed today Continue with compliance of taking medication prescribed by Doctor Self Support options  (enjoy time with family, boyfriend and your upcoming trip) Review your EMMI educational information Look for an e-mail from [employer]. Call "988" for urgent mental health support/crisis line support available 24/7 Call :911" for urgent/emergent needs       DSS website for applying for Medicaid, Food Stamps, and other assistance:  Epass.gov  Voc Rehab- help with finding employment: Multimedia programmer and Employment  [place], [place] (PaintingEmporium.co.za)  Journaling/Depression: How to Manage Depression by Writing in a Journal  (DisposableNylon.be)  41 Journal Prompts for Depression (choosingtherapy.com)  Possible option for you and/or family:  [facility] (kellinfoundation.org)   Find a Paramedic, Warden/ranger, Counselor - Psychology Today  [email or web address]    Our next appointment is by telephone on 07/11/23  Please call the care guide team at [phone] if you need to cancel or reschedule your appointment.   If you are experiencing a Mental Health or Behavioral Health Crisis or need someone to talk to, please call the Suicide and Crisis Lifeline: 988 call 911   Patient verbalizes understanding of instructions and care plan provided today and agrees to view in MyChart. Active MyChart status and patient understanding of how to access instructions and care plan via MyChart confirmed with patient.     Telephone follow up appointment with care management team member scheduled for:07/11/23  Reece Levy, MSW, LCSW [facility]  [facility], [facility] Licensed Clinical Social Worker Care Coordinator  [phone]

## 2023-07-05 DIAGNOSIS — F411 Generalized anxiety disorder: Secondary | ICD-10-CM | POA: Diagnosis not present

## 2023-07-05 DIAGNOSIS — F429 Obsessive-compulsive disorder, unspecified: Secondary | ICD-10-CM | POA: Diagnosis not present

## 2023-07-05 DIAGNOSIS — F3132 Bipolar disorder, current episode depressed, moderate: Secondary | ICD-10-CM | POA: Diagnosis not present

## 2023-07-05 DIAGNOSIS — F909 Attention-deficit hyperactivity disorder, unspecified type: Secondary | ICD-10-CM | POA: Diagnosis not present

## 2023-07-11 ENCOUNTER — Ambulatory Visit: Payer: Self-pay | Admitting: *Deleted

## 2023-07-11 NOTE — Patient Outreach (Signed)
  Care Coordination   Follow Up Visit Note   07/11/2023 Name: [name] MRN: [id number] DOB: [birth year]  [name] is a 52 y.o. year old female who sees Claudia Sax, MD for primary care. I spoke with  [name] by phone today.  What matters to the patients health and wellness today?  Taken off Ozempic (too much weight loss).     Goals Addressed               This Visit's Progress     Improve mental health and overall quality of life (pt-stated)        Activities and task to complete in order to accomplish goals.   Continue with new RX started last week byDr Tressie Kim Ask [name] for counselor recommendations as discussed Review and apply online or go to Department of Social Services (Medicaid, [employer], emergency assist) via EPASS.GOV Call your insurance provider for more information about your Enhanced Benefits (Behavioral Health Navigator ) Continue to work with "ticket to work" program for job search Review material being emailed to you : [employer], DSS, etc (also #'s provided to you today)  Follow up on food resources [facility] (select "find help") - Find Food  [place], [place] (ghpfa.org) or download the free app to your smart phone Food and Nutrition Services - (539)470-0163 or apply online at [facility] - ePASS Begin a journal as discussed. Be aware of your mood, feelings, changes,etc and journal this too Start / continue relaxed breathing 3 times daily Keep all upcoming appointment discussed today Continue with compliance of taking medication prescribed by Doctor Self Support options  (enjoy time with family, boyfriend and your upcoming trip) Review your EMMI educational information Look for an e-mail from [employer]. Call "988" for urgent mental health support/crisis line support available 24/7 Call :911" for urgent/emergent needs        SDOH assessments and interventions completed:   Yes     Care Coordination Interventions:  Yes, provided  Interventions Today    Flowsheet Row Most Recent Value  Chronic Disease   Chronic disease during today's visit Other  [depression]  General Interventions   General Interventions Discussed/Reviewed General Interventions Discussed, General Interventions Reviewed, [employer]  [provided pt with contact #'s for resources discussed and emailed previously- DSS, Food, Voc Rehab, etc]  Doctor Visits Discussed/Reviewed Specialist  [Pt reports seeing her Psychiatrist last week and plans follow up in @1month . Pt was started on new RX (priorauth approved)]  Exercise Interventions   Exercise Discussed/Reviewed Physical Activity  Physical Activity Discussed/Reviewed Physical Activity Discussed  [Pt is considering a job at [employer]]  Education Interventions   Provided Verbal Education On Mental Health/Coping with Illness  Mental Health Interventions   Mental Health Discussed/Reviewed Mental Health Discussed, Mental Health Reviewed, Coping Strategies, Depression  [Pt previously had planned to ask Psychiatrist for counseling recommendations (at same office?)]  Safety Interventions   Safety Discussed/Reviewed Safety Discussed  [Reminded pt of "64" number to call for mental health crisis line counselor 24/7]       Follow up plan: Follow up call scheduled for 08/08/23    Encounter Outcome:  Patient Visit Completed

## 2023-07-11 NOTE — Patient Instructions (Signed)
Visit Information  Thank you for taking time to visit with me today. Please don't hesitate to contact me if I can be of assistance to you.   Following are the goals we discussed today:   Goals Addressed               This Visit's Progress     Improve mental health and overall quality of life (pt-stated)        Activities and task to complete in order to accomplish goals.   Continue with new RX started last week byDr Tressie Ellis Ask Dr Tressie Ellis for counselor recommendations as discussed Review and apply online or go to Department of Social Services (Medicaid, [employer], emergency assist) via EPASS.GOV Call your insurance provider for more information about your Enhanced Benefits (Behavioral Health Navigator ) Continue to work with "ticket to work" program for job search Review material being emailed to you : [employer], DSS, etc (also #'s provided to you today)  Follow up on food resources [facility] (select "find help") - Find Food  [place], [place] (ghpfa.org) or download the free app to your smart phone Food and Nutrition Services - 6503834655 or apply online at [facility] - ePASS Begin a journal as discussed. Be aware of your mood, feelings, changes,etc and journal this too Start / continue relaxed breathing 3 times daily Keep all upcoming appointment discussed today Continue with compliance of taking medication prescribed by Doctor Self Support options  (enjoy time with family, boyfriend and your upcoming trip) Review your EMMI educational information Look for an e-mail from [employer]. Call "988" for urgent mental health support/crisis line support available 24/7 Call :911" for urgent/emergent needs        Our next appointment is by telephone on 08/08/23  Please call the care guide team at [phone] if you need to cancel or reschedule your appointment.   If you are experiencing a Mental Health or Behavioral Health  Crisis or need someone to talk to, please call the Suicide and Crisis Lifeline: 988 call 911   The patient verbalized understanding of instructions, educational materials, and care plan provided today and DECLINED offer to receive copy of patient instructions, educational materials, and care plan.   Telephone follow up appointment with care management team member scheduled for: 08/08/23  Reece Levy, MSW, LCSW [place]  [facility], [facility] Licensed Clinical Social Worker Care Coordinator  [phone]

## 2023-07-13 ENCOUNTER — Encounter: Payer: Self-pay | Admitting: Family Medicine

## 2023-07-13 ENCOUNTER — Ambulatory Visit (INDEPENDENT_AMBULATORY_CARE_PROVIDER_SITE_OTHER): Payer: PPO | Admitting: Family Medicine

## 2023-07-13 ENCOUNTER — Ambulatory Visit: Payer: PPO | Admitting: Family Medicine

## 2023-07-13 DIAGNOSIS — M25562 Pain in left knee: Secondary | ICD-10-CM | POA: Diagnosis not present

## 2023-07-13 DIAGNOSIS — G8929 Other chronic pain: Secondary | ICD-10-CM

## 2023-07-13 DIAGNOSIS — E039 Hypothyroidism, unspecified: Secondary | ICD-10-CM | POA: Diagnosis not present

## 2023-07-13 DIAGNOSIS — K219 Gastro-esophageal reflux disease without esophagitis: Secondary | ICD-10-CM

## 2023-07-13 DIAGNOSIS — M25552 Pain in left hip: Secondary | ICD-10-CM | POA: Diagnosis not present

## 2023-07-13 DIAGNOSIS — Z7984 Long term (current) use of oral hypoglycemic drugs: Secondary | ICD-10-CM

## 2023-07-13 DIAGNOSIS — M25561 Pain in right knee: Secondary | ICD-10-CM

## 2023-07-13 DIAGNOSIS — G2581 Restless legs syndrome: Secondary | ICD-10-CM | POA: Diagnosis not present

## 2023-07-13 DIAGNOSIS — M25551 Pain in right hip: Secondary | ICD-10-CM | POA: Diagnosis not present

## 2023-07-13 DIAGNOSIS — Z7985 Long-term (current) use of injectable non-insulin antidiabetic drugs: Secondary | ICD-10-CM | POA: Diagnosis not present

## 2023-07-13 DIAGNOSIS — E7800 Pure hypercholesterolemia, unspecified: Secondary | ICD-10-CM

## 2023-07-13 DIAGNOSIS — E1165 Type 2 diabetes mellitus with hyperglycemia: Secondary | ICD-10-CM

## 2023-07-13 LAB — HEMOGLOBIN A1C: Hgb A1c MFr Bld: 5.3 % (ref 4.6–6.5)

## 2023-07-13 MED ORDER — LEVOTHYROXINE SODIUM 75 MCG PO TABS
75.0000 ug | ORAL_TABLET | Freq: Every day | ORAL | 2 refills | Status: DC
Start: 2023-07-13 — End: 2024-04-19

## 2023-07-13 MED ORDER — PANTOPRAZOLE SODIUM 40 MG PO TBEC: 40.0000 mg | DELAYED_RELEASE_TABLET | Freq: Every day | ORAL | 1 refills | Status: DC

## 2023-07-13 MED ORDER — OZEMPIC (0.25 OR 0.5 MG/DOSE) 2 MG/3ML ~~LOC~~ SOPN
0.2500 mg | PEN_INJECTOR | SUBCUTANEOUS | 1 refills | Status: DC
Start: 2023-07-13 — End: 2023-12-01

## 2023-07-13 MED ORDER — GABAPENTIN 300 MG PO CAPS
300.0000 mg | ORAL_CAPSULE | Freq: Three times a day (TID) | ORAL | 0 refills | Status: DC
Start: 2023-07-13 — End: 2023-08-25

## 2023-07-13 MED ORDER — ATORVASTATIN CALCIUM 20 MG PO TABS
ORAL_TABLET | ORAL | 3 refills | Status: DC
Start: 2023-07-13 — End: 2024-07-30

## 2023-07-13 MED ORDER — METFORMIN HCL 500 MG PO TABS
500.0000 mg | ORAL_TABLET | Freq: Two times a day (BID) | ORAL | 3 refills | Status: DC
Start: 2023-07-13 — End: 2023-10-24

## 2023-07-13 NOTE — Progress Notes (Signed)
Established Patient Office Visit   Subjective:  Patient ID: [name], female    DOB: Nov 30, 1970  Age: 52 y.o. MRN: [id number]  Chief Complaint  Patient presents with   Follow-up    Discuss Weight gain     HPI Encounter Diagnoses  Name Primary?   Elevated cholesterol    Adult hypothyroidism    Type 2 diabetes mellitus with hyperglycemia, without long-term current use of insulin (HCC)    Gastroesophageal reflux disease without esophagitis    RLS (restless legs syndrome)    Chronic arthralgias of knees and hips    For follow-up of above and concerned about weight gain since discontinuing Ozempic.  She has gained 9 pounds since last visit.  Her appetite is beyond her control.  Adjusted ideal body weight is 136.  She is weighing 159 today.  She would like to restart the Ozempic.   Review of Systems  Constitutional: Negative.   HENT: Negative.    Eyes:  Negative for blurred vision, discharge and redness.  Respiratory: Negative.    Cardiovascular: Negative.   Gastrointestinal:  Negative for abdominal pain.  Genitourinary: Negative.   Musculoskeletal: Negative.  Negative for myalgias.  Skin:  Negative for rash.  Neurological:  Negative for tingling, loss of consciousness and weakness.  Endo/Heme/Allergies:  Negative for polydipsia.     Current Outpatient Medications:    ALPRAZolam (XANAX) 0.5 MG tablet, Take 0.5 mg by mouth 3 (three) times daily as needed for anxiety., Disp: , Rfl:    ARIPiprazole (ABILIFY) 5 MG tablet, Take 1 tablet (5 mg total) by mouth daily., Disp: 30 tablet, Rfl: 2   aspirin-acetaminophen-caffeine (EXCEDRIN MIGRAINE) 250-250-65 MG tablet, Take 1 tablet by mouth every 6 (six) hours as needed for migraine., Disp: , Rfl:    blood glucose meter kit and supplies KIT, Dispense based on patient and insurance preference. Check glucose before breakfast. ICD: E11.65, Disp: 1 each, Rfl: 0   diclofenac Sodium (VOLTAREN) 1 % GEL, APPLY TWO GRAMS TOPICALLY DAILY AS  NEEDED, Disp: 200 g, Rfl: 0   FINACEA 15 % FOAM, Apply 1 application  topically 2 (two) times daily as needed (rosacea)., Disp: , Rfl:    glucose blood test strip, Use as instructed, check glucose before breakfast. ICD: E11.65, Disp: 100 each, Rfl: 12   lamoTRIgine (LAMICTAL) 150 MG tablet, Take 300 mg by mouth daily., Disp: , Rfl:    Lancets (FREESTYLE) lancets, Use as instructed. E11.65, Disp: 100 each, Rfl: 12   lidocaine (XYLOCAINE) 2 % solution, Use as directed 15 mLs in the mouth or throat every 3 (three) hours as needed for mouth pain. Swish and spit., Disp: 100 mL, Rfl: 0   methylphenidate (RITALIN) 20 MG tablet, Take 20 mg by mouth 2 (two) times daily., Disp: , Rfl:    polyethylene glycol powder (GLYCOLAX/MIRALAX) 17 GM/SCOOP powder, Take 17 g by mouth daily., Disp: 850 g, Rfl: 3   Semaglutide,0.25 or 0.5MG /DOS, (OZEMPIC, 0.25 OR 0.5 MG/DOSE,) 2 MG/3ML SOPN, Inject 0.25 mg into the skin once a week., Disp: 3 mL, Rfl: 1   traZODone (DESYREL) 100 MG tablet, Take 100 mg by mouth at bedtime., Disp: , Rfl:    atorvastatin (LIPITOR) 20 MG tablet, TAKE 1 TABLET(20 MG) BY MOUTH DAILY, Disp: 100 tablet, Rfl: 3   gabapentin (NEURONTIN) 300 MG capsule, Take 1 capsule (300 mg total) by mouth 3 (three) times daily., Disp: 90 capsule, Rfl: 0   levothyroxine (SYNTHROID) 75 MCG tablet, Take 1 tablet (75 mcg  total) by mouth daily before breakfast., Disp: 90 tablet, Rfl: 2   metFORMIN (GLUCOPHAGE) 500 MG tablet, Take 1 tablet (500 mg total) by mouth 2 (two) times daily with a meal., Disp: 180 tablet, Rfl: 3   pantoprazole (PROTONIX) 40 MG tablet, Take 1 tablet (40 mg total) by mouth daily., Disp: 90 tablet, Rfl: 1   Objective:     BP 122/78   Pulse 72   Temp 97.8 F (36.6 C) (Temporal)   Wt 159 lb (72.1 kg)   LMP 04/11/2023 (Approximate)   SpO2 98%   BMI 27.29 kg/m  Wt Readings from Last 3 Encounters:  07/13/23 159 lb (72.1 kg)  06/16/23 151 lb 12.8 oz (68.9 kg)  05/15/23 150 lb 12.8 oz (68.4  kg)      Physical Exam Constitutional:      General: She is not in acute distress.    Appearance: Normal appearance. She is not ill-appearing, toxic-appearing or diaphoretic.  HENT:     Head: Normocephalic and atraumatic.     Right Ear: External ear normal.     Left Ear: External ear normal.     Mouth/Throat:     Mouth: Mucous membranes are moist.     Pharynx: Oropharynx is clear. No oropharyngeal exudate or posterior oropharyngeal erythema.  Eyes:     General: No scleral icterus.       Right eye: No discharge.        Left eye: No discharge.     Extraocular Movements: Extraocular movements intact.     Conjunctiva/sclera: Conjunctivae normal.     Pupils: Pupils are equal, round, and reactive to light.  Cardiovascular:     Rate and Rhythm: Normal rate and regular rhythm.  Pulmonary:     Effort: Pulmonary effort is normal. No respiratory distress.     Breath sounds: Normal breath sounds.  Musculoskeletal:     Cervical back: No rigidity or tenderness.  Skin:    General: Skin is warm and dry.  Neurological:     Mental Status: She is alert and oriented to person, place, and time.  Psychiatric:        Mood and Affect: Mood normal.        Behavior: Behavior normal.      No results found for any visits on 07/13/23.    The 10-year ASCVD risk score (Arnett DK, et al., 2019) is: 5.7%    Assessment & Plan:   Elevated cholesterol -     Atorvastatin Calcium; TAKE 1 TABLET(20 MG) BY MOUTH DAILY  Dispense: 100 tablet; Refill: 3 -     LDL cholesterol, direct  Adult hypothyroidism -     Levothyroxine Sodium; Take 1 tablet (75 mcg total) by mouth daily before breakfast.  Dispense: 90 tablet; Refill: 2 -     TSH  Type 2 diabetes mellitus with hyperglycemia, without long-term current use of insulin (HCC) -     metFORMIN HCl; Take 1 tablet (500 mg total) by mouth 2 (two) times daily with a meal.  Dispense: 180 tablet; Refill: 3 -     Hemoglobin A1c -     Ozempic (0.25 or 0.5  MG/DOSE); Inject 0.25 mg into the skin once a week.  Dispense: 3 mL; Refill: 1  Gastroesophageal reflux disease without esophagitis -     Pantoprazole Sodium; Take 1 tablet (40 mg total) by mouth daily.  Dispense: 90 tablet; Refill: 1  RLS (restless legs syndrome) -     Gabapentin; Take 1 capsule (300  mg total) by mouth 3 (three) times daily.  Dispense: 90 capsule; Refill: 0  Chronic arthralgias of knees and hips -     Gabapentin; Take 1 capsule (300 mg total) by mouth 3 (three) times daily.  Dispense: 90 capsule; Refill: 0    Return in about 6 months (around 01/10/2024).    Mliss Sax, MD

## 2023-07-14 LAB — LDL CHOLESTEROL, DIRECT: Direct LDL: 85 mg/dL

## 2023-07-14 LAB — TSH: TSH: 1.85 u[IU]/mL (ref 0.35–5.50)

## 2023-07-24 ENCOUNTER — Other Ambulatory Visit: Payer: Self-pay | Admitting: Family Medicine

## 2023-07-31 ENCOUNTER — Ambulatory Visit: Payer: Self-pay

## 2023-07-31 NOTE — Patient Outreach (Signed)
  Care Coordination   07/31/2023 Name: [name] MRN: [id number] DOB: [birth year]   Care Coordination Outreach Attempts:  An unsuccessful telephone outreach was attempted for a scheduled appointment today.  Follow Up Plan:  Additional outreach attempts will be made to offer the patient care coordination information and services.   Encounter Outcome:  No Answer   Care Coordination Interventions:  No, not indicated    Sevag Shearn Idelle Jo, RN, MSN RN Care Manager [facility], Population Health Direct Dial: [phone]  Fax: [phone] Website: [email or web address]

## 2023-08-08 ENCOUNTER — Ambulatory Visit: Payer: Self-pay | Admitting: *Deleted

## 2023-08-08 NOTE — Patient Outreach (Signed)
  Care Coordination   Follow Up Visit Note   08/08/2023 Name: CORTNEE CAMPBELL MRN: [id number] DOB: 18-Aug-1971  Tinnie L Zuercher is a 52 y.o. year old female who sees Mliss Sax, MD for primary care. I spoke with  Athene L Dietz by phone today.  What matters to the patients health and wellness today?  Started a part time job Nurse, mental health) and caught a cold- not feeling well today.    Goals Addressed               This Visit's Progress     Improve mental health and overall quality of life (pt-stated)        Activities and task to complete in order to accomplish goals.   Continue with new RX started last week by Dr Tressie Ellis Follow up on list provided and/or  Dr Tressie Ellis for counselor recommendations as discussed Review and apply online or go to Department of Social Services (Medicaid, Sales executive, emergency assist) via EPASS.GOV Call your insurance provider for more information about your Enhanced Benefits (Behavioral Health Navigator ) Continue to work with "ticket to work" program for job search Review material being emailed to you : [employer], DSS, etc (also #'s provided to you today)  Follow up on food resources [facility] (select "find help") - Find Food  [place], [place] (ghpfa.org) or download the free app to your smart phone Food and Nutrition Services - [phone] or apply online at [facility] - ePASS Continue listening to "music" to help relax and with sleep Begin a journal as discussed. Be aware of your mood, feelings, changes,etc and journal this too Start / continue relaxed breathing 3 times daily Keep all upcoming appointment discussed today Continue with compliance of taking medication prescribed by Doctor Self Support options  (enjoy time with family, boyfriend and your upcoming trip) Review your EMMI educational information Look for an e-mail from [employer]. Call "988" for urgent mental health support/crisis line  support available 24/7 Call :911" for urgent/emergent needs        SDOH assessments and interventions completed:  Yes     Care Coordination Interventions:  Yes, provided  Interventions Today    Flowsheet Row Most Recent Value  General Interventions   General Interventions Discussed/Reviewed Community Resources  [pt previously provided with food resources/sites]  Mental Health Interventions   Mental Health Discussed/Reviewed Mental Health Discussed  [Pt excited to have started a part time job- she is planning to see her Psychiatrist 12/19 and will ask about counselor recommendations. Pt concerned about copay being a deterrent at this time and is encouraged to journal, listen to the music, etc]       Follow up plan: Follow up call scheduled for 08/25/23    Encounter Outcome:  Patient Visit Completed

## 2023-08-08 NOTE — Patient Instructions (Signed)
Visit Information  Thank you for taking time to visit with me today. Please don't hesitate to contact me if I can be of assistance to you.   Following are the goals we discussed today:   Goals Addressed               This Visit's Progress     Improve mental health and overall quality of life (pt-stated)        Activities and task to complete in order to accomplish goals.   Continue with new RX started last week by Dr Tressie Ellis Follow up on list provided and/or  Dr Tressie Ellis for counselor recommendations as discussed Review and apply online or go to Department of Social Services (Medicaid, Sales executive, emergency assist) via EPASS.GOV Call your insurance provider for more information about your Enhanced Benefits (Behavioral Health Navigator ) Continue to work with "ticket to work" program for job search Review material being emailed to you : [employer], DSS, etc (also #'s provided to you today)  Follow up on food resources [facility] (select "find help") - Find Food  [place], [place] (ghpfa.org) or download the free app to your smart phone Food and Nutrition Services - [phone] or apply online at [facility] - ePASS Continue listening to "music" to help relax and with sleep Begin a journal as discussed. Be aware of your mood, feelings, changes,etc and journal this too Start / continue relaxed breathing 3 times daily Keep all upcoming appointment discussed today Continue with compliance of taking medication prescribed by Doctor Self Support options  (enjoy time with family, boyfriend and your upcoming trip) Review your EMMI educational information Look for an e-mail from [employer]. Call "988" for urgent mental health support/crisis line support available 24/7 Call :911" for urgent/emergent needs        Our next appointment is by telephone on 08/25/23   Please call the care guide team at [phone] if you need to cancel or  reschedule your appointment.   If you are experiencing a Mental Health or Behavioral Health Crisis or need someone to talk to, please call the Suicide and Crisis Lifeline: 988 call 911   The patient verbalized understanding of instructions, educational materials, and care plan provided today and DECLINED offer to receive copy of patient instructions, educational materials, and care plan.   Telephone follow up appointment with care management team member scheduled for: 08/25/23 Reece Levy, MSW, LCSW [place]  [facility], [facility] Licensed Clinical Social Worker Care Coordinator  [phone]

## 2023-08-10 ENCOUNTER — Telehealth: Payer: Self-pay

## 2023-08-10 NOTE — Patient Instructions (Signed)
Visit Information  Thank you for taking time to visit with me today. Please don't hesitate to contact me if I can be of assistance to you.   Following are the goals we discussed today:   Goals Addressed             This Visit's Progress    Diabetes Management       Patient Goals/Self Care Activities: -Patient/Caregiver will self-administer medications as prescribed as evidenced by self-report/primary caregiver report  -Patient/Caregiver will attend all scheduled provider appointments as evidenced by clinician review of documented attendance to scheduled appointments and patient/caregiver report -Patient/Caregiver will call provider office for new concerns or questions as evidenced by review of documented incoming telephone call notes and patient report  -check blood sugar at prescribed times -check blood sugar if I feel it is too high or too low -record values and write them down take them to all doctor visits    Patient spoke with patient, she is doing alright.  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  She is back on ozempic due to weight gain but has lost 6 lbs since being back on and her morning sugar was 99.  She reports mentally she is doing okay.  Reiterated diabetes management and importance.  She verbalized understanding.  No concerns.          Our next appointment is by telephone on 09/13/23 at 1200 pm  Please call the care guide team at [phone] if you need to cancel or reschedule your appointment.   If you are experiencing a Mental Health or Behavioral Health Crisis or need someone to talk to, please call the Suicide and Crisis Lifeline: 988   Patient verbalizes understanding of instructions and care plan provided today and agrees to view in MyChart. Active MyChart status and patient understanding of how to access instructions and care plan via MyChart confirmed with patient.     The patient has been provided with contact information for  the care management team and has been advised to call with any health related questions or concerns.   Bary Leriche, RN, MSN RN Care Manager [facility], Population Health Direct Dial: [phone]  Fax: [phone] Website: [email or web address]

## 2023-08-10 NOTE — Patient Outreach (Signed)
  Care Coordination   Follow Up Visit Note   08/10/2023 Name: [name] MRN: [id number] DOB: October 21, 1970  [name] is a 52 y.o. year old female who sees Mliss Sax, MD for primary care. I spoke with  [name] by phone today.  What matters to the patients health and wellness today?  Maintaining health    Goals Addressed             This Visit's Progress    Diabetes Management       Patient Goals/Self Care Activities: -Patient/Caregiver will self-administer medications as prescribed as evidenced by self-report/primary caregiver report  -Patient/Caregiver will attend all scheduled provider appointments as evidenced by clinician review of documented attendance to scheduled appointments and patient/caregiver report -Patient/Caregiver will call provider office for new concerns or questions as evidenced by review of documented incoming telephone call notes and patient report  -check blood sugar at prescribed times -check blood sugar if I feel it is too high or too low -record values and write them down take them to all doctor visits    Patient spoke with patient, she is doing alright.  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  She is back on ozempic due to weight gain but has lost 6 lbs since being back on and her morning sugar was 99.  She reports mentally she is doing okay.  Reiterated diabetes management and importance.  She verbalized understanding.  No concerns.          SDOH assessments and interventions completed:  Yes     Care Coordination Interventions:  Yes, provided   Follow up plan: Follow up call scheduled for January    Encounter Outcome:  Patient Visit Completed   Bary Leriche, RN, MSN RN Care Manager [facility], Population Health Direct Dial: [phone]  Fax: [phone] Website: [email or web address]

## 2023-08-14 ENCOUNTER — Telehealth: Payer: Self-pay | Admitting: Family Medicine

## 2023-08-14 NOTE — Telephone Encounter (Signed)
Prescription Request  08/14/2023  LOV: 07/13/2023  What is the name of the medication or equipment?  diclofenac Sodium (VOLTAREN) 1 % GEL  Pt requesting new rx to increase the amount of cream used at one time (to get a larger tube or larger amount of cream each time).  She is almost out.  Have you contacted your pharmacy to request a refill? No   Which pharmacy would you like this sent to?  [facility] - [place], [place] - [address] [place] [address] Phone: [phone] Fax: [phone]    Please advise at Mobile [phone] (mobile)

## 2023-08-15 ENCOUNTER — Other Ambulatory Visit: Payer: Self-pay

## 2023-08-15 MED ORDER — DICLOFENAC SODIUM 1 % EX GEL: 2.0000 g | Freq: Four times a day (QID) | CUTANEOUS | 0 refills | Status: DC

## 2023-08-24 ENCOUNTER — Other Ambulatory Visit: Payer: Self-pay | Admitting: Family Medicine

## 2023-08-24 DIAGNOSIS — F429 Obsessive-compulsive disorder, unspecified: Secondary | ICD-10-CM | POA: Diagnosis not present

## 2023-08-24 DIAGNOSIS — G8929 Other chronic pain: Secondary | ICD-10-CM

## 2023-08-24 DIAGNOSIS — F411 Generalized anxiety disorder: Secondary | ICD-10-CM | POA: Diagnosis not present

## 2023-08-24 DIAGNOSIS — F3132 Bipolar disorder, current episode depressed, moderate: Secondary | ICD-10-CM | POA: Diagnosis not present

## 2023-08-24 DIAGNOSIS — G2581 Restless legs syndrome: Secondary | ICD-10-CM

## 2023-08-24 DIAGNOSIS — F909 Attention-deficit hyperactivity disorder, unspecified type: Secondary | ICD-10-CM | POA: Diagnosis not present

## 2023-08-25 ENCOUNTER — Encounter: Payer: Self-pay | Admitting: *Deleted

## 2023-08-25 ENCOUNTER — Telehealth: Payer: Self-pay | Admitting: *Deleted

## 2023-08-25 NOTE — Patient Outreach (Signed)
  Care Coordination   08/25/2023 Name: [name] MRN: [id number] DOB: 01-Sep-1971   Care Coordination Outreach Attempts:  A second unsuccessful outreach was attempted today to offer the patient with information about available complex care management services.  Follow Up Plan:  Additional outreach attempts will be made to offer the patient complex care management information and services.   Encounter Outcome:  No Answer   Care Coordination Interventions:  No, not indicated   Reece Levy, MSW, LCSW [place], [facility] Licensed Clinical Social Worker Care Coordinator  [phone]

## 2023-08-29 ENCOUNTER — Ambulatory Visit: Payer: Self-pay | Admitting: *Deleted

## 2023-08-29 NOTE — Patient Outreach (Signed)
  Care Coordination   Follow Up Visit Note   08/29/2023 Name: [name] MRN: [id number] DOB: 03/01/1971  [name] is a 52 y.o. year old female who sees Mliss Sax, MD for primary care. I spoke with  [name] by phone today.  What matters to the patients health and wellness today?  Found a job; doing much better.    Goals Addressed               This Visit's Progress     Improve mental health and overall quality of life (pt-stated)        Activities and task to complete in order to accomplish goals.   Continue with new RX started  by Dr Tressie Ellis Follow up on list provided and/or  Dr Tressie Ellis for counselor recommendations as discussed as you can  Continue with music and other coping strategies Glad to hear you are enjoying your new job, interactions with people, etc Review and apply online or go to Department of Social Services (Medicaid, Sales executive, emergency assist) via EPASS.[place]OV Call your insurance provider for more information about your Enhanced Benefits (Behavioral Health Navigator ) Continue to work with "ticket to work" program for job search Review material being emailed to you : [employer], DSS, etc (also #'s provided to you today)  Follow up on food resources [facility] (select "find help") - Find Food  [place], [place] (ghpfa.org) or download the free app to your smart phone Food and Nutrition Services - 857-886-1142 or apply online at [facility] - ePASS Continue listening to "music" to help relax and with sleep Begin a journal as discussed. Be aware of your mood, feelings, changes,etc and journal this too Start / continue relaxed breathing 3 times daily Keep all upcoming appointment discussed today Continue with compliance of taking medication prescribed by Doctor Self Support options  (enjoy time with family, boyfriend and your upcoming trip) Review your EMMI educational information Look for an e-mail  from [employer]. Call "988" for urgent mental health support/crisis line support available 24/7 Call :911" for urgent/emergent needs        SDOH assessments and interventions completed:  Yes     Care Coordination Interventions:  Yes, provided  Interventions Today    Flowsheet Row Most Recent Value  Education Interventions   Provided Verbal Education On Mental Health/Coping with Illness  [Pt reports doing much better- has found a job and also finds music and other coping techniques help]  Mental Health Interventions   Mental Health Discussed/Reviewed Mental Health Discussed, Mental Health Reviewed, Coping Strategies  [Pt does not feel she can pursue counseling until she can afford it. Would like follow up from [place] and aware of newly assigned CSW to follow up with her going in to the new year.]       Follow up plan: Follow up call scheduled for 09/26/23 with newly assigned CSW, Onalee Hua.    Encounter Outcome:  Patient Visit Completed

## 2023-08-29 NOTE — Patient Instructions (Signed)
Visit Information  Thank you for taking time to visit with me today. Please don't hesitate to contact me if I can be of assistance to you.   Following are the goals we discussed today:   Goals Addressed               This Visit's Progress     Improve mental health and overall quality of life (pt-stated)        Activities and task to complete in order to accomplish goals.   Continue with new RX started  by Dr Tressie Ellis Follow up on list provided and/or  Dr Tressie Ellis for counselor recommendations as discussed as you can  Continue with music and other coping strategies Glad to hear you are enjoying your new job, interactions with people, etc Review and apply online or go to Department of Social Services (Medicaid, Sales executive, emergency assist) via EPASS.GOV Call your insurance provider for more information about your Enhanced Benefits (Behavioral Health Navigator ) Continue to work with "ticket to work" program for job search Review material being emailed to you : [employer], DSS, etc (also #'s provided to you today)  Follow up on food resources [facility] (select "find help") - Find Food  [place], [place] (ghpfa.org) or download the free app to your smart phone Food and Nutrition Services - 939-369-8692 or apply online at [facility] - ePASS Continue listening to "music" to help relax and with sleep Begin a journal as discussed. Be aware of your mood, feelings, changes,etc and journal this too Start / continue relaxed breathing 3 times daily Keep all upcoming appointment discussed today Continue with compliance of taking medication prescribed by Doctor Self Support options  (enjoy time with family, boyfriend and your upcoming trip) Review your EMMI educational information Look for an e-mail from [employer]. Call "988" for urgent mental health support/crisis line support available 24/7 Call :911" for urgent/emergent needs        your  next appointment is by telephone on 09/26/23  Please call the care guide team at [phone] if you need to cancel or reschedule your appointment.   If you are experiencing a Mental Health or Behavioral Health Crisis or need someone to talk to, please call the Suicide and Crisis Lifeline: 988 call 911   The patient verbalized understanding of instructions, educational materials, and care plan provided today and DECLINED offer to receive copy of patient instructions, educational materials, and care plan.   Telephone follow up appointment with care management team member scheduled for:09/26/23  Reece Levy, MSW, LCSW /[facility], [facility] Licensed Clinical Social Worker Care Coordinator  [phone]

## 2023-09-13 ENCOUNTER — Ambulatory Visit: Payer: Self-pay

## 2023-09-13 NOTE — Patient Outreach (Signed)
  Care Coordination   Follow Up Visit Note   09/13/2023 Name: [name] MRN: [id number] DOB: 15-Dec-1970  [name] is a 53 y.o. year old female who sees Berneta Elsie Sayre, MD for primary care. I spoke with  Darleth L Javier by phone today.  What matters to the patients health and wellness today?  Maintain health    Goals Addressed             This Visit's Progress    Diabetes Management       Patient Goals/Self Care Activities: -Patient/Caregiver will self-administer medications as prescribed as evidenced by self-report/primary caregiver report  -Patient/Caregiver will attend all scheduled provider appointments as evidenced by clinician review of documented attendance to scheduled appointments and patient/caregiver report -Patient/Caregiver will call provider office for new concerns or questions as evidenced by review of documented incoming telephone call notes and patient report  -check blood sugar at prescribed times -check blood sugar if I feel it is too high or too low -record values and write them down take them to all doctor visits    Patient reports she is doing alright.  She continues to work part time.   She is looking forward to going to see her grandmother this month.  She is talking regularly with her psychiatrist. She states her blood sugar and weight are okay.  She continues to try to lose weight.  Remains on ozempic .  Encouraged diabetes management.  She verbalized understanding.  No concerns.          SDOH assessments and interventions completed:  Yes  SDOH Interventions Today    Flowsheet Row Most Recent Value  SDOH Interventions   Housing Interventions Intervention Not Indicated  Utilities Interventions Intervention Not Indicated  Social Connections Interventions Intervention Not Indicated        Care Coordination Interventions:  Yes, provided   Follow up plan: Follow up call scheduled for February    Encounter Outcome:  Patient Visit  Completed   Bayron Dalto J Joniah Bednarski, RN, MSN RN Care Manager [facility], Population Health Direct Dial: [phone]  Fax: [phone] Website: [email or web address]

## 2023-09-13 NOTE — Patient Instructions (Signed)
 Visit Information  Thank you for taking time to visit with me today. Please don't hesitate to contact me if I can be of assistance to you.   Following are the goals we discussed today:   Goals Addressed             This Visit's Progress    Diabetes Management       Patient Goals/Self Care Activities: -Patient/Caregiver will self-administer medications as prescribed as evidenced by self-report/primary caregiver report  -Patient/Caregiver will attend all scheduled provider appointments as evidenced by clinician review of documented attendance to scheduled appointments and patient/caregiver report -Patient/Caregiver will call provider office for new concerns or questions as evidenced by review of documented incoming telephone call notes and patient report  -check blood sugar at prescribed times -check blood sugar if I feel it is too high or too low -record values and write them down take them to all doctor visits    Patient reports she is doing alright.  She continues to work part time.   She is looking forward to going to see her grandmother this month.  She is talking regularly with her psychiatrist. She states her blood sugar and weight are okay.  She continues to try to lose weight.  Remains on ozempic .  Encouraged diabetes management.  She verbalized understanding.  No concerns.          Our next appointment is by telephone on 10/12/23 at 1200 pm  Please call the care guide team at [phone] if you need to cancel or reschedule your appointment.   If you are experiencing a Mental Health or Behavioral Health Crisis or need someone to talk to, please call the Suicide and Crisis Lifeline: 988   Patient verbalizes understanding of instructions and care plan provided today and agrees to view in MyChart. Active MyChart status and patient understanding of how to access instructions and care plan via MyChart confirmed with patient.     The patient has been provided with contact  information for the care management team and has been advised to call with any health related questions or concerns.   Runa Whittingham J Siobahn Worsley, RN, MSN RN Care Manager [facility], Population Health Direct Dial: [phone]  Fax: [phone] Website: [email or web address]

## 2023-09-20 ENCOUNTER — Ambulatory Visit: Payer: PPO | Admitting: Physician Assistant

## 2023-09-20 ENCOUNTER — Encounter: Payer: Self-pay | Admitting: Physician Assistant

## 2023-09-20 ENCOUNTER — Other Ambulatory Visit: Payer: Self-pay | Admitting: Family Medicine

## 2023-09-20 ENCOUNTER — Other Ambulatory Visit (INDEPENDENT_AMBULATORY_CARE_PROVIDER_SITE_OTHER): Payer: Self-pay

## 2023-09-20 DIAGNOSIS — G8929 Other chronic pain: Secondary | ICD-10-CM

## 2023-09-20 DIAGNOSIS — M25562 Pain in left knee: Secondary | ICD-10-CM | POA: Diagnosis not present

## 2023-09-20 DIAGNOSIS — G2581 Restless legs syndrome: Secondary | ICD-10-CM

## 2023-09-20 MED ORDER — METHYLPREDNISOLONE ACETATE 40 MG/ML IJ SUSP
40.0000 mg | INTRAMUSCULAR | Status: AC | PRN
Start: 2023-09-20 — End: 2023-09-20
  Administered 2023-09-20: 40 mg via INTRA_ARTICULAR

## 2023-09-20 MED ORDER — LIDOCAINE HCL 1 % IJ SOLN
4.0000 mL | INTRAMUSCULAR | Status: AC | PRN
Start: 2023-09-20 — End: 2023-09-20
  Administered 2023-09-20: 4 mL

## 2023-09-20 NOTE — Progress Notes (Signed)
 Office Visit Note   Patient: [name]           Date of Birth: 03-12-1971           MRN: [id number] Visit Date: 09/20/2023              Requested by: Tonna Frederic, MD [address] [place],  [place] [address] PCP: Tonna Frederic, MD   Assessment & Plan: Visit Diagnoses:  1. Chronic pain of left knee     Plan: Left knee pain.  Her x-rays do not show any acute injuries.  Little sclerotic changes.  I talked about her options.  She I will go forward with an injection per her request today.  I have also encouraged her to get involved either with physical therapy or with a strengthening program on her own for her quadriceps and calf.  She is going to start doing that she belongs to a gym if she needs any guidance certainly she can contact us   Follow-Up Instructions: No follow-ups on file.   Orders:  Orders Placed This Encounter  Procedures   XR KNEE 3 VIEW LEFT   No orders of the defined types were placed in this encounter.     Procedures: Large Joint Inj on 09/20/2023 2:32 PM Indications: pain and diagnostic evaluation Details: 25 G 1.5 in needle, anteromedial approach  Arthrogram: No  Medications: 40 mg methylPREDNISolone  acetate 40 MG/ML; 4 mL lidocaine  1 % Outcome: tolerated well, no immediate complications Procedure, treatment alternatives, risks and benefits explained, specific risks discussed. Consent was given by the patient.       Clinical Data: No additional findings.   Subjective: Chief Complaint  Patient presents with   Left Knee - Pain    HPI [name] is a pleasant 53 year old woman who is a patient of Dr. Christiane Cowing.  She has a history of left knee pain.  She is status post left total hip arthroplasty done by Dr. Christiane Cowing in the past.  She said she has always had more muscle atrophy in this leg since then.  She notices that the front of her knee has a "bump on it ".  Denies any recent injury.  She thinks has had an injection before and is  requesting 1 today  Review of Systems  All other systems reviewed and are negative.    Objective: Vital Signs: There were no vitals taken for this visit.  Physical Exam Constitutional:      Appearance: Normal appearance.  Pulmonary:     Effort: Pulmonary effort is normal.  Skin:    General: Skin is warm and dry.  Neurological:     General: No focal deficit present.     Mental Status: She is alert and oriented to person, place, and time.  Psychiatric:        Mood and Affect: Mood normal.        Behavior: Behavior normal.     Ortho Exam Left knee no erythema or effusion she does have noted quadricep and calf atrophy.  This is chronic.  She has compartments are soft and nontender she has a little bit of lateral joint line pain no varus valgus instability. Specialty Comments:  No specialty comments available.  Imaging: No results found.   PMFS History: Patient Active Problem List   Diagnosis Date Noted   Inclusion cyst 02/23/2023   Hypotension 01/02/2023   Transportation insecurity 12/21/2022   Dehydration 12/21/2022   Menopause 12/21/2022   Abscess 03/30/2022  Amenorrhea 10/29/2021   Abnormal findings on diagnostic imaging of liver and biliary tract 10/13/2021   Colon cancer screening 10/13/2021   Constipation 10/13/2021   Gallstones, common bile duct 10/13/2021   Gastroesophageal reflux disease 10/13/2021   History of hepatitis C 10/13/2021   Intestinal gas excretion 10/13/2021   Liver fibrosis 10/13/2021   Right upper quadrant pain 10/13/2021   Stress at home 06/21/2021   Need for influenza vaccination 06/21/2021   Periorbital hyperpigmentation 05/03/2021   Abnormal CBC 05/03/2021   Lower respiratory infection 02/04/2021   Hematuria 12/28/2020   Status post total replacement of left hip 10/12/2020   Peripheral edema 04/02/2020   COVID-19 07/09/2019   Left hip pain 04/30/2019   Otitis externa of left ear 04/30/2019   Cholesteatoma of left ear 04/30/2019    Dysfunction of left eustachian tube 04/30/2019   Left ear pain 04/30/2019   Sciatica 04/19/2019   Class 3 severe obesity due to excess calories with serious comorbidity and body mass index (BMI) of 45.0 to 49.9 in adult (HCC) 04/19/2019   Post-nasal drip 01/10/2019   Allergic cough 01/10/2019   Obstructive sleep apnea syndrome 12/11/2018   Acute frontal sinusitis 12/10/2018   Primary osteoarthritis of left hip 11/13/2018   Morbid obesity (HCC) 11/13/2018   Elevated cholesterol 11/12/2018   Essential hypertension 11/12/2018   Allergic rhinitis due to pollen 11/12/2018   Reactive airway disease 11/12/2018   Breast pain, right 07/17/2018   Refused influenza vaccine 07/17/2018   Chronic nonintractable headache 02/12/2018   Dysuria 02/12/2018   Localized edema 02/12/2018   Hospital discharge follow-up 01/23/2018   Bladder problem 01/05/2018   Depression 01/05/2018   Insomnia 01/05/2018   Hepatitis C infection 12/01/2015   RLS (restless legs syndrome) 10/23/2015   Elevated liver enzymes 10/22/2015   Recurrent major depression-severe (HCC) 04/17/2015   GAD (generalized anxiety disorder) 04/15/2015   Bipolar depression (HCC) 04/15/2015   Severe recurrent major depression w/psychotic features, mood-congruent (HCC) 04/14/2015   Anxiety disorder 04/14/2015   Major depressive disorder, recurrent severe without psychotic features (HCC) 04/14/2015   Suicidal ideations    Chronic low back pain 01/02/2015   Adult hypothyroidism 06/25/2012   B12 deficiency 06/25/2012   Other fatigue 06/25/2012   Chronic arthralgias of knees and hips 06/25/2012   Leukocytosis 06/25/2012   Vitamin D deficiency 06/25/2012   Past Medical History:  Diagnosis Date   ADHD (attention deficit hyperactivity disorder)    Anemia    with pregnancy   Anxiety    Arthritis    Bipolar disorder (HCC)    Chronic back pain greater than 3 months duration    Depression    Diabetes mellitus without complication (HCC)     Diabetic nephropathy (HCC)    bilateral feet   Dysuria    has to have self in and out cath    Eroded bladder suspension mesh (HCC)    GERD (gastroesophageal reflux disease)    Hepatitis    C   History of COVID-19 11/2019   History of MRSA infection    after back surgery   History of sleep apnea    resolved after weight loss   Hypertension    Hypothyroidism    Leg pain, bilateral    Obesity    Obsessive-compulsive disorder    Pneumonia 12/24/2019   Rosacea    Sciatic nerve pain    Social anxiety disorder    Squamous cell carcinoma of back 2021    Family History  Problem Relation  Age of Onset   Schizophrenia Father    Schizophrenia Cousin    Hyperthyroidism Mother    Hypothyroidism Maternal Grandmother     Past Surgical History:  Procedure Laterality Date   BACK SURGERY     total of 6 back surgeries   bladder mesh  2009   DILITATION & CURRETTAGE/HYSTROSCOPY WITH NOVASURE ABLATION N/A 08/19/2016   Procedure: DILATATION & CURETTAGE WITH NOVASURE ABLATION;  Surgeon: Astrid Blamer, MD;  Location: [facility];  Service: Gynecology;  Laterality: N/A;   EUS N/A 09/09/2015   Procedure: UPPER ENDOSCOPIC ULTRASOUND (EUS) RADIAL;  Surgeon: Evangeline Hilts, MD;  Location: [facility];  Service: Endoscopy;  Laterality: N/A;   EYE SURGERY     INCONTINENCE SURGERY     SPINAL CORD STIMULATOR INSERTION     SPINAL CORD STIMULATOR REMOVAL     SPINAL FUSION  2008   times two   TOTAL HIP ARTHROPLASTY Left 10/12/2020   Procedure: LEFT TOTAL HIP ARTHROPLASTY ANTERIOR APPROACH;  Surgeon: Wes Hamman, MD;  Location: [facility];  Service: Orthopedics;  Laterality: Left;   TUBAL LIGATION  2006   Social History   Occupational History   Occupation: disability  Tobacco Use   Smoking status: Former    Current packs/day: 0.00    Average packs/day: 1 pack/day for 15.0 years (15.0 ttl pk-yrs)    Types: Cigarettes    Start date: 10/04/2006    Quit date: 10/04/2021    Years since quitting: 1.9    Smokeless tobacco: Former   Tobacco comments:    use vapor, social  Vaping Use   Vaping status: Never Used  Substance and Sexual Activity   Alcohol  use: Yes    Comment: occ   Drug use: No   Sexual activity: Yes    Birth control/protection: Surgical

## 2023-09-20 NOTE — Telephone Encounter (Signed)
Copied from CRM [id number]. Topic: Clinical - Prescription Issue >> Sep 20, 2023 12:36 PM Pascal Lux wrote: Reason for CRM: Patient stated that she is traveling tomorrow and is in need of her gabapentin (NEURONTIN) 300 MG capsule [[id number]] for her restless legs syndrome. She stated Dr. Doreene Burke advised her to follow up within 6 months.

## 2023-09-26 ENCOUNTER — Ambulatory Visit: Payer: Self-pay | Admitting: Licensed Clinical Social Worker

## 2023-09-26 DIAGNOSIS — F909 Attention-deficit hyperactivity disorder, unspecified type: Secondary | ICD-10-CM | POA: Diagnosis not present

## 2023-09-26 DIAGNOSIS — F411 Generalized anxiety disorder: Secondary | ICD-10-CM | POA: Diagnosis not present

## 2023-09-26 DIAGNOSIS — F429 Obsessive-compulsive disorder, unspecified: Secondary | ICD-10-CM | POA: Diagnosis not present

## 2023-09-26 DIAGNOSIS — F3132 Bipolar disorder, current episode depressed, moderate: Secondary | ICD-10-CM | POA: Diagnosis not present

## 2023-09-26 NOTE — Patient Instructions (Signed)
Visit Information  Thank you for taking time to visit with me today. Please don't hesitate to contact me if I can be of assistance to you.   Following are the goals we discussed today:   Goals Addressed               This Visit's Progress     Improve mental health and overall quality of life (pt-stated)        Activities and task to complete in order to accomplish goals.   Continue with new RX started  by Dr Tressie Ellis Follow up on list provided and/or  Dr Tressie Ellis for counselor recommendations as discussed as you can  Continue with music and other coping strategies Glad to hear you are enjoying your new job, interactions with people, etc Review and apply online or go to Department of Social Services (Medicaid, Sales executive, emergency assist) via EPASS.GOV Call your insurance provider for more information about your Enhanced Benefits [facility] Health Navigator ) Review material being emailed to you : Voc Rehab, DSS, etc (also #'s provided to you today)  Follow up on food resources [facility] (select "find help") - Find Food  [place], [place] (ghpfa.org) or download the free app to your smart phone Food and Nutrition Services - (603) 577-9526 or apply online at [facility] - ePASS Continue listening to "music" to help relax and with sleep Begin a journal as discussed. Be aware of your mood, feelings, changes,etc and journal this too Start / continue relaxed breathing 3 times daily Keep all upcoming appointment discussed today Continue with compliance of taking medication prescribed by Doctor Self Support options  (enjoy time with family, boyfriend and your upcoming trip) Review your EMMI educational information Look for an e-mail from [employer]. Call "988" for urgent mental health support/crisis line support available 24/7 Call :911" for urgent/emergent needs Begin going to gym as discussed Continue working part time and watching  grandson multiple days in the week Communicate with psychiatrist regarding medication changes        Our next appointment is by telephone on 10/27/23.  Please call the care guide team at [phone] if you need to cancel or reschedule your appointment.   If you are experiencing a Mental Health or Behavioral Health Crisis or need someone to talk to, please call the Suicide and Crisis Lifeline: 988  Patient verbalizes understanding of instructions and care plan provided today and agrees to view in MyChart. Active MyChart status and patient understanding of how to access instructions and care plan via MyChart confirmed with patient.     Telephone follow up appointment with care management team member scheduled for: 10/27/2023

## 2023-09-26 NOTE — Patient Outreach (Signed)
Care Coordination   Follow Up Visit Note   09/26/2023 Name: [name] MRN: [id number] DOB: 1971/07/22  [name] is a 53 y.o. year old female who sees Mliss Sax, MD for primary care. I spoke with  [name] by phone today.  What matters to the patients health and wellness today?  Continuing to work on [facility] symptoms and manage mood better.    Goals Addressed               This Visit's Progress     Improve mental health and overall quality of life (pt-stated)        Activities and task to complete in order to accomplish goals.   Continue with new RX started  by Dr Tressie Ellis Follow up on list provided and/or  Dr Tressie Ellis for counselor recommendations as discussed as you can  Continue with music and other coping strategies Glad to hear you are enjoying your new job, interactions with people, etc Review and apply online or go to Department of Social Services (Medicaid, Sales executive, emergency assist) via EPASS.GOV Call your insurance provider for more information about your Enhanced Benefits [facility] Health Navigator ) Review material being emailed to you : Voc Rehab, DSS, etc (also #'s provided to you today)  Follow up on food resources [facility] (select "find help") - Find Food  [place], [place] (ghpfa.org) or download the free app to your smart phone Food and Nutrition Services - 910 178 8166 or apply online at [facility] - ePASS Continue listening to "music" to help relax and with sleep Begin a journal as discussed. Be aware of your mood, feelings, changes,etc and journal this too Start / continue relaxed breathing 3 times daily Keep all upcoming appointment discussed today Continue with compliance of taking medication prescribed by Doctor Self Support options  (enjoy time with family, boyfriend and your upcoming trip) Review your EMMI educational information Look for an e-mail from [employer]. Call  "988" for urgent mental health support/crisis line support available 24/7 Call :911" for urgent/emergent needs Begin going to gym as discussed Continue working part time and watching grandson multiple days in the week Communicate with psychiatrist regarding medication changes        SDOH assessments and interventions completed:  Yes     Care Coordination Interventions:  Yes, provided   Interventions Today    Flowsheet Row Most Recent Value  Chronic Disease   Chronic disease during today's visit Other  [Anxiety and Depression]  General Interventions   General Interventions Discussed/Reviewed [employer]  [Pt reported having high water bill this month, pt has been communication with water company and plans on using next paycheck to hopefully pay it off. CSW and pt went over community resources including DSS office.]  Exercise Interventions   Exercise Discussed/Reviewed Exercise Discussed  [Pt would like to start going back to gym to help with physical and mental health. Plans on going with a friend but would like to go alone once she builds up confidence. We discussed skills for this including listening to music.]  Mental Health Interventions   Mental Health Discussed/Reviewed Mental Health Discussed, Mental Health Reviewed, Anxiety, Depression, Coping Strategies  [CSW and pt reviewed pt's MH history and current stressors. Pt reports feeling better since going back to work and is taking care of grandchild on a regular basis. Pt tries to focus on positives in life and is utilizing coping skills.]  Pharmacy Interventions  Pharmacy Dicussed/Reviewed Pharmacy Topics Discussed  [Pt is currently seeing psychiatrist for medication management.]        Follow up plan: Follow up call scheduled for 10/27/23    Encounter Outcome:  Patient Visit Completed   Kenton Kingfisher, LCSW [facility], [facility] Health Licensed Clinical Social Worker Care  Coordinator [phone]

## 2023-09-29 ENCOUNTER — Other Ambulatory Visit: Payer: Self-pay

## 2023-10-06 DIAGNOSIS — F411 Generalized anxiety disorder: Secondary | ICD-10-CM | POA: Diagnosis not present

## 2023-10-06 DIAGNOSIS — F429 Obsessive-compulsive disorder, unspecified: Secondary | ICD-10-CM | POA: Diagnosis not present

## 2023-10-06 DIAGNOSIS — F3132 Bipolar disorder, current episode depressed, moderate: Secondary | ICD-10-CM | POA: Diagnosis not present

## 2023-10-06 DIAGNOSIS — F909 Attention-deficit hyperactivity disorder, unspecified type: Secondary | ICD-10-CM | POA: Diagnosis not present

## 2023-10-07 DIAGNOSIS — J069 Acute upper respiratory infection, unspecified: Secondary | ICD-10-CM | POA: Diagnosis not present

## 2023-10-10 ENCOUNTER — Telehealth: Payer: Self-pay

## 2023-10-10 DIAGNOSIS — E119 Type 2 diabetes mellitus without complications: Secondary | ICD-10-CM | POA: Diagnosis not present

## 2023-10-10 LAB — HM DIABETES EYE EXAM

## 2023-10-10 NOTE — Telephone Encounter (Signed)
Copied from CRM [id number]. Topic: General - Other >> Oct 10, 2023 12:05 PM [place] A wrote: Reason for CRM: Patient wanted to let Dr.Kremer know that she went to [facility] and has [employer] above. FYI

## 2023-10-11 ENCOUNTER — Encounter: Payer: Self-pay | Admitting: Family Medicine

## 2023-10-12 ENCOUNTER — Ambulatory Visit: Payer: Self-pay

## 2023-10-12 NOTE — Patient Outreach (Signed)
  Care Coordination   Follow Up Visit Note   10/12/2023 Name: [name] MRN: [id number] DOB: [birth year]  [name] is a 53 y.o. year old female who sees Berneta Elsie Sayre, MD for primary care. I spoke with  [name] by phone today.  What matters to the patients health and wellness today?  COVID-19 infection    Goals Addressed             This Visit's Progress    Diabetes Management       Patient Goals/Self Care Activities: -Patient/Caregiver will self-administer medications as prescribed as evidenced by self-report/primary caregiver report  -Patient/Caregiver will attend all scheduled provider appointments as evidenced by clinician review of documented attendance to scheduled appointments and patient/caregiver report -Patient/Caregiver will call provider office for new concerns or questions as evidenced by review of documented incoming telephone call notes and patient report  -check blood sugar at prescribed times -check blood sugar if I feel it is too high or too low -record values and write them down take them to all doctor visits    Patient reports she has COVID.  She went to urgent care and was given Paxlovid,  Discussed COVID and precautions and suggest mask wearing to protect herself.  She states her blood sugars are running to 80-100.  Discussed hypoglycemia.  Encouraged continued diabetes management.  She verbalized understanding.  No concerns.          SDOH assessments and interventions completed:  Yes     Care Coordination Interventions:  Yes, provided   Follow up plan: Follow up call scheduled for March    Encounter Outcome:  Patient Visit Completed   Sai Moura J. Tonji Elliff RN, MSN [facility], [facility] Health RN Care Manager Direct Dial: [phone]  Fax: [phone] Website: [email or web address]

## 2023-10-12 NOTE — Patient Instructions (Signed)
 Visit Information  Thank you for taking time to visit with me today. Please don't hesitate to contact me if I can be of assistance to you.   Following are the goals we discussed today:   Goals Addressed             This Visit's Progress    Diabetes Management       Patient Goals/Self Care Activities: -Patient/Caregiver will self-administer medications as prescribed as evidenced by self-report/primary caregiver report  -Patient/Caregiver will attend all scheduled provider appointments as evidenced by clinician review of documented attendance to scheduled appointments and patient/caregiver report -Patient/Caregiver will call provider office for new concerns or questions as evidenced by review of documented incoming telephone call notes and patient report  -check blood sugar at prescribed times -check blood sugar if I feel it is too high or too low -record values and write them down take them to all doctor visits    Patient reports she has COVID.  She went to urgent care and was given Paxlovid,  Discussed COVID and precautions and suggest mask wearing to protect herself.  She states her blood sugars are running to 80-100.  Discussed hypoglycemia.  Encouraged continued diabetes management.  She verbalized understanding.  No concerns.          Our next appointment is by telephone on 11/09/23 at 100 pm  Please call the care guide team at [phone] if you need to cancel or reschedule your appointment.   If you are experiencing a Mental Health or Behavioral Health Crisis or need someone to talk to, please call the Suicide and Crisis Lifeline: 988   Patient verbalizes understanding of instructions and care plan provided today and agrees to view in MyChart. Active MyChart status and patient understanding of how to access instructions and care plan via MyChart confirmed with patient.     The patient has been provided with contact information for the care management team and has been advised  to call with any health related questions or concerns.   Hoang Reich J. Brisha Mccabe RN, MSN [facility], [facility] Health RN Care Manager Direct Dial: [phone]  Fax: [phone] Website: [email or web address]

## 2023-10-23 ENCOUNTER — Other Ambulatory Visit: Payer: Self-pay | Admitting: Family Medicine

## 2023-10-23 DIAGNOSIS — G8929 Other chronic pain: Secondary | ICD-10-CM

## 2023-10-23 DIAGNOSIS — G2581 Restless legs syndrome: Secondary | ICD-10-CM

## 2023-10-23 NOTE — Telephone Encounter (Signed)
Refill request for  Gabapentin 300 mg LR  09/20/23, #90, 0 rf LOV  07/13/23 FOV  01/11/24  Please review and advise.  Thanks. Dm/cma

## 2023-10-24 ENCOUNTER — Other Ambulatory Visit: Payer: Self-pay | Admitting: Family Medicine

## 2023-10-24 DIAGNOSIS — E1165 Type 2 diabetes mellitus with hyperglycemia: Secondary | ICD-10-CM

## 2023-11-01 ENCOUNTER — Telehealth: Payer: Self-pay

## 2023-11-01 NOTE — Telephone Encounter (Signed)
 Copied from CRM [id number]. Topic: General - Other >> Nov 01, 2023  9:38 AM Rodman Pickle T wrote: Reason for CRM: patient is not feeling well and has excessive hunger she would like to be seen today she would like a call back regarding this  Called patient and scheduled her an appointment for 11/06/23 @ 10:20 am with Dr Doreene Burke. Dm/cma

## 2023-11-03 DIAGNOSIS — F411 Generalized anxiety disorder: Secondary | ICD-10-CM | POA: Diagnosis not present

## 2023-11-03 DIAGNOSIS — F909 Attention-deficit hyperactivity disorder, unspecified type: Secondary | ICD-10-CM | POA: Diagnosis not present

## 2023-11-03 DIAGNOSIS — F429 Obsessive-compulsive disorder, unspecified: Secondary | ICD-10-CM | POA: Diagnosis not present

## 2023-11-03 DIAGNOSIS — F3132 Bipolar disorder, current episode depressed, moderate: Secondary | ICD-10-CM | POA: Diagnosis not present

## 2023-11-06 ENCOUNTER — Encounter: Payer: Self-pay | Admitting: Family Medicine

## 2023-11-06 ENCOUNTER — Ambulatory Visit (INDEPENDENT_AMBULATORY_CARE_PROVIDER_SITE_OTHER): Payer: PPO | Admitting: Family Medicine

## 2023-11-06 VITALS — BP 110/78 | HR 71 | Temp 98.4°F | Ht 64.0 in | Wt 172.0 lb

## 2023-11-06 DIAGNOSIS — E663 Overweight: Secondary | ICD-10-CM | POA: Insufficient documentation

## 2023-11-06 DIAGNOSIS — L600 Ingrowing nail: Secondary | ICD-10-CM

## 2023-11-06 DIAGNOSIS — Z6829 Body mass index (BMI) 29.0-29.9, adult: Secondary | ICD-10-CM | POA: Diagnosis not present

## 2023-11-06 NOTE — Progress Notes (Signed)
 Established Patient Office Visit   Subjective:  Patient ID: [name], female    DOB: 1971/01/28  Age: 53 y.o. MRN: [id number]  Chief Complaint  Patient presents with   hunger     Pt states she is constantly hunger and eating.     HPI Encounter Diagnoses  Name Primary?   Overweight (BMI 25.0-29.9) Yes   Ingrown nail of great toe    Reports ravenous appetite.  She continues with metformin 500 twice daily and 0.25 of semaglutide.  Last A1c was 5.3.  Fasting sugars have been in the low 80s.  Had lost too much weight on the higher doses of semaglutide.  She continues with Ritalin.  Reports ingrown nails in her right great toe.   Review of Systems  Constitutional: Negative.   HENT: Negative.    Eyes:  Negative for blurred vision, discharge and redness.  Respiratory: Negative.    Cardiovascular: Negative.   Gastrointestinal:  Negative for abdominal pain.  Genitourinary: Negative.   Musculoskeletal: Negative.  Negative for myalgias.  Skin:  Negative for rash.  Neurological:  Negative for tingling, loss of consciousness and weakness.  Endo/Heme/Allergies:  Negative for polydipsia.     Current Outpatient Medications:    ALPRAZolam (XANAX) 0.5 MG tablet, Take 0.5 mg by mouth 3 (three) times daily as needed for anxiety., Disp: , Rfl:    ARIPiprazole (ABILIFY) 5 MG tablet, Take 1 tablet (5 mg total) by mouth daily., Disp: 30 tablet, Rfl: 2   aspirin-acetaminophen-caffeine (EXCEDRIN MIGRAINE) 250-250-65 MG tablet, Take 1 tablet by mouth every 6 (six) hours as needed for migraine., Disp: , Rfl:    atorvastatin (LIPITOR) 20 MG tablet, TAKE 1 TABLET(20 MG) BY MOUTH DAILY, Disp: 100 tablet, Rfl: 3   blood glucose meter kit and supplies KIT, Dispense based on patient and insurance preference. Check glucose before breakfast. ICD: E11.65, Disp: 1 each, Rfl: 0   diclofenac Sodium (VOLTAREN) 1 % GEL, Apply 2 g topically 4 (four) times daily., Disp: 200 g, Rfl: 0   FINACEA 15 % FOAM, Apply  1 application  topically 2 (two) times daily as needed (rosacea)., Disp: , Rfl:    gabapentin (NEURONTIN) 300 MG capsule, TAKE ONE CAPSULE BY MOUTH THREE TIMES A DAY, Disp: 90 capsule, Rfl: 2   glucose blood test strip, Use as instructed, check glucose before breakfast. ICD: E11.65, Disp: 100 each, Rfl: 12   lamoTRIgine (LAMICTAL) 150 MG tablet, Take 300 mg by mouth daily., Disp: , Rfl:    Lancets (FREESTYLE) lancets, Use as instructed. E11.65, Disp: 100 each, Rfl: 12   levothyroxine (SYNTHROID) 75 MCG tablet, Take 1 tablet (75 mcg total) by mouth daily before breakfast., Disp: 90 tablet, Rfl: 2   lidocaine (XYLOCAINE) 2 % solution, Use as directed 15 mLs in the mouth or throat every 3 (three) hours as needed for mouth pain. Swish and spit., Disp: 100 mL, Rfl: 0   methylphenidate (RITALIN) 20 MG tablet, Take 20 mg by mouth 2 (two) times daily., Disp: , Rfl:    pantoprazole (PROTONIX) 40 MG tablet, Take 1 tablet (40 mg total) by mouth daily., Disp: 90 tablet, Rfl: 1   polyethylene glycol powder (GLYCOLAX/MIRALAX) 17 GM/SCOOP powder, Take 17 g by mouth daily., Disp: 850 g, Rfl: 3   Semaglutide,0.25 or 0.5MG /DOS, (OZEMPIC, 0.25 OR 0.5 MG/DOSE,) 2 MG/3ML SOPN, Inject 0.25 mg into the skin once a week., Disp: 3 mL, Rfl: 1   zolpidem (AMBIEN) 5 MG tablet, Take 5 mg by mouth  at bedtime as needed for sleep (Crystal Monatnew)., Disp: , Rfl:    Objective:     BP 110/78   Pulse 71   Temp 98.4 F (36.9 C)   Ht 5\' 4"  (1.626 m)   Wt 172 lb (78 kg)   SpO2 98%   BMI 29.52 kg/m  Wt Readings from Last 3 Encounters:  11/06/23 172 lb (78 kg)  07/13/23 159 lb (72.1 kg)  06/16/23 151 lb 12.8 oz (68.9 kg)      Physical Exam Constitutional:      General: She is not in acute distress.    Appearance: Normal appearance. She is not ill-appearing, toxic-appearing or diaphoretic.  HENT:     Head: Normocephalic and atraumatic.     Right Ear: External ear normal.     Left Ear: External ear normal.  Eyes:      General: No scleral icterus.       Right eye: No discharge.        Left eye: No discharge.     Extraocular Movements: Extraocular movements intact.     Conjunctiva/sclera: Conjunctivae normal.  Pulmonary:     Effort: Pulmonary effort is normal. No respiratory distress.  Skin:    General: Skin is warm and dry.       Neurological:     Mental Status: She is alert and oriented to person, place, and time.  Psychiatric:        Mood and Affect: Mood normal.        Behavior: Behavior normal.      No results found for any visits on 11/06/23.    The 10-year ASCVD risk score (Arnett DK, et al., 2019) is: 1.6%    Assessment & Plan:   Overweight (BMI 25.0-29.9)  Ingrown nail of great toe -     Ambulatory referral to Podiatry    Return Should have appointment with me on the seventh..  Patient will call for weight loss management people at [facility].  She has an established relationship there.  Will hold metformin low A1c.  And hesitant to increase semaglutide secondary to drastic weight loss higher doses.  Mliss Sax, MD

## 2023-11-07 ENCOUNTER — Ambulatory Visit: Payer: PPO | Admitting: Orthopaedic Surgery

## 2023-11-07 ENCOUNTER — Encounter: Payer: Self-pay | Admitting: Podiatry

## 2023-11-07 ENCOUNTER — Ambulatory Visit: Admitting: Podiatry

## 2023-11-07 DIAGNOSIS — B49 Unspecified mycosis: Secondary | ICD-10-CM

## 2023-11-07 DIAGNOSIS — B351 Tinea unguium: Secondary | ICD-10-CM | POA: Diagnosis not present

## 2023-11-07 DIAGNOSIS — L600 Ingrowing nail: Secondary | ICD-10-CM | POA: Diagnosis not present

## 2023-11-07 DIAGNOSIS — L608 Other nail disorders: Secondary | ICD-10-CM | POA: Diagnosis not present

## 2023-11-07 MED ORDER — FLUCONAZOLE 150 MG PO TABS: 150.0000 mg | ORAL_TABLET | Freq: Once | ORAL | 0 refills | Status: AC

## 2023-11-07 MED ORDER — DOXYCYCLINE HYCLATE 100 MG PO TABS: 100.0000 mg | ORAL_TABLET | Freq: Two times a day (BID) | ORAL | 0 refills | Status: DC

## 2023-11-07 NOTE — Patient Instructions (Signed)

## 2023-11-07 NOTE — Progress Notes (Signed)
 Subjective:   Patient ID: [name], female   DOB: 53 y.o.   MRN: [id number]   HPI Chief Complaint  Patient presents with   Ingrown Toenail    RM#13 np diabetic with ingrown toenail right big toe causing pain X3 months getting worse.   53 year old female presents here for above concerns.  She states that the last couple months been getting pain on the right big toe where the nail can ingrown.  She currently denies any drainage or pus.  The area is tender with shoes, pressure.   Review of Systems  All other systems reviewed and are negative.  Past Medical History:  Diagnosis Date   ADHD (attention deficit hyperactivity disorder)    Anemia    with pregnancy   Anxiety    Arthritis    Bipolar disorder (HCC)    Chronic back pain greater than 3 months duration    Depression    Diabetes mellitus without complication (HCC)    Diabetic nephropathy (HCC)    bilateral feet   Dysuria    has to have self in and out cath    Eroded bladder suspension mesh (HCC)    GERD (gastroesophageal reflux disease)    Hepatitis    C   History of COVID-19 11/2019   History of MRSA infection    after back surgery   History of sleep apnea    resolved after weight loss   Hypertension    Hypothyroidism    Leg pain, bilateral    Obesity    Obsessive-compulsive disorder    Pneumonia 12/24/2019   Rosacea    Sciatic nerve pain    Social anxiety disorder    Squamous cell carcinoma of back 2021    Past Surgical History:  Procedure Laterality Date   BACK SURGERY     total of 6 back surgeries   bladder mesh  2009   DILITATION & CURRETTAGE/HYSTROSCOPY WITH NOVASURE ABLATION N/A 08/19/2016   Procedure: DILATATION & CURETTAGE WITH NOVASURE ABLATION;  Surgeon: Ilda Mori, MD;  Location: [facility];  Service: Gynecology;  Laterality: N/A;   EUS N/A 09/09/2015   Procedure: UPPER ENDOSCOPIC ULTRASOUND (EUS) RADIAL;  Surgeon: Willis Modena, MD;  Location: [facility];  Service: Endoscopy;  Laterality:  N/A;   EYE SURGERY     INCONTINENCE SURGERY     SPINAL CORD STIMULATOR INSERTION     SPINAL CORD STIMULATOR REMOVAL     SPINAL FUSION  2008   times two   TOTAL HIP ARTHROPLASTY Left 10/12/2020   Procedure: LEFT TOTAL HIP ARTHROPLASTY ANTERIOR APPROACH;  Surgeon: Tarry Kos, MD;  Location: [facility];  Service: Orthopedics;  Laterality: Left;   TUBAL LIGATION  2006     Current Outpatient Medications:    ALPRAZolam (XANAX) 0.5 MG tablet, Take 0.5 mg by mouth 3 (three) times daily as needed for anxiety., Disp: , Rfl:    ARIPiprazole (ABILIFY) 5 MG tablet, Take 1 tablet (5 mg total) by mouth daily., Disp: 30 tablet, Rfl: 2   aspirin-acetaminophen-caffeine (EXCEDRIN MIGRAINE) 250-250-65 MG tablet, Take 1 tablet by mouth every 6 (six) hours as needed for migraine., Disp: , Rfl:    atorvastatin (LIPITOR) 20 MG tablet, TAKE 1 TABLET(20 MG) BY MOUTH DAILY, Disp: 100 tablet, Rfl: 3   blood glucose meter kit and supplies KIT, Dispense based on patient and insurance preference. Check glucose before breakfast. ICD: E11.65, Disp: 1 each, Rfl: 0   diclofenac Sodium (VOLTAREN) 1 % GEL, Apply 2 g  topically 4 (four) times daily., Disp: 200 g, Rfl: 0   doxycycline (VIBRA-TABS) 100 MG tablet, Take 1 tablet (100 mg total) by mouth 2 (two) times daily., Disp: 14 tablet, Rfl: 0   FINACEA 15 % FOAM, Apply 1 application  topically 2 (two) times daily as needed (rosacea)., Disp: , Rfl:    gabapentin (NEURONTIN) 300 MG capsule, TAKE ONE CAPSULE BY MOUTH THREE TIMES A DAY, Disp: 90 capsule, Rfl: 2   glucose blood test strip, Use as instructed, check glucose before breakfast. ICD: E11.65, Disp: 100 each, Rfl: 12   lamoTRIgine (LAMICTAL) 150 MG tablet, Take 300 mg by mouth daily., Disp: , Rfl:    Lancets (FREESTYLE) lancets, Use as instructed. E11.65, Disp: 100 each, Rfl: 12   levothyroxine (SYNTHROID) 75 MCG tablet, Take 1 tablet (75 mcg total) by mouth daily before breakfast., Disp: 90 tablet, Rfl: 2   lidocaine  (XYLOCAINE) 2 % solution, Use as directed 15 mLs in the mouth or throat every 3 (three) hours as needed for mouth pain. Swish and spit., Disp: 100 mL, Rfl: 0   methylphenidate (RITALIN) 20 MG tablet, Take 20 mg by mouth 2 (two) times daily., Disp: , Rfl:    pantoprazole (PROTONIX) 40 MG tablet, Take 1 tablet (40 mg total) by mouth daily., Disp: 90 tablet, Rfl: 1   polyethylene glycol powder (GLYCOLAX/MIRALAX) 17 GM/SCOOP powder, Take 17 g by mouth daily., Disp: 850 g, Rfl: 3   Semaglutide,0.25 or 0.5MG /DOS, (OZEMPIC, 0.25 OR 0.5 MG/DOSE,) 2 MG/3ML SOPN, Inject 0.25 mg into the skin once a week., Disp: 3 mL, Rfl: 1   zolpidem (AMBIEN) 5 MG tablet, Take 5 mg by mouth at bedtime as needed for sleep (Crystal Monatnew)., Disp: , Rfl:   Allergies  Allergen Reactions   Pregabalin Anaphylaxis   Bupropion Hcl Other (See Comments)   Celecoxib Nausea And Vomiting and Other (See Comments)    Stomach pain   Effexor [Venlafaxine Hcl] Other (See Comments)    Serotonin toxicity   Oxybutynin Other (See Comments)    Urinary retention   Pramipexole Other (See Comments)    Mirapex- Worsening "body jerks"   Pramipexole Dihydrochloride Other (See Comments)   Pregabalin Other (See Comments)   Prozac [Fluoxetine Hcl] Swelling   Symbyax [Olanzapine-Fluoxetine Hcl] Swelling   Venlafaxine Other (See Comments)   Desvenlafaxine Other (See Comments) and Rash    Serotonin toxicity           Objective:  Physical Exam  General: AAO x3, NAD  Dermatological: Right hallux nail in general is hypertrophic, dystrophic.  There is incurvation present to the nail borders both medial lateral nail borders.  Localized edema.  No significant cellulitis present.  There is no drainage or pus.  No open lesions.  Vascular: Dorsalis Pedis artery and Posterior Tibial artery pedal pulses are 2/4 bilateral with immedate capillary fill time.  There is no pain with calf compression, swelling, warmth, erythema.   Neruologic:  Grossly intact via light touch bilateral.   Musculoskeletal: Tenderness palpation of the ingrown toenail.  No other areas of discomfort. Gait: Unassisted, Nonantalgic.       Assessment:    Ingrown toenail right hallux     Plan:  -Treatment options discussed including all alternatives, risks, and complications -Etiology of symptoms were discussed -At this time, the patient is requesting partial nail removal with chemical matricectomy to the symptomatic portion of the nail. Risks and complications were discussed with the patient for which they understand and written consent was  obtained. Under sterile conditions a total of 3 mL of a mixture of 2% lidocaine plain and 0.5% Marcaine plain was infiltrated in a hallux block fashion.  An additional 1.5 mL of lidocaine plain was infiltrated for additional anesthetic.  Once anesthetized, the skin was prepped in sterile fashion. A tourniquet was then applied. Next the medial, lateral aspect of hallux nail border was then sharply excised making sure to remove the entire offending nail border. Once the nails were ensured to be removed area was debrided and the underlying skin was intact. There is no purulence identified in the procedure. Next phenol was then applied under standard conditions and copiously irrigated.  Silvadene was applied. A dry sterile dressing was applied. After application of the dressing the tourniquet was removed and there is found to be an immediate capillary refill time to the digit. The patient tolerated the procedure well any complications. Post procedure instructions were discussed the patient for which he verbally understood. Discussed signs/symptoms of infection and directed to call the office immediately should any occur or go directly to the emergency room. In the meantime, encouraged to call the office with any questions, concerns, changes symptoms. -Doxycycline -Nails sent for culture  Vivi Barrack DPM

## 2023-11-08 NOTE — Progress Notes (Deleted)
 Office Visit Note   Patient: [name]           Date of Birth: [birth year]           MRN: [id number] Visit Date: 11/09/2023              Requested by:  Mliss Sax, MD [address] [place],  [place] [address]  PCP: Mliss Sax, MD   Assessment & Plan: Visit Diagnoses: No diagnosis found.  Plan: ***  Follow-Up Instructions: No follow-ups on file.   Orders:  No orders of the defined types were placed in this encounter. No orders of the defined types were placed in this encounter.    Procedures: No procedures performed   Clinical Data: No additional findings.   Subjective: No chief complaint on file.  HPI  Review of Systems   Objective: Vital Signs: There were no vitals taken for this visit.  Physical Exam  Ortho Exam  Specialty Comments:  No specialty comments available.  Imaging: No results found.   PMFS History: Patient Active Problem List   Diagnosis Date Noted  . Overweight (BMI 25.0-29.9) 11/06/2023  . Inclusion cyst 02/23/2023  . Hypotension 01/02/2023  . Transportation insecurity 12/21/2022  . Dehydration 12/21/2022  . Menopause 12/21/2022  . Abscess 03/30/2022  . Amenorrhea 10/29/2021  . Abnormal findings on diagnostic imaging of liver and biliary tract 10/13/2021  . Colon cancer screening 10/13/2021  . Constipation 10/13/2021  . Gallstones, common bile duct 10/13/2021  . Gastroesophageal reflux disease 10/13/2021  . History of hepatitis C 10/13/2021  . Intestinal gas excretion 10/13/2021  . Liver fibrosis 10/13/2021  . Right upper quadrant pain 10/13/2021  . Stress at home 06/21/2021  . Need for influenza vaccination 06/21/2021  . Periorbital hyperpigmentation 05/03/2021  . Abnormal CBC 05/03/2021  . Lower respiratory infection 02/04/2021  . Hematuria 12/28/2020  . Status post total replacement of left hip 10/12/2020  . Peripheral edema 04/02/2020  . COVID-19 07/09/2019  . Left hip pain  04/30/2019  . Otitis externa of left ear 04/30/2019  . Cholesteatoma of left ear 04/30/2019  . Dysfunction of left eustachian tube 04/30/2019  . Left ear pain 04/30/2019  . Sciatica 04/19/2019  . Class 3 severe obesity due to excess calories with serious comorbidity and body mass index (BMI) of 45.0 to 49.9 in adult (HCC) 04/19/2019  . Post-nasal drip 01/10/2019  . Allergic cough 01/10/2019  . Obstructive sleep apnea syndrome 12/11/2018  . Acute frontal sinusitis 12/10/2018  . Primary osteoarthritis of left hip 11/13/2018  . Morbid obesity (HCC) 11/13/2018  . Elevated cholesterol 11/12/2018  . Essential hypertension 11/12/2018  . Allergic rhinitis due to pollen 11/12/2018  . Reactive airway disease 11/12/2018  . Breast pain, right 07/17/2018  . Refused influenza vaccine 07/17/2018  . Chronic nonintractable headache 02/12/2018  . Dysuria 02/12/2018  . Localized edema 02/12/2018  . Hospital discharge follow-up 01/23/2018  . Bladder problem 01/05/2018  . Depression 01/05/2018  . Insomnia 01/05/2018  . Hepatitis C infection 12/01/2015  . RLS (restless legs syndrome) 10/23/2015  . Elevated liver enzymes 10/22/2015  . Recurrent major depression-severe (HCC) 04/17/2015  . GAD (generalized anxiety disorder) 04/15/2015  . Bipolar depression (HCC) 04/15/2015  . Severe recurrent major depression w/psychotic features, mood-congruent (HCC) 04/14/2015  . Anxiety disorder 04/14/2015  . Major depressive disorder, recurrent severe without psychotic features (HCC) 04/14/2015  . Suicidal ideations   . Chronic low back pain 01/02/2015  . Adult hypothyroidism 06/25/2012  .  B12 deficiency 06/25/2012  . Other fatigue 06/25/2012  . Chronic arthralgias of knees and hips 06/25/2012  . Leukocytosis 06/25/2012  . Vitamin D deficiency 06/25/2012   Past Medical History:  Diagnosis Date  . ADHD (attention deficit hyperactivity disorder)   . Anemia    with pregnancy  . Anxiety   . Arthritis   .  Bipolar disorder (HCC)   . Chronic back pain greater than 3 months duration   . Depression   . Diabetes mellitus without complication (HCC)   . Diabetic nephropathy (HCC)    bilateral feet  . Dysuria    has to have self in and out cath   . Eroded bladder suspension mesh (HCC)   . GERD (gastroesophageal reflux disease)   . Hepatitis    C  . History of COVID-19 11/2019  . History of MRSA infection    after back surgery  . History of sleep apnea    resolved after weight loss  . Hypertension   . Hypothyroidism   . Leg pain, bilateral   . Obesity   . Obsessive-compulsive disorder   . Pneumonia 12/24/2019  . Rosacea   . Sciatic nerve pain   . Social anxiety disorder   . Squamous cell carcinoma of back 2021    Family History  Problem Relation Age of Onset  . Schizophrenia Father   . Schizophrenia Cousin   . Hyperthyroidism Mother   . Hypothyroidism Maternal Grandmother     Past Surgical History:  Procedure Laterality Date  . BACK SURGERY     total of 6 back surgeries  . bladder mesh  2009  . DILITATION & CURRETTAGE/HYSTROSCOPY WITH NOVASURE ABLATION N/A 08/19/2016   Procedure: DILATATION & CURETTAGE WITH NOVASURE ABLATION;  Surgeon: Ilda Mori, MD;  Location: [facility];  Service: Gynecology;  Laterality: N/A;  . EUS N/A 09/09/2015   Procedure: UPPER ENDOSCOPIC ULTRASOUND (EUS) RADIAL;  Surgeon: Willis Modena, MD;  Location: [facility];  Service: Endoscopy;  Laterality: N/A;  . EYE SURGERY    . INCONTINENCE SURGERY    . SPINAL CORD STIMULATOR INSERTION    . SPINAL CORD STIMULATOR REMOVAL    . SPINAL FUSION  2008   times two  . TOTAL HIP ARTHROPLASTY Left 10/12/2020   Procedure: LEFT TOTAL HIP ARTHROPLASTY ANTERIOR APPROACH;  Surgeon: Tarry Kos, MD;  Location: [facility];  Service: Orthopedics;  Laterality: Left;  . TUBAL LIGATION  2006   Social History   Occupational History  . Occupation: disability  Tobacco Use  . Smoking status: Former    Current packs/day: 0.00     Average packs/day: 1 pack/day for 15.0 years (15.0 ttl pk-yrs)    Types: Cigarettes    Start date: 10/04/2006    Quit date: 10/04/2021    Years since quitting: 2.0  . Smokeless tobacco: Former  . Tobacco comments:    use vapor, social  Vaping Use  . Vaping status: Never Used  Substance and Sexual Activity  . Alcohol use: Yes    Comment: occ  . Drug use: No  . Sexual activity: Yes    Birth control/protection: Surgical

## 2023-11-09 ENCOUNTER — Ambulatory Visit: Payer: PPO | Admitting: Orthopaedic Surgery

## 2023-11-09 ENCOUNTER — Ambulatory Visit: Payer: Self-pay

## 2023-11-09 NOTE — Patient Instructions (Signed)
 Visit Information  Thank you for taking time to visit with me today. Please don't hesitate to contact me if I can be of assistance to you.   Following are the goals we discussed today:   Goals Addressed             This Visit's Progress    COMPLETED: Diabetes Management       Patient Goals/Self Care Activities: -Patient/Caregiver will self-administer medications as prescribed as evidenced by self-report/primary caregiver report  -Patient/Caregiver will attend all scheduled provider appointments as evidenced by clinician review of documented attendance to scheduled appointments and patient/caregiver report -Patient/Caregiver will call provider office for new concerns or questions as evidenced by review of documented incoming telephone call notes and patient report  -check blood sugar at prescribed times -check blood sugar if I feel it is too high or too low -record values and write them down take them to all doctor visits    Patient reports she is good.  She states her blood sugars are running around 80.  Discussed hypoglycemia.  Encouraged continued diabetes management.  She verbalized understanding.  No concerns.  Discussed case closure as she is doing well with diabetes management.  She is agreeable.            If you are experiencing a Mental Health or Behavioral Health Crisis or need someone to talk to, please call the Suicide and Crisis Lifeline: 988   Patient verbalizes understanding of instructions and care plan provided today and agrees to view in MyChart. Active MyChart status and patient understanding of how to access instructions and care plan via MyChart confirmed with patient.     The patient has been provided with contact information for the care management team and has been advised to call with any health related questions or concerns.   Bary Leriche RN, MSN [facility], [facility] RN Care Manager Direct Dial: [phone]   Fax: [phone] Website: [email or web address]

## 2023-11-09 NOTE — Patient Outreach (Signed)
 Care Coordination   Follow Up Visit Note   11/09/2023 Name: [name] MRN: [id number] DOB: [birth year]  [name] is a 53 y.o. year old female who sees Claudia Sax, MD for primary care. I spoke with  [name] by phone today.  What matters to the patients health and wellness today?  Managing health    Goals Addressed             This Visit's Progress    COMPLETED: Diabetes Management       Patient Goals/Self Care Activities: -Patient/Caregiver will self-administer medications as prescribed as evidenced by self-report/primary caregiver report  -Patient/Caregiver will attend all scheduled provider appointments as evidenced by clinician review of documented attendance to scheduled appointments and patient/caregiver report -Patient/Caregiver will call provider office for new concerns or questions as evidenced by review of documented incoming telephone call notes and patient report  -check blood sugar at prescribed times -check blood sugar if I feel it is too high or too low -record values and write them down take them to all doctor visits    Patient reports she is good.  She states her blood sugars are running around 80.  Discussed hypoglycemia.  Encouraged continued diabetes management.  She verbalized understanding.  No concerns.  Discussed case closure as she is doing well with diabetes management.  She is agreeable.          SDOH assessments and interventions completed:  Yes     Care Coordination Interventions:  Yes, provided   Follow up plan: No further intervention required.   Encounter Outcome:  Patient Visit Completed   Bary Leriche RN, MSN [facility], [facility] RN Care Manager Direct Dial: [phone]  Fax: [phone] Website: [email or web address]

## 2023-11-13 ENCOUNTER — Other Ambulatory Visit: Payer: Self-pay

## 2023-11-13 ENCOUNTER — Telehealth: Payer: Self-pay | Admitting: Podiatry

## 2023-11-13 MED ORDER — DOXYCYCLINE HYCLATE 100 MG PO TABS: 100.0000 mg | ORAL_TABLET | Freq: Two times a day (BID) | ORAL | 0 refills | Status: DC

## 2023-11-13 NOTE — Telephone Encounter (Signed)
Notified pt medication was sent in and she said thank you so much.

## 2023-11-13 NOTE — Telephone Encounter (Signed)
 Pt had ingrown procedure on 3/4 and her doxycycline medication a few fell down the sink drain. She has like 1 left. Can she please get a refill sent in. Pharmacy is correct in chart.

## 2023-11-15 ENCOUNTER — Encounter: Payer: Self-pay | Admitting: Podiatry

## 2023-11-15 ENCOUNTER — Telehealth: Payer: Self-pay | Admitting: Podiatry

## 2023-11-15 NOTE — Telephone Encounter (Signed)
 Notified pt letter was written and she would like to pick up at front desk. I told her I would print and take down for her to pick it up. She said thank you.

## 2023-11-15 NOTE — Telephone Encounter (Signed)
 Pt called asking if she could get another work note because she is still not able to wear a shoe from having the ingrown procedure on 3/4. She would like to go back next week She has a follow up appt on 3/18 with you. Please advise.

## 2023-11-16 ENCOUNTER — Encounter: Payer: Self-pay | Admitting: Physician Assistant

## 2023-11-16 ENCOUNTER — Ambulatory Visit: Payer: Self-pay | Admitting: Family Medicine

## 2023-11-16 ENCOUNTER — Ambulatory Visit (INDEPENDENT_AMBULATORY_CARE_PROVIDER_SITE_OTHER): Admitting: Physician Assistant

## 2023-11-16 ENCOUNTER — Ambulatory Visit: Admitting: Physician Assistant

## 2023-11-16 VITALS — BP 120/57 | HR 66 | Temp 98.6°F | Ht 64.0 in | Wt 179.6 lb

## 2023-11-16 DIAGNOSIS — L03031 Cellulitis of right toe: Secondary | ICD-10-CM | POA: Diagnosis not present

## 2023-11-16 MED ORDER — CLINDAMYCIN HCL 300 MG PO CAPS
300.0000 mg | ORAL_CAPSULE | Freq: Three times a day (TID) | ORAL | 0 refills | Status: DC
Start: 2023-11-16 — End: 2023-11-21

## 2023-11-16 MED ORDER — TRAMADOL HCL 50 MG PO TABS
50.0000 mg | ORAL_TABLET | Freq: Three times a day (TID) | ORAL | 0 refills | Status: AC | PRN
Start: 2023-11-16 — End: 2023-11-21

## 2023-11-16 NOTE — Telephone Encounter (Signed)
  Chief Complaint: infected toe Symptoms: redness, swelling Frequency: ongoing since procedure Pertinent Negatives: Patient denies fever but has chills Disposition: [] ED /[] Urgent Care (no appt availability in office) / [x] Appointment(In office/virtual)/ []  [facility]/ [] Home Care/ [] Refused Recommended Disposition /[] [facility]/ []  Follow-up with PCP Additional Notes: Patient calling in requesting an acute visit for infection symptoms. She had an "ingrown nail procedure done" in office on 11/07/23, with follow up appointment 11/21/23 but she "cannot wait". Her toe is red, swollen and painful. She has just one  dose of antibiotic remaining and she is worried as she has a history of MRSA. Experiencing chills since last evening. Requesting follow up visit today. Scheduled acute visit with another provider today since PCP does not have available appointments today. This Clinical research associate did not cancel 2 week follow up scheduled for 11/21/23 in case provider would like recheck.    Copied from CRM [id number]. Topic: Clinical - Red Word Triage >> Nov 16, 2023  2:04 PM [name] wrote: Red Word that prompted transfer to Nurse Triage: Big right toe infected - swollen, purple/red and very painful Reason for Disposition  Entire toe is red  Answer Assessment - Initial Assessment Questions 1. LOCATION: "Which toe?"      Right foot great toe 2. APPEARANCE: "What does it look like?"      Red, swelling 3. ONSET: "When did it start?"      After ingrown nail removal 4. PAIN: "Is there any pain?" If Yes, ask: "How bad is the pain?"   (Scale 1-10; or mild, moderate, severe)    - NONE (0): none     - MILD (1-3): doesn't interfere with normal activities    - MODERATE (4-7): interferes with normal activities or awakens from sleep    - SEVERE (8-10): excruciating pain, unable to do any normal activities     Severe 5. REDNESS: "Is there any redness of the skin?" If Yes, ask: "How much of the toe is  red?"     Yes 6. OTHER SYMPTOMS: "Do you have any other symptoms?" (e.g., chills, fever, red streak up foot)     Chills  Protocols used: Toenail - Ingrown-A-AH

## 2023-11-16 NOTE — Progress Notes (Signed)
 Established patient visit   Patient: [name]   DOB: 07-11-1971   53 y.o. Female  MRN: [id number] Visit Date: 11/16/2023  Today's healthcare provider: Alfredia Ferguson, PA-C   Chief Complaint  Patient presents with   Toe Pain    Big toe on R foot  Procedure was done on 11/07/2023 Pt is currently taking Doxycycline, pt state she has "one more"   Subjective     Pt was seen by podiatry 11/07/23, right big toe w/ ingrown toenail, she had a procedure to remove. She was started on diflucan and doxycycline.  Today, she reports worsening pain, redness, swelling and discharge of her right big toe. One more pill of doxycycline.  Medications: Outpatient Medications Prior to Visit  Medication Sig   ALPRAZolam (XANAX) 0.5 MG tablet Take 0.5 mg by mouth 3 (three) times daily as needed for anxiety.   ARIPiprazole (ABILIFY) 5 MG tablet Take 1 tablet (5 mg total) by mouth daily.   aspirin-acetaminophen-caffeine (EXCEDRIN MIGRAINE) 250-250-65 MG tablet Take 1 tablet by mouth every 6 (six) hours as needed for migraine.   atorvastatin (LIPITOR) 20 MG tablet TAKE 1 TABLET(20 MG) BY MOUTH DAILY   blood glucose meter kit and supplies KIT Dispense based on patient and insurance preference. Check glucose before breakfast. ICD: E11.65   diclofenac Sodium (VOLTAREN) 1 % GEL Apply 2 g topically 4 (four) times daily.   doxycycline (VIBRA-TABS) 100 MG tablet Take 1 tablet (100 mg total) by mouth 2 (two) times daily.   FINACEA 15 % FOAM Apply 1 application  topically 2 (two) times daily as needed (rosacea).   gabapentin (NEURONTIN) 300 MG capsule TAKE ONE CAPSULE BY MOUTH THREE TIMES A DAY   glucose blood test strip Use as instructed, check glucose before breakfast. ICD: E11.65   lamoTRIgine (LAMICTAL) 150 MG tablet Take 300 mg by mouth daily.   Lancets (FREESTYLE) lancets Use as instructed. E11.65   levothyroxine (SYNTHROID) 75 MCG tablet Take 1 tablet (75 mcg total) by mouth daily before breakfast.    lidocaine (XYLOCAINE) 2 % solution Use as directed 15 mLs in the mouth or throat every 3 (three) hours as needed for mouth pain. Swish and spit.   methylphenidate (RITALIN) 20 MG tablet Take 20 mg by mouth 2 (two) times daily.   pantoprazole (PROTONIX) 40 MG tablet Take 1 tablet (40 mg total) by mouth daily.   polyethylene glycol powder (GLYCOLAX/MIRALAX) 17 GM/SCOOP powder Take 17 g by mouth daily.   Semaglutide,0.25 or 0.5MG /DOS, (OZEMPIC, 0.25 OR 0.5 MG/DOSE,) 2 MG/3ML SOPN Inject 0.25 mg into the skin once a week.   zolpidem (AMBIEN) 5 MG tablet Take 5 mg by mouth at bedtime as needed for sleep (Crystal Monatnew).   No facility-administered medications prior to visit.    Review of Systems  Constitutional:  Negative for fatigue and fever.  Respiratory:  Negative for cough and shortness of breath.   Cardiovascular:  Negative for chest pain and leg swelling.  Gastrointestinal:  Negative for abdominal pain.  Neurological:  Negative for dizziness and headaches.       Objective    BP (!) 120/57 (BP Location: Left Arm, Patient Position: Sitting, Cuff Size: Normal)   Pulse 66   Temp 98.6 F (37 C) (Oral)   Ht 5\' 4"  (1.626 m)   Wt 179 lb 9.6 oz (81.5 kg)   SpO2 98%   BMI 30.83 kg/m    Physical Exam Vitals reviewed.  Constitutional:  Appearance: She is not ill-appearing.  HENT:     Head: Normocephalic.  Eyes:     Conjunctiva/sclera: Conjunctivae normal.  Cardiovascular:     Rate and Rhythm: Normal rate.  Pulmonary:     Effort: Pulmonary effort is normal. No respiratory distress.  Feet:     Comments: Right bit toe with erythema, edema, tenderness, discharge in the nailbed Neurological:     Mental Status: She is alert and oriented to person, place, and time.  Psychiatric:        Mood and Affect: Mood normal.        Behavior: Behavior normal.     No results found for any visits on 11/16/23.  Assessment & Plan    Cellulitis of right toe -     traMADol HCl; Take  1 tablet (50 mg total) by mouth every 8 (eight) hours as needed for up to 5 days.  Dispense: 15 tablet; Refill: 0 -     Clindamycin HCl; Take 1 capsule (300 mg total) by mouth 3 (three) times daily for 7 days.  Dispense: 21 capsule; Refill: 0 -     WOUND CULTURE   Cultured discharge. Rx clindamycin, tramadol for pain. Recommending keep covered, clean and dry.  Encouraged early f/u with podiatry.  Return if symptoms worsen or fail to improve.       Alfredia Ferguson, PA-C  [facility] at [facility] [phone] (phone) [phone] (fax)  [facility]

## 2023-11-19 LAB — WOUND CULTURE
MICRO NUMBER:: 16197606
SPECIMEN QUALITY:: ADEQUATE

## 2023-11-20 ENCOUNTER — Telehealth: Payer: Self-pay | Admitting: Family Medicine

## 2023-11-20 ENCOUNTER — Other Ambulatory Visit: Payer: Self-pay | Admitting: Podiatry

## 2023-11-20 ENCOUNTER — Encounter: Payer: Self-pay | Admitting: Podiatry

## 2023-11-20 DIAGNOSIS — N644 Mastodynia: Secondary | ICD-10-CM

## 2023-11-20 NOTE — Telephone Encounter (Signed)
 Will need orders for diagnostic MM and [place]   Copied from CRM [id number]. Topic: Clinical - Request for Lab/Test Order >> Nov 20, 2023 10:43 AM Lorin Glass B wrote: Reason for CRM: Patient states that she attempted to schedule a mammogram due to right breast pain. Stated that the office did not allow her to schedule without first having a diagnostic mammogram ultrasound order sent from her PCP. States that the order can be faxed [facility]. Fax #[phone] Patients Callback #[phone]

## 2023-11-21 ENCOUNTER — Ambulatory Visit: Admitting: Podiatry

## 2023-11-21 ENCOUNTER — Encounter: Payer: Self-pay | Admitting: Podiatry

## 2023-11-21 DIAGNOSIS — L03031 Cellulitis of right toe: Secondary | ICD-10-CM | POA: Diagnosis not present

## 2023-11-21 DIAGNOSIS — L600 Ingrowing nail: Secondary | ICD-10-CM | POA: Diagnosis not present

## 2023-11-21 MED ORDER — CEPHALEXIN 500 MG PO CAPS: 500.0000 mg | ORAL_CAPSULE | Freq: Three times a day (TID) | ORAL | 0 refills | Status: DC

## 2023-11-21 MED ORDER — MUPIROCIN 2 % EX OINT: 1.0000 | TOPICAL_OINTMENT | Freq: Two times a day (BID) | CUTANEOUS | 2 refills | Status: DC

## 2023-11-21 NOTE — Progress Notes (Unsigned)
 Subjective: Chief Complaint  Patient presents with   Ingrown Toenail    F/u for ingrown toe nail and nail culture results   53 year old female presents as a follow-up evaluation after partial nail avulsion.  States that she is having quite a bit of pain with it.  She was having some drainage she sees her PCP and was prescribed clindamycin.  She was also taking tramadol for pain.  No recent injuries.  She is unable to wear shoe.  Objective: AAO x3, NAD DP/PT pulses palpable bilaterally, CRT less than 3 seconds Status post partial nail avulsion of the hallux.  There is crenation tissues are present on nail fold with localized edema and erythema on the proximal nail fold but there is no ascending cellulitis.  There is no drainage or pus or any fluctuation or crepitation.  There is no malodor.  Tenderness palpation of the procedure site. No pain with calf compression, swelling, warmth, erythema  Assessment: Status post first nail avulsion with localized erythema  Plan: -All treatment options discussed with the patient including all alternatives, risks, complications.  -I encouraged to continue soaking Epsom salt soaks twice a day cover with antibiotic ointment.  Will switch to using mupirocin as ordered.  Also will switch to cephalexin which was ordered today.  Surgical shoe dispensed for offloading. -Discussed that once the drainage completely healed although not able to appreciate any purulence or drainage today she can leave the area open at nighttime. -Patient encouraged to call the office with any questions, concerns, change in symptoms.   [name] DPM

## 2023-11-22 ENCOUNTER — Ambulatory Visit: Admitting: Orthopaedic Surgery

## 2023-11-22 ENCOUNTER — Other Ambulatory Visit: Payer: Self-pay

## 2023-11-22 ENCOUNTER — Other Ambulatory Visit (INDEPENDENT_AMBULATORY_CARE_PROVIDER_SITE_OTHER): Payer: Self-pay

## 2023-11-22 ENCOUNTER — Encounter: Payer: Self-pay | Admitting: Orthopaedic Surgery

## 2023-11-22 DIAGNOSIS — M25551 Pain in right hip: Secondary | ICD-10-CM | POA: Diagnosis not present

## 2023-11-22 DIAGNOSIS — Z96642 Presence of left artificial hip joint: Secondary | ICD-10-CM | POA: Diagnosis not present

## 2023-11-22 NOTE — Progress Notes (Signed)
 Office Visit Note   Patient: [name]           Date of Birth: October 07, 1970           MRN: [id number] Visit Date: 11/22/2023              Requested by:  Mliss Sax, MD [address] [place],  [place] [address]  PCP: Mliss Sax, MD   Assessment & Plan: Visit Diagnoses:  1. History of total hip replacement, left   2. Pain in right hip     Plan: [name] is a 53 year old female with bilateral trochanteric hip pain.  The right hip is mildly arthritic in the left hip arthroplasty looks good.  Pain is only present at night when she sleeps on it.  It is not severe enough to warrant any medications or injections.  I will provide some home exercises for her.  Will see her back as needed.  Follow-Up Instructions: No follow-ups on file.   Orders:  Orders Placed This Encounter  Procedures   XR HIP UNILAT W OR W/O PELVIS 2-3 VIEWS LEFT   XR HIP UNILAT W OR W/O PELVIS 2-3 VIEWS RIGHT   No orders of the defined types were placed in this encounter.     Procedures: No procedures performed   Clinical Data: No additional findings.   Subjective: Chief Complaint  Patient presents with   Left Hip - Follow-up    Left total hip arthroplasty 10/12/2020   Right Hip - Pain    HPI [name] is a 53 year old who comes in for bilateral hip pain.  Denies any groin pain.  She reports lateral hip pain to both sides.  She has lost a lot of weight.  She only has pain when she lays on her side.  Denies any radicular symptoms.  Overall she states that she is doing well and happy with how her hips feel. Review of Systems  Constitutional: Negative.   HENT: Negative.    Eyes: Negative.   Respiratory: Negative.    Cardiovascular: Negative.   Endocrine: Negative.   Musculoskeletal: Negative.   Neurological: Negative.   Hematological: Negative.   Psychiatric/Behavioral: Negative.    All other systems reviewed and are negative.    Objective: Vital Signs: There were no  vitals taken for this visit.  Physical Exam Vitals and nursing note reviewed.  Constitutional:      Appearance: She is well-developed.  HENT:     Head: Normocephalic and atraumatic.  Pulmonary:     Effort: Pulmonary effort is normal.  Abdominal:     Palpations: Abdomen is soft.  Musculoskeletal:     Cervical back: Neck supple.  Skin:    General: Skin is warm.     Capillary Refill: Capillary refill takes less than 2 seconds.  Neurological:     Mental Status: She is alert and oriented to person, place, and time.  Psychiatric:        Behavior: Behavior normal.        Thought Content: Thought content normal.        Judgment: Judgment normal.     Ortho Exam Exam of bilateral hips show slight tenderness to the trochanteric region.  Fluid painless range of motion.  Fully healed surgical scar over the left hip. Specialty Comments:  No specialty comments available.  Imaging: XR HIP UNILAT W OR W/O PELVIS 2-3 VIEWS RIGHT Result Date: 11/22/2023 X-rays of the right hip show mild arthritic changes of the hip  joint.  No acute abnormalities.  XR HIP UNILAT W OR W/O PELVIS 2-3 VIEWS LEFT Result Date: 11/22/2023 X-rays of the left hip show a stable left total hip arthroplasty with dual mobility bearing without any complications.    PMFS History: Patient Active Problem List   Diagnosis Date Noted   Overweight (BMI 25.0-29.9) 11/06/2023   Inclusion cyst 02/23/2023   Hypotension 01/02/2023   Transportation insecurity 12/21/2022   Dehydration 12/21/2022   Menopause 12/21/2022   Abscess 03/30/2022   Amenorrhea 10/29/2021   Abnormal findings on diagnostic imaging of liver and biliary tract 10/13/2021   Colon cancer screening 10/13/2021   Constipation 10/13/2021   Gallstones, common bile duct 10/13/2021   Gastroesophageal reflux disease 10/13/2021   History of hepatitis C 10/13/2021   Intestinal gas excretion 10/13/2021   Liver fibrosis 10/13/2021   Right upper quadrant pain  10/13/2021   Stress at home 06/21/2021   Need for influenza vaccination 06/21/2021   Periorbital hyperpigmentation 05/03/2021   Abnormal CBC 05/03/2021   Lower respiratory infection 02/04/2021   Hematuria 12/28/2020   Status post total replacement of left hip 10/12/2020   Peripheral edema 04/02/2020   COVID-19 07/09/2019   Left hip pain 04/30/2019   Otitis externa of left ear 04/30/2019   Cholesteatoma of left ear 04/30/2019   Dysfunction of left eustachian tube 04/30/2019   Left ear pain 04/30/2019   Sciatica 04/19/2019   Class 3 severe obesity due to excess calories with serious comorbidity and body mass index (BMI) of 45.0 to 49.9 in adult (HCC) 04/19/2019   Post-nasal drip 01/10/2019   Allergic cough 01/10/2019   Obstructive sleep apnea syndrome 12/11/2018   Acute frontal sinusitis 12/10/2018   Primary osteoarthritis of left hip 11/13/2018   Morbid obesity (HCC) 11/13/2018   Elevated cholesterol 11/12/2018   Essential hypertension 11/12/2018   Allergic rhinitis due to pollen 11/12/2018   Reactive airway disease 11/12/2018   Breast pain, right 07/17/2018   Refused influenza vaccine 07/17/2018   Chronic nonintractable headache 02/12/2018   Dysuria 02/12/2018   Localized edema 02/12/2018   Hospital discharge follow-up 01/23/2018   Bladder problem 01/05/2018   Depression 01/05/2018   Insomnia 01/05/2018   Hepatitis C infection 12/01/2015   RLS (restless legs syndrome) 10/23/2015   Elevated liver enzymes 10/22/2015   Recurrent major depression-severe (HCC) 04/17/2015   GAD (generalized anxiety disorder) 04/15/2015   Bipolar depression (HCC) 04/15/2015   Severe recurrent major depression w/psychotic features, mood-congruent (HCC) 04/14/2015   Anxiety disorder 04/14/2015   Major depressive disorder, recurrent severe without psychotic features (HCC) 04/14/2015   Suicidal ideations    Chronic low back pain 01/02/2015   Adult hypothyroidism 06/25/2012   B12 deficiency  06/25/2012   Other fatigue 06/25/2012   Chronic arthralgias of knees and hips 06/25/2012   Leukocytosis 06/25/2012   Vitamin D deficiency 06/25/2012   Past Medical History:  Diagnosis Date   ADHD (attention deficit hyperactivity disorder)    Anemia    with pregnancy   Anxiety    Arthritis    Bipolar disorder (HCC)    Chronic back pain greater than 3 months duration    Depression    Diabetes mellitus without complication (HCC)    Diabetic nephropathy (HCC)    bilateral feet   Dysuria    has to have self in and out cath    Eroded bladder suspension mesh (HCC)    GERD (gastroesophageal reflux disease)    Hepatitis    C   History of  COVID-19 11/2019   History of MRSA infection    after back surgery   History of sleep apnea    resolved after weight loss   Hypertension    Hypothyroidism    Leg pain, bilateral    Obesity    Obsessive-compulsive disorder    Pneumonia 12/24/2019   Rosacea    Sciatic nerve pain    Social anxiety disorder    Squamous cell carcinoma of back 2021    Family History  Problem Relation Age of Onset   Schizophrenia Father    Schizophrenia Cousin    Hyperthyroidism Mother    Hypothyroidism Maternal Grandmother     Past Surgical History:  Procedure Laterality Date   BACK SURGERY     total of 6 back surgeries   bladder mesh  2009   DILITATION & CURRETTAGE/HYSTROSCOPY WITH NOVASURE ABLATION N/A 08/19/2016   Procedure: DILATATION & CURETTAGE WITH NOVASURE ABLATION;  Surgeon: Ilda Mori, MD;  Location: [facility];  Service: Gynecology;  Laterality: N/A;   EUS N/A 09/09/2015   Procedure: UPPER ENDOSCOPIC ULTRASOUND (EUS) RADIAL;  Surgeon: Willis Modena, MD;  Location: [facility];  Service: Endoscopy;  Laterality: N/A;   EYE SURGERY     INCONTINENCE SURGERY     SPINAL CORD STIMULATOR INSERTION     SPINAL CORD STIMULATOR REMOVAL     SPINAL FUSION  2008   times two   TOTAL HIP ARTHROPLASTY Left 10/12/2020   Procedure: LEFT TOTAL HIP ARTHROPLASTY  ANTERIOR APPROACH;  Surgeon: Tarry Kos, MD;  Location: [facility];  Service: Orthopedics;  Laterality: Left;   TUBAL LIGATION  2006   Social History   Occupational History   Occupation: disability  Tobacco Use   Smoking status: Former    Current packs/day: 0.00    Average packs/day: 1 pack/day for 15.0 years (15.0 ttl pk-yrs)    Types: Cigarettes    Start date: 10/04/2006    Quit date: 10/04/2021    Years since quitting: 2.1   Smokeless tobacco: Former   Tobacco comments:    use vapor, social  Vaping Use   Vaping status: Never Used  Substance and Sexual Activity   Alcohol use: Yes    Comment: occ   Drug use: No   Sexual activity: Yes    Birth control/protection: Surgical

## 2023-11-24 ENCOUNTER — Other Ambulatory Visit: Payer: Self-pay | Admitting: Family Medicine

## 2023-11-24 DIAGNOSIS — N644 Mastodynia: Secondary | ICD-10-CM

## 2023-11-25 ENCOUNTER — Emergency Department (HOSPITAL_BASED_OUTPATIENT_CLINIC_OR_DEPARTMENT_OTHER)
Admission: EM | Admit: 2023-11-25 | Discharge: 2023-11-25 | Disposition: A | Discharge status: Home / Self Care | Attending: Emergency Medicine | Admitting: Emergency Medicine

## 2023-11-25 ENCOUNTER — Emergency Department (HOSPITAL_BASED_OUTPATIENT_CLINIC_OR_DEPARTMENT_OTHER): Admitting: Radiology

## 2023-11-25 ENCOUNTER — Encounter (HOSPITAL_BASED_OUTPATIENT_CLINIC_OR_DEPARTMENT_OTHER): Payer: Self-pay | Admitting: Emergency Medicine

## 2023-11-25 DIAGNOSIS — L03031 Cellulitis of right toe: Secondary | ICD-10-CM | POA: Diagnosis not present

## 2023-11-25 DIAGNOSIS — E119 Type 2 diabetes mellitus without complications: Secondary | ICD-10-CM | POA: Diagnosis not present

## 2023-11-25 DIAGNOSIS — Z87891 Personal history of nicotine dependence: Secondary | ICD-10-CM | POA: Insufficient documentation

## 2023-11-25 DIAGNOSIS — M79674 Pain in right toe(s): Secondary | ICD-10-CM | POA: Diagnosis not present

## 2023-11-25 DIAGNOSIS — M7989 Other specified soft tissue disorders: Secondary | ICD-10-CM | POA: Diagnosis not present

## 2023-11-25 DIAGNOSIS — Z7982 Long term (current) use of aspirin: Secondary | ICD-10-CM | POA: Insufficient documentation

## 2023-11-25 MED ORDER — LIDOCAINE HCL (PF) 1 % IJ SOLN
5.0000 mL | Freq: Once | INTRAMUSCULAR | Status: AC
  Administered 2023-11-25: 5 mL
  Filled 2023-11-25: qty 5

## 2023-11-25 NOTE — ED Provider Notes (Signed)
 [place] EMERGENCY DEPARTMENT AT [facility] Provider Note   CSN: [id number] Arrival date & time: 11/25/23  1005     History  Chief Complaint  Patient presents with   Toe Pain    [name] is a 53 y.o. female.  HPI   53 year old female presents emergency department with complaints of right great toe pain.  States that she has been having pain for the past 3 weeks or so.  Has been seen by podiatry in the outpatient setting regarding ingrown toenail.  States that she had both sides of her toenail removed and has been on 2-3 different antibiotics.  States she has been doing Epsom soaks at home without improvement of symptoms.  States the pain worsened acutely last night prompting visit to the emergency department today.  States that she has been getting some white drainage from the right side of her nailbed.  Denies any fevers, chills, weakness/sensory deficits.  Denies any trauma to affected digit.  Past medical history significant for diabetic nephropathy, diabetes mellitus, GERD, sciatica, OCD, bipolar disorder, obesity,  Home Medications Prior to Admission medications   Medication Sig Start Date End Date Taking? Authorizing Provider  ALPRAZolam Prudy Feeler) 0.5 MG tablet Take 0.5 mg by mouth 3 (three) times daily as needed for anxiety. 12/20/19   [provider]  ARIPiprazole (ABILIFY) 5 MG tablet Take 1 tablet (5 mg total) by mouth daily. 09/17/15   Benjaman Pott, MD  aspirin-acetaminophen-caffeine (EXCEDRIN MIGRAINE) [phone] MG tablet Take 1 tablet by mouth every 6 (six) hours as needed for migraine.    [provider]  atorvastatin (LIPITOR) 20 MG tablet TAKE 1 TABLET(20 MG) BY MOUTH DAILY 07/13/23   Mliss Sax, MD  blood glucose meter kit and supplies KIT Dispense based on patient and insurance preference. Check glucose before breakfast. ICD: E11.65 04/02/20   Nche, Bonna Gains, NP  cephALEXin (KEFLEX) 500 MG capsule Take 1 capsule (500  mg total) by mouth 3 (three) times daily. 11/21/23   Vivi Barrack, DPM  diclofenac Sodium (VOLTAREN) 1 % GEL Apply 2 g topically 4 (four) times daily. 08/15/23   Mliss Sax, MD  FINACEA 15 % FOAM Apply 1 application  topically 2 (two) times daily as needed (rosacea). 02/14/20   [provider]  gabapentin (NEURONTIN) 300 MG capsule TAKE ONE CAPSULE BY MOUTH THREE TIMES A DAY 10/23/23   Mliss Sax, MD  glucose blood test strip Use as instructed, check glucose before breakfast. ICD: E11.65 07/21/20   Mliss Sax, MD  lamoTRIgine (LAMICTAL) 150 MG tablet Take 300 mg by mouth daily. 02/22/20   [provider]  Lancets (FREESTYLE) lancets Use as instructed. E11.65 07/21/20   Mliss Sax, MD  levothyroxine (SYNTHROID) 75 MCG tablet Take 1 tablet (75 mcg total) by mouth daily before breakfast. 07/13/23   Mliss Sax, MD  lidocaine (XYLOCAINE) 2 % solution Use as directed 15 mLs in the mouth or throat every 3 (three) hours as needed for mouth pain. Swish and spit. 05/14/23   Blitch, Linde Gillis, NP  methylphenidate (RITALIN) 20 MG tablet Take 20 mg by mouth 2 (two) times daily. 09/22/20   [provider]  mupirocin ointment (BACTROBAN) 2 % Apply 1 Application topically 2 (two) times daily. 11/21/23   Vivi Barrack, DPM  pantoprazole (PROTONIX) 40 MG tablet Take 1 tablet (40 mg total) by mouth daily. 07/13/23   Mliss Sax, MD  polyethylene glycol powder [facility]) 17  GM/SCOOP powder Take 17 g by mouth daily. 11/30/22   Nche, Bonna Gains, NP  Semaglutide,0.25 or 0.5MG /DOS, (OZEMPIC, 0.25 OR 0.5 MG/DOSE,) 2 MG/3ML SOPN Inject 0.25 mg into the skin once a week. 07/13/23   Mliss Sax, MD  zolpidem (AMBIEN) 5 MG tablet Take 5 mg by mouth at bedtime as needed for sleep (Crystal Monatnew).    [provider]      Allergies    Pregabalin, Bupropion hcl, Celecoxib, Effexor [venlafaxine hcl],  Oxybutynin, Pramipexole, Pramipexole dihydrochloride, Pregabalin, Prozac [fluoxetine hcl], Symbyax [olanzapine-fluoxetine hcl], Venlafaxine, and Desvenlafaxine    Review of Systems   Review of Systems  All other systems reviewed and are negative.   Physical Exam Updated Vital Signs BP 118/73 (BP Location: Right Arm)   Pulse 66   Temp 98.5 F (36.9 C) (Oral)   Resp 16   SpO2 99%  Physical Exam Vitals and nursing note reviewed.  Constitutional:      General: She is not in acute distress.    Appearance: She is well-developed.  HENT:     Head: Normocephalic and atraumatic.  Eyes:     Conjunctiva/sclera: Conjunctivae normal.  Cardiovascular:     Rate and Rhythm: Normal rate and regular rhythm.     Heart sounds: No murmur heard. Pulmonary:     Effort: Pulmonary effort is normal. No respiratory distress.     Breath sounds: Normal breath sounds.  Abdominal:     Palpations: Abdomen is soft.     Tenderness: There is no abdominal tenderness.  Musculoskeletal:        General: No swelling.     Cervical back: Neck supple.     Comments: Pedal and posterior tibial pulses 2+ bilaterally.  Patient with erythema appreciated the base of right great toe nail.  Tenderness most so lateral aspect of nailbed.  Bedside ultrasound did show evidence of fluid collection inside area.  Cap refill less than 2 seconds.  Full range of motion of affected digit.  Skin:    General: Skin is warm and dry.     Capillary Refill: Capillary refill takes less than 2 seconds.  Neurological:     Mental Status: She is alert.  Psychiatric:        Mood and Affect: Mood normal.     ED Results / Procedures / Treatments   Labs (all labs ordered are listed, but only abnormal results are displayed) Labs Reviewed - No data to display  EKG None  Radiology DG Toe Great Right Result Date: 11/25/2023 CLINICAL DATA:  Discolored toenail and right great toe, pain. EXAM: RIGHT GREAT TOE COMPARISON:  None Available.  FINDINGS: Soft tissue swelling involving the first digit. No underlying cortical erosion or radiopaque foreign body. IMPRESSION: First toe soft tissue swelling. No underlying osseous abnormality or radiopaque foreign body. Electronically Signed   By: Leanna Battles M.D.   On: 11/25/2023 11:07    Procedures .Incision and Drainage  Date/Time: 11/25/2023 10:48 AM  Performed by: Peter Garter, PA Authorized by: Peter Garter, PA   Consent:    Consent obtained:  Verbal   Consent given by:  Patient   Risks discussed:  Bleeding, incomplete drainage, pain and damage to other organs   Alternatives discussed:  No treatment Universal protocol:    Procedure explained and questions answered to patient or proxy's satisfaction: yes     Relevant documents present and verified: yes     Test results available : yes     Imaging  studies available: yes     Required blood products, implants, devices, and special equipment available: yes     Site/side marked: yes     Immediately prior to procedure, a time out was called: yes     Patient identity confirmed:  Verbally with patient Location:    Type:  Abscess   Size:  5 mm in diameter   Location:  Lower extremity   Lower extremity location:  Toe   Toe location:  R big toe Pre-procedure details:    Skin preparation:  Betadine Anesthesia:    Anesthesia method:  Local infiltration   Local anesthetic:  Lidocaine 1% w/o epi Procedure type:    Complexity:  Simple Procedure details:    Incision types:  Stab incision   Incision depth:  Subcutaneous   Wound management:  Probed and deloculated, irrigated with saline and extensive cleaning   Drainage:  Purulent   Drainage amount:  Scant   Wound treatment:  Wound left open   Packing materials:  None Post-procedure details:    Procedure completion:  Tolerated well, no immediate complications     Medications Ordered in ED Medications  lidocaine (PF) (XYLOCAINE) 1 % injection 5 mL (5 mLs Other  Given by Other 11/25/23 1050)    ED Course/ Medical Decision Making/ A&P                                 Medical Decision Making Amount and/or Complexity of Data Reviewed Radiology: ordered.  Risk Prescription drug management.   This patient presents to the ED for concern of toe pain and, this involves an extensive number of treatment options, and is a complaint that carries with it a high risk of complications and morbidity.  The differential diagnosis includes fracture, strain/sprain, dislocation, paronychia, felon, ischemic limb, gout, arthritis, other   Co morbidities that complicate the patient evaluation  See HPI   Additional history obtained:  Additional history obtained from EMR External records from outside source obtained and reviewed including hospital records   Lab Tests:  N/a   Imaging Studies ordered:  I ordered imaging studies including right toe x-ray I independently visualized and interpreted imaging which showed no osseous abnormality I agree with the radiologist interpretation   Cardiac Monitoring: / EKG:  The patient was maintained on a cardiac monitor.  I personally viewed and interpreted the cardiac monitored which showed an underlying rhythm of: sinus rhythm   Consultations Obtained:  N/a   Problem List / ED Course / Critical interventions / Medication management  Paronychia I ordered medication including lidocaine   Reevaluation of the patient after these medicines showed that the patient improved I have reviewed the patients home medicines and have made adjustments as needed   Social Determinants of Health:  Former cigarette use.  Denies illicit drug use.   Test / Admission - Considered:  Paronychia Vitals signs within normal range and stable throughout visit. Laboratory/imaging studies significant for: See above 53 year old female presents emergency department with complaints of right great toe pain.  States that she has  been having pain for the past 3 weeks or so.  Has been seen by podiatry in the outpatient setting regarding ingrown toenail.  States that she had both sides of her toenail removed and has been on 2-3 different antibiotics.  States she has been doing Epsom soaks at home without improvement of symptoms.  States the pain worsened acutely last night  prompting visit to the emergency department today.  States that she has been getting some white drainage from the right side of her nailbed.  Denies any fevers, chills, weakness/sensory deficits.  Denies any trauma to affected digit. On exam, swelling as well as erythema appreciated the base of right great toe nail with lateral greater than medial.  Bedside ultrasound did show evidence of fluid collection base of lateral nailbed consistent with paronychia.  Shared decision made conversation was had regarding further nail resection on the lateral aspect given that she has history of ingrown toenails and currently with paronychia versus solely drainage of the paronychia and patient elected for the latter option.  Area incised and drained in manner as above.  Patient already on antibiotics through podiatry.  Recommend continued wound care at home and follow-up with podiatry in the outpatient setting.  Treatment plan discussed at length with patient and she acknowledged understanding was agreeable to said plan.  Patient overall well-appearing, afebrile in no acute distress. Worrisome signs and symptoms were discussed with the patient, and the patient acknowledged understanding to return to the ED if noticed. Patient was stable upon discharge.          Final Clinical Impression(s) / ED Diagnoses Final diagnoses:  None    Rx / DC Orders ED Discharge Orders     None         Peter Garter, [place] 11/25/23 1120    Ernie Avena, MD 11/25/23 1210

## 2023-11-25 NOTE — ED Triage Notes (Signed)
 Pt c/o RT great toe pain after removal of ingrown nail ~ 3 weeks pta. No d/c noted. Pt reports taking 3 different abx, doxy, cephalexin and mupirocin. Denies fever

## 2023-11-25 NOTE — Discharge Instructions (Signed)
 As discussed, x-ray appeared normal from a bony perspective.  You did have a small abscess that was drained while in the emergency department.  Continue take antibiotics as prescribed by podiatry.  Continue to do Epsom soaks to help aid in drainage.  Please do not hesitate to return if the worrisome signs and symptoms we discussed become apparent.

## 2023-12-01 ENCOUNTER — Other Ambulatory Visit: Payer: Self-pay | Admitting: Family Medicine

## 2023-12-01 DIAGNOSIS — E1165 Type 2 diabetes mellitus with hyperglycemia: Secondary | ICD-10-CM

## 2023-12-01 MED ORDER — OZEMPIC (0.25 OR 0.5 MG/DOSE) 2 MG/3ML ~~LOC~~ SOPN: 0.2500 mg | PEN_INJECTOR | SUBCUTANEOUS | 1 refills | Status: DC

## 2023-12-01 NOTE — Telephone Encounter (Signed)
 Copied from CRM [id number]. Topic: Clinical - Medication Refill >> Dec 01, 2023  9:31 AM Eunice Blase wrote: Most Recent Primary Care Visit:  Provider: Alfredia Ferguson  Department: [facility]  Visit Type: ACUTE  Date: 11/16/2023  Medication: Semaglutide,0.25 or 0.5MG /DOS, (OZEMPIC, 0.25 OR 0.5 MG/DOSE,) 2 MG/3ML SOPN  Has the patient contacted their pharmacy? Yes (Agent: If no, request that the patient contact the pharmacy for the refill. If patient does not wish to contact the pharmacy document the reason why and proceed with request.) (Agent: If yes, when and what did the pharmacy advise?)Pharmacy need PCP Approval  Is this the correct pharmacy for this prescription? Yes If no, delete pharmacy and type the correct one.  This is the patient's preferred pharmacy:  [facility] - [place], [place] - [address] [place] [address] Phone: [phone] Fax: [phone]   Has the prescription been filled recently? Yes  Is the patient out of the medication? Yes  Has the patient been seen for an appointment in the last year OR does the patient have an upcoming appointment? Yes  Can we respond through MyChart? Yes  Agent: Please be advised that Rx refills may take up to 3 business days. We ask that you follow-up with your pharmacy.

## 2023-12-04 ENCOUNTER — Ambulatory Visit (INDEPENDENT_AMBULATORY_CARE_PROVIDER_SITE_OTHER)

## 2023-12-04 ENCOUNTER — Ambulatory Visit (INDEPENDENT_AMBULATORY_CARE_PROVIDER_SITE_OTHER): Admitting: Podiatry

## 2023-12-04 ENCOUNTER — Encounter: Payer: Self-pay | Admitting: Podiatry

## 2023-12-04 DIAGNOSIS — L03031 Cellulitis of right toe: Secondary | ICD-10-CM | POA: Diagnosis not present

## 2023-12-04 DIAGNOSIS — M779 Enthesopathy, unspecified: Secondary | ICD-10-CM

## 2023-12-04 DIAGNOSIS — L02619 Cutaneous abscess of unspecified foot: Secondary | ICD-10-CM | POA: Diagnosis not present

## 2023-12-04 DIAGNOSIS — L03119 Cellulitis of unspecified part of limb: Secondary | ICD-10-CM | POA: Diagnosis not present

## 2023-12-04 DIAGNOSIS — M79671 Pain in right foot: Secondary | ICD-10-CM

## 2023-12-04 MED ORDER — MUPIROCIN 2 % EX OINT: 1.0000 | TOPICAL_OINTMENT | Freq: Two times a day (BID) | CUTANEOUS | 2 refills | Status: DC

## 2023-12-04 MED ORDER — CEPHALEXIN 500 MG PO CAPS: 500.0000 mg | ORAL_CAPSULE | Freq: Three times a day (TID) | ORAL | 0 refills | Status: DC

## 2023-12-04 MED ORDER — TRAMADOL HCL 50 MG PO TABS: 50.0000 mg | ORAL_TABLET | Freq: Three times a day (TID) | ORAL | 0 refills | Status: AC | PRN

## 2023-12-04 NOTE — Progress Notes (Signed)
 Subjective: Chief Complaint  Patient presents with   Nail Problem    RM#13 Right foot big toe nail infection patient states not doing well had to go to urgent care for treatment. Pain in big toe    53 year old female presents as a follow-up evaluation after partial nail avulsion.  States this is also her last she developed an infection in urgent care.  She states the area was drained urgent care.  There is still tenderness and prophy medication.  She states her dog she stepped on the toe. She has not been keeping a bandage on it.  No fever or chills.   Objective: AAO x3, NAD DP/PT pulses palpable bilaterally, CRT less than 3 seconds Status post partial nail avulsion of the hallux.  There is localized edema erythema mostly the lateral nail border.  There is ointment present on the toe.  There is no ascending cellulitis.  Tenderness palpation most along the lateral nail border.  No significant discomfort on the medial nail border.  No pain with calf compression, swelling, warmth, erythema  Assessment: Status post first nail avulsion with localized erythema  Plan: -All treatment options discussed with the patient including all alternatives, risks, complications.  -X-ray obtained reviewed.  3 views were obtained.  Chronic osteophyte, old avulsion fracture noted on the IPJ.  No cortical changes to suggest osteomyelitis.  Dorsal spurring present for distal phalanx. -She does not want to proceed with total nail avulsion. -Given ongoing infection and pain discussed with her incision and drainage, exploration of the nail borders.  She wished to proceed.  Consent was signed.  Clean skin with alcohol.  Mixture of 3 mL of lidocaine, Marcaine plain was infiltrated with additional 1.5 cc of lidocaine plain for additional anesthetic.  Once anesthetized prepped with her with Betadine.  Tourniquet applied.  I was then able to remove both medial, lateral nail borders.  There is no frank purulence noted.  Is able  to remove some dry, callused tissue there is no residual toenail present.  There was no purulence but I did take a culture of the lateral nail border.  I irrigated alcohol, saline.  Silvadene applied followed by dressing.  She tolerated procedure well any complications.  Recommend continue Epsom salt soaks.  And recommendation changes daily, twice a day.  Continue antibiotics.  Pain medication prescribed.  Vivi Barrack DPM    -I encouraged to continue soaking Epsom salt soaks twice a day cover with antibiotic ointment.  Will switch to using mupirocin as ordered.  Also will switch to cephalexin which was ordered today.  Surgical shoe dispensed for offloading. -Discussed that once the drainage completely healed although not able to appreciate any purulence or drainage today she can leave the area open at nighttime. -Patient encouraged to call the office with any questions, concerns, change in symptoms.   Vivi Barrack DPM

## 2023-12-04 NOTE — Patient Instructions (Signed)

## 2023-12-06 DIAGNOSIS — F429 Obsessive-compulsive disorder, unspecified: Secondary | ICD-10-CM | POA: Diagnosis not present

## 2023-12-06 DIAGNOSIS — F3132 Bipolar disorder, current episode depressed, moderate: Secondary | ICD-10-CM | POA: Diagnosis not present

## 2023-12-06 DIAGNOSIS — F909 Attention-deficit hyperactivity disorder, unspecified type: Secondary | ICD-10-CM | POA: Diagnosis not present

## 2023-12-06 DIAGNOSIS — F411 Generalized anxiety disorder: Secondary | ICD-10-CM | POA: Diagnosis not present

## 2023-12-07 ENCOUNTER — Ambulatory Visit

## 2023-12-07 ENCOUNTER — Ambulatory Visit
Admission: RE | Admit: 2023-12-07 | Discharge: 2023-12-07 | Disposition: A | Discharge status: Home / Self Care | Source: Ambulatory Visit | Attending: Family Medicine | Admitting: Family Medicine

## 2023-12-07 DIAGNOSIS — N644 Mastodynia: Secondary | ICD-10-CM | POA: Diagnosis not present

## 2023-12-07 LAB — WOUND CULTURE
MICRO NUMBER:: 16268159
SPECIMEN QUALITY:: ADEQUATE

## 2023-12-08 ENCOUNTER — Encounter: Payer: Self-pay | Admitting: Podiatry

## 2023-12-08 ENCOUNTER — Other Ambulatory Visit: Payer: Self-pay | Admitting: Family Medicine

## 2023-12-08 ENCOUNTER — Other Ambulatory Visit: Payer: Self-pay | Admitting: Podiatry

## 2023-12-08 MED ORDER — FLUCONAZOLE 150 MG PO TABS: 150.0000 mg | ORAL_TABLET | ORAL | 0 refills | Status: DC

## 2023-12-15 ENCOUNTER — Telehealth: Payer: Self-pay | Admitting: Podiatry

## 2023-12-15 ENCOUNTER — Other Ambulatory Visit: Payer: Self-pay | Admitting: Podiatry

## 2023-12-15 MED ORDER — MUPIROCIN 2 % EX OINT: 1.0000 | TOPICAL_OINTMENT | Freq: Two times a day (BID) | CUTANEOUS | 2 refills | Status: DC

## 2023-12-15 NOTE — Telephone Encounter (Signed)
 Patient is requesting refill (Mupirocin 2%) Patient contact telephone number, [phone]

## 2023-12-21 ENCOUNTER — Ambulatory Visit: Admitting: Podiatry

## 2023-12-21 DIAGNOSIS — Z79899 Other long term (current) drug therapy: Secondary | ICD-10-CM

## 2023-12-21 DIAGNOSIS — B351 Tinea unguium: Secondary | ICD-10-CM

## 2023-12-21 DIAGNOSIS — L600 Ingrowing nail: Secondary | ICD-10-CM | POA: Diagnosis not present

## 2023-12-21 MED ORDER — MUPIROCIN 2 % EX OINT: 1.0000 | TOPICAL_OINTMENT | Freq: Two times a day (BID) | CUTANEOUS | 2 refills | Status: DC

## 2023-12-21 NOTE — Patient Instructions (Signed)
Fluconazole Tablets What is this medication? FLUCONAZOLE (floo KON na zole) prevents and treats fungal or yeast infections. It belongs to a group of medications called antifungals. It will not prevent or treat colds, the flu, or infections caused by bacteria or viruses. This medicine may be used for other purposes; ask your health care provider or pharmacist if you have questions. COMMON BRAND NAME(S): Diflucan What should I tell my care team before I take this medication? They need to know if you have any of these conditions: Irregular heartbeat or rhythm Kidney disease Liver disease Low levels of potassium in the blood An unusual or allergic reaction to fluconazole, other medications, foods, dyes, or preservatives Pregnant or trying to get pregnant Breastfeeding How should I use this medication? Take this medication by mouth. Take it as directed on the prescription label at the same time every day. You can take it with or without food. If it upsets your stomach, take it with food. Take all of this medication unless your care team tells you to stop it early. Keep taking it even if you think you are better. Talk to your care team about the use of this medication in children. While it may be prescribed for children as young as newborns for selected conditions, precautions do apply. Overdosage: If you think you have taken too much of this medicine contact a poison control center or emergency room at once. NOTE: This medicine is only for you. Do not share this medicine with others. What if I miss a dose? If you miss a dose, take it as soon as you can. If it is almost time for your next dose, take only that dose. Do not take double or extra doses. What may interact with this medication? Do not take this medication with any of the following: Adagrasib Flibanserin Lomitapide Lonafarnib Other medications that cause heart rhythm changes Triazolam This medication may also interact with the  following: Abrocitinib Certain antibiotics, such as rifabutin or rifampin Certain antivirals for HIV or hepatitis Certain medications for blood pressure, heart disease, irregular heartbeat Certain medications for cholesterol, such as atorvastatin, lovastatin, simvastatin Certain medications for depression, such as amitriptyline or nortriptyline Certain medications for diabetes, such as glipizide or glyburide Certain medications for seizures, such as carbamazepine or phenytoin Certain medications that treat or prevent blood clots, such as warfarin Certain opioid medications for pain, such as alfentanil, fentanyl, methadone Cyclophosphamide Cyclosporine Ibrutinib Lemborexant Midazolam NSAIDS, medications for pain and inflammation, such as ibuprofen or naproxen Olaparib Sirolimus Steroid medications, such as prednisone Tacrolimus Theophylline Tofacitinib Tolvaptan Vinblastine Vincristine Vitamin A Voriconazole This list may not describe all possible interactions. Give your health care provider a list of all the medicines, herbs, non-prescription drugs, or dietary supplements you use. Also tell them if you smoke, drink alcohol, or use illegal drugs. Some items may interact with your medicine. What should I watch for while using this medication? Visit your care team for regular checkups. If you are taking this medication for a long time you may need blood work. Tell your care team if your symptoms do not improve. Some fungal infections need many weeks or months of treatment to cure. Alcohol can increase possible damage to your liver. Avoid alcoholic drinks. If you have a vaginal infection, do not have sex until you have finished your treatment. You can wear a sanitary napkin. Do not use tampons. Wear freshly washed cotton, not synthetic, panties. What side effects may I notice from receiving this medication? Side effects that   you should report to your care team as soon as  possible: Allergic reactions--skin rash, itching, hives, swelling of the face, lips, tongue, or throat Heart rhythm changes--fast or irregular heartbeat, dizziness, feeling faint or lightheaded, chest pain, trouble breathing Liver injury--right upper belly pain, loss of appetite, nausea, light-colored stool, dark yellow or brown urine, yellowing skin or eyes, unusual weakness or fatigue Low adrenal gland function--nausea, vomiting, loss of appetite, unusual weakness or fatigue, dizziness Rash, fever, and swollen lymph nodes Redness, blistering, peeling, or loosening of the skin, including inside the mouth Seizures Side effects that usually do not require medical attention (report to your care team if they continue or are bothersome): Change in taste Diarrhea Dizziness Headache Nausea Stomach pain This list may not describe all possible side effects. Call your doctor for medical advice about side effects. You may report side effects to FDA at 1-800-FDA-1088. Where should I keep my medication? Keep out of the reach of children. Store at room temperature below 30 degrees C (86 degrees F). Throw away any medication after the expiration date. NOTE: This sheet is a summary. It may not cover all possible information. If you have questions about this medicine, talk to your doctor, pharmacist, or health care provider.  2024 Elsevier/Gold Standard (2022-10-26 00:00:00)  

## 2023-12-25 NOTE — Progress Notes (Signed)
 Subjective: Chief Complaint  Patient presents with   Nail Problem    RM#11 Right big toe nail no improvement patient states wants nail removed.     53 year old female presents as a follow-up evaluation after partial nail avulsion.  States that she is having some tenderness and she is interested in having the entire nail removed.  She does not see any drainage or pus or increasing swelling or redness that she had previously.  She still been soaking in Epsom salts.  No recent injuries.    Objective: AAO x3, NAD DP/PT pulses palpable bilaterally, CRT less than 3 seconds Status post partial nail avulsion of the hallux.  There is still some faint erythema to think is coming more from inflammation as opposed to infection.  There is no drainage or pus.  She still has tenderness mostly on the nail border but there is some scabbing still noted.  There is no fluctuation, crepitation. The nails are also thickened discolored with subungual debris presently the nails are brittle. No pain with calf compression, swelling, warmth, erythema  Assessment: Status post first nail avulsion with localized erythema; onychomycosis  Plan: -All treatment options discussed with the patient including all alternatives, risks, complications.  -We discussed possibility of total nail removal.  Overall the toe does look better compared to what it has been previously.  She still has some tenderness but the procedure site still healing.  Ultimately we decided to hold off on removing the toenail.  I told her to continue soaking and use a small Mehta antibiotic ointment during the day but leave the area open at nighttime.  Should she have any reoccurrence of infection that she did previously or any further issues once this is healed then I would consider nail removal but I think we need to let this procedure heal to see what benefit she is in and get before proceed with further procedures. - Most of the toenails are thick and  discolored.  Although the culture previously was negative this could be a false negative.  The other nails have a similar appearance as well.  Discussed oral Lamisil as a trial.  Discussed side effects.  Will check a CBC, LFT prior to starting the medication.  Charity Conch DPM

## 2024-01-06 ENCOUNTER — Other Ambulatory Visit: Payer: Self-pay | Admitting: Family Medicine

## 2024-01-06 DIAGNOSIS — G8929 Other chronic pain: Secondary | ICD-10-CM

## 2024-01-06 DIAGNOSIS — G2581 Restless legs syndrome: Secondary | ICD-10-CM

## 2024-01-11 ENCOUNTER — Ambulatory Visit: Payer: PPO | Admitting: Family Medicine

## 2024-01-19 ENCOUNTER — Other Ambulatory Visit: Payer: Self-pay | Admitting: Family Medicine

## 2024-01-19 ENCOUNTER — Telehealth: Payer: Self-pay

## 2024-01-19 ENCOUNTER — Ambulatory Visit
Admission: RE | Admit: 2024-01-19 | Discharge: 2024-01-19 | Disposition: A | Discharge status: Home / Self Care | Source: Ambulatory Visit | Attending: Family Medicine | Admitting: Family Medicine

## 2024-01-19 VITALS — BP 114/71 | HR 74 | Temp 98.5°F | Resp 18

## 2024-01-19 DIAGNOSIS — E1165 Type 2 diabetes mellitus with hyperglycemia: Secondary | ICD-10-CM

## 2024-01-19 DIAGNOSIS — N3001 Acute cystitis with hematuria: Secondary | ICD-10-CM | POA: Diagnosis not present

## 2024-01-19 DIAGNOSIS — N898 Other specified noninflammatory disorders of vagina: Secondary | ICD-10-CM | POA: Diagnosis not present

## 2024-01-19 LAB — POCT URINALYSIS DIP (MANUAL ENTRY)
Bilirubin, UA: NEGATIVE
Glucose, UA: NEGATIVE mg/dL
Ketones, POC UA: NEGATIVE mg/dL
Nitrite, UA: NEGATIVE
Protein Ur, POC: NEGATIVE mg/dL
Spec Grav, UA: 1.02 (ref 1.010–1.025)
Urobilinogen, UA: 0.2 U/dL
pH, UA: 5.5 (ref 5.0–8.0)

## 2024-01-19 LAB — POCT URINE PREGNANCY: Preg Test, Ur: NEGATIVE

## 2024-01-19 MED ORDER — METRONIDAZOLE 500 MG PO TABS: 500.0000 mg | ORAL_TABLET | Freq: Two times a day (BID) | ORAL | 0 refills | Status: DC

## 2024-01-19 MED ORDER — FLUCONAZOLE 150 MG PO TABS: 150.0000 mg | ORAL_TABLET | Freq: Every day | ORAL | 0 refills | Status: DC

## 2024-01-19 MED ORDER — CEPHALEXIN 500 MG PO CAPS
500.0000 mg | ORAL_CAPSULE | Freq: Two times a day (BID) | ORAL | 0 refills | Status: AC
Start: 2024-01-19 — End: 2024-01-26

## 2024-01-19 NOTE — ED Triage Notes (Signed)
 Pt present with c/o possible UTI and BV. States she has a fishy odor and has recently been tested for STD's. C/o vaginal discharge with no color and burning when voiding.

## 2024-01-19 NOTE — ED Provider Notes (Signed)
 [facility]    CSN: [id number] Arrival date & time: 01/19/24  1151      History   Chief Complaint Chief Complaint  Patient presents with   Vaginal Discharge    HPI [name] is a 53 y.o. female presents for vaginal discharge and dysuria.  Patient reports 2 to 3 days of a malodorous vaginal discharge as well as dysuria.  Denies any fevers, nausea/vomiting, flank pain.,  Urgency, frequency, hematuria.  Does have a history of recurrent BV infections and states this feels the same.  No STD exposure or concern.  No OTC treatments have been used since symptom onset.  No other concerns at this time.   Vaginal Discharge Associated symptoms: dysuria     Past Medical History:  Diagnosis Date   ADHD (attention deficit hyperactivity disorder)    Anemia    with pregnancy   Anxiety    Arthritis    Bipolar disorder (HCC)    Chronic back pain greater than 3 months duration    Depression    Diabetes mellitus without complication (HCC)    Diabetic nephropathy (HCC)    bilateral feet   Dysuria    has to have self in and out cath    Eroded bladder suspension mesh (HCC)    GERD (gastroesophageal reflux disease)    Hepatitis    C   History of COVID-19 11/2019   History of MRSA infection    after back surgery   History of sleep apnea    resolved after weight loss   Hypertension    Hypothyroidism    Leg pain, bilateral    Obesity    Obsessive-compulsive disorder    Pneumonia 12/24/2019   Rosacea    Sciatic nerve pain    Social anxiety disorder    Squamous cell carcinoma of back 2021    Patient Active Problem List   Diagnosis Date Noted   Overweight (BMI 25.0-29.9) 11/06/2023   Inclusion cyst 02/23/2023   Hypotension 01/02/2023   Transportation insecurity 12/21/2022   Dehydration 12/21/2022   Menopause 12/21/2022   Abscess 03/30/2022   Amenorrhea 10/29/2021   Abnormal findings on diagnostic imaging of liver and biliary tract 10/13/2021   Colon cancer  screening 10/13/2021   Constipation 10/13/2021   Gallstones, common bile duct 10/13/2021   Gastroesophageal reflux disease 10/13/2021   History of hepatitis C 10/13/2021   Intestinal gas excretion 10/13/2021   Liver fibrosis 10/13/2021   Right upper quadrant pain 10/13/2021   Stress at home 06/21/2021   Need for influenza vaccination 06/21/2021   Periorbital hyperpigmentation 05/03/2021   Abnormal CBC 05/03/2021   Lower respiratory infection 02/04/2021   Hematuria 12/28/2020   Status post total replacement of left hip 10/12/2020   Peripheral edema 04/02/2020   COVID-19 07/09/2019   Left hip pain 04/30/2019   Otitis externa of left ear 04/30/2019   Cholesteatoma of left ear 04/30/2019   Dysfunction of left eustachian tube 04/30/2019   Left ear pain 04/30/2019   Sciatica 04/19/2019   Class 3 severe obesity due to excess calories with serious comorbidity and body mass index (BMI) of 45.0 to 49.9 in adult 04/19/2019   Post-nasal drip 01/10/2019   Allergic cough 01/10/2019   Obstructive sleep apnea syndrome 12/11/2018   Acute frontal sinusitis 12/10/2018   Primary osteoarthritis of left hip 11/13/2018   Morbid obesity (HCC) 11/13/2018   Elevated cholesterol 11/12/2018   Essential hypertension 11/12/2018   Allergic rhinitis due to pollen 11/12/2018  Reactive airway disease 11/12/2018   Breast pain, right 07/17/2018   Refused influenza vaccine 07/17/2018   Chronic nonintractable headache 02/12/2018   Dysuria 02/12/2018   Localized edema 02/12/2018   Hospital discharge follow-up 01/23/2018   Bladder problem 01/05/2018   Depression 01/05/2018   Insomnia 01/05/2018   Hepatitis C infection 12/01/2015   RLS (restless legs syndrome) 10/23/2015   Elevated liver enzymes 10/22/2015   Recurrent major depression-severe (HCC) 04/17/2015   GAD (generalized anxiety disorder) 04/15/2015   Bipolar depression (HCC) 04/15/2015   Severe recurrent major depression w/psychotic features,  mood-congruent (HCC) 04/14/2015   Anxiety disorder 04/14/2015   Major depressive disorder, recurrent severe without psychotic features (HCC) 04/14/2015   Suicidal ideations    Chronic low back pain 01/02/2015   Adult hypothyroidism 06/25/2012   B12 deficiency 06/25/2012   Other fatigue 06/25/2012   Chronic arthralgias of knees and hips 06/25/2012   Leukocytosis 06/25/2012   Vitamin D deficiency 06/25/2012    Past Surgical History:  Procedure Laterality Date   BACK SURGERY     total of 6 back surgeries   bladder mesh  2009   DILITATION & CURRETTAGE/HYSTROSCOPY WITH NOVASURE ABLATION N/A 08/19/2016   Procedure: DILATATION & CURETTAGE WITH NOVASURE ABLATION;  Surgeon: Astrid Blamer, MD;  Location: [facility];  Service: Gynecology;  Laterality: N/A;   EUS N/A 09/09/2015   Procedure: UPPER ENDOSCOPIC ULTRASOUND (EUS) RADIAL;  Surgeon: Evangeline Hilts, MD;  Location: [facility];  Service: Endoscopy;  Laterality: N/A;   EYE SURGERY     INCONTINENCE SURGERY     SPINAL CORD STIMULATOR INSERTION     SPINAL CORD STIMULATOR REMOVAL     SPINAL FUSION  2008   times two   TOTAL HIP ARTHROPLASTY Left 10/12/2020   Procedure: LEFT TOTAL HIP ARTHROPLASTY ANTERIOR APPROACH;  Surgeon: Wes Hamman, MD;  Location: [facility];  Service: Orthopedics;  Laterality: Left;   TUBAL LIGATION  2006    OB History     Gravida  3   Para      Term      Preterm      AB  1   Living  2      SAB      IAB  1   Ectopic      Multiple      Live Births  2            Home Medications    Prior to Admission medications   Medication Sig Start Date End Date Taking? Authorizing Provider  cephALEXin  (KEFLEX ) 500 MG capsule Take 1 capsule (500 mg total) by mouth 2 (two) times daily for 7 days. 01/19/24 01/26/24 Yes Yvana Samonte, Jodi R, NP  fluconazole  (DIFLUCAN ) 150 MG tablet Take 1 tablet (150 mg total) by mouth daily. 01/19/24  Yes Cristan Scherzer, Jodi R, NP  metroNIDAZOLE  (FLAGYL ) 500 MG tablet Take 1 tablet (500 mg  total) by mouth 2 (two) times daily. 01/19/24  Yes Kiptyn Rafuse, Jodi R, NP  ALPRAZolam  (XANAX ) 0.5 MG tablet Take 0.5 mg by mouth 3 (three) times daily as needed for anxiety. 12/20/19   [provider]  ARIPiprazole  (ABILIFY ) 5 MG tablet Take 1 tablet (5 mg total) by mouth daily. 09/17/15   Kieran Pellet, MD  aspirin -acetaminophen -caffeine (EXCEDRIN MIGRAINE) 250-250-65 MG tablet Take 1 tablet by mouth every 6 (six) hours as needed for migraine.    [provider]  atorvastatin  (LIPITOR) 20 MG tablet TAKE 1 TABLET(20 MG) BY MOUTH DAILY 07/13/23   Tilmon Font,  Adolm Ahumada, MD  blood glucose meter kit and supplies KIT Dispense based on patient and insurance preference. Check glucose before breakfast. ICD: E11.65 04/02/20   Nche, Connye Delaine, NP  diclofenac  Sodium (VOLTAREN ) 1 % GEL APPLY TWO GRAMS TOPICALLY DAILY AS NEEDED 12/08/23   Tonna Frederic, MD  FINACEA  15 % FOAM Apply 1 application  topically 2 (two) times daily as needed (rosacea). 02/14/20   [provider]  gabapentin  (NEURONTIN ) 300 MG capsule TAKE ONE CAPSULE BY MOUTH THREE TIMES A DAY 01/08/24   Tonna Frederic, MD  glucose blood test strip Use as instructed, check glucose before breakfast. ICD: E11.65 07/21/20   Tonna Frederic, MD  lamoTRIgine  (LAMICTAL ) 150 MG tablet Take 300 mg by mouth daily. 02/22/20   [provider]  Lancets (FREESTYLE) lancets Use as instructed. E11.65 07/21/20   Tonna Frederic, MD  levothyroxine  (SYNTHROID ) 75 MCG tablet Take 1 tablet (75 mcg total) by mouth daily before breakfast. 07/13/23   Tonna Frederic, MD  lidocaine  (XYLOCAINE ) 2 % solution Use as directed 15 mLs in the mouth or throat every 3 (three) hours as needed for mouth pain. Swish and spit. 05/14/23   Blitch, Dasie Epps, NP  methylphenidate  (RITALIN ) 20 MG tablet Take 20 mg by mouth 2 (two) times daily. 09/22/20   [provider]  mupirocin  ointment (BACTROBAN ) 2 % Apply 1 Application  topically 2 (two) times daily. 12/15/23   Charity Conch, DPM  mupirocin  ointment (BACTROBAN ) 2 % Apply 1 Application topically 2 (two) times daily. 12/21/23   Charity Conch, DPM  pantoprazole  (PROTONIX ) 40 MG tablet Take 1 tablet (40 mg total) by mouth daily. 07/13/23   Tonna Frederic, MD  polyethylene glycol powder (GLYCOLAX /MIRALAX ) 17 GM/SCOOP powder Take 17 g by mouth daily. 11/30/22   Nche, Connye Delaine, NP  Semaglutide ,0.25 or 0.5MG /DOS, (OZEMPIC , 0.25 OR 0.5 MG/DOSE,) 2 MG/3ML SOPN Inject 0.25 mg into the skin once a week. 12/01/23   Tonna Frederic, MD  zolpidem  (AMBIEN ) 5 MG tablet Take 5 mg by mouth at bedtime as needed for sleep (Crystal Monatnew).    [provider]    Family History Family History  Problem Relation Age of Onset   Schizophrenia Father    Schizophrenia Cousin    Hyperthyroidism Mother    Hypothyroidism Maternal Grandmother     Social History Social History   Tobacco Use   Smoking status: Former    Current packs/day: 0.00    Average packs/day: 1 pack/day for 15.0 years (15.0 ttl pk-yrs)    Types: Cigarettes    Start date: 10/04/2006    Quit date: 10/04/2021    Years since quitting: 2.2   Smokeless tobacco: Former   Tobacco comments:    use vapor, social  Vaping Use   Vaping status: Never Used  Substance Use Topics   Alcohol  use: Yes    Comment: occ   Drug use: No     Allergies   Pregabalin, Bupropion hcl, Celecoxib, Effexor [venlafaxine hcl], Oxybutynin , Pramipexole, Pramipexole dihydrochloride, Pregabalin, Prozac [fluoxetine hcl], Symbyax [olanzapine-fluoxetine hcl], Venlafaxine, and Desvenlafaxine   Review of Systems Review of Systems  Genitourinary:  Positive for dysuria and vaginal discharge.     Physical Exam Triage Vital Signs ED Triage Vitals [01/19/24 1159]  Encounter Vitals Group     BP 114/71     Systolic BP Percentile      Diastolic BP Percentile      Pulse Rate 74  Resp 18     Temp 98.5 F  (36.9 C)     Temp Source Oral     SpO2 98 %     Weight      Height      Head Circumference      Peak Flow      Pain Score 0     Pain Loc      Pain Education      Exclude from Growth Chart    No data found.  Updated Vital Signs BP 114/71 (BP Location: Left Arm)   Pulse 74   Temp 98.5 F (36.9 C) (Oral)   Resp 18   SpO2 98%   Visual Acuity Right Eye Distance:   Left Eye Distance:   Bilateral Distance:    Right Eye Near:   Left Eye Near:    Bilateral Near:     Physical Exam Vitals and nursing note reviewed.  Constitutional:      Appearance: Normal appearance.  HENT:     Head: Normocephalic and atraumatic.  Eyes:     Pupils: Pupils are equal, round, and reactive to light.  Cardiovascular:     Rate and Rhythm: Normal rate.  Pulmonary:     Effort: Pulmonary effort is normal.  Abdominal:     Tenderness: There is no right CVA tenderness or left CVA tenderness.  Skin:    General: Skin is warm and dry.  Neurological:     General: No focal deficit present.     Mental Status: She is alert and oriented to person, place, and time.  Psychiatric:        Mood and Affect: Mood normal.        Behavior: Behavior normal.      UC Treatments / Results  Labs (all labs ordered are listed, but only abnormal results are displayed) Labs Reviewed  POCT URINALYSIS DIP (MANUAL ENTRY) - Abnormal; Notable for the following components:      Result Value   Clarity, UA cloudy (*)    Blood, UA small (*)    Leukocytes, UA Small (1+) (*)    All other components within normal limits  URINE CULTURE  POCT URINE PREGNANCY  CERVICOVAGINAL ANCILLARY ONLY    EKG   Radiology No results found.  Procedures Procedures (including critical care time)  Medications Ordered in UC Medications - No data to display  Initial Impression / Assessment and Plan / UC Course  I have reviewed the triage vital signs and the nursing notes.  Pertinent labs & imaging results that were available  during my care of the patient were reviewed by me and considered in my medical decision making (see chart for details).     Reviewed exam and symptoms with patient.  No red flags.  Vaginal swab was ordered we will contact for any positive results.  Will start metronidazole .  Patient requested Diflucan  to prevent yeast infection.  Urine positive for UTI, will culture and start Keflex .  Discussed rest fluids and PCP or GYN follow-up if symptoms do not improve.  ER precautions reviewed and patient verbalized understanding. Final Clinical Impressions(s) / UC Diagnoses   Final diagnoses:  Vaginal discharge  Acute cystitis with hematuria     Discharge Instructions      The clinic will contact you with results of the vaginal swab and urine culture done today if positive.  Start metronidazole  twice daily for 7 days to treat your BV infection.  May take Diflucan  to help prevent a  yeast infection.  Start Keflex  twice daily for 7 days to treat your UTI.  Lots of rest and fluids.  Please follow-up with your PCP or gynecologist if your symptoms do not improve.  Please go to the ER for any worsening symptoms.  Hope you feel better soon!  ED Prescriptions     Medication Sig Dispense Auth. Provider   metroNIDAZOLE  (FLAGYL ) 500 MG tablet Take 1 tablet (500 mg total) by mouth 2 (two) times daily. 14 tablet Najla Aughenbaugh, Jodi R, NP   fluconazole  (DIFLUCAN ) 150 MG tablet Take 1 tablet (150 mg total) by mouth daily. 1 tablet Madai Nuccio, Jodi R, NP   cephALEXin  (KEFLEX ) 500 MG capsule Take 1 capsule (500 mg total) by mouth 2 (two) times daily for 7 days. 14 capsule Reyansh Kushnir, Jodi R, NP      PDMP not reviewed this encounter.   Alleen Arbour, NP 01/19/24 1228

## 2024-01-19 NOTE — Discharge Instructions (Addendum)
 The clinic will contact you with results of the vaginal swab and urine culture done today if positive.  Start metronidazole  twice daily for 7 days to treat your BV infection.  May take Diflucan  to help prevent a yeast infection.  Start Keflex  twice daily for 7 days to treat your UTI.  Lots of rest and fluids.  Please follow-up with your PCP or gynecologist if your symptoms do not improve.  Please go to the ER for any worsening symptoms.  Hope you feel better soon!

## 2024-01-19 NOTE — Telephone Encounter (Signed)
 Pharmacy Patient Advocate Encounter   Received notification from CoverMyMeds that prior authorization for  Ozempic  (0.25 or 0.5 MG/DOSE) 2MG /3ML pen-injectors is required/requested.   Insurance verification completed.   The patient is insured through [facility] ADV[employer]AGE/RX ADVANCE .   Per test claim: PA required; PA submitted to above mentioned insurance via CoverMyMeds Key/confirmation #/EOC [id number] Status is pending

## 2024-01-21 LAB — URINE CULTURE: Culture: 80000 — AB

## 2024-01-22 ENCOUNTER — Other Ambulatory Visit (HOSPITAL_COMMUNITY): Payer: Self-pay

## 2024-01-22 ENCOUNTER — Ambulatory Visit (HOSPITAL_COMMUNITY): Payer: Self-pay

## 2024-01-22 LAB — CERVICOVAGINAL ANCILLARY ONLY
Bacterial Vaginitis (gardnerella): POSITIVE — AB
Candida Glabrata: NEGATIVE
Candida Vaginitis: POSITIVE — AB
Comment: NEGATIVE
Comment: NEGATIVE
Comment: NEGATIVE

## 2024-01-22 NOTE — Telephone Encounter (Signed)
 Pharmacy Patient Advocate Encounter  Received notification from [facility] ADVANTAGE/RX ADVANCE that Prior Authorization for Ozempic  (0.25 or 0.5 MG/DOSE) 2MG /3ML pen-injectors has been APPROVED from 01/19/2024 to 01/18/2025   PA #/Case ID/Reference #: Maylene Spear

## 2024-01-23 ENCOUNTER — Ambulatory Visit: Admitting: Podiatry

## 2024-01-23 DIAGNOSIS — L600 Ingrowing nail: Secondary | ICD-10-CM

## 2024-01-23 DIAGNOSIS — B351 Tinea unguium: Secondary | ICD-10-CM

## 2024-01-25 NOTE — Progress Notes (Signed)
 Subjective: Chief Complaint  Patient presents with   Nail Problem    RM 13 Patient is here for f/u right foot ingrown toe nail.      53 year old female presents as a follow-up evaluation after partial nail avulsion.  She states the nail is doing fine from an ingrown toenail perspective and she not having any pain associate with this and she denies any swelling or redness or any drainage.  She said the nail still is discolored and she has not yet had the blood work done to start the oral medication.  She has no other concerns today.    Objective: AAO x3, NAD DP/PT pulses palpable bilaterally, CRT less than 3 seconds Status post partial nail avulsion of the hallux.  There is no edema, erythema, drainage or pus or any obvious signs of infection noted today.  The nail is hypertrophic, dystrophic with yellow, brown discoloration.  No open lesions.  No other areas of discomfort. No pain with calf compression, swelling, warmth, erythema  Assessment: Ingrown toenail with improvement; onychomycosis  Plan: -All treatment options discussed with the patient including all alternatives, risks, complications.  -From ingrown toenail perspective she is doing well and this is resolved.  Monitor for any signs or symptoms of infection and/or reoccurrence. -Will likely start oral mag patient for nail fungus.  Will check CBC, LFT and the order was provided again today for this.  No follow-ups on file.  Charity Conch DPM

## 2024-01-30 ENCOUNTER — Ambulatory Visit: Payer: Self-pay | Admitting: Family Medicine

## 2024-01-30 ENCOUNTER — Encounter: Payer: Self-pay | Admitting: Family Medicine

## 2024-01-30 ENCOUNTER — Ambulatory Visit: Admitting: Family Medicine

## 2024-01-30 VITALS — BP 114/64 | HR 69 | Temp 98.3°F | Ht 64.0 in | Wt 184.4 lb

## 2024-01-30 DIAGNOSIS — M2550 Pain in unspecified joint: Secondary | ICD-10-CM | POA: Diagnosis not present

## 2024-01-30 DIAGNOSIS — Z6831 Body mass index (BMI) 31.0-31.9, adult: Secondary | ICD-10-CM | POA: Diagnosis not present

## 2024-01-30 DIAGNOSIS — E66811 Obesity, class 1: Secondary | ICD-10-CM | POA: Diagnosis not present

## 2024-01-30 DIAGNOSIS — R7303 Prediabetes: Secondary | ICD-10-CM

## 2024-01-30 DIAGNOSIS — Z114 Encounter for screening for human immunodeficiency virus [HIV]: Secondary | ICD-10-CM

## 2024-01-30 DIAGNOSIS — E6609 Other obesity due to excess calories: Secondary | ICD-10-CM

## 2024-01-30 LAB — BASIC METABOLIC PANEL WITH GFR
BUN: 15 mg/dL (ref 6–23)
CO2: 28 meq/L (ref 19–32)
Calcium: 9.3 mg/dL (ref 8.4–10.5)
Chloride: 105 meq/L (ref 96–112)
Creatinine, Ser: 0.65 mg/dL (ref 0.40–1.20)
GFR: 102.16 mL/min (ref >60.00)
Glucose, Bld: 97 mg/dL (ref 70–99)
Potassium: 4.5 meq/L (ref 3.5–5.1)
Sodium: 140 meq/L (ref 135–145)

## 2024-01-30 LAB — SEDIMENTATION RATE: Sed Rate: 11 mm/h (ref 0–30)

## 2024-01-30 LAB — C-REACTIVE PROTEIN: CRP: <1 mg/dL (ref 0.5–20.0)

## 2024-01-30 LAB — HEMOGLOBIN A1C: Hgb A1c MFr Bld: 5.1 % (ref 4.6–6.5)

## 2024-01-30 NOTE — Progress Notes (Signed)
 Established Patient Office Visit   Subjective:  Patient ID: [name], female    DOB: 1971-03-16  Age: 53 y.o. MRN: [id number]  Chief Complaint  Patient presents with   Medical Management of Chronic Issues    Follow up. Pt is not fasting. Pt wants Tsh and A1c checked.    Joint Pain    Pt complains of joint pain in her wrist x 1 month.     HPI Encounter Diagnoses  Name Primary?   Class 1 obesity due to excess calories with body mass index (BMI) of 31.0 to 31.9 in adult, unspecified whether serious comorbidity present Yes   Prediabetes    Screening for HIV (human immunodeficiency virus)    Arthralgia of multiple joints    For follow-up of above.  A1c has been in the normal range.  She continues with semaglutide  to help with obesity.  She cannot afford the copayment for weight loss management.  No regular exercise.  Ongoing joint pains involving her wrists and knees hips and ankles.  No early morning stiffness.   Review of Systems  Constitutional: Negative.   HENT: Negative.    Eyes:  Negative for blurred vision, discharge and redness.  Respiratory: Negative.    Cardiovascular: Negative.   Gastrointestinal:  Negative for abdominal pain.  Genitourinary: Negative.   Musculoskeletal:  Positive for joint pain. Negative for myalgias.  Skin:  Negative for rash.  Neurological:  Negative for tingling, loss of consciousness and weakness.  Endo/Heme/Allergies:  Negative for polydipsia.     Current Outpatient Medications:    ALPRAZolam  (XANAX ) 0.5 MG tablet, Take 0.5 mg by mouth 3 (three) times daily as needed for anxiety., Disp: , Rfl:    ARIPiprazole  (ABILIFY ) 5 MG tablet, Take 1 tablet (5 mg total) by mouth daily., Disp: 30 tablet, Rfl: 2   aspirin -acetaminophen -caffeine (EXCEDRIN MIGRAINE) 250-250-65 MG tablet, Take 1 tablet by mouth every 6 (six) hours as needed for migraine., Disp: , Rfl:    atorvastatin  (LIPITOR) 20 MG tablet, TAKE 1 TABLET(20 MG) BY MOUTH DAILY, Disp: 100  tablet, Rfl: 3   blood glucose meter kit and supplies KIT, Dispense based on patient and insurance preference. Check glucose before breakfast. ICD: E11.65, Disp: 1 each, Rfl: 0   diclofenac  Sodium (VOLTAREN ) 1 % GEL, APPLY TWO GRAMS TOPICALLY DAILY AS NEEDED, Disp: 200 g, Rfl: 0   doxepin (SINEQUAN) 25 MG capsule, Take 25 mg by mouth at bedtime., Disp: , Rfl:    FINACEA  15 % FOAM, Apply 1 application  topically 2 (two) times daily as needed (rosacea)., Disp: , Rfl:    fluconazole  (DIFLUCAN ) 150 MG tablet, Take 1 tablet (150 mg total) by mouth daily., Disp: 1 tablet, Rfl: 0   gabapentin  (NEURONTIN ) 300 MG capsule, TAKE ONE CAPSULE BY MOUTH THREE TIMES A DAY, Disp: 90 capsule, Rfl: 1   glucose blood test strip, Use as instructed, check glucose before breakfast. ICD: E11.65, Disp: 100 each, Rfl: 12   lamoTRIgine  (LAMICTAL ) 150 MG tablet, Take 300 mg by mouth daily., Disp: , Rfl:    Lancets (FREESTYLE) lancets, Use as instructed. E11.65, Disp: 100 each, Rfl: 12   levothyroxine  (SYNTHROID ) 75 MCG tablet, Take 1 tablet (75 mcg total) by mouth daily before breakfast., Disp: 90 tablet, Rfl: 2   lidocaine  (XYLOCAINE ) 2 % solution, Use as directed 15 mLs in the mouth or throat every 3 (three) hours as needed for mouth pain. Swish and spit., Disp: 100 mL, Rfl: 0   pantoprazole  (PROTONIX )  40 MG tablet, Take 1 tablet (40 mg total) by mouth daily., Disp: 90 tablet, Rfl: 1   polyethylene glycol powder (GLYCOLAX /MIRALAX ) 17 GM/SCOOP powder, Take 17 g by mouth daily., Disp: 850 g, Rfl: 3   Semaglutide ,0.25 or 0.5MG /DOS, (OZEMPIC , 0.25 OR 0.5 MG/DOSE,) 2 MG/3ML SOPN, INJECT 0.25 MG INTO THE SKIN ONCE A WEEK, Disp: 3 mL, Rfl: 0   methylphenidate  (RITALIN ) 20 MG tablet, Take 20 mg by mouth 2 (two) times daily. (Patient not taking: Reported on 01/30/2024), Disp: , Rfl:    mupirocin  ointment (BACTROBAN ) 2 %, Apply 1 Application topically 2 (two) times daily. (Patient not taking: Reported on 01/30/2024), Disp: 30 g, Rfl: 2    mupirocin  ointment (BACTROBAN ) 2 %, Apply 1 Application topically 2 (two) times daily. (Patient not taking: Reported on 01/30/2024), Disp: 30 g, Rfl: 2   zolpidem  (AMBIEN ) 5 MG tablet, Take 5 mg by mouth at bedtime as needed for sleep (Crystal Monatnew). (Patient not taking: Reported on 01/30/2024), Disp: , Rfl:    Objective:     BP 114/64 (Cuff Size: Normal)   Pulse 69   Temp 98.3 F (36.8 C) (Temporal)   Ht 5\' 4"  (1.626 m)   Wt 184 lb 6.4 oz (83.6 kg)   SpO2 99%   BMI 31.65 kg/m  Wt Readings from Last 3 Encounters:  01/30/24 184 lb 6.4 oz (83.6 kg)  11/16/23 179 lb 9.6 oz (81.5 kg)  11/06/23 172 lb (78 kg)      Physical Exam Constitutional:      General: She is not in acute distress.    Appearance: Normal appearance. She is not ill-appearing, toxic-appearing or diaphoretic.  HENT:     Head: Normocephalic and atraumatic.     Right Ear: External ear normal.     Left Ear: External ear normal.  Eyes:     General: No scleral icterus.       Right eye: No discharge.        Left eye: No discharge.     Extraocular Movements: Extraocular movements intact.     Conjunctiva/sclera: Conjunctivae normal.  Pulmonary:     Effort: Pulmonary effort is normal. No respiratory distress.  Skin:    General: Skin is warm and dry.  Neurological:     Mental Status: She is alert and oriented to person, place, and time.  Psychiatric:        Mood and Affect: Mood normal.        Behavior: Behavior normal.      No results found for any visits on 01/30/24.    The 10-year ASCVD risk score (Arnett DK, et al., 2019) is: 1.9%    Assessment & Plan:   Class 1 obesity due to excess calories with body mass index (BMI) of 31.0 to 31.9 in adult, unspecified whether serious comorbidity present  Prediabetes -     Hemoglobin A1c -     Basic metabolic panel with GFR  Screening for HIV (human immunodeficiency virus) -     HIV Antibody (routine testing w rflx)  Arthralgia of multiple joints -      C-reactive protein -     Sedimentation rate -     Rheumatoid factor -     Lyme Disease Serology w/Reflex    Return in about 6 months (around 08/01/2024).  Continue current medicines.  Encouraged regular exercise such as walking for 30 minutes.  Continue semaglutide .  Tonna Frederic, MD

## 2024-01-31 LAB — HIV ANTIBODY (ROUTINE TESTING W REFLEX): HIV 1&2 Ab, 4th Generation: NONREACTIVE

## 2024-01-31 LAB — RHEUMATOID FACTOR: Rheumatoid fact SerPl-aCnc: <10 [IU]/mL (ref <14)

## 2024-01-31 LAB — LYME DISEASE SEROLOGY W/REFLEX: Lyme Total Antibody EIA: NEGATIVE

## 2024-02-07 ENCOUNTER — Other Ambulatory Visit (HOSPITAL_COMMUNITY)
Admission: RE | Admit: 2024-02-07 | Discharge: 2024-02-07 | Disposition: A | Discharge status: Home / Self Care | Source: Ambulatory Visit | Attending: Obstetrics | Admitting: Obstetrics

## 2024-02-07 ENCOUNTER — Encounter: Payer: Self-pay | Admitting: Obstetrics and Gynecology

## 2024-02-07 ENCOUNTER — Ambulatory Visit (INDEPENDENT_AMBULATORY_CARE_PROVIDER_SITE_OTHER): Admitting: Obstetrics and Gynecology

## 2024-02-07 VITALS — BP 112/74 | HR 70 | Ht 64.0 in | Wt 186.7 lb

## 2024-02-07 DIAGNOSIS — Z1339 Encounter for screening examination for other mental health and behavioral disorders: Secondary | ICD-10-CM | POA: Diagnosis not present

## 2024-02-07 DIAGNOSIS — N3000 Acute cystitis without hematuria: Secondary | ICD-10-CM

## 2024-02-07 DIAGNOSIS — R300 Dysuria: Secondary | ICD-10-CM | POA: Diagnosis not present

## 2024-02-07 DIAGNOSIS — Z124 Encounter for screening for malignant neoplasm of cervix: Secondary | ICD-10-CM | POA: Insufficient documentation

## 2024-02-07 DIAGNOSIS — Z113 Encounter for screening for infections with a predominantly sexual mode of transmission: Secondary | ICD-10-CM | POA: Diagnosis not present

## 2024-02-07 DIAGNOSIS — Z1151 Encounter for screening for human papillomavirus (HPV): Secondary | ICD-10-CM | POA: Insufficient documentation

## 2024-02-07 DIAGNOSIS — Z01419 Encounter for gynecological examination (general) (routine) without abnormal findings: Secondary | ICD-10-CM

## 2024-02-07 DIAGNOSIS — N898 Other specified noninflammatory disorders of vagina: Secondary | ICD-10-CM | POA: Diagnosis not present

## 2024-02-07 LAB — POCT URINALYSIS DIPSTICK
Bilirubin, UA: NEGATIVE
Glucose, UA: NEGATIVE
Ketones, UA: NEGATIVE
Nitrite, UA: NEGATIVE
Protein, UA: NEGATIVE
Spec Grav, UA: 1.01 (ref 1.010–1.025)
Urobilinogen, UA: 2 U/dL — AB
pH, UA: 8 (ref 5.0–8.0)

## 2024-02-07 MED ORDER — FLUCONAZOLE 150 MG PO TABS: 150.0000 mg | ORAL_TABLET | Freq: Once | ORAL | 1 refills | Status: AC

## 2024-02-07 MED ORDER — METRONIDAZOLE 500 MG PO TABS: 500.0000 mg | ORAL_TABLET | Freq: Two times a day (BID) | ORAL | 0 refills | Status: AC

## 2024-02-07 MED ORDER — NITROFURANTOIN MONOHYD MACRO 100 MG PO CAPS
100.0000 mg | ORAL_CAPSULE | Freq: Two times a day (BID) | ORAL | 0 refills | Status: DC
Start: 2024-02-07 — End: 2024-02-11

## 2024-02-07 MED ORDER — PHENAZOPYRIDINE HCL 200 MG PO TABS: 200.0000 mg | ORAL_TABLET | Freq: Three times a day (TID) | ORAL | 0 refills | Status: DC | PRN

## 2024-02-07 NOTE — Progress Notes (Signed)
 Pt presents for annual. Pt states that she was seen @ the health dept on 5/16 and was treated for bv and uti, but is having the same symptoms.

## 2024-02-07 NOTE — Progress Notes (Signed)
 ANNUAL EXAM Patient name: [name] MRN [id number]  Date of birth: [birth year] Chief Complaint:   Annual Exam  History of Present Illness:   [name] is a 53 y.o. [phone]  female being seen today for a routine annual exam.  Current complaints: Reestablishing care, she reports vaginal odor, burning with urination this morning that brought her to tears, reports urine is cloudy   Missed period for 8 months, then period came back the past two months, denies hot flashes or night sweats. She does not want HRT.  Reports irritability, is taking medication for her mood and reports she is finally on a good regimen that she feels is helping and does not want to do anything to adjust it.   History of BV and yeast, feels she has it today based on vaginal odor.  She follows PCP  No LMP recorded. Patient is perimenopausal.   The pregnancy intention screening data noted above was reviewed. Potential methods of contraception were discussed. The patient elected to proceed with No data recorded.   Gynecologic History Patient's last menstrual period was . Last Pap:04/15/20 . Results were: Normal Last mammogram:12/07/23 . Results were:Normal  Review of Systems:   Pertinent items are noted in HPI Denies any headaches, blurred vision, fatigue, shortness of breath, chest pain, abdominal pain, abnormal vaginal discharge/itching/odor/irritation, problems with periods, bowel movements, urination, or intercourse unless otherwise stated above. Pertinent History Reviewed:  Reviewed past medical,surgical, social and family history.  Reviewed problem list, medications and allergies. Physical Assessment:   Vitals:   02/07/24 0946  BP: 112/74  Pulse: 70  Weight: 186 lb 11.2 oz (84.7 kg)  Height: 5\' 4"  (1.626 m)  Body mass index is 32.05 kg/m.        Physical Examination:   General appearance - well appearing, and in no distress  Mental status - alert, oriented   Psych:  She has a normal mood and  affect  Skin - warm and dry, normal color  Chest - effort normal, all lung fields clear to auscultation bilaterally  Heart - normal rate and regular rhythm  Neck:  midline trachea, no thyromegaly   Breasts - breasts appear normal, no suspicious masses, no skin or nipple changes or  axillary nodes  Abdomen - soft, nontender, nondistended  Pelvic - VULVA: normal appearing vulva with no masses, tenderness or lesions  VAGINA: normal appearing vagina with normal color and discharge, no lesions  CERVIX: normal appearing cervix without discharge or lesions, no CMT  Thin prep pap is done w HR HPV cotesting  UTERUS: uterus is felt to be normal size, shape, consistency and nontender   ADNEXA: No adnexal masses or tenderness noted.  Extremities:  No swelling or varicosities noted  Chaperone present for exam  No results found for this or any previous visit (from the past 24 hours).   Assessment & Plan:  1. Encounter for annual routine gynecological examination (Primary) Pap today Discussed mood, she feels she is doing better on current medication regimen UTD on mammogram   2. Cervical cancer screening  - Cytology - PAP( [place])  3. Screen for STD (sexually transmitted disease)  - Cervicovaginal ancillary only( [place])  4. Dysuria 5. Acute cystitis without hematuria 6. Vaginal odor Will tx given symptoms and + leuks, neg for nitrites, sent for culture - Urine Culture - POCT Urinalysis Dipstick   Labs/procedures today:   Mammogram: in 1 year, or sooner if problems Colonoscopy: @ 53yo, or sooner if  problems  Orders Placed This Encounter  Procedures   Urine Culture   Ambulatory referral to Integrated Behavioral Health   POCT Urinalysis Dipstick    Meds:  Meds ordered this encounter  Medications   DISCONTD: nitrofurantoin , macrocrystal-monohydrate, (MACROBID ) 100 MG capsule    Sig: Take 1 capsule (100 mg total) by mouth 2 (two) times daily.    Dispense:  14 capsule     Refill:  0   fluconazole  (DIFLUCAN ) 150 MG tablet    Sig: Take 1 tablet (150 mg total) by mouth once for 1 dose. Can take additional dose three days later if symptoms persist    Dispense:  1 tablet    Refill:  1   metroNIDAZOLE  (FLAGYL ) 500 MG tablet    Sig: Take 1 tablet (500 mg total) by mouth 2 (two) times daily for 7 days.    Dispense:  14 tablet    Refill:  0   phenazopyridine  (PYRIDIUM ) 200 MG tablet    Sig: Take 1 tablet (200 mg total) by mouth 3 (three) times daily as needed for pain (urethral spasm).    Dispense:  10 tablet    Refill:  0   amoxicillin -clavulanate (AUGMENTIN ) 500-125 MG tablet    Sig: Take 1 tablet by mouth in the morning and at bedtime for 5 days.    Dispense:  10 tablet    Refill:  0    Follow-up: Return in about 1 year (around 02/06/2025) for Zoanne Hinders, FNP

## 2024-02-08 LAB — CERVICOVAGINAL ANCILLARY ONLY
Bacterial Vaginitis (gardnerella): NEGATIVE
Candida Glabrata: NEGATIVE
Candida Vaginitis: NEGATIVE
Comment: NEGATIVE
Comment: NEGATIVE
Comment: NEGATIVE

## 2024-02-08 LAB — CYTOLOGY - PAP
Adequacy: ABSENT
Comment: NEGATIVE
Diagnosis: NEGATIVE
High risk HPV: NEGATIVE

## 2024-02-09 ENCOUNTER — Ambulatory Visit: Payer: Self-pay | Admitting: Obstetrics and Gynecology

## 2024-02-09 NOTE — Progress Notes (Signed)
 PHQ 9 score of 13 and GAD 7 score of 21 noted on chart review. Urgent referral to [facility] placed.

## 2024-02-10 LAB — URINE CULTURE

## 2024-02-11 MED ORDER — AMOXICILLIN-POT CLAVULANATE 500-125 MG PO TABS: 1.0000 | ORAL_TABLET | Freq: Two times a day (BID) | ORAL | 0 refills | Status: AC

## 2024-02-16 ENCOUNTER — Ambulatory Visit

## 2024-02-21 DIAGNOSIS — F429 Obsessive-compulsive disorder, unspecified: Secondary | ICD-10-CM | POA: Diagnosis not present

## 2024-02-21 DIAGNOSIS — F3132 Bipolar disorder, current episode depressed, moderate: Secondary | ICD-10-CM | POA: Diagnosis not present

## 2024-02-21 DIAGNOSIS — F411 Generalized anxiety disorder: Secondary | ICD-10-CM | POA: Diagnosis not present

## 2024-02-21 DIAGNOSIS — F909 Attention-deficit hyperactivity disorder, unspecified type: Secondary | ICD-10-CM | POA: Diagnosis not present

## 2024-02-29 DIAGNOSIS — N3946 Mixed incontinence: Secondary | ICD-10-CM | POA: Diagnosis not present

## 2024-02-29 DIAGNOSIS — R350 Frequency of micturition: Secondary | ICD-10-CM | POA: Diagnosis not present

## 2024-03-01 ENCOUNTER — Ambulatory Visit (INDEPENDENT_AMBULATORY_CARE_PROVIDER_SITE_OTHER): Payer: Self-pay | Admitting: Licensed Clinical Social Worker

## 2024-03-01 DIAGNOSIS — F4323 Adjustment disorder with mixed anxiety and depressed mood: Secondary | ICD-10-CM

## 2024-03-01 NOTE — BH Specialist Note (Unsigned)
 Integrated Behavioral Health via Telemedicine Visit  03/05/2024 [name] [id number]  Number of Integrated Behavioral Health Clinician visits: 1- Initial Visit  Session Start time: 0814   Session End time: 0900  Total time in minutes: 46    Referring Provider: Nidia Daring Patient/Family location: At [facility] Provider location: Remote Office  All persons participating in visit: Patient and [facility] Types of Service: Individual psychotherapy and Video visit  I connected with [name] and/or [name]'s patient via  Telephone or Video Enabled Telemedicine Application  (Video is Caregility application) and verified that I am speaking with the correct person using two identifiers. Discussed confidentiality: Yes   I discussed the limitations of telemedicine and the availability of in person appointments.  Discussed there is a possibility of technology failure and discussed alternative modes of communication if that failure occurs.  I discussed that engaging in this telemedicine visit, they consent to the provision of behavioral healthcare and the services will be billed under their insurance.  Patient and/or legal guardian expressed understanding and consented to Telemedicine visit: Yes   Presenting Concerns: Patient and/or family reports the following symptoms/concerns: Increased relationship stressors Duration of problem: Months; Severity of problem: moderate  Patient and/or Family's Strengths/Protective Factors: Social and Emotional competence, Concrete supports in place (healthy food, safe environments, etc.), and Caregiver has knowledge of parenting & child development  Goals Addressed: Patient will:  Reduce symptoms of: depression and stress   Increase knowledge and/or ability of: coping skills, healthy habits, and self-management skills   Demonstrate ability to: Increase healthy adjustment to current life circumstances and Increase adequate support systems for  patient/family  Progress towards Goals: Ongoing    Interventions: Interventions utilized:  Solution-Focused Strategies, Mindfulness or Management consultant, Supportive Counseling, Psychoeducation and/or Health Education, and Supportive Reflection Standardized Assessments completed: Not Needed    Patient and/or Family Response: Patient was present and engaged in today's virtual session. She reported experiencing physical symptoms that she believes may be related to the onset of menopause, including painful and crampy menstrual periods, intermenstrual spotting, breast tenderness, increased body hair, and weight loss. She noted that she is not interested in pursuing hormone therapy at this time. Patient discussed ongoing stress related to her role within the household, including being solely responsible for cleaning, managing household tasks, and providing daily supervision for her grandson without support from her adult children. She explored feelings of overwhelm and frustration due to the lack of family assistance and ongoing relational stressors. During the session, the patient also reflected on her personal values and goals, expressing a desire to increase her engagement in activities that bring her joy, leave the house more frequently, and explore employment opportunities. Additionally, the patient shared recent efforts to set firmer, healthier boundaries with her adult children, while acknowledging the internal conflict and guilt that sometimes accompany these efforts. Therapist supported the patient in processing these emotions and reinforced the importance of prioritizing her well-being.  Clinical Assessment/Diagnosis  Adjustment disorder with mixed anxiety and depressed mood    Assessment: Patient currently experiencing both physical symptoms potentially related to menopause and emotional stress stemming from a lack of support in managing household and caregiving responsibilities. She  feels overwhelmed and is seeking greater balance, autonomy, and emotional relief.   Patient may benefit from continued behavioral health services.  Plan: Follow up with behavioral health clinician on : 03/06/24 Behavioral recommendations: patient is to continue to set and maintain healthy boundaries with her adult children to reduce caregiver  strain, and begin identifying small, achievable steps toward re-engaging with meaningful activities and potential employment. Exploring medical consultation for her physical symptoms may also be beneficial in managing her overall well-being. Referral(s): Integrated [facility] (In Clinic)  I discussed the assessment and treatment plan with the patient and/or parent/guardian. They were provided an opportunity to ask questions and all were answered. They agreed with the plan and demonstrated an understanding of the instructions.   They were advised to call back or seek an in-person evaluation if the symptoms worsen or if the condition fails to improve as anticipated.  [name], LCSWA

## 2024-03-06 ENCOUNTER — Ambulatory Visit: Payer: Self-pay | Admitting: Licensed Clinical Social Worker

## 2024-03-06 DIAGNOSIS — F411 Generalized anxiety disorder: Secondary | ICD-10-CM | POA: Diagnosis not present

## 2024-03-06 DIAGNOSIS — F909 Attention-deficit hyperactivity disorder, unspecified type: Secondary | ICD-10-CM | POA: Diagnosis not present

## 2024-03-06 DIAGNOSIS — F3132 Bipolar disorder, current episode depressed, moderate: Secondary | ICD-10-CM | POA: Diagnosis not present

## 2024-03-06 DIAGNOSIS — Z91199 Patient's noncompliance with other medical treatment and regimen due to unspecified reason: Secondary | ICD-10-CM

## 2024-03-06 DIAGNOSIS — F429 Obsessive-compulsive disorder, unspecified: Secondary | ICD-10-CM | POA: Diagnosis not present

## 2024-03-06 NOTE — BH Specialist Note (Signed)
 [facility] provided virtual link to patient on this date. [facility] contacted patient and left a message. Patient no showed for today's visit.

## 2024-03-20 ENCOUNTER — Other Ambulatory Visit: Payer: Self-pay | Admitting: Family Medicine

## 2024-03-20 DIAGNOSIS — E1165 Type 2 diabetes mellitus with hyperglycemia: Secondary | ICD-10-CM

## 2024-04-02 ENCOUNTER — Ambulatory Visit: Admitting: Orthopaedic Surgery

## 2024-04-02 ENCOUNTER — Encounter: Payer: Self-pay | Admitting: Orthopaedic Surgery

## 2024-04-02 ENCOUNTER — Other Ambulatory Visit (INDEPENDENT_AMBULATORY_CARE_PROVIDER_SITE_OTHER): Payer: Self-pay

## 2024-04-02 DIAGNOSIS — M25562 Pain in left knee: Secondary | ICD-10-CM

## 2024-04-02 DIAGNOSIS — N3946 Mixed incontinence: Secondary | ICD-10-CM | POA: Diagnosis not present

## 2024-04-02 MED ORDER — METHYLPREDNISOLONE ACETATE 40 MG/ML IJ SUSP
40.0000 mg | INTRAMUSCULAR | Status: AC | PRN
  Administered 2024-04-02: 40 mg via INTRA_ARTICULAR

## 2024-04-02 MED ORDER — BUPIVACAINE HCL 0.5 % IJ SOLN
2.0000 mL | INTRAMUSCULAR | Status: AC | PRN
  Administered 2024-04-02: 2 mL via INTRA_ARTICULAR

## 2024-04-02 MED ORDER — LIDOCAINE HCL 1 % IJ SOLN
2.0000 mL | INTRAMUSCULAR | Status: AC | PRN
  Administered 2024-04-02: 2 mL

## 2024-04-02 NOTE — Progress Notes (Signed)
 Office Visit Note   Patient: [name]           Date of Birth: 1971/08/12           MRN: [id number] Visit Date: 04/02/2024              Requested by: Berneta Elsie Sayre, MD [address] [place],  [place] [address] PCP: Berneta Elsie Sayre, MD   Assessment & Plan: Visit Diagnoses:  1. Acute pain of left knee     Plan: History of Present Illness [name] is a 53 year old female who presents with left knee pain following a fall.  She experiences pain localized to the front of her left knee after a fall. There is a pre-existing bump on the knee that is not tender. She describes a sensation in her calf as 'still sleeping.' She has lost 139 pounds through diet and exercise to manage diabetes and high blood pressure but has recently regained some weight, which she believes may contribute to her knee pain. She has difficulty sleeping on her hips, suspecting bursitis. She has not had an MRI on her knee before.  Exam of the left knee shows no joint effusion.  Decent range of motion without crepitus.  Because her cruciates are stable.  No joint line tenderness.  No motor or sensory deficits.  Results RADIOLOGY Knee X-ray: No fractures, minimal osteoarthritis, no effusion (04/02/2024)  Assessment and Plan Left knee pain - Administered knee injection for pain relief. - If pain persists after six weeks, order MRI to assess for internal damage.  Follow-Up Instructions: No follow-ups on file.   Orders:  Orders Placed This Encounter  Procedures   XR KNEE 3 VIEW LEFT   No orders of the defined types were placed in this encounter.     Procedures: Large Joint Inj: L knee on 04/02/2024 1:14 PM Details: 22 G needle Medications: 2 mL bupivacaine  0.5 %; 2 mL lidocaine  1 %; 40 mg methylPREDNISolone  acetate 40 MG/ML Outcome: tolerated well, no immediate complications Patient was prepped and draped in the usual sterile fashion.       Clinical Data: No  additional findings.   Subjective: Chief Complaint  Patient presents with   Left Knee - Follow-up    HPI  Review of Systems  Constitutional: Negative.   HENT: Negative.    Eyes: Negative.   Respiratory: Negative.    Cardiovascular: Negative.   Endocrine: Negative.   Musculoskeletal: Negative.   Neurological: Negative.   Hematological: Negative.   Psychiatric/Behavioral: Negative.    All other systems reviewed and are negative.    Objective: Vital Signs: There were no vitals taken for this visit.  Physical Exam Vitals and nursing note reviewed.  Constitutional:      Appearance: She is well-developed.  HENT:     Head: Atraumatic.     Nose: Nose normal.  Eyes:     Extraocular Movements: Extraocular movements intact.  Cardiovascular:     Pulses: Normal pulses.  Pulmonary:     Effort: Pulmonary effort is normal.  Abdominal:     Palpations: Abdomen is soft.  Musculoskeletal:     Cervical back: Neck supple.  Skin:    General: Skin is warm.     Capillary Refill: Capillary refill takes less than 2 seconds.  Neurological:     Mental Status: She is alert. Mental status is at baseline.  Psychiatric:        Behavior: Behavior normal.  Thought Content: Thought content normal.        Judgment: Judgment normal.     Ortho Exam  Specialty Comments:  No specialty comments available.  Imaging: XR KNEE 3 VIEW LEFT Result Date: 04/02/2024 X-rays of the left knee show no acute or structural abnormalities    PMFS History: Patient Active Problem List   Diagnosis Date Noted   Overweight (BMI 25.0-29.9) 11/06/2023   Inclusion cyst 02/23/2023   Hypotension 01/02/2023   Transportation insecurity 12/21/2022   Dehydration 12/21/2022   Menopause 12/21/2022   Abscess 03/30/2022   Amenorrhea 10/29/2021   Abnormal findings on diagnostic imaging of liver and biliary tract 10/13/2021   Colon cancer screening 10/13/2021   Constipation 10/13/2021   Gallstones, common  bile duct 10/13/2021   Gastroesophageal reflux disease 10/13/2021   History of hepatitis C 10/13/2021   Intestinal gas excretion 10/13/2021   Liver fibrosis 10/13/2021   Right upper quadrant pain 10/13/2021   Stress at home 06/21/2021   Need for influenza vaccination 06/21/2021   Periorbital hyperpigmentation 05/03/2021   Abnormal CBC 05/03/2021   Lower respiratory infection 02/04/2021   Hematuria 12/28/2020   Status post total replacement of left hip 10/12/2020   Peripheral edema 04/02/2020   COVID-19 07/09/2019   Left hip pain 04/30/2019   Otitis externa of left ear 04/30/2019   Cholesteatoma of left ear 04/30/2019   Dysfunction of left eustachian tube 04/30/2019   Left ear pain 04/30/2019   Sciatica 04/19/2019   Class 3 severe obesity due to excess calories with serious comorbidity and body mass index (BMI) of 45.0 to 49.9 in adult 04/19/2019   Post-nasal drip 01/10/2019   Allergic cough 01/10/2019   Obstructive sleep apnea syndrome 12/11/2018   Acute frontal sinusitis 12/10/2018   Primary osteoarthritis of left hip 11/13/2018   Morbid obesity (HCC) 11/13/2018   Elevated cholesterol 11/12/2018   Essential hypertension 11/12/2018   Allergic rhinitis due to pollen 11/12/2018   Reactive airway disease 11/12/2018   Breast pain, right 07/17/2018   Refused influenza vaccine 07/17/2018   Chronic nonintractable headache 02/12/2018   Dysuria 02/12/2018   Localized edema 02/12/2018   Hospital discharge follow-up 01/23/2018   Bladder problem 01/05/2018   Depression 01/05/2018   Insomnia 01/05/2018   Hepatitis C infection 12/01/2015   RLS (restless legs syndrome) 10/23/2015   Elevated liver enzymes 10/22/2015   Recurrent major depression-severe (HCC) 04/17/2015   GAD (generalized anxiety disorder) 04/15/2015   Bipolar depression (HCC) 04/15/2015   Severe recurrent major depression w/psychotic features, mood-congruent (HCC) 04/14/2015   Anxiety disorder 04/14/2015   Major  depressive disorder, recurrent severe without psychotic features (HCC) 04/14/2015   Suicidal ideations    Chronic low back pain 01/02/2015   Other fatigue 06/25/2012   Chronic arthralgias of knees and hips 06/25/2012   Leukocytosis 06/25/2012   Vitamin D deficiency 06/25/2012   Past Medical History:  Diagnosis Date   ADHD (attention deficit hyperactivity disorder)    Anemia    with pregnancy   Anxiety    Arthritis    Bipolar disorder (HCC)    Chronic back pain greater than 3 months duration    Depression    Diabetes mellitus without complication (HCC)    Diabetic nephropathy (HCC)    bilateral feet   Dysuria    has to have self in and out cath    Eroded bladder suspension mesh (HCC)    GERD (gastroesophageal reflux disease)    Hepatitis    C   History  of COVID-19 11/2019   History of MRSA infection    after back surgery   History of sleep apnea    resolved after weight loss   Hypertension    Hypothyroidism    Leg pain, bilateral    Obesity    Obsessive-compulsive disorder    Pneumonia 12/24/2019   Rosacea    Sciatic nerve pain    Social anxiety disorder    Squamous cell carcinoma of back 2021    Family History  Problem Relation Age of Onset   Schizophrenia Father    Schizophrenia Cousin    Hyperthyroidism Mother    Hypothyroidism Maternal Grandmother     Past Surgical History:  Procedure Laterality Date   BACK SURGERY     total of 6 back surgeries   bladder mesh  2009   DILITATION & CURRETTAGE/HYSTROSCOPY WITH NOVASURE ABLATION N/A 08/19/2016   Procedure: DILATATION & CURETTAGE WITH NOVASURE ABLATION;  Surgeon: Charlie Aho, MD;  Location: [facility];  Service: Gynecology;  Laterality: N/A;   EUS N/A 09/09/2015   Procedure: UPPER ENDOSCOPIC ULTRASOUND (EUS) RADIAL;  Surgeon: Elsie Cree, MD;  Location: [facility];  Service: Endoscopy;  Laterality: N/A;   EYE SURGERY     INCONTINENCE SURGERY     SPINAL CORD STIMULATOR INSERTION     SPINAL CORD STIMULATOR  REMOVAL     SPINAL FUSION  2008   times two   TOTAL HIP ARTHROPLASTY Left 10/12/2020   Procedure: LEFT TOTAL HIP ARTHROPLASTY ANTERIOR APPROACH;  Surgeon: Jerri Kay HERO, MD;  Location: [facility];  Service: Orthopedics;  Laterality: Left;   TUBAL LIGATION  2006   Social History   Occupational History   Occupation: disability  Tobacco Use   Smoking status: Former    Current packs/day: 0.00    Average packs/day: 1 pack/day for 15.0 years (15.0 ttl pk-yrs)    Types: Cigarettes    Start date: 10/04/2006    Quit date: 10/04/2021    Years since quitting: 2.4   Smokeless tobacco: Former   Tobacco comments:    use vapor, social  Vaping Use   Vaping status: Never Used  Substance and Sexual Activity   Alcohol  use: Yes    Comment: occ   Drug use: No   Sexual activity: Yes    Birth control/protection: Surgical

## 2024-04-05 DIAGNOSIS — F411 Generalized anxiety disorder: Secondary | ICD-10-CM | POA: Diagnosis not present

## 2024-04-05 DIAGNOSIS — F909 Attention-deficit hyperactivity disorder, unspecified type: Secondary | ICD-10-CM | POA: Diagnosis not present

## 2024-04-05 DIAGNOSIS — F3132 Bipolar disorder, current episode depressed, moderate: Secondary | ICD-10-CM | POA: Diagnosis not present

## 2024-04-05 DIAGNOSIS — F429 Obsessive-compulsive disorder, unspecified: Secondary | ICD-10-CM | POA: Diagnosis not present

## 2024-04-16 ENCOUNTER — Encounter: Payer: Self-pay | Admitting: Family Medicine

## 2024-04-16 ENCOUNTER — Ambulatory Visit (INDEPENDENT_AMBULATORY_CARE_PROVIDER_SITE_OTHER): Admitting: Family Medicine

## 2024-04-16 ENCOUNTER — Ambulatory Visit: Payer: Self-pay | Admitting: Family Medicine

## 2024-04-16 VITALS — BP 110/68 | HR 55 | Temp 97.8°F | Ht 64.0 in | Wt 188.4 lb

## 2024-04-16 DIAGNOSIS — R350 Frequency of micturition: Secondary | ICD-10-CM | POA: Diagnosis not present

## 2024-04-16 DIAGNOSIS — R232 Flushing: Secondary | ICD-10-CM | POA: Diagnosis not present

## 2024-04-16 LAB — POCT URINALYSIS DIPSTICK
Bilirubin, UA: NEGATIVE
Blood, UA: 1
Glucose, UA: NEGATIVE
Ketones, UA: NEGATIVE
Nitrite, UA: NEGATIVE
Protein, UA: NEGATIVE
Spec Grav, UA: 1.01 (ref 1.010–1.025)
Urobilinogen, UA: 0.2 U/dL
pH, UA: 6.5 (ref 5.0–8.0)

## 2024-04-16 LAB — FOLLICLE STIMULATING HORMONE: FSH: 44.6 m[IU]/mL

## 2024-04-16 MED ORDER — NITROFURANTOIN MONOHYD MACRO 100 MG PO CAPS: 100.0000 mg | ORAL_CAPSULE | Freq: Two times a day (BID) | ORAL | 0 refills | Status: AC

## 2024-04-16 NOTE — Progress Notes (Signed)
 Established Patient Office Visit   Subjective:  Patient ID: [name], female    DOB: Jan 24, 1971  Age: 53 y.o. MRN: [id number]  Chief Complaint  Patient presents with   Hot Flashes    X 4 days. Pt believes that she is gong through menopause.    Urinary Frequency    Urinary frequency and urgency.     Urinary Frequency  Associated symptoms include frequency and urgency. Pertinent negatives include no chills, nausea or vomiting.   Encounter Diagnoses  Name Primary?   Urinary frequency Yes   Hot flashes    Has been experiencing pain and hot flashes over the last 3 to 4 days.  No fevers or chills nausea or vomiting.  Last GYN check was 3 to 4 months ago.  2 to 3-day history of urinary frequency and urgency.  Status post urethral bulking procedure with gel 2 weeks ago.   Review of Systems  Constitutional: Negative.  Negative for chills and fever.  HENT: Negative.    Eyes:  Negative for blurred vision, discharge and redness.  Respiratory: Negative.    Cardiovascular: Negative.   Gastrointestinal:  Negative for abdominal pain, nausea and vomiting.  Genitourinary:  Positive for frequency and urgency.  Musculoskeletal: Negative.  Negative for myalgias.  Skin:  Negative for rash.  Neurological:  Negative for tingling, loss of consciousness and weakness.  Endo/Heme/Allergies:  Negative for polydipsia.     Current Outpatient Medications:    ALPRAZolam  (XANAX ) 0.5 MG tablet, Take 0.5 mg by mouth 3 (three) times daily as needed for anxiety., Disp: , Rfl:    ARIPiprazole  (ABILIFY ) 5 MG tablet, Take 1 tablet (5 mg total) by mouth daily., Disp: 30 tablet, Rfl: 2   aspirin -acetaminophen -caffeine (EXCEDRIN MIGRAINE) 250-250-65 MG tablet, Take 1 tablet by mouth every 6 (six) hours as needed for migraine., Disp: , Rfl:    atorvastatin  (LIPITOR) 20 MG tablet, TAKE 1 TABLET(20 MG) BY MOUTH DAILY, Disp: 100 tablet, Rfl: 3   blood glucose meter kit and supplies KIT, Dispense based on patient  and insurance preference. Check glucose before breakfast. ICD: E11.65, Disp: 1 each, Rfl: 0   diclofenac  Sodium (VOLTAREN ) 1 % GEL, APPLY TWO GRAMS TOPICALLY DAILY AS NEEDED, Disp: 200 g, Rfl: 0   doxepin (SINEQUAN) 25 MG capsule, Take 25 mg by mouth at bedtime., Disp: , Rfl:    FINACEA  15 % FOAM, Apply 1 application  topically 2 (two) times daily as needed (rosacea)., Disp: , Rfl:    glucose blood test strip, Use as instructed, check glucose before breakfast. ICD: E11.65, Disp: 100 each, Rfl: 12   lamoTRIgine  (LAMICTAL ) 150 MG tablet, Take 300 mg by mouth daily., Disp: , Rfl:    Lancets (FREESTYLE) lancets, Use as instructed. E11.65, Disp: 100 each, Rfl: 12   levothyroxine  (SYNTHROID ) 75 MCG tablet, Take 1 tablet (75 mcg total) by mouth daily before breakfast., Disp: 90 tablet, Rfl: 2   lidocaine  (XYLOCAINE ) 2 % solution, Use as directed 15 mLs in the mouth or throat every 3 (three) hours as needed for mouth pain. Swish and spit., Disp: 100 mL, Rfl: 0   nitrofurantoin , macrocrystal-monohydrate, (MACROBID ) 100 MG capsule, Take 1 capsule (100 mg total) by mouth 2 (two) times daily for 7 days., Disp: 14 capsule, Rfl: 0   pantoprazole  (PROTONIX ) 40 MG tablet, Take 1 tablet (40 mg total) by mouth daily., Disp: 90 tablet, Rfl: 1   phenazopyridine  (PYRIDIUM ) 200 MG tablet, Take 1 tablet (200 mg total) by mouth 3 (  three) times daily as needed for pain (urethral spasm)., Disp: 10 tablet, Rfl: 0   polyethylene glycol powder (GLYCOLAX /MIRALAX ) 17 GM/SCOOP powder, Take 17 g by mouth daily., Disp: 850 g, Rfl: 3   QUEtiapine (SEROQUEL) 50 MG tablet, Take 50 mg by mouth at bedtime., Disp: , Rfl:    Semaglutide ,0.25 or 0.5MG /DOS, (OZEMPIC , 0.25 OR 0.5 MG/DOSE,) 2 MG/3ML SOPN, INJECT 0.25 MG INTO THE SKIN ONCE A WEEK, Disp: 3 mL, Rfl: 2   fluconazole  (DIFLUCAN ) 150 MG tablet, Take 1 tablet (150 mg total) by mouth daily. (Patient not taking: Reported on 04/16/2024), Disp: 1 tablet, Rfl: 0   gabapentin  (NEURONTIN ) 300  MG capsule, TAKE ONE CAPSULE BY MOUTH THREE TIMES A DAY (Patient not taking: Reported on 04/16/2024), Disp: 90 capsule, Rfl: 1   methylphenidate  (RITALIN ) 20 MG tablet, Take 20 mg by mouth 2 (two) times daily. (Patient not taking: Reported on 04/16/2024), Disp: , Rfl:    mupirocin  ointment (BACTROBAN ) 2 %, Apply 1 Application topically 2 (two) times daily. (Patient not taking: Reported on 04/16/2024), Disp: 30 g, Rfl: 2   mupirocin  ointment (BACTROBAN ) 2 %, Apply 1 Application topically 2 (two) times daily. (Patient not taking: Reported on 04/16/2024), Disp: 30 g, Rfl: 2   zolpidem  (AMBIEN ) 5 MG tablet, Take 5 mg by mouth at bedtime as needed for sleep (Crystal Monatnew). (Patient not taking: Reported on 04/16/2024), Disp: , Rfl:    Objective:     BP 110/68 (BP Location: Right Arm, Patient Position: Sitting, Cuff Size: Normal)   Pulse (!) 55   Temp 97.8 F (36.6 C) (Temporal)   Ht 5' 4 (1.626 m)   Wt 188 lb 6.4 oz (85.5 kg)   SpO2 95%   BMI 32.34 kg/m  BP Readings from Last 3 Encounters:  04/16/24 110/68  02/07/24 112/74  01/30/24 114/64   Wt Readings from Last 3 Encounters:  04/16/24 188 lb 6.4 oz (85.5 kg)  02/07/24 186 lb 11.2 oz (84.7 kg)  01/30/24 184 lb 6.4 oz (83.6 kg)      Physical Exam Constitutional:      General: She is not in acute distress.    Appearance: Normal appearance. She is not ill-appearing, toxic-appearing or diaphoretic.  HENT:     Head: Normocephalic and atraumatic.     Right Ear: External ear normal.     Left Ear: External ear normal.     Mouth/Throat:     Mouth: Mucous membranes are moist.     Pharynx: Oropharynx is clear. No oropharyngeal exudate or posterior oropharyngeal erythema.  Eyes:     General: No scleral icterus.       Right eye: No discharge.        Left eye: No discharge.     Extraocular Movements: Extraocular movements intact.     Conjunctiva/sclera: Conjunctivae normal.     Pupils: Pupils are equal, round, and reactive to light.   Cardiovascular:     Rate and Rhythm: Normal rate and regular rhythm.  Pulmonary:     Effort: Pulmonary effort is normal. No respiratory distress.     Breath sounds: Normal breath sounds. No wheezing or rales.  Abdominal:     General: Bowel sounds are normal.     Tenderness: There is no right CVA tenderness or left CVA tenderness.  Musculoskeletal:     Cervical back: No rigidity or tenderness.  Skin:    General: Skin is warm and dry.  Neurological:     Mental Status: She is alert and  oriented to person, place, and time.  Psychiatric:        Mood and Affect: Mood normal.        Behavior: Behavior normal.      Results for orders placed or performed in visit on 04/16/24  POCT Urinalysis Dipstick  Result Value Ref Range   Color, UA pale    Clarity, UA clear    Glucose, UA Negative Negative   Bilirubin, UA neg    Ketones, UA neg    Spec Grav, UA 1.010 1.010 - 1.025   Blood, UA 1    pH, UA 6.5 5.0 - 8.0   Protein, UA Negative Negative   Urobilinogen, UA 0.2 0.2 or 1.0 E.U./dL   Nitrite, UA neg    Leukocytes, UA Trace (A) Negative   Appearance     Odor        The 10-year ASCVD risk score (Arnett DK, et al., 2019) is: 2.4%    Assessment & Plan:   Urinary frequency -     Urinalysis, Routine w reflex microscopic -     Urine Culture -     POCT urinalysis dipstick -     Nitrofurantoin  Monohyd Macro; Take 1 capsule (100 mg total) by mouth 2 (two) times daily for 7 days.  Dispense: 14 capsule; Refill: 0  Hot flashes -     Follicle stimulating hormone    Return if symptoms worsen or fail to improve, for chronic disease follow-up.  Nitrites negative but leukocyte esterase positive.  7 days of Macrobid  sent to pharmacy.  Checking [facility] for eval of hot flashes.  Elsie Sim Lent, MD

## 2024-04-17 LAB — URINALYSIS, ROUTINE W REFLEX MICROSCOPIC
Bilirubin Urine: NEGATIVE
Ketones, ur: NEGATIVE
Nitrite: NEGATIVE
Specific Gravity, Urine: <=1.005 — AB (ref 1.000–1.030)
Total Protein, Urine: NEGATIVE
Urine Glucose: NEGATIVE
Urobilinogen, UA: 0.2 (ref 0.0–1.0)
pH: 7 (ref 5.0–8.0)

## 2024-04-17 LAB — URINE CULTURE
MICRO NUMBER:: 16819893
Result:: NO GROWTH
SPECIMEN QUALITY:: ADEQUATE

## 2024-04-19 ENCOUNTER — Other Ambulatory Visit: Payer: Self-pay | Admitting: Family Medicine

## 2024-04-19 DIAGNOSIS — E039 Hypothyroidism, unspecified: Secondary | ICD-10-CM

## 2024-04-30 DIAGNOSIS — N3946 Mixed incontinence: Secondary | ICD-10-CM | POA: Diagnosis not present

## 2024-05-14 ENCOUNTER — Ambulatory Visit
Admission: RE | Admit: 2024-05-14 | Discharge: 2024-05-14 | Disposition: A | Discharge status: Home / Self Care | Source: Ambulatory Visit | Attending: Family Medicine | Admitting: Family Medicine

## 2024-05-14 VITALS — BP 121/79 | HR 84 | Temp 98.3°F | Resp 18

## 2024-05-14 DIAGNOSIS — J329 Chronic sinusitis, unspecified: Secondary | ICD-10-CM | POA: Diagnosis not present

## 2024-05-14 DIAGNOSIS — J40 Bronchitis, not specified as acute or chronic: Secondary | ICD-10-CM | POA: Diagnosis not present

## 2024-05-14 MED ORDER — AMOXICILLIN-POT CLAVULANATE 875-125 MG PO TABS: 1.0000 | ORAL_TABLET | Freq: Two times a day (BID) | ORAL | 0 refills | Status: DC

## 2024-05-14 MED ORDER — PROMETHAZINE-DM 6.25-15 MG/5ML PO SYRP: 5.0000 mL | ORAL_SOLUTION | Freq: Three times a day (TID) | ORAL | 0 refills | Status: DC | PRN

## 2024-05-14 MED ORDER — FLUCONAZOLE 150 MG PO TABS: 150.0000 mg | ORAL_TABLET | ORAL | 0 refills | Status: DC

## 2024-05-14 MED ORDER — PREDNISONE 10 MG PO TABS: 30.0000 mg | ORAL_TABLET | Freq: Every day | ORAL | 0 refills | Status: DC

## 2024-05-14 MED ORDER — ALBUTEROL SULFATE HFA 108 (90 BASE) MCG/ACT IN AERS: 1.0000 | INHALATION_SPRAY | Freq: Four times a day (QID) | RESPIRATORY_TRACT | 0 refills | Status: AC | PRN

## 2024-05-14 NOTE — ED Triage Notes (Signed)
 Pt reports cough, right side chest pressure when coughing, losing her voice and nasal drainage x 2 week. Dayquil gives no relief, makes her jittering.

## 2024-05-14 NOTE — Discharge Instructions (Signed)
 Will manage for sinobronchitis with Augmentin  and prednisone  primarily. Use albuterol  for wheezing as needed. Use cough syrup at bedtime. Take fluconazole  to help against yeast infections from these medications.

## 2024-05-14 NOTE — ED Provider Notes (Signed)
 [facility]  Note:  This document was prepared using Conservation officer, historic buildings and may include unintentional dictation errors.  MRN: [id number] DOB: 01-31-1971  Subjective:   [name] is a 53 y.o. female presenting for 2-week history of persistent sinus drainage, hoarseness, throat pain, coughing, chest pressure with her coughing. No history of asthma. Used to smoke 1ppd.  Quit smoking about a year ago.  Has had difficulty with bronchitis in the past.  No current facility-administered medications for this encounter.  Current Outpatient Medications:    ALPRAZolam  (XANAX ) 0.5 MG tablet, Take 0.5 mg by mouth 3 (three) times daily as needed for anxiety., Disp: , Rfl:    ARIPiprazole  (ABILIFY ) 5 MG tablet, Take 1 tablet (5 mg total) by mouth daily., Disp: 30 tablet, Rfl: 2   aspirin -acetaminophen -caffeine (EXCEDRIN MIGRAINE) 250-250-65 MG tablet, Take 1 tablet by mouth every 6 (six) hours as needed for migraine., Disp: , Rfl:    atorvastatin  (LIPITOR) 20 MG tablet, TAKE 1 TABLET(20 MG) BY MOUTH DAILY, Disp: 100 tablet, Rfl: 3   blood glucose meter kit and supplies KIT, Dispense based on patient and insurance preference. Check glucose before breakfast. ICD: E11.65, Disp: 1 each, Rfl: 0   diclofenac  Sodium (VOLTAREN ) 1 % GEL, APPLY TWO GRAMS TOPICALLY DAILY AS NEEDED, Disp: 200 g, Rfl: 0   doxepin (SINEQUAN) 25 MG capsule, Take 25 mg by mouth at bedtime., Disp: , Rfl:    FINACEA  15 % FOAM, Apply 1 application  topically 2 (two) times daily as needed (rosacea)., Disp: , Rfl:    fluconazole  (DIFLUCAN ) 150 MG tablet, Take 1 tablet (150 mg total) by mouth daily. (Patient not taking: Reported on 04/16/2024), Disp: 1 tablet, Rfl: 0   gabapentin  (NEURONTIN ) 300 MG capsule, TAKE ONE CAPSULE BY MOUTH THREE TIMES A DAY (Patient not taking: Reported on 04/16/2024), Disp: 90 capsule, Rfl: 1   glucose blood test strip, Use as instructed, check glucose before breakfast. ICD:  E11.65, Disp: 100 each, Rfl: 12   lamoTRIgine  (LAMICTAL ) 150 MG tablet, Take 300 mg by mouth daily., Disp: , Rfl:    Lancets (FREESTYLE) lancets, Use as instructed. E11.65, Disp: 100 each, Rfl: 12   levothyroxine  (SYNTHROID ) 75 MCG tablet, TAKE 1 TABLET (75 MCG TOTAL) BY MOUTH DAILY BEFORE BREAKFAST, Disp: 90 tablet, Rfl: 1   lidocaine  (XYLOCAINE ) 2 % solution, Use as directed 15 mLs in the mouth or throat every 3 (three) hours as needed for mouth pain. Swish and spit., Disp: 100 mL, Rfl: 0   methylphenidate  (RITALIN ) 20 MG tablet, Take 20 mg by mouth 2 (two) times daily. (Patient not taking: Reported on 04/16/2024), Disp: , Rfl:    mupirocin  ointment (BACTROBAN ) 2 %, Apply 1 Application topically 2 (two) times daily. (Patient not taking: Reported on 04/16/2024), Disp: 30 g, Rfl: 2   mupirocin  ointment (BACTROBAN ) 2 %, Apply 1 Application topically 2 (two) times daily. (Patient not taking: Reported on 04/16/2024), Disp: 30 g, Rfl: 2   pantoprazole  (PROTONIX ) 40 MG tablet, Take 1 tablet (40 mg total) by mouth daily., Disp: 90 tablet, Rfl: 1   phenazopyridine  (PYRIDIUM ) 200 MG tablet, Take 1 tablet (200 mg total) by mouth 3 (three) times daily as needed for pain (urethral spasm)., Disp: 10 tablet, Rfl: 0   polyethylene glycol powder (GLYCOLAX /MIRALAX ) 17 GM/SCOOP powder, Take 17 g by mouth daily., Disp: 850 g, Rfl: 3   QUEtiapine (SEROQUEL) 50 MG tablet, Take 50 mg by mouth at bedtime., Disp: , Rfl:  Semaglutide ,0.25 or 0.5MG /DOS, (OZEMPIC , 0.25 OR 0.5 MG/DOSE,) 2 MG/3ML SOPN, INJECT 0.25 MG INTO THE SKIN ONCE A WEEK, Disp: 3 mL, Rfl: 2   zolpidem  (AMBIEN ) 5 MG tablet, Take 5 mg by mouth at bedtime as needed for sleep (Crystal Monatnew). (Patient not taking: Reported on 04/16/2024), Disp: , Rfl:    Allergies  Allergen Reactions   Pregabalin Anaphylaxis   Bupropion Hcl Other (See Comments)   Celecoxib Nausea And Vomiting and Other (See Comments)    Stomach pain   Effexor [Venlafaxine Hcl] Other (See  Comments)    Serotonin toxicity   Oxybutynin  Other (See Comments)    Urinary retention   Pramipexole Other (See Comments)    Mirapex- Worsening body jerks   Pramipexole Dihydrochloride Other (See Comments)   Pregabalin Other (See Comments)   Prozac [Fluoxetine Hcl] Swelling   Symbyax [Olanzapine-Fluoxetine Hcl] Swelling   Venlafaxine Other (See Comments)   Desvenlafaxine Other (See Comments) and Rash    Serotonin toxicity    Past Medical History:  Diagnosis Date   ADHD (attention deficit hyperactivity disorder)    Anemia    with pregnancy   Anxiety    Arthritis    Bipolar disorder (HCC)    Chronic back pain greater than 3 months duration    Depression    Diabetes mellitus without complication (HCC)    Diabetic nephropathy (HCC)    bilateral feet   Dysuria    has to have self in and out cath    Eroded bladder suspension mesh (HCC)    GERD (gastroesophageal reflux disease)    Hepatitis    C   History of COVID-19 11/2019   History of MRSA infection    after back surgery   History of sleep apnea    resolved after weight loss   Hypertension    Hypothyroidism    Leg pain, bilateral    Obesity    Obsessive-compulsive disorder    Pneumonia 12/24/2019   Rosacea    Sciatic nerve pain    Social anxiety disorder    Squamous cell carcinoma of back 2021     Past Surgical History:  Procedure Laterality Date   BACK SURGERY     total of 6 back surgeries   bladder mesh  2009   DILITATION & CURRETTAGE/HYSTROSCOPY WITH NOVASURE ABLATION N/A 08/19/2016   Procedure: DILATATION & CURETTAGE WITH NOVASURE ABLATION;  Surgeon: Charlie Aho, MD;  Location: [facility];  Service: Gynecology;  Laterality: N/A;   EUS N/A 09/09/2015   Procedure: UPPER ENDOSCOPIC ULTRASOUND (EUS) RADIAL;  Surgeon: Elsie Cree, MD;  Location: [facility];  Service: Endoscopy;  Laterality: N/A;   EYE SURGERY     INCONTINENCE SURGERY     SPINAL CORD STIMULATOR INSERTION     SPINAL CORD STIMULATOR REMOVAL      SPINAL FUSION  2008   times two   TOTAL HIP ARTHROPLASTY Left 10/12/2020   Procedure: LEFT TOTAL HIP ARTHROPLASTY ANTERIOR APPROACH;  Surgeon: Jerri Kay HERO, MD;  Location: [facility];  Service: Orthopedics;  Laterality: Left;   TUBAL LIGATION  2006    Family History  Problem Relation Age of Onset   Schizophrenia Father    Schizophrenia Cousin    Hyperthyroidism Mother    Hypothyroidism Maternal Grandmother     Social History   Tobacco Use   Smoking status: Former    Current packs/day: 0.00    Average packs/day: 1 pack/day for 15.0 years (15.0 ttl pk-yrs)    Types: Cigarettes  Start date: 10/04/2006    Quit date: 10/04/2021    Years since quitting: 2.6   Smokeless tobacco: Former   Tobacco comments:    use vapor, social  Vaping Use   Vaping status: Never Used  Substance Use Topics   Alcohol  use: Yes    Comment: occ   Drug use: No    ROS   Objective:   Vitals: BP 121/79 (BP Location: Left Arm)   Pulse 84   Temp 98.3 F (36.8 C) (Oral)   Resp 18   SpO2 95%   Physical Exam Constitutional:      General: She is not in acute distress.    Appearance: Normal appearance. She is well-developed and normal weight. She is not ill-appearing, toxic-appearing or diaphoretic.  HENT:     Head: Normocephalic and atraumatic.     Right Ear: Tympanic membrane, ear canal and external ear normal. No drainage or tenderness. No middle ear effusion. There is no impacted cerumen. Tympanic membrane is not erythematous or bulging.     Left Ear: Tympanic membrane, ear canal and external ear normal. No drainage or tenderness.  No middle ear effusion. There is no impacted cerumen. Tympanic membrane is not erythematous or bulging.     Nose: Nose normal. No congestion or rhinorrhea.     Mouth/Throat:     Mouth: Mucous membranes are moist. No oral lesions.     Pharynx: No pharyngeal swelling, oropharyngeal exudate, posterior oropharyngeal erythema or uvula swelling.     Tonsils: No tonsillar  exudate or tonsillar abscesses.  Eyes:     General: No scleral icterus.       Right eye: No discharge.        Left eye: No discharge.     Extraocular Movements: Extraocular movements intact.     Right eye: Normal extraocular motion.     Left eye: Normal extraocular motion.     Conjunctiva/sclera: Conjunctivae normal.  Cardiovascular:     Rate and Rhythm: Normal rate and regular rhythm.     Heart sounds: Normal heart sounds. No murmur heard.    No friction rub. No gallop.  Pulmonary:     Effort: Pulmonary effort is normal. No respiratory distress.     Breath sounds: No stridor. Rhonchi (trace over mid lung fields bilaterally) present. No wheezing or rales.  Chest:     Chest wall: No tenderness.  Musculoskeletal:     Cervical back: Normal range of motion and neck supple.  Lymphadenopathy:     Cervical: No cervical adenopathy.  Skin:    General: Skin is warm and dry.  Neurological:     General: No focal deficit present.     Mental Status: She is alert and oriented to person, place, and time.  Psychiatric:        Mood and Affect: Mood normal.        Behavior: Behavior normal.     Assessment and Plan :   PDMP not reviewed this encounter.  1. Sinobronchitis    Recommended treating sinobronchitis with Augmentin  and prednisone .  Use albuterol  for wheezing and lower respiratory symptoms.  Fluconazole  for antibiotic and steroid associated yeast infection.  Supportive care otherwise.  Will defer imaging for now.  Counseled patient on potential for adverse effects with medications prescribed/recommended today, ER and return-to-clinic precautions discussed, patient verbalized understanding.    Christopher Savannah, [place] 05/14/24 TRENNA

## 2024-05-23 DIAGNOSIS — R350 Frequency of micturition: Secondary | ICD-10-CM | POA: Diagnosis not present

## 2024-05-23 DIAGNOSIS — N3946 Mixed incontinence: Secondary | ICD-10-CM | POA: Diagnosis not present

## 2024-06-03 ENCOUNTER — Other Ambulatory Visit (HOSPITAL_COMMUNITY)
Admission: RE | Admit: 2024-06-03 | Discharge: 2024-06-03 | Disposition: A | Discharge status: Home / Self Care | Source: Ambulatory Visit | Attending: Internal Medicine | Admitting: Internal Medicine

## 2024-06-03 ENCOUNTER — Other Ambulatory Visit: Payer: Self-pay

## 2024-06-03 ENCOUNTER — Encounter: Payer: Self-pay | Admitting: Internal Medicine

## 2024-06-03 ENCOUNTER — Ambulatory Visit: Admitting: Internal Medicine

## 2024-06-03 ENCOUNTER — Ambulatory Visit: Payer: Self-pay

## 2024-06-03 VITALS — BP 124/80 | HR 81 | Temp 98.0°F | Ht 64.0 in | Wt 195.8 lb

## 2024-06-03 DIAGNOSIS — R300 Dysuria: Secondary | ICD-10-CM | POA: Insufficient documentation

## 2024-06-03 DIAGNOSIS — E1165 Type 2 diabetes mellitus with hyperglycemia: Secondary | ICD-10-CM

## 2024-06-03 LAB — POC URINALSYSI DIPSTICK (AUTOMATED)
Glucose, UA: POSITIVE — AB
Nitrite, UA: POSITIVE
Protein, UA: NEGATIVE
Spec Grav, UA: 1.015 (ref 1.010–1.025)
Urobilinogen, UA: 2 U/dL — AB
pH, UA: 5 (ref 5.0–8.0)

## 2024-06-03 MED ORDER — OZEMPIC (0.25 OR 0.5 MG/DOSE) 2 MG/3ML ~~LOC~~ SOPN: 0.2500 mg | PEN_INJECTOR | SUBCUTANEOUS | 2 refills | Status: DC

## 2024-06-03 MED ORDER — SULFAMETHOXAZOLE-TRIMETHOPRIM 800-160 MG PO TABS: 1.0000 | ORAL_TABLET | Freq: Two times a day (BID) | ORAL | 0 refills | Status: AC

## 2024-06-03 NOTE — Progress Notes (Signed)
 [facility] [address] [place] [address] Dept: [phone] Dept Fax: [phone]  Acute Care Office Visit  Subjective:   [name] November 25, 1970 06/03/2024  No chief complaint on file.   HPI:  URINARY SYMPTOMS Onset: 2 days ago  - Patient was recently on Augmentin  for bronchitis on 05/14/24 by UC. Was also given diflucan  , but has not taken it.   Fever/chills: no Dysuria: yes Urinary frequency: yes Urgency: yes Foul odor: yes Hematuria: no Suprapubic pain/pressure: yes Flank/low back pain: no Nausea/Vomiting: yes - nausea  Vaginal discharge: no - patient is concerned about BV. Would like to be checked for this as well.   Treatments tried: AZO - no relief  Previous urinary tract infection: yes History of sexually transmitted disease: several years ago when teenager   The following portions of the patient's history were reviewed and updated as appropriate: past medical history, past surgical history, family history, social history, allergies, medications, and problem list.   Patient Active Problem List   Diagnosis Date Noted   Overweight (BMI 25.0-29.9) 11/06/2023   Inclusion cyst 02/23/2023   Hypotension 01/02/2023   Transportation insecurity 12/21/2022   Dehydration 12/21/2022   Menopause 12/21/2022   Abscess 03/30/2022   Amenorrhea 10/29/2021   Abnormal findings on diagnostic imaging of liver and biliary tract 10/13/2021   Colon cancer screening 10/13/2021   Constipation 10/13/2021   Gallstones, common bile duct 10/13/2021   Gastroesophageal reflux disease 10/13/2021   History of hepatitis C 10/13/2021   Intestinal gas excretion 10/13/2021   Liver fibrosis 10/13/2021   Right upper quadrant pain 10/13/2021   Stress at home 06/21/2021   Need for influenza vaccination 06/21/2021   Periorbital hyperpigmentation 05/03/2021   Abnormal CBC 05/03/2021   Lower respiratory infection 02/04/2021    Hematuria 12/28/2020   Status post total replacement of left hip 10/12/2020   Peripheral edema 04/02/2020   COVID-19 07/09/2019   Left hip pain 04/30/2019   Otitis externa of left ear 04/30/2019   Cholesteatoma of left ear 04/30/2019   Dysfunction of left eustachian tube 04/30/2019   Left ear pain 04/30/2019   Sciatica 04/19/2019   Class 3 severe obesity due to excess calories with serious comorbidity and body mass index (BMI) of 45.0 to 49.9 in adult 04/19/2019   Post-nasal drip 01/10/2019   Allergic cough 01/10/2019   Obstructive sleep apnea syndrome 12/11/2018   Acute frontal sinusitis 12/10/2018   Primary osteoarthritis of left hip 11/13/2018   Morbid obesity (HCC) 11/13/2018   Elevated cholesterol 11/12/2018   Essential hypertension 11/12/2018   Allergic rhinitis due to pollen 11/12/2018   Reactive airway disease 11/12/2018   Breast pain, right 07/17/2018   Refused influenza vaccine 07/17/2018   Chronic nonintractable headache 02/12/2018   Dysuria 02/12/2018   Localized edema 02/12/2018   Hospital discharge follow-up 01/23/2018   Bladder problem 01/05/2018   Depression 01/05/2018   Insomnia 01/05/2018   Hepatitis C infection 12/01/2015   RLS (restless legs syndrome) 10/23/2015   Elevated liver enzymes 10/22/2015   Recurrent major depression-severe (HCC) 04/17/2015   GAD (generalized anxiety disorder) 04/15/2015   Bipolar depression (HCC) 04/15/2015   Severe recurrent major depression w/psychotic features, mood-congruent (HCC) 04/14/2015   Anxiety disorder 04/14/2015   Major depressive disorder, recurrent severe without psychotic features (HCC) 04/14/2015   Suicidal ideations    Chronic low back pain 01/02/2015   Other fatigue 06/25/2012   Chronic arthralgias of knees and hips 06/25/2012   Leukocytosis  06/25/2012   Vitamin D deficiency 06/25/2012   Past Medical History:  Diagnosis Date   ADHD (attention deficit hyperactivity disorder)    Anemia    with pregnancy    Anxiety    Arthritis    Bipolar disorder (HCC)    Chronic back pain greater than 3 months duration    Depression    Diabetes mellitus without complication (HCC)    Diabetic nephropathy (HCC)    bilateral feet   Dysuria    has to have self in and out cath    Eroded bladder suspension mesh    GERD (gastroesophageal reflux disease)    Hepatitis    C   History of COVID-19 11/2019   History of MRSA infection    after back surgery   History of sleep apnea    resolved after weight loss   Hypertension    Hypothyroidism    Leg pain, bilateral    Obesity    Obsessive-compulsive disorder    Pneumonia 12/24/2019   Rosacea    Sciatic nerve pain    Social anxiety disorder    Squamous cell carcinoma of back 2021   Past Surgical History:  Procedure Laterality Date   BACK SURGERY     total of 6 back surgeries   bladder mesh  2009   DILITATION & CURRETTAGE/HYSTROSCOPY WITH NOVASURE ABLATION N/A 08/19/2016   Procedure: DILATATION & CURETTAGE WITH NOVASURE ABLATION;  Surgeon: Charlie Aho, MD;  Location: [facility];  Service: Gynecology;  Laterality: N/A;   EUS N/A 09/09/2015   Procedure: UPPER ENDOSCOPIC ULTRASOUND (EUS) RADIAL;  Surgeon: Elsie Cree, MD;  Location: [facility];  Service: Endoscopy;  Laterality: N/A;   EYE SURGERY     INCONTINENCE SURGERY     SPINAL CORD STIMULATOR INSERTION     SPINAL CORD STIMULATOR REMOVAL     SPINAL FUSION  2008   times two   TOTAL HIP ARTHROPLASTY Left 10/12/2020   Procedure: LEFT TOTAL HIP ARTHROPLASTY ANTERIOR APPROACH;  Surgeon: Jerri Kay HERO, MD;  Location: [facility];  Service: Orthopedics;  Laterality: Left;   TUBAL LIGATION  2006   Family History  Problem Relation Age of Onset   Schizophrenia Father    Schizophrenia Cousin    Hyperthyroidism Mother    Hypothyroidism Maternal Grandmother     Current Outpatient Medications:    albuterol  (VENTOLIN  HFA) 108 (90 Base) MCG/ACT inhaler, Inhale 1-2 puffs into the lungs every 6 (six) hours as  needed for wheezing or shortness of breath., Disp: 18 g, Rfl: 0   ALPRAZolam  (XANAX ) 0.5 MG tablet, Take 0.5 mg by mouth 3 (three) times daily as needed for anxiety., Disp: , Rfl:    ARIPiprazole  (ABILIFY ) 5 MG tablet, Take 1 tablet (5 mg total) by mouth daily., Disp: 30 tablet, Rfl: 2   aspirin -acetaminophen -caffeine (EXCEDRIN MIGRAINE) 250-250-65 MG tablet, Take 1 tablet by mouth every 6 (six) hours as needed for migraine., Disp: , Rfl:    atorvastatin  (LIPITOR) 20 MG tablet, TAKE 1 TABLET(20 MG) BY MOUTH DAILY, Disp: 100 tablet, Rfl: 3   blood glucose meter kit and supplies KIT, Dispense based on patient and insurance preference. Check glucose before breakfast. ICD: E11.65, Disp: 1 each, Rfl: 0   diclofenac  Sodium (VOLTAREN ) 1 % GEL, APPLY TWO GRAMS TOPICALLY DAILY AS NEEDED, Disp: 200 g, Rfl: 0   FINACEA  15 % FOAM, Apply 1 application  topically 2 (two) times daily as needed (rosacea)., Disp: , Rfl:    glucose blood test strip, Use  as instructed, check glucose before breakfast. ICD: E11.65, Disp: 100 each, Rfl: 12   lamoTRIgine  (LAMICTAL ) 150 MG tablet, Take 300 mg by mouth daily., Disp: , Rfl:    Lancets (FREESTYLE) lancets, Use as instructed. E11.65, Disp: 100 each, Rfl: 12   levothyroxine  (SYNTHROID ) 75 MCG tablet, TAKE 1 TABLET (75 MCG TOTAL) BY MOUTH DAILY BEFORE BREAKFAST, Disp: 90 tablet, Rfl: 1   lidocaine  (XYLOCAINE ) 2 % solution, Use as directed 15 mLs in the mouth or throat every 3 (three) hours as needed for mouth pain. Swish and spit., Disp: 100 mL, Rfl: 0   pantoprazole  (PROTONIX ) 40 MG tablet, Take 1 tablet (40 mg total) by mouth daily., Disp: 90 tablet, Rfl: 1   polyethylene glycol powder (GLYCOLAX /MIRALAX ) 17 GM/SCOOP powder, Take 17 g by mouth daily., Disp: 850 g, Rfl: 3   Semaglutide ,0.25 or 0.5MG /DOS, (OZEMPIC , 0.25 OR 0.5 MG/DOSE,) 2 MG/3ML SOPN, INJECT 0.25 MG INTO THE SKIN ONCE A WEEK, Disp: 3 mL, Rfl: 2   sulfamethoxazole -trimethoprim  (BACTRIM  DS) 800-160 MG tablet, Take 1  tablet by mouth 2 (two) times daily for 7 days., Disp: 14 tablet, Rfl: 0   doxepin (SINEQUAN) 25 MG capsule, Take 25 mg by mouth at bedtime. (Patient not taking: Reported on 06/03/2024), Disp: , Rfl:    fluconazole  (DIFLUCAN ) 150 MG tablet, Take 1 tablet (150 mg total) by mouth every 3 (three) days. (Patient not taking: Reported on 06/03/2024), Disp: 3 tablet, Rfl: 0   gabapentin  (NEURONTIN ) 300 MG capsule, TAKE ONE CAPSULE BY MOUTH THREE TIMES A DAY (Patient not taking: Reported on 04/16/2024), Disp: 90 capsule, Rfl: 1   methylphenidate  (RITALIN ) 20 MG tablet, Take 20 mg by mouth 2 (two) times daily. (Patient not taking: Reported on 04/16/2024), Disp: , Rfl:    mupirocin  ointment (BACTROBAN ) 2 %, Apply 1 Application topically 2 (two) times daily. (Patient not taking: Reported on 04/16/2024), Disp: 30 g, Rfl: 2   mupirocin  ointment (BACTROBAN ) 2 %, Apply 1 Application topically 2 (two) times daily. (Patient not taking: Reported on 04/16/2024), Disp: 30 g, Rfl: 2   QUEtiapine (SEROQUEL) 50 MG tablet, Take 50 mg by mouth at bedtime., Disp: , Rfl:    zolpidem  (AMBIEN ) 5 MG tablet, Take 5 mg by mouth at bedtime as needed for sleep (Crystal Monatnew). (Patient not taking: Reported on 04/16/2024), Disp: , Rfl:  Allergies  Allergen Reactions   Pregabalin Anaphylaxis   Bupropion Hcl Other (See Comments)   Celecoxib Nausea And Vomiting and Other (See Comments)    Stomach pain   Effexor [Venlafaxine Hcl] Other (See Comments)    Serotonin toxicity   Oxybutynin  Other (See Comments)    Urinary retention   Pramipexole Other (See Comments)    Mirapex- Worsening body jerks   Pramipexole Dihydrochloride Other (See Comments)   Pregabalin Other (See Comments)   Prozac [Fluoxetine Hcl] Swelling   Symbyax [Olanzapine-Fluoxetine Hcl] Swelling   Venlafaxine Other (See Comments)   Desvenlafaxine Other (See Comments) and Rash    Serotonin toxicity     ROS: A complete ROS was performed with pertinent  positives/negatives noted in the HPI. The remainder of the ROS are negative.    Objective:   Today's Vitals   06/03/24 1004  BP: 124/80  Pulse: 81  Temp: 98 F (36.7 C)  TempSrc: Temporal  SpO2: 96%  Weight: 195 lb 12.8 oz (88.8 kg)  Height: 5' 4 (1.626 m)    GENERAL: Well-appearing, in NAD. Well nourished.  SKIN: Pink, warm and dry.  NECK: Trachea  midline. Full ROM w/o pain or tenderness. No lymphadenopathy.  RESPIRATORY: Chest wall symmetrical. Respirations even and non-labored. Breath sounds clear to auscultation bilaterally.  CARDIAC: S1, S2 present, regular rate and rhythm. Peripheral pulses 2+ bilaterally.  GI: Abdomen soft, non-tender. Normoactive bowel sounds. No rebound tenderness. No hepatomegaly or splenomegaly. No CVA tenderness.  PSYCH/MENTAL STATUS: Alert, oriented x 3. Cooperative, appropriate mood and affect.    Results for orders placed or performed in visit on 06/03/24  POCT Urinalysis Dipstick (Automated)  Result Value Ref Range   Color, UA     Clarity, UA     Glucose, UA Positive (A) Negative   Bilirubin, UA 3+    Ketones, UA 5+    Spec Grav, UA 1.015 1.010 - 1.025   Blood, UA 1+    pH, UA 5.0 5.0 - 8.0   Protein, UA Negative Negative   Urobilinogen, UA 2.0 (A) 0.2 or 1.0 E.U./dL   Nitrite, UA positive    Leukocytes, UA Large (3+) (A) Negative      Assessment & Plan:   Dysuria -     POCT Urinalysis Dipstick (Automated) -     Urine Culture -     Cervicovaginal ancillary only -     Sulfamethoxazole -Trimethoprim ; Take 1 tablet by mouth 2 (two) times daily for 7 days.  Dispense: 14 tablet; Refill: 0   Meds ordered this encounter  Medications   sulfamethoxazole -trimethoprim  (BACTRIM  DS) 800-160 MG tablet    Sig: Take 1 tablet by mouth 2 (two) times daily for 7 days.    Dispense:  14 tablet    Refill:  0    Supervising Provider:   SEBASTIAN BEVERLEY NOVAK [[id number]]   Orders Placed This Encounter  Procedures   Urine Culture   POCT Urinalysis  Dipstick (Automated)   Lab Orders         Urine Culture         POCT Urinalysis Dipstick (Automated)     No images are attached to the encounter or orders placed in the encounter.  Return if symptoms worsen or fail to improve.   Rosina Senters, FNP

## 2024-06-03 NOTE — Telephone Encounter (Signed)
 FYI Only or Action Required?: FYI only for provider.  Patient was last seen in primary care on 04/16/2024 by Claudia Elsie Sayre, MD.  Called Nurse Triage reporting Dysuria.  Symptoms began several days ago.  Interventions attempted: OTC medications: AZO.  Symptoms are: gradually worsening.  Triage Disposition: See HCP Within 4 Hours (Or PCP Triage)  Patient/caregiver understands and will follow disposition?: Yes Reason for Disposition  [1] SEVERE pain with urination (e.g., excruciating) AND [2] not improved after 2 hours of pain medicine  Side (flank) or lower back pain present  Answer Assessment - Initial Assessment Questions Burning with urination, abdominal pain and flank pain - urine is malodorous that began 2 days ago. Patient has been taking Azo.   1. SEVERITY: How bad is the pain?  (e.g., Scale 1-10; mild, moderate, or severe)     10/10  4. ONSET: When did the painful urination start?      2 days  5. FEVER: Do you have a fever? If Yes, ask: What is your temperature, how was it measured, and when did it start?     Denies  6. PAST UTI:      Yes, states was seen recently at [facility] for one  Protocols used: Urination Pain - [facility] Copied from CRM #[id number]. Topic: Clinical - Red Word Triage >> Jun 03, 2024  9:01 AM [name] wrote: Red Word that prompted transfer to Nurse Triage: Patient is calling with UTI symptoms that started on Saturday. Experiencing painful urination.

## 2024-06-04 ENCOUNTER — Ambulatory Visit: Payer: Self-pay | Admitting: Internal Medicine

## 2024-06-04 DIAGNOSIS — B9689 Other specified bacterial agents as the cause of diseases classified elsewhere: Secondary | ICD-10-CM

## 2024-06-04 LAB — CERVICOVAGINAL ANCILLARY ONLY
Bacterial Vaginitis (gardnerella): POSITIVE — AB
Candida Glabrata: NEGATIVE
Candida Vaginitis: NEGATIVE
Chlamydia: NEGATIVE
Comment: NEGATIVE
Comment: NEGATIVE
Comment: NEGATIVE
Comment: NEGATIVE
Comment: NEGATIVE
Comment: NORMAL
Neisseria Gonorrhea: NEGATIVE
Trichomonas: NEGATIVE

## 2024-06-04 MED ORDER — METRONIDAZOLE 0.75 % VA GEL: 1.0000 | Freq: Every day | VAGINAL | 0 refills | Status: DC

## 2024-06-05 LAB — URINE CULTURE
MICRO NUMBER:: 17029694
SPECIMEN QUALITY:: ADEQUATE

## 2024-06-06 ENCOUNTER — Ambulatory Visit: Payer: Self-pay

## 2024-06-06 NOTE — Telephone Encounter (Signed)
 This RN made first attempt to reach patient. Left message wit call back number.   Copied from CRM [id number]. Topic: Clinical - Medication Question >> Jun 06, 2024  9:58 AM Martinique E wrote: Reason for CRM: Patient stated that she was started on a gel for bacterial vaginosis and it is making her quite itchy in that region. Patient questioning if a different prescription for BV could get sent in instead.

## 2024-06-06 NOTE — Telephone Encounter (Signed)
   FYI Only or Action Required?: Action required by provider: Requesting different medication.  Patient was last seen in primary care on 06/03/2024 by [name], FNP.  Called Nurse Triage reporting Medication Reaction.  Symptoms began yesterday.  Interventions attempted: Nothing.  Symptoms are: stable.  Triage Disposition: Home Care  Patient/caregiver understands and will follow disposition?: YesReason for Disposition  Mild localized itching  Answer Assessment - Initial Assessment Questions Patient started Metrogel  yesterday and experiencing moderate itching, would like something else to be called in, she would rather pills. Please advise.  1. DESCRIPTION: Describe the itching you are having. Where is it located?     Vaginal area  2. SEVERITY: How bad is it?      Moderate  3. SCRATCHING: Are there any scratch marks? Bleeding?     No  4. ONSET: When did the itching begin? (e.g., minutes, hours, days ago)     Yesterday started Metrogel  and itching started  5. CAUSE: What do you think is causing the itching?      Metrogel   6. OTHER SYMPTOMS: Do you have any other symptoms? (e.g., fever, rash)     No  Protocols used: Itching - Localized-A-AH

## 2024-06-07 ENCOUNTER — Telehealth: Payer: Self-pay | Admitting: Orthopaedic Surgery

## 2024-06-07 NOTE — Telephone Encounter (Signed)
 Pt is wanting pill form for the cream given.

## 2024-06-07 NOTE — Telephone Encounter (Signed)
 Pt called stating that she is wanting a referral for an MRI.Pt call back number is [phone].

## 2024-06-10 ENCOUNTER — Other Ambulatory Visit: Payer: Self-pay

## 2024-06-10 DIAGNOSIS — M25562 Pain in left knee: Secondary | ICD-10-CM

## 2024-06-10 MED ORDER — METRONIDAZOLE 500 MG PO TABS: 500.0000 mg | ORAL_TABLET | Freq: Two times a day (BID) | ORAL | 0 refills | Status: AC

## 2024-06-10 NOTE — Addendum Note (Signed)
 Addended by: [name] on: 06/10/2024 11:37 AM   Modules accepted: Orders

## 2024-06-10 NOTE — Telephone Encounter (Signed)
Ordered. Notified patient via [facility].

## 2024-06-10 NOTE — Telephone Encounter (Signed)
 approve

## 2024-06-21 ENCOUNTER — Ambulatory Visit: Payer: Self-pay

## 2024-06-21 NOTE — Telephone Encounter (Signed)
 FYI Only or Action Required?: Action required: requesting referral.  Patient was last seen in primary care on 06/03/2024 by Claudia Knee, FNP.  Called Nurse Triage reporting Pain and cough.  Symptoms began several months ago.  Interventions attempted: OTC medications: Advil  and Prescription medications: promethazine -DM syrup.  Symptoms are: whole body pain (joint/bone pain) gradually worsening. Productive cough with clear mucous, nasal congestion since yesterday.  Triage Disposition: See PCP Within 2 Weeks (overriding See HCP Within 4 Hours (Or PCP Triage))  Patient/caregiver understands and will follow disposition?: Unsure            Copied from CRM #[id number]. Topic: Clinical - Red Word Triage >> Jun 21, 2024 11:00 AM [name] wrote: Patient having pain in her bones and would like a referral to a bone doctor Reason for Disposition  [1] SEVERE pain AND [2] taking a statin medicine (a lipid or cholesterol lowering drug)  Answer Assessment - Initial Assessment Questions Patient states she is out right now and does not have her calendar in front of her and she will call back to schedule. She states she did not think this required an office visit with PCP. Advised due to pain worsening and request for referral RN advises visit with PCP.   1. ONSET: When did the muscle aches or body pains start?      Body pain, describes as bone or joint pain. She states she has been experiencing it over several months. Saw PCP 01/30/24 in office for joint pain.  2. LOCATION: What part of your body is hurting? (e.g., entire body, arms, legs)      All over, joint pain. She states it feels like bone/joint pain.  3. SEVERITY: How bad is the pain? (Scale 1-10; or mild, moderate, severe)     10/10. She states Advil  takes the edge off a little bit.  4. CAUSE: What do you think is causing the pains?     She states she has been tested for rheumatoid arthritis and it was negative. She states  she is also concerned it could be bone cancer.  5. FEVER: Do you have a fever? If Yes, ask: What is your temperature, how was it measured, and  when did it start?      Yes. Yesterday she states the highest her temperature was 99 degrees.  6. OTHER SYMPTOMS: Do you have any other symptoms? (e.g., chest pain, cold or flu symptoms, rash, weakness, weight loss)     Nasal congestion, productive cough with clear mucous since yesterday. She has been treating with promothezine DM syrup. She adds she has had weight gain, feels cold and would like thyroid  checked. Denies chest pain, difficulty breathing, weight loss, rash, weakness.  7. PREGNANCY: Is there any chance you are pregnant? When was your last menstrual period?     N/A.  8. TRAVEL: Have you traveled out of the country in the last month? (e.g., exposures, travel history)     No travel out of the country. She states her boyfriend and her grandson both had colds and she thinks she caught that from them.  Protocols used: Muscle Aches and Body Pain-A-AH

## 2024-06-22 ENCOUNTER — Other Ambulatory Visit

## 2024-06-24 ENCOUNTER — Ambulatory Visit
Admission: RE | Admit: 2024-06-24 | Discharge: 2024-06-24 | Disposition: A | Discharge status: Home / Self Care | Source: Ambulatory Visit | Attending: Nurse Practitioner | Admitting: Nurse Practitioner

## 2024-06-24 ENCOUNTER — Ambulatory Visit: Admitting: Radiology

## 2024-06-24 ENCOUNTER — Ambulatory Visit: Payer: Self-pay

## 2024-06-24 VITALS — BP 118/82 | HR 81 | Temp 97.7°F | Resp 17

## 2024-06-24 DIAGNOSIS — R051 Acute cough: Secondary | ICD-10-CM | POA: Diagnosis not present

## 2024-06-24 DIAGNOSIS — R0602 Shortness of breath: Secondary | ICD-10-CM | POA: Diagnosis not present

## 2024-06-24 DIAGNOSIS — R059 Cough, unspecified: Secondary | ICD-10-CM | POA: Diagnosis not present

## 2024-06-24 DIAGNOSIS — J206 Acute bronchitis due to rhinovirus: Secondary | ICD-10-CM

## 2024-06-24 MED ORDER — METHYLPREDNISOLONE SODIUM SUCC 125 MG IJ SOLR
80.0000 mg | Freq: Once | INTRAMUSCULAR | Status: AC
  Administered 2024-06-24: 80 mg via INTRAMUSCULAR

## 2024-06-24 MED ORDER — DOXYCYCLINE HYCLATE 100 MG PO CAPS: 100.0000 mg | ORAL_CAPSULE | Freq: Two times a day (BID) | ORAL | 0 refills | Status: DC

## 2024-06-24 MED ORDER — IPRATROPIUM-ALBUTEROL 0.5-2.5 (3) MG/3ML IN SOLN
3.0000 mL | Freq: Once | RESPIRATORY_TRACT | Status: AC
  Administered 2024-06-24: 3 mL via RESPIRATORY_TRACT

## 2024-06-24 MED ORDER — BENZONATATE 200 MG PO CAPS: 200.0000 mg | ORAL_CAPSULE | Freq: Three times a day (TID) | ORAL | 0 refills | Status: DC | PRN

## 2024-06-24 MED ORDER — MUCINEX DM MAXIMUM STRENGTH 60-1200 MG PO TB12: 1.0000 | ORAL_TABLET | Freq: Two times a day (BID) | ORAL | 0 refills | Status: DC

## 2024-06-24 NOTE — Telephone Encounter (Signed)
  FYI Only or Action Required?: Action required by provider: update on patient condition.  Patient was last seen in primary care on 06/03/2024 by [name], FNP.  Called Nurse Triage reporting Cough.  Symptoms began about a month ago.  Interventions attempted: Prescription medications: albuterol  mdi.  Symptoms are: gradually worsening.  Triage Disposition: See HCP Within 4 Hours (Or PCP Triage)  Patient/caregiver understands and will follow disposition?: Yes  Copied from CRM #[id number]. Topic: Clinical - Red Word Triage >> Jun 24, 2024 12:57 PM Deaijah H wrote: Red Word that prompted transfer to Nurse Triage:  got diagnosed with bronchitis at [facility] Wheezing, and coughing up brown mucus Reason for Disposition  [1] MILD difficulty breathing (e.g., minimal/no SOB at rest, SOB with walking, pulse < 100) AND [2] still present when not coughing  Answer Assessment - Initial Assessment Questions 1. ONSET: When did the cough begin?      One month 2. SEVERITY: How bad is the cough today?      worsening 3. SPUTUM: Describe the color of your sputum (e.g., none, dry cough; clear, white, yellow, green)     Tan colored sputum 4. HEMOPTYSIS: Are you coughing up any blood? If Yes, ask: How much? (e.g., flecks, streaks, tablespoons, etc.)     denies 5. DIFFICULTY BREATHING: Are you having difficulty breathing? If Yes, ask: How bad is it? (e.g., mild, moderate, severe)      Reports that she is breathing shallow because deep breathing makes her cough 6. FEVER: Do you have a fever? If Yes, ask: What is your temperature, how was it measured, and when did it start?     99 with chills 7. CARDIAC HISTORY: Do you have any history of heart disease? (e.g., heart attack, congestive heart failure)      denis 8. LUNG HISTORY: Do you have any history of lung disease?  (e.g., pulmonary embolus, asthma, emphysema)     Denies, former smoker 9. PE RISK FACTORS: Do you have a history of blood  clots? (or: recent major surgery, recent prolonged travel, bedridden)     N/a 10. OTHER SYMPTOMS: Do you have any other symptoms? (e.g., runny nose, wheezing, chest pain)       Positive for wheezing 11. PREGNANCY: Is there any chance you are pregnant? When was your last menstrual period?       N/a 12. TRAVEL: Have you traveled out of the country in the last month? (e.g., travel history, exposures)       denies  Protocols used: Cough - Acute Productive-A-AH

## 2024-06-24 NOTE — Discharge Instructions (Addendum)
 Your evaluation today suggests that your symptoms are most consistent with acute bronchitis, which is an inflammation of the airways often caused by a lingering infection or irritation. Your chest X-ray did not show any signs of pneumonia or other lung problems, which is reassuring. You received a breathing treatment (DuoNeb) and a steroid injection (Solu-Medrol ) in the clinic to help open your airways and decrease inflammation. You were prescribed doxycyline to help clear any possible bacterial infection, Mucinex  DM to loosen mucus and make your cough more productive, and Tessalon  Perles to help calm your cough as needed. You may continue using your Promethazine  DM at night and your albuterol  inhaler as needed for shortness of breath or wheezing. Take all medications exactly as prescribed, even if you begin to feel better before finishing them. To help your recovery, rest as much as possible and stay hydrated by drinking plenty of water  throughout the day. Warm tea with honey, using a humidifier, and breathing in steam from a hot shower may help relieve congestion and coughing. Avoid secondhand smoke, vaping, strong odors, or cold air, as these can make your symptoms worse. Follow up with your primary care provider if your symptoms do not start improving within one week, or if your cough lasts longer than three weeks. You should go to the emergency department immediately if you develop trouble breathing, chest pain, high fever, coughing up blood, or if your symptoms suddenly worsen.

## 2024-06-24 NOTE — Telephone Encounter (Signed)
 Contacted pt to give her a appt. Pt is scheduled to see UC today at 7pm and she states she will update up as needed.

## 2024-06-24 NOTE — ED Provider Notes (Signed)
 [facility]    CSN: [id number] Arrival date & time: 06/24/24  1857      History   Chief Complaint Chief Complaint  Patient presents with   Cough    Entered by patient    HPI [name] is a 53 y.o. female.   Discussed the use of AI scribe software for clinical note transcription with the patient, who gave verbal consent to proceed.   Patient presents with persistent cough, shortness of breath, chest congestion, and fatigue ongoing for more than one month. She was previously evaluated on September 9th for similar symptoms after being ill for approximately two weeks and was diagnosed with sinobronchitis. At that time, she was prescribed Augmentin  and oral steroids. She completed the antibiotic course without improvement and initially delayed taking the prescribed steroids due to dislike of prednisone  but began taking them today.  She reports wheezing and difficulty taking a deep breath, along with occasional low-grade fevers around 61F. Nasal congestion is minimal, though she continues to have a runny nose. She experienced mild sore throat this morning that has since resolved. She denies nausea, vomiting, diarrhea, or headaches.  The patient has been using her albuterol  inhaler over the past few days and takes her prescribed cough medication at night as needed. She is a former smoker who has quit and denies any current smoking or vaping.  The following sections of the patient's history were reviewed and updated as appropriate: allergies, current medications, past family history, past medical history, past social history, past surgical history, and problem list.     Past Medical History:  Diagnosis Date   ADHD (attention deficit hyperactivity disorder)    Anemia    with pregnancy   Anxiety    Arthritis    Bipolar disorder (HCC)    Chronic back pain greater than 3 months duration    Depression    Diabetes mellitus without complication (HCC)    Diabetic  nephropathy (HCC)    bilateral feet   Dysuria    has to have self in and out cath    Eroded bladder suspension mesh    GERD (gastroesophageal reflux disease)    Hepatitis    C   History of COVID-19 11/2019   History of MRSA infection    after back surgery   History of sleep apnea    resolved after weight loss   Hypertension    Hypothyroidism    Leg pain, bilateral    Obesity    Obsessive-compulsive disorder    Pneumonia 12/24/2019   Rosacea    Sciatic nerve pain    Social anxiety disorder    Squamous cell carcinoma of back 2021    Patient Active Problem List   Diagnosis Date Noted   Overweight (BMI 25.0-29.9) 11/06/2023   Inclusion cyst 02/23/2023   Hypotension 01/02/2023   Transportation insecurity 12/21/2022   Dehydration 12/21/2022   Menopause 12/21/2022   Abscess 03/30/2022   Amenorrhea 10/29/2021   Abnormal findings on diagnostic imaging of liver and biliary tract 10/13/2021   Colon cancer screening 10/13/2021   Constipation 10/13/2021   Gallstones, common bile duct 10/13/2021   Gastroesophageal reflux disease 10/13/2021   History of hepatitis C 10/13/2021   Intestinal gas excretion 10/13/2021   Liver fibrosis 10/13/2021   Right upper quadrant pain 10/13/2021   Stress at home 06/21/2021   Need for influenza vaccination 06/21/2021   Periorbital hyperpigmentation 05/03/2021   Abnormal CBC 05/03/2021   Lower respiratory infection 02/04/2021  Hematuria 12/28/2020   Status post total replacement of left hip 10/12/2020   Peripheral edema 04/02/2020   COVID-19 07/09/2019   Left hip pain 04/30/2019   Otitis externa of left ear 04/30/2019   Cholesteatoma of left ear 04/30/2019   Dysfunction of left eustachian tube 04/30/2019   Left ear pain 04/30/2019   Sciatica 04/19/2019   Class 3 severe obesity due to excess calories with serious comorbidity and body mass index (BMI) of 45.0 to 49.9 in adult (HCC) 04/19/2019   Post-nasal drip 01/10/2019   Allergic cough  01/10/2019   Obstructive sleep apnea syndrome 12/11/2018   Acute frontal sinusitis 12/10/2018   Primary osteoarthritis of left hip 11/13/2018   Morbid obesity (HCC) 11/13/2018   Elevated cholesterol 11/12/2018   Essential hypertension 11/12/2018   Allergic rhinitis due to pollen 11/12/2018   Reactive airway disease 11/12/2018   Breast pain, right 07/17/2018   Refused influenza vaccine 07/17/2018   Chronic nonintractable headache 02/12/2018   Dysuria 02/12/2018   Localized edema 02/12/2018   Hospital discharge follow-up 01/23/2018   Bladder problem 01/05/2018   Depression 01/05/2018   Insomnia 01/05/2018   Hepatitis C infection 12/01/2015   RLS (restless legs syndrome) 10/23/2015   Elevated liver enzymes 10/22/2015   Recurrent major depression-severe (HCC) 04/17/2015   GAD (generalized anxiety disorder) 04/15/2015   Bipolar depression (HCC) 04/15/2015   Severe recurrent major depression w/psychotic features, mood-congruent (HCC) 04/14/2015   Anxiety disorder 04/14/2015   Major depressive disorder, recurrent severe without psychotic features (HCC) 04/14/2015   Suicidal ideations    Chronic low back pain 01/02/2015   Other fatigue 06/25/2012   Chronic arthralgias of knees and hips 06/25/2012   Leukocytosis 06/25/2012   Vitamin D deficiency 06/25/2012    Past Surgical History:  Procedure Laterality Date   BACK SURGERY     total of 6 back surgeries   bladder mesh  2009   DILITATION & CURRETTAGE/HYSTROSCOPY WITH NOVASURE ABLATION N/A 08/19/2016   Procedure: DILATATION & CURETTAGE WITH NOVASURE ABLATION;  Surgeon: Charlie Aho, MD;  Location: [facility];  Service: Gynecology;  Laterality: N/A;   EUS N/A 09/09/2015   Procedure: UPPER ENDOSCOPIC ULTRASOUND (EUS) RADIAL;  Surgeon: Elsie Cree, MD;  Location: [facility];  Service: Endoscopy;  Laterality: N/A;   EYE SURGERY     INCONTINENCE SURGERY     SPINAL CORD STIMULATOR INSERTION     SPINAL CORD STIMULATOR REMOVAL     SPINAL  FUSION  2008   times two   TOTAL HIP ARTHROPLASTY Left 10/12/2020   Procedure: LEFT TOTAL HIP ARTHROPLASTY ANTERIOR APPROACH;  Surgeon: Jerri Kay HERO, MD;  Location: [facility];  Service: Orthopedics;  Laterality: Left;   TUBAL LIGATION  2006    OB History     Gravida  3   Para      Term      Preterm      AB  1   Living  2      SAB      IAB  1   Ectopic      Multiple      Live Births  2            Home Medications    Prior to Admission medications   Medication Sig Start Date End Date Taking? Authorizing Provider  benzonatate  (TESSALON ) 200 MG capsule Take 1 capsule (200 mg total) by mouth 3 (three) times daily as needed for cough. 06/24/24  Yes Darelle Kings, FNP  Dextromethorphan-guaiFENesin  (MUCINEX  DM  MAXIMUM STRENGTH) 60-1200 MG TB12 Take 1 tablet by mouth 2 (two) times daily. 06/24/24  Yes Iola Lukes, FNP  doxycycline  (VIBRAMYCIN ) 100 MG capsule Take 1 capsule (100 mg total) by mouth 2 (two) times daily for 7 days. 06/24/24 07/01/24 Yes Iola Lukes, FNP  albuterol  (VENTOLIN  HFA) 108 (90 Base) MCG/ACT inhaler Inhale 1-2 puffs into the lungs every 6 (six) hours as needed for wheezing or shortness of breath. 05/14/24   Christopher Savannah, PA-C  ALPRAZolam  (XANAX ) 0.5 MG tablet Take 0.5 mg by mouth 3 (three) times daily as needed for anxiety. 12/20/19   [provider]  ARIPiprazole  (ABILIFY ) 5 MG tablet Take 1 tablet (5 mg total) by mouth daily. 09/17/15   Waddell Elna BIRCH, MD  aspirin -acetaminophen -caffeine (EXCEDRIN MIGRAINE) 250-250-65 MG tablet Take 1 tablet by mouth every 6 (six) hours as needed for migraine.    [provider]  atorvastatin  (LIPITOR) 20 MG tablet TAKE 1 TABLET(20 MG) BY MOUTH DAILY 07/13/23   Berneta Elsie Sayre, MD  blood glucose meter kit and supplies KIT Dispense based on patient and insurance preference. Check glucose before breakfast. ICD: E11.65 04/02/20   Nche, Roselie Rockford, NP  diclofenac  Sodium (VOLTAREN ) 1 %  GEL APPLY TWO GRAMS TOPICALLY DAILY AS NEEDED 12/08/23   Berneta Elsie Sayre, MD  doxepin (SINEQUAN) 25 MG capsule Take 25 mg by mouth at bedtime. Patient not taking: Reported on 06/03/2024 01/24/24   [provider]  FINACEA  15 % FOAM Apply 1 application  topically 2 (two) times daily as needed (rosacea). 02/14/20   [provider]  fluconazole  (DIFLUCAN ) 150 MG tablet Take 1 tablet (150 mg total) by mouth every 3 (three) days. Patient not taking: Reported on 06/03/2024 05/14/24   Christopher Savannah, PA-C  gabapentin  (NEURONTIN ) 300 MG capsule TAKE ONE CAPSULE BY MOUTH THREE TIMES A DAY Patient not taking: Reported on 04/16/2024 01/08/24   Berneta Elsie Sayre, MD  glucose blood test strip Use as instructed, check glucose before breakfast. ICD: E11.65 07/21/20   Berneta Elsie Sayre, MD  lamoTRIgine  (LAMICTAL ) 150 MG tablet Take 300 mg by mouth daily. 02/22/20   [provider]  Lancets (FREESTYLE) lancets Use as instructed. E11.65 07/21/20   Berneta Elsie Sayre, MD  levothyroxine  (SYNTHROID ) 75 MCG tablet TAKE 1 TABLET (75 MCG TOTAL) BY MOUTH DAILY BEFORE BREAKFAST 04/19/24   Berneta Elsie Sayre, MD  lidocaine  (XYLOCAINE ) 2 % solution Use as directed 15 mLs in the mouth or throat every 3 (three) hours as needed for mouth pain. Swish and spit. 05/14/23   Blitch, Marval HERO, NP  methylphenidate  (RITALIN ) 20 MG tablet Take 20 mg by mouth 2 (two) times daily. Patient not taking: Reported on 04/16/2024 09/22/20   [provider]  mupirocin  ointment (BACTROBAN ) 2 % Apply 1 Application topically 2 (two) times daily. Patient not taking: Reported on 04/16/2024 12/15/23   Gershon Donnice SAUNDERS, DPM  mupirocin  ointment (BACTROBAN ) 2 % Apply 1 Application topically 2 (two) times daily. Patient not taking: Reported on 04/16/2024 12/21/23   Gershon Donnice SAUNDERS, DPM  pantoprazole  (PROTONIX ) 40 MG tablet Take 1 tablet (40 mg total) by mouth daily. 07/13/23   Berneta Elsie Sayre, MD   polyethylene glycol powder (GLYCOLAX /MIRALAX ) 17 GM/SCOOP powder Take 17 g by mouth daily. 11/30/22   Nche, Roselie Rockford, NP  QUEtiapine (SEROQUEL) 50 MG tablet Take 50 mg by mouth at bedtime. 03/06/24   [provider]  Semaglutide ,0.25 or 0.5MG /DOS, (OZEMPIC , 0.25 OR 0.5 MG/DOSE,) 2 MG/3ML SOPN Inject 0.25  mg into the skin once a week. 06/03/24   Berneta Elsie Sayre, MD  zolpidem  (AMBIEN ) 5 MG tablet Take 5 mg by mouth at bedtime as needed for sleep (Crystal Monatnew). Patient not taking: Reported on 04/16/2024    [provider]    Family History Family History  Problem Relation Age of Onset   Schizophrenia Father    Schizophrenia Cousin    Hyperthyroidism Mother    Hypothyroidism Maternal Grandmother     Social History Social History   Tobacco Use   Smoking status: Former    Current packs/day: 0.00    Average packs/day: 1 pack/day for 15.0 years (15.0 ttl pk-yrs)    Types: Cigarettes    Start date: 10/04/2006    Quit date: 10/04/2021    Years since quitting: 2.7   Smokeless tobacco: Former   Tobacco comments:    use vapor, social  Vaping Use   Vaping status: Never Used  Substance Use Topics   Alcohol  use: Yes    Comment: occ   Drug use: No     Allergies   Pregabalin, Bupropion hcl, Celecoxib, Effexor [venlafaxine hcl], Oxybutynin , Pramipexole, Pramipexole dihydrochloride, Pregabalin, Prozac [fluoxetine hcl], Symbyax [olanzapine-fluoxetine hcl], Venlafaxine, and Desvenlafaxine   Review of Systems Review of Systems  Constitutional:  Positive for fatigue and fever (low grade, TMAX 99.0).  HENT:  Positive for rhinorrhea and sore throat (mild upon waking up but resolved). Negative for congestion and sneezing.   Respiratory:  Positive for cough, shortness of breath and wheezing.        Chest congestion; hard to take in deep breaths    Gastrointestinal:  Negative for diarrhea, nausea and vomiting.  Neurological:  Positive for headaches.  All other  systems reviewed and are negative.    Physical Exam Triage Vital Signs ED Triage Vitals  Encounter Vitals Group     BP 06/24/24 1920 118/82     Girls Systolic BP Percentile --      Girls Diastolic BP Percentile --      Boys Systolic BP Percentile --      Boys Diastolic BP Percentile --      Pulse Rate 06/24/24 1920 81     Resp 06/24/24 1920 17     Temp 06/24/24 1920 97.7 F (36.5 C)     Temp Source 06/24/24 1920 Oral     SpO2 06/24/24 1920 93 %     Weight --      Height --      Head Circumference --      Peak Flow --      Pain Score 06/24/24 1926 0     Pain Loc --      Pain Education --      Exclude from Growth Chart --    No data found.  Updated Vital Signs BP 118/82   Pulse 81   Temp 97.7 F (36.5 C) (Oral)   Resp 17   SpO2 93%   Visual Acuity Right Eye Distance:   Left Eye Distance:   Bilateral Distance:    Right Eye Near:   Left Eye Near:    Bilateral Near:     Physical Exam Vitals reviewed.  Constitutional:      General: She is awake. She is not in acute distress.    Appearance: Normal appearance. She is well-developed. She is not ill-appearing, toxic-appearing or diaphoretic.  HENT:     Head: Normocephalic.     Right Ear: Tympanic membrane, ear canal and external  ear normal. No drainage, swelling or tenderness. No middle ear effusion. Tympanic membrane is not erythematous.     Left Ear: Tympanic membrane, ear canal and external ear normal. No drainage, swelling or tenderness.  No middle ear effusion. Tympanic membrane is not erythematous.     Nose: Congestion present. No rhinorrhea.     Mouth/Throat:     Lips: Pink.     Mouth: Mucous membranes are moist.     Pharynx: No pharyngeal swelling, oropharyngeal exudate, posterior oropharyngeal erythema or uvula swelling.     Tonsils: No tonsillar exudate or tonsillar abscesses.  Eyes:     General: Vision grossly intact.     Conjunctiva/sclera: Conjunctivae normal.  Cardiovascular:     Rate and Rhythm:  Normal rate.     Heart sounds: Normal heart sounds.  Pulmonary:     Effort: Pulmonary effort is normal. No tachypnea or respiratory distress.     Breath sounds: Normal air entry. Wheezing present.     Comments: Diffused wheezing noted through all lung fields. Respirations even and unlabored. No acute distress noted.  Musculoskeletal:        General: Normal range of motion.     Cervical back: Normal range of motion and neck supple.  Lymphadenopathy:     Cervical: No cervical adenopathy.  Skin:    General: Skin is warm and dry.  Neurological:     General: No focal deficit present.     Mental Status: She is alert and oriented to person, place, and time.  Psychiatric:        Behavior: Behavior is cooperative.      UC Treatments / Results  Labs (all labs ordered are listed, but only abnormal results are displayed) Labs Reviewed - No data to display  EKG   Radiology DG Chest 2 View Result Date: 06/24/2024 CLINICAL DATA:  Cough and shortness of breath for 1 month, initial encounter EXAM: CHEST - 2 VIEW COMPARISON:  10/28/2022 FINDINGS: The heart size and mediastinal contours are within normal limits. Both lungs are clear. The visualized skeletal structures show degenerative change of thoracic spine. IMPRESSION: No active cardiopulmonary disease. Electronically Signed   By: Oneil Devonshire M.D.   On: 06/24/2024 19:52    Procedures Procedures (including critical care time)  Medications Ordered in UC Medications  methylPREDNISolone  sodium succinate (SOLU-MEDROL ) 125 mg/2 mL injection 80 mg (80 mg Intramuscular Given 06/24/24 1945)  ipratropium-albuterol  (DUONEB) 0.5-2.5 (3) MG/3ML nebulizer solution 3 mL (3 mLs Nebulization Given 06/24/24 1947)    Initial Impression / Assessment and Plan / UC Course  I have reviewed the triage vital signs and the nursing notes.  Pertinent labs & imaging results that were available during my care of the patient were reviewed by me and considered in  my medical decision making (see chart for details).     Patient presents with a one-month history of persistent cough, shortness of breath, chest congestion, and fatigue. She was previously treated with Augmentin  and prescribed steroids for sinobronchitis without improvement. Patient is afebrile and nontoxic on exam. Physical examination reveals mild nasal congestion and diffuse wheezing throughout all lung fields. Respirations are even and unlabored, and the patient is in no acute distress. DuoNeb treatment and Solu-Medrol  80 mg IM were administered in clinic with noted improvement in breath sounds. Chest X-ray shows no evidence of active cardiopulmonary disease. Clinical picture consistent with acute bronchitis. Doxycycline  prescribed to address possible secondary bacterial component. Oral steroids deferred at patient's request. Supportive therapy initiated including Mucinex   DM twice daily, Tessalon  Perles as needed for cough, and continued use of Promethazine  DM as needed. Patient may continue using albuterol  inhaler as required for shortness of breath or wheezing. Advised to avoid secondhand smoke and other respiratory irritants. Follow up with PCP if symptoms do not improve or worsen. Emergency precautions discussed.  Today's evaluation has revealed no signs of a dangerous process. Discussed diagnosis with patient and/or guardian. Patient and/or guardian aware of their diagnosis, possible red flag symptoms to watch out for and need for close follow up. Patient and/or guardian understands verbal and written discharge instructions. Patient and/or guardian comfortable with plan and disposition.  Patient and/or guardian has a clear mental status at this time, good insight into illness (after discussion and teaching) and has clear judgment to make decisions regarding their care  Documentation was completed with the aid of voice recognition software. Transcription may contain typographical errors.   Final  Clinical Impressions(s) / UC Diagnoses   Final diagnoses:  Acute cough  Acute bronchitis due to Rhinovirus     Discharge Instructions      Your evaluation today suggests that your symptoms are most consistent with acute bronchitis, which is an inflammation of the airways often caused by a lingering infection or irritation. Your chest X-ray did not show any signs of pneumonia or other lung problems, which is reassuring. You received a breathing treatment (DuoNeb) and a steroid injection (Solu-Medrol ) in the clinic to help open your airways and decrease inflammation. You were prescribed doxycyline to help clear any possible bacterial infection, Mucinex  DM to loosen mucus and make your cough more productive, and Tessalon  Perles to help calm your cough as needed. You may continue using your Promethazine  DM at night and your albuterol  inhaler as needed for shortness of breath or wheezing. Take all medications exactly as prescribed, even if you begin to feel better before finishing them. To help your recovery, rest as much as possible and stay hydrated by drinking plenty of water  throughout the day. Warm tea with honey, using a humidifier, and breathing in steam from a hot shower may help relieve congestion and coughing. Avoid secondhand smoke, vaping, strong odors, or cold air, as these can make your symptoms worse. Follow up with your primary care provider if your symptoms do not start improving within one week, or if your cough lasts longer than three weeks. You should go to the emergency department immediately if you develop trouble breathing, chest pain, high fever, coughing up blood, or if your symptoms suddenly worsen.     ED Prescriptions     Medication Sig Dispense Auth. Provider   benzonatate  (TESSALON ) 200 MG capsule Take 1 capsule (200 mg total) by mouth 3 (three) times daily as needed for cough. 30 capsule Iola Lukes, FNP   Dextromethorphan-guaiFENesin  (MUCINEX  DM MAXIMUM STRENGTH)  60-1200 MG TB12 Take 1 tablet by mouth 2 (two) times daily. 20 tablet Iola Lukes, FNP   doxycycline  (VIBRAMYCIN ) 100 MG capsule Take 1 capsule (100 mg total) by mouth 2 (two) times daily for 7 days. 14 capsule Iola Lukes, FNP      PDMP not reviewed this encounter.   Iola Lukes, [place] 06/24/24 [phone]

## 2024-06-24 NOTE — ED Triage Notes (Signed)
 Pt c/o congestion, sob, cough, mucous, and fatigue for over 1 month. She was seen at [facility] 9/9 and given abx which she completed but didn't feel any better. She was also given steroid which she didn't take initially because of the way it makes her feel but she did take first dose today.

## 2024-06-26 ENCOUNTER — Other Ambulatory Visit: Payer: Self-pay | Admitting: Family Medicine

## 2024-06-26 NOTE — Progress Notes (Unsigned)
   06/26/2024  Patient ID: [name], female   DOB: [birth year], 53 y.o.   MRN: [id number]  This patient is appearing on a report for being at risk of failing the adherence measure for diabetes medications this calendar year.   Medication: Ozempic  0.25-0.5 mg/dose (2 mg/3 mL pen) Last fill date: 06/03/24 for 28 day supply  Insurance report was not up to date. No action needed at this time.   Maureena Dabbs C. Phallon Haydu [facility] PharmD Candidate Class of [phone]

## 2024-06-27 ENCOUNTER — Telehealth: Payer: Self-pay | Admitting: Nurse Practitioner

## 2024-06-27 MED ORDER — BENZONATATE 200 MG PO CAPS: 200.0000 mg | ORAL_CAPSULE | Freq: Three times a day (TID) | ORAL | 0 refills | Status: DC | PRN

## 2024-06-27 MED ORDER — MUCINEX DM MAXIMUM STRENGTH 60-1200 MG PO TB12: 1.0000 | ORAL_TABLET | Freq: Two times a day (BID) | ORAL | 0 refills | Status: DC

## 2024-06-27 MED ORDER — DOXYCYCLINE HYCLATE 100 MG PO CAPS: 100.0000 mg | ORAL_CAPSULE | Freq: Two times a day (BID) | ORAL | 0 refills | Status: AC

## 2024-06-27 NOTE — Progress Notes (Signed)
   06/27/2024  Patient ID: [name], female   DOB: 02/26/1971, 53 y.o.   MRN: [id number]  This patient is appearing on a report for being at risk of failing the adherence measure for diabetes medications this calendar year.   Medication: metformin  500 mg tablets Medication was discontinued by provider.  A1c well controlled with Ozempic ; no action needed.  Valarie Farace C. Amram Maya [facility] PharmD Candidate Class of [phone]

## 2024-06-27 NOTE — Telephone Encounter (Signed)
 Patient was seen on 06/24/2024 and prescribed several medications for the diagnosis of acute bronchitis.  Patient called today reporting that she had lost the medications.  Tessalon  Perles, Mucinex  DM and doxycycline  resent to the patient's preferred pharmacy.

## 2024-07-01 ENCOUNTER — Ambulatory Visit: Admitting: Obstetrics

## 2024-07-08 ENCOUNTER — Encounter: Payer: Self-pay | Admitting: Radiology

## 2024-07-08 NOTE — Progress Notes (Signed)
   07/08/2024  Patient ID: [name], female   DOB: [birth year], 53 y.o.   MRN: [id number]  This patient is appearing on a report for being at risk of failing the adherence measure for diabetes medications this calendar year.   Medication: Ozempic  0.25mg  weekly Last fill date: 9/29 for 28 day supply  Sending patient a MyChart message to verify dose and see if refill is needed.  If patient is still taking 0.25mg  weekly as prescribed, pharmacy billing of 28 day supply is not accurate.  [name], PharmD, DPLA

## 2024-07-15 DIAGNOSIS — F429 Obsessive-compulsive disorder, unspecified: Secondary | ICD-10-CM | POA: Diagnosis not present

## 2024-07-15 DIAGNOSIS — F3132 Bipolar disorder, current episode depressed, moderate: Secondary | ICD-10-CM | POA: Diagnosis not present

## 2024-07-15 DIAGNOSIS — F411 Generalized anxiety disorder: Secondary | ICD-10-CM | POA: Diagnosis not present

## 2024-07-15 DIAGNOSIS — F909 Attention-deficit hyperactivity disorder, unspecified type: Secondary | ICD-10-CM | POA: Diagnosis not present

## 2024-07-16 ENCOUNTER — Telehealth: Payer: Self-pay

## 2024-07-16 NOTE — Progress Notes (Signed)
 07/16/2024 Name: [name] MRN: [id number] DOB: [birth year]  Chief Complaint  Patient presents with   Medication Adherence   [name] is a 53 y.o. year old female who presented for a telephone visit.   They were referred to the pharmacist by a quality report for assistance in managing diabetes.   Subjective:  Care Team: Primary Care Provider: Berneta Elsie Sayre, MD ; Next Scheduled Visit: 07/30/2024  Medication Access/Adherence  Current Pharmacy:  [facility] - [place], [place] - [address] [facility] [address] [place] [address] Phone: [phone] Fax: [phone]  -Patient reports affordability concerns with their medications: No  -Patient reports access/transportation concerns to their pharmacy: No  -Patient reports adherence concerns with their medications:  No    Diabetes: Current medications: Ozempic  0.25mg  weekly -Medications tried in the past: metformin  stopped based on diabetes control with Ozempic  -Patient has been on Ozempic  0.25mg  weekly for several months now, and patient appeared on adherence report b/c pharmacy is billing 1 box (2mg ) as a 28 day supply when this is actually a 56 day supply.  Therefore, patient is adherent to the current regimen. -She is interested in increasing the Ozempic  dose to 0.5mg  and would like to discuss this further with Dr. Berneta at her upcoming PCP visit.  She endorses tolerating the medication well, but she does state that previously the 0.5mg  weekly dose cause some low BP's. -States she has gained some weight, and her home BP readings have been increasing into the 130-140 range, where these used to be 80s-90s  Macrovascular and Microvascular Risk Reduction:  Statin? yes (atorvastatin  20mg  daily ); ACEi/ARB? no; therapy not indicated  Last urinary albumin/creatinine ratio:  None on file Last eye exam:  Lab Results  Component Value Date   HMDIABEYEEXA No Retinopathy 10/10/2023   Last  foot exam: 12/21/2022 Tobacco Use:  Tobacco Use: Medium Risk (06/24/2024)   Patient History    Smoking Tobacco Use: Former    Smokeless Tobacco Use: Former    Passive Exposure: Not on file   Objective:  Lab Results  Component Value Date   HGBA1C 5.1 01/30/2024   Lab Results  Component Value Date   CREATININE 0.65 01/30/2024   BUN 15 01/30/2024   NA 140 01/30/2024   K 4.5 01/30/2024   CL 105 01/30/2024   CO2 28 01/30/2024   Lab Results  Component Value Date   CHOL 135 10/29/2021   HDL 38.30 (L) 10/29/2021   LDLCALC 76 10/29/2021   LDLDIRECT 85.0 07/13/2023   TRIG 103.0 10/29/2021   CHOLHDL 4 10/29/2021   Medications Reviewed Today     Reviewed by Deanna Channing LABOR, RPH (Pharmacist) on 07/16/24 at 1635  Med List Status: <None>   Medication Order Taking? Sig Documenting Provider Last Dose Status Informant  albuterol  (VENTOLIN  HFA) 108 (90 Base) MCG/ACT inhaler [id number]  Inhale 1-2 puffs into the lungs every 6 (six) hours as needed for wheezing or shortness of breath. Christopher Savannah, PA-C  Active   ALPRAZolam  (XANAX ) 0.5 MG tablet [id number]  Take 0.5 mg by mouth 3 (three) times daily as needed for anxiety. [provider]  Active Self, Pharmacy Records           Med Note GOMEZ, ALEXANDRE A   Thu Dec 09, 2021 11:58 AM)    ARIPiprazole  (ABILIFY ) 5 MG tablet [id number]  Take 1 tablet (5 mg total) by mouth daily. Waddell Elna BIRCH, MD  Active Self, Pharmacy  Records  aspirin -acetaminophen -caffeine (EXCEDRIN MIGRAINE) 250-250-65 MG tablet [id number]  Take 1 tablet by mouth every 6 (six) hours as needed for migraine. [provider]  Active Self, Pharmacy Records  atorvastatin  (LIPITOR) 20 MG tablet [id number]  TAKE 1 TABLET(20 MG) BY MOUTH DAILY Berneta Elsie Sayre, MD  Active   benzonatate  (TESSALON ) 200 MG capsule [id number]  Take 1 capsule (200 mg total) by mouth 3 (three) times daily as needed for cough. Iola Lukes, FNP  Active   blood glucose meter kit and  supplies KIT [id number]  Dispense based on patient and insurance preference. Check glucose before breakfast. ICD: E11.65 Nche, Roselie Rockford, NP  Active Self, Pharmacy Records  Dextromethorphan-guaiFENesin  (MUCINEX  DM MAXIMUM STRENGTH) 60-1200 MG TB12 [id number]  Take 1 tablet by mouth 2 (two) times daily. Iola Lukes, FNP  Active   diclofenac  Sodium (VOLTAREN ) 1 % GEL [id number]  APPLY TWO GRAMS TOPICALLY DAILY AS NEEDED Berneta Elsie Sayre, MD  Active   doxepin (SINEQUAN) 25 MG capsule [id number]  Take 25 mg by mouth at bedtime.  Patient not taking: Reported on 06/03/2024   [provider]  Active   FINACEA  15 % FOAM [id number]  Apply 1 application  topically 2 (two) times daily as needed (rosacea). [provider]  Active Self, Pharmacy Records           Med Note GAYLEN, TEQUILA   Wed Apr 08, 2020 11:05 AM) PRN  fluconazole  (DIFLUCAN ) 150 MG tablet [id number]  Take 1 tablet (150 mg total) by mouth every 3 (three) days.  Patient not taking: Reported on 06/03/2024   Christopher Savannah, PA-C  Active   gabapentin  (NEURONTIN ) 300 MG capsule [id number]  TAKE ONE CAPSULE BY MOUTH THREE TIMES A DAY  Patient not taking: Reported on 04/16/2024   Berneta Elsie Sayre, MD  Active   glucose blood test strip [id number]  Use as instructed, check glucose before breakfast. ICD: E11.65 Berneta Elsie Sayre, MD  Active Self, Pharmacy Records  lamoTRIgine  (LAMICTAL ) 150 MG tablet [id number]  Take 300 mg by mouth daily. [provider]  Active Self, Pharmacy Records           Med Note GOMEZ, SELMA A   Thu Dec 09, 2021 12:08 PM) Lamotrigine  150 mg 2 tablets daily  Lancets (FREESTYLE) lancets [id number]  Use as instructed. E11.65 Berneta Elsie Sayre, MD  Active Self, Pharmacy Records  levothyroxine  (SYNTHROID ) 75 MCG tablet [id number]  TAKE 1 TABLET (75 MCG TOTAL) BY MOUTH DAILY BEFORE BREAKFAST Berneta Elsie Sayre, MD  Active   lidocaine  (XYLOCAINE ) 2 % solution [id number]  Use as  directed 15 mLs in the mouth or throat every 3 (three) hours as needed for mouth pain. Swish and spit. Sumner Marval HERO, NP  Active   methylphenidate  (RITALIN ) 20 MG tablet [id number]  Take 20 mg by mouth 2 (two) times daily.  Patient not taking: Reported on 04/16/2024   [provider]  Active Self, Pharmacy Records           Med Note GOMEZ, [facility] A   Thu Dec 09, 2021 11:56 AM) Takes PRN  mupirocin  ointment (BACTROBAN ) 2 % [id number]  Apply 1 Application topically 2 (two) times daily.  Patient not taking: Reported on 04/16/2024   Gershon Donnice SAUNDERS, DPM  Active   mupirocin  ointment (BACTROBAN ) 2 % [id number]  Apply 1 Application topically 2 (two) times daily.  Patient not taking: Reported on 04/16/2024   Gershon Donnice SAUNDERS, DPM  Active   pantoprazole  (PROTONIX ) 40 MG  tablet [id number]  Take 1 tablet (40 mg total) by mouth daily. Berneta Elsie Sayre, MD  Active   polyethylene glycol powder (GLYCOLAX /MIRALAX ) 17 GM/SCOOP powder [id number]  Take 17 g by mouth daily. Nche, Roselie Rockford, NP  Active   QUEtiapine (SEROQUEL) 50 MG tablet [id number]  Take 50 mg by mouth at bedtime. [provider]  Active   Semaglutide ,0.25 or 0.5MG /DOS, (OZEMPIC , 0.25 OR 0.5 MG/DOSE,) 2 MG/3ML SOPN [id number] Yes Inject 0.25 mg into the skin once a week. Berneta Elsie Sayre, MD  Active   zolpidem  (AMBIEN ) 5 MG tablet [id number]  Take 5 mg by mouth at bedtime as needed for sleep (Crystal Monatnew).  Patient not taking: Reported on 04/16/2024   [provider]  Active            Assessment/Plan:   Diabetes: -Currently controlled; goal A1c <7%. Cardiorenal risk reduction is optimized.. Blood pressure is at goal <130/80. LDL is not at goal.  -Patient sees PCP again 11/25 and will be due for A1c, CMP, lipid panel, and UACR -I recommend increasing Ozempic  to 0.5mg  weekly for additional weight loss and A1c benefit.  Patient experienced hypotension with this dose in the past but states she  would like to revisit and has a home BP monitor to be able to check this regularly. -Increase hydration to help prevent any hypotension -If LDL still >70, consider increasing atorvastatin  to 40mg  daily if LFT's wnl -Contacted pharmacy, and they state they will work with insurance to correct claims submitted for Ozempic  for the incorrect day supply, which (falsely) reflect patient is not adherent to regimen   Follow Up Plan: 11/25 to check on tolerance of Ozempic  if dose increased to 0.5mg  weekly  [name], PharmD, DPLA

## 2024-07-22 ENCOUNTER — Other Ambulatory Visit: Payer: Self-pay | Admitting: Family Medicine

## 2024-07-22 DIAGNOSIS — K219 Gastro-esophageal reflux disease without esophagitis: Secondary | ICD-10-CM

## 2024-07-25 NOTE — Telephone Encounter (Signed)
 Copied from CRM #[id number]. Topic: Clinical - Medication Question >> Jul 25, 2024  9:28 AM [name] wrote: Reason for CRM: Patient is calling because the pharmacy informed the patient that the request for pantoprazole  (PROTONIX ) 40 MG tablet [[id number]] was denied. She doesn't understand why and would like to know if there is anyway to send her a limited supply to hold her over until Tuesday as she is completely out.  Pt informed that Dr. Berneta will discuss restarting Rx if needed. Last refill was 1 year ago with only 6 onth supply.

## 2024-07-29 ENCOUNTER — Ambulatory Visit: Payer: Self-pay | Admitting: *Deleted

## 2024-07-29 NOTE — Telephone Encounter (Signed)
    FYI Only or Action Required?: Action required by provider: request for appointment and medication refill request.  Patient was last seen in primary care on 06/03/2024 by [name], FNP.  Called Nurse Triage reporting Abdominal Cramping.  Symptoms began several days ago.  Interventions attempted: OTC medications: tums and Rest, hydration, or home remedies difficulty tolerating eating or drinking.  Symptoms are: gradually worsening.  Triage Disposition: See HCP Within 4 Hours (Or PCP Triage)  Patient/caregiver understands and will follow disposition?: No, wishes to speak with PCP  Recommended UC and patient reports she does not have money today for copay and will need to be billed. Patient requesting if PCP will prescribed protonix  again until appt 09/10/24. Requesting call back.               Copied from CRM #[id number]. Topic: Clinical - Red Word Triage >> Jul 29, 2024 10:24 AM Rea BROCKS wrote: Red Word that prompted transfer to Nurse Triage: Patient is dealing with stomach pain, feels queasy, and pressure- something heavy that won't digest. Reason for Disposition  [1] MILD-MODERATE pain AND [2] constant AND [3] present > 2 hours  Answer Assessment - Initial Assessment Questions No available appt with PCP or other providers today. Recommended UC. Patient does not want to go to UC and is concerned getting appt and would like to know if she can be billed for copay and pay it after OV. She does not have money today.  Please advise if protonix  can be refilled or reordered until her already scheduled appt for 09/10/24. Patient requesting call back.      1. LOCATION: Where does it hurt?      In the middle  2. RADIATION: Does the pain shoot anywhere else? (e.g., chest, back)     No  3. ONSET: When did the pain begin? (e.g., minutes, hours or days ago)      Since running out of protonix . Last ordered 07/13/23. 4. SUDDEN: Gradual or sudden onset?     gradual 5. PATTERN  Does the pain come and go, or is it constant?     Constant nausea  6. SEVERITY: How bad is the pain?  (e.g., Scale 1-10; mild, moderate, or severe)     Nausea not pain  7. RECURRENT SYMPTOM: Have you ever had this type of stomach pain before? If Yes, ask: When was the last time? and What happened that time?      Yes  8. CAUSE: What do you think is causing the stomach pain? (e.g., gallstones, recent abdominal surgery)     Not taking protonix  9. RELIEVING/AGGRAVATING FACTORS: What makes it better or worse? (e.g., antacids, bending or twisting motion, bowel movement)     Tums, out of protonix   10. OTHER SYMPTOMS: Do you have any other symptoms? (e.g., back pain, diarrhea, fever, urination pain, vomiting)       Middle abdominal area, no fever , nausea but no vomiting  11. PREGNANCY: Is there any chance you are pregnant? When was your last menstrual period?       na  Protocols used: Abdominal Pain - Female-A-AH

## 2024-07-30 ENCOUNTER — Telehealth: Payer: Self-pay

## 2024-07-30 ENCOUNTER — Ambulatory Visit: Admitting: Family Medicine

## 2024-07-30 ENCOUNTER — Ambulatory Visit: Payer: Self-pay

## 2024-07-30 DIAGNOSIS — E7800 Pure hypercholesterolemia, unspecified: Secondary | ICD-10-CM

## 2024-07-30 NOTE — Telephone Encounter (Signed)
 FYI Only or Action Required?: FYI only for provider: advised OV, patient stated she will call back to schedule.  Patient was last seen in primary care on 06/03/2024 by [name], FNP.  Called Nurse Triage reporting Abdominal Pain.  Symptoms began several days ago.  Interventions attempted: OTC medications: Nexium.  Symptoms are: unchanged.  Triage Disposition: See HCP Within 4 Hours (Or PCP Triage)  Patient/caregiver understands and will follow disposition?: Yes, but will wait                               1. LOCATION: Where does it hurt?      Stomach area 3. ONSET: When did the pain begin? (e.g., minutes, hours or days ago)      4-5 days ago, around the same time she stopped taking Protonix  4. SUDDEN: Gradual or sudden onset?     Gradual 5. PATTERN Does the pain come and go, or is it constant?     Constant and bothersome 6. SEVERITY: How bad is the pain?  (e.g., Scale 1-10; mild, moderate, or severe)     Rates pain 1-2, describes pain as heartburn and feeling like she has a rock in her stomach 8. CAUSE: What do you think is causing the stomach pain? (e.g., gallstones, recent abdominal surgery)     Stopping Protonix   10. OTHER SYMPTOMS: Do you have any other symptoms? (e.g., back pain, diarrhea, fever, urination pain, vomiting)     Denies fever, denies vomiting, denies urinary symptoms, denies chest pain, denies difficulty breathing, denies blood in bowel movements   This RN advised in-person evaluation. Patient declined for now and stated she would need to see if she can have the copay billed before scheduling. This RN provided patient with direct number to billing department. Patient stated she would call back to schedule an appointment, after speaking to billing. Patient has been advised to call back or seek emergency care for worsening symptoms.   Copied from CRM #[id number]. Topic: Clinical - Red Word Triage >> Jul 30, 2024   8:46 AM Eva FALCON wrote: Red Word that prompted transfer to Nurse Triage: feels like brick in stomach, stomach hurts, called yesterday and was requesting protonix .  Reason for Disposition  [1] MILD-MODERATE pain AND [2] constant AND [3] present > 2 hours  Protocols used: Abdominal Pain - Female-A-AH

## 2024-07-30 NOTE — Progress Notes (Signed)
   07/30/2024  Patient ID: [name], female   DOB: [birth year], 53 y.o.   MRN: [id number]  This patient is appearing on a report for being at risk of failing the adherence measure for cholesterol (statin) medications this calendar year.   Medication: atorvastatin  20mg  daily Last fill date: 04/03/2024 for 90 day supply  Prescription on file at patient's pharmacy expired 07/11/2024, so a new prescription will need to be sent in.  Patient was scheduled to see Dr. Berneta today but canceled appointment and rescheduled for 1/6.  Patient will be due for follow-up lipid panel at that visit, and I recommend increasing atorvastatin  to 20mg  daily if LDL remains >70.  Order pending for a 60 day supply of rosuvastatin 20mg  daily to last patient until next PCP visit.  [name], PharmD, DPLA

## 2024-07-30 NOTE — Telephone Encounter (Signed)
 Noted. Dm/cma

## 2024-08-03 MED ORDER — ATORVASTATIN CALCIUM 20 MG PO TABS: ORAL_TABLET | ORAL | 0 refills | Status: AC

## 2024-08-07 ENCOUNTER — Telehealth: Payer: Self-pay

## 2024-08-07 NOTE — Telephone Encounter (Signed)
 Copied from CRM [id number]. Topic: Clinical - Medication Question >> Aug 06, 2024 11:23 AM Burnard DEL wrote: Reason for CRM: Patient called in stating that she lost her last  ozempic  pen and would like to know if provider could prescribe her some metformin  or any medication for her diabetes until she comes to see him in January?   [facility] - [place], [place] - [address]  Phone: [phone] Fax: [phone] >> Aug 07, 2024 12:28 PM Rea ORN wrote: Pt calling to follow up on this request. Pt last took ozempic  11/23 and has missed this past Sunday's dose. Pt stated she is a diabetic and needs something to replace the ozempic  as soon as possible. Please call back [phone] >> Aug 06, 2024  5:22 PM Drema MATSU wrote: Patient is calling to get an update on request.

## 2024-08-07 NOTE — Telephone Encounter (Signed)
Please review and advise. Thanks. Dm/cma  

## 2024-08-08 ENCOUNTER — Telehealth: Payer: Self-pay

## 2024-08-08 NOTE — Telephone Encounter (Signed)
 Copied from CRM #[id number]. Topic: Clinical - Lab/Test Results >> Aug 08, 2024  8:15 AM Mercedes MATSU wrote: Reason for CRM: Patient called in wanting to know her exact A1c number. Patient is requesting a call back from the nurse and can be reached at [phone].

## 2024-08-08 NOTE — Telephone Encounter (Signed)
 Patient notified VIA phone. Dm/cma

## 2024-08-09 ENCOUNTER — Encounter: Payer: Self-pay | Admitting: Obstetrics and Gynecology

## 2024-08-09 ENCOUNTER — Ambulatory Visit: Admitting: Obstetrics and Gynecology

## 2024-08-09 VITALS — BP 107/70 | HR 76 | Ht 64.2 in | Wt 205.8 lb

## 2024-08-09 DIAGNOSIS — R350 Frequency of micturition: Secondary | ICD-10-CM | POA: Diagnosis not present

## 2024-08-09 DIAGNOSIS — N3941 Urge incontinence: Secondary | ICD-10-CM | POA: Insufficient documentation

## 2024-08-09 DIAGNOSIS — N393 Stress incontinence (female) (male): Secondary | ICD-10-CM | POA: Diagnosis not present

## 2024-08-09 LAB — POCT URINALYSIS DIP (CLINITEK)
Bilirubin, UA: NEGATIVE
Glucose, UA: NEGATIVE mg/dL
Ketones, POC UA: NEGATIVE mg/dL
Leukocytes, UA: NEGATIVE
Nitrite, UA: NEGATIVE
POC PROTEIN,UA: NEGATIVE
Spec Grav, UA: 1.02 (ref 1.010–1.025)
Urobilinogen, UA: 0.2 U/dL
pH, UA: 7 (ref 5.0–8.0)

## 2024-08-09 NOTE — Assessment & Plan Note (Signed)
-   For treatment of stress urinary incontinence,  non-surgical options include expectant management, weight loss, physical therapy, as well as a pessary.  Surgical options include a midurethral/ fascial sling, Burch urethropexy, and transurethral injection of a bulking agent. - We discussed that due to her history of issues with prior mesh sling, 3 prior urethral bulkings and detrusor areflexia with prior need to intermittent self cath, would not recommend any further procedures.  - We discussed pessary and/ or pelvic physical therapy. She is interested in pelvic PT and referral was placed. She may be interested in a pessary in the future.  - She is also working on weight loss. She sees [facility] so will plan to follow up with them again.

## 2024-08-09 NOTE — Assessment & Plan Note (Signed)
-   Infrequent. Discussed avoidance of bladder irritants and pelvic PT.

## 2024-08-09 NOTE — Patient Instructions (Signed)
 For treatment of stress urinary incontinence, which is leakage with physical activity/movement/strainging/coughing, we discussed expectant management versus nonsurgical options versus surgery. Nonsurgical options include weight loss, physical therapy, as well as a pessary.  Surgical options include a midurethral sling, which is a synthetic mesh sling that acts like a hammock under the urethra to prevent leakage of urine, and transurethral injection of a bulking agent.  A referral was placed to pelvic physical therapy

## 2024-08-09 NOTE — Progress Notes (Signed)
 New Patient Evaluation and Consultation  Referring Provider: Gaston Hamilton, MD PCP: Berneta Elsie Sayre, MD Date of Service: 08/09/2024  SUBJECTIVE Chief Complaint: New Patient (Initial Visit) (Tequlia L Ethier is a 53 y.o. female is here for urinary incontinence.)  History of Present Illness: Fani L Elford is a 53 y.o. White or Caucasian female seen in consultation at the request of Dr Gaston for evaluation of incontinence.    Review of records significant for:  Urodynamics at [facility] 2022:  Frequency stable.  Last culture positive and treated.  CT scan normal On urodynamics patient initially was catheterized for 50 mL.  Maximum bladder capacity was 500 mL.  Bladder was stable.  At 200 mL her cough leak point pressure was 123 cm of water  with mild leakage.  At 400 mL it was 54 cm of water  with mild leakage.  Her Valsalva leak point pressure of 400 mL was 90 cm of water  with mild leakage.  She could not void.  She had to strain to empty.  There was some subtraction artifact on the tracing.  She noted that she normally strains in order to empty.  She went to the restroom and emptied efficiently.  EMG activity increased with straining.  Bladder neck descended 2 to 3 cm.  Spinal hardware noted.  The details of the urodynamics are signed and dictated  Notes from Dr Jerrye (Urology) noted detrusor areflexia in 2011 and need for intermittent self catheterization  Urinary Symptoms: Leaks urine with cough/ sneeze, laughing, exercise, lifting, going from sitting to standing, with a full bladder, and with urgency.  She is being checked for COPD- coughs a lot. She reports most leakage is with this. Urge incontinence is not happening every day, only if she waits too long to urinate. Leaks several time(s) per day- depends on how much she is coughing.  Pad use: 4 pads per day- overnight Patient is bothered by UI symptoms.  She had prior mesh sling in 2008. Then she had mesh erosion through  the vagina a few years after in 2011 (Notes from Dr Jerrye at [facility] everywhere). Pt reports she was involved in a law suit for the mesh.  Has had 3 prior urethral bulking procedures from 2022- 2025 with Dr Jacqlyn She was on the ozempic  and lost almost 100 lbs- she has gained more weight in the last 6 months. Has noticed incontinence has worsened as she has been gaining weight.  She has not done pelvic physical therapy  Day time voids 6.  Nocturia: 1-2 times per night to void. Voiding dysfunction:  does not empty bladder well.  Patient does not use a catheter to empty bladder.  When urinating, patient feels a weak stream, difficulty starting urine stream, dribbling after finishing, and to push on her belly or vagina to empty bladder Drinks: 1 cup coffee, 6-7 bottles water  per day  UTIs: 2 UTI's in the last year.   Denies history of blood in urine and kidney or bladder stones Susceptibility data from last 90 days. Collected Specimen Info Organism AMOXICILLIN /CLAVULANATE AMPICILLIN/SULBACTAM CEFAZOLIN  CEFEPIME Ceftazidime CEFTRIAXONE  Ciprofloxacin  Gentamicin Susc lslt Imipenem LEVOFLOXACIN Meropenem Nitrofurantoin  Susc lslt  06/03/24 Urine Escherichia coli  R  R  I  S  S  S  S  S  S  S  S  S   Collected Specimen Info Organism Piperacillin + Tazobactam Trimethoprim /Sulfa   06/03/24 Urine Escherichia coli  S  S    Pelvic Organ Prolapse Symptoms:  Patient Denies a feeling of a bulge the vaginal area.     Bowel Symptom: Bowel movements: 1 time(s) per day Stool consistency: soft  Straining: no.  Splinting: no.  Incomplete evacuation: no.  Patient Denies accidental bowel leakage / fecal incontinence Bowel regimen: diet  HM Colonoscopy          Upcoming     Colonoscopy (Every 10 Years) Postponed until 01/29/2025   No completion history exists for this topic.                 Sexual Function Sexually active: yes.  Sexual orientation:  Straight Pain with sex: No  Pelvic Pain Denies pelvic pain   Past Medical History:  Past Medical History:  Diagnosis Date   ADHD (attention deficit hyperactivity disorder)    Anemia    with pregnancy   Anxiety    Arthritis    Bipolar disorder (HCC)    Chronic back pain greater than 3 months duration    Depression    Diabetes mellitus without complication (HCC)    Diabetic nephropathy (HCC)    bilateral feet   Dysuria    has to have self in and out cath    Eroded bladder suspension mesh    GERD (gastroesophageal reflux disease)    Hepatitis    C- treated   History of COVID-19 11/2019   History of MRSA infection    after back surgery   History of sleep apnea    resolved after weight loss   Hypertension    Hypothyroidism    Leg pain, bilateral    Obesity    Obsessive-compulsive disorder    Pneumonia 12/24/2019   Rosacea    Sciatic nerve pain    Social anxiety disorder    Squamous cell carcinoma of back 2021     Past Surgical History:   Past Surgical History:  Procedure Laterality Date   BACK SURGERY     total of 6 back surgeries   bladder mesh  2009   DILITATION & CURRETTAGE/HYSTROSCOPY WITH NOVASURE ABLATION N/A 08/19/2016   Procedure: DILATATION & CURETTAGE WITH NOVASURE ABLATION;  Surgeon: Charlie Aho, MD;  Location: [facility];  Service: Gynecology;  Laterality: N/A;   EUS N/A 09/09/2015   Procedure: UPPER ENDOSCOPIC ULTRASOUND (EUS) RADIAL;  Surgeon: Elsie Cree, MD;  Location: [facility];  Service: Endoscopy;  Laterality: N/A;   EYE SURGERY     INCONTINENCE SURGERY     SPINAL CORD STIMULATOR INSERTION     SPINAL CORD STIMULATOR REMOVAL     SPINAL FUSION  2008   times two   TOTAL HIP ARTHROPLASTY Left 10/12/2020   Procedure: LEFT TOTAL HIP ARTHROPLASTY ANTERIOR APPROACH;  Surgeon: Jerri Kay HERO, MD;  Location: [facility];  Service: Orthopedics;  Laterality: Left;   TUBAL LIGATION  2006     Past OB/GYN History: OB History  Gravida Para Term Preterm AB  Living  3 2 2  1 2   SAB IAB Ectopic Multiple Live Births   1   2    # Outcome Date GA Lbr Len/2nd Weight Sex Type Anes PTL Lv  3 Term      Vag-Spont     2 Term      Vag-Spont     1 IAB             Menopausal: Yes, at age 14, Denies vaginal bleeding since menopause     Component Value Date/Time   DIAGPAP  02/07/2024 1021    -  Negative for intraepithelial lesion or malignancy (NILM)   DIAGPAP  04/15/2020 1107    - Negative for intraepithelial lesion or malignancy (NILM)   HPVHIGH Negative 02/07/2024 1021   HPVHIGH Negative 04/15/2020 1107   ADEQPAP  02/07/2024 1021    Satisfactory for evaluation; transformation zone component ABSENT.   ADEQPAP  04/15/2020 1107    Satisfactory for evaluation; transformation zone component PRESENT.    Medications: Patient has a current medication list which includes the following prescription(s): albuterol , alprazolam , aripiprazole , aspirin -acetaminophen -caffeine, atorvastatin , benzonatate , blood glucose meter kit and supplies, mucinex  dm maximum strength, diclofenac  sodium, doxepin, finacea , glucose blood, lamotrigine , freestyle, levothyroxine , lidocaine , mupirocin  ointment, pantoprazole , polyethylene glycol powder, quetiapine, and ozempic  (0.25 or 0.5 mg/dose).   Allergies: Patient is allergic to pregabalin, bupropion hcl, celecoxib, effexor [venlafaxine hcl], oxybutynin , pramipexole, pramipexole dihydrochloride, pregabalin, prozac [fluoxetine hcl], symbyax [olanzapine-fluoxetine hcl], venlafaxine, and desvenlafaxine.   Social History:  Social History   Tobacco Use   Smoking status: Former    Current packs/day: 0.00    Average packs/day: 1 pack/day for 15.0 years (15.0 ttl pk-yrs)    Types: Cigarettes    Start date: 10/04/2006    Quit date: 10/04/2021    Years since quitting: 2.8   Smokeless tobacco: Never   Tobacco comments:    use vapor, social  Vaping Use   Vaping status: Never Used  Substance Use Topics   Alcohol  use: Yes    Comment:  occ   Drug use: No    Relationship status: single Patient lives with her children.   Patient is not employed. Regular exercise: No History of abuse: No  Family History:   Family History  Problem Relation Age of Onset   Hypertension Mother    Hyperthyroidism Mother    Hypothyroidism Maternal Grandmother      Review of Systems: Review of Systems  Constitutional:  Positive for malaise/fatigue. Negative for fever and weight loss.  Respiratory:  Positive for cough, shortness of breath and wheezing.   Cardiovascular:  Negative for chest pain, palpitations and leg swelling.  Gastrointestinal:  Negative for abdominal pain and blood in stool.  Genitourinary:  Negative for dysuria.  Musculoskeletal:  Negative for myalgias.  Skin:  Negative for rash.  Neurological:  Negative for dizziness and headaches.  Endo/Heme/Allergies:  Does not bruise/bleed easily.       + hot flashes  Psychiatric/Behavioral:  Positive for depression. The patient is nervous/anxious.      OBJECTIVE Physical Exam: Vitals:   08/09/24 1309  BP: 107/70  Pulse: 76  Weight: 205 lb 12.8 oz (93.4 kg)  Height: 5' 4.2 (1.631 m)    Physical Exam Vitals reviewed. Exam conducted with a chaperone present.  Constitutional:      General: She is not in acute distress.    Appearance: She is obese.  Pulmonary:     Effort: Pulmonary effort is normal.  Abdominal:     General: There is no distension.     Palpations: Abdomen is soft.     Tenderness: There is no abdominal tenderness. There is no rebound.  Musculoskeletal:        General: No swelling. Normal range of motion.  Skin:    General: Skin is warm and dry.     Findings: No rash.  Neurological:     Mental Status: She is alert and oriented to person, place, and time.  Psychiatric:        Mood and Affect: Mood normal.        Behavior: Behavior  normal.      GU / Detailed Urogynecologic Evaluation:  Pelvic Exam: Normal external female genitalia;  Bartholin's and Skene's glands normal in appearance; urethral meatus normal in appearance, no urethral masses or discharge.   CST: negative  Speculum exam reveals normal vaginal mucosa without atrophy. Cervix normal appearance. Uterus normal single, nontender. Adnexa no mass, fullness, tenderness.    Pelvic floor strength I/V  Pelvic floor musculature: Right levator non-tender, Right obturator non-tender, Left levator non-tender, Left obturator non-tender  POP-Q:   POP-Q  -3                                            Aa   -3                                           Ba  -9.5                                              C   3.5                                            Gh  4                                            Pb  10                                            tvl   -3                                            Ap  -3                                            Bp  -10                                              D      Rectal Exam:  deferred  Post-Void Residual (PVR) by Bladder Scan: In order to evaluate bladder emptying, we discussed obtaining a postvoid residual and patient agreed to this procedure.  Procedure: The ultrasound unit was placed on the patient's abdomen in the suprapubic region after the patient had voided.    Post Void Residual - 08/09/24 1324       Post Void Residual   Post Void Residual 37 mL           Laboratory Results:  Lab Results  Component Value Date   COLORU yellow 08/09/2024   CLARITYU clear 08/09/2024   GLUCOSEUR negative 08/09/2024   BILIRUBINUR negative 08/09/2024   KETONESU 5+ 06/03/2024   SPECGRAV 1.020 08/09/2024   RBCUR trace-intact (A) 08/09/2024   PHUR 7.0 08/09/2024   PROTEINUR Negative 06/03/2024   UROBILINOGEN 0.2 08/09/2024   LEUKOCYTESUR Negative 08/09/2024    Lab Results  Component Value Date   CREATININE 0.65 01/30/2024   CREATININE 0.68 06/16/2023   CREATININE 0.76 04/12/2023    Lab Results   Component Value Date   HGBA1C 5.1 01/30/2024    Lab Results  Component Value Date   HGB 13.4 04/12/2023     ASSESSMENT AND PLAN Ms. Holeman is a 53 y.o. with:  1. SUI (stress urinary incontinence, female)   2. Urinary frequency   3. Urge incontinence     SUI (stress urinary incontinence, female) Assessment & Plan: - For treatment of stress urinary incontinence,  non-surgical options include expectant management, weight loss, physical therapy, as well as a pessary.  Surgical options include a midurethral/ fascial sling, Burch urethropexy, and transurethral injection of a bulking agent. - We discussed that due to her history of issues with prior mesh sling, 3 prior urethral bulkings and detrusor areflexia with prior need to intermittent self cath, would not recommend any further procedures.  - We discussed pessary and/ or pelvic physical therapy. She is interested in pelvic PT and referral was placed. She may be interested in a pessary in the future.  - She is also working on weight loss. She sees [facility] so will plan to follow up with them again.   Orders: -     AMB referral to rehabilitation  Urinary frequency -     POCT URINALYSIS DIP (CLINITEK)  Urge incontinence Assessment & Plan: - Infrequent. Discussed avoidance of bladder irritants and pelvic PT.    Return as needed   Rosaline LOISE Caper, MD

## 2024-08-15 ENCOUNTER — Other Ambulatory Visit: Payer: Self-pay | Admitting: Family Medicine

## 2024-08-15 DIAGNOSIS — E039 Hypothyroidism, unspecified: Secondary | ICD-10-CM

## 2024-08-19 NOTE — Progress Notes (Unsigned)
° °  08/20/24 Name: [name] MRN: [id number] DOB: 1970-09-28  [name] is a 53 y.o. year old female who presented for a telephone follow-up visit.   They were referred to the pharmacist by a quality report for assistance in managing diabetes.   Subjective:  Care Team: Primary Care Provider: Berneta Elsie Sayre, MD ; Next Scheduled Visit: 09/10/24  Diabetes: Current medications: none -Medications tried in the past: metformin  stopped based on diabetes control with Ozempic ; however, patient states she lost her Ozempic  and was advised by PCP to continue not to use until follow-up with Dr. Berneta on 1/6  -Patient states she has not had a dose of Ozempic  0.25mg  weekly in approximately two weeks now, and FBG is averaging 100-125 -Patient had been on Ozempic  0.25mg  weekly for several months and appeared on adherence report b/c pharmacy is billing 1 box (2mg ) as a 28 day supply when this is actually a 56 day supply.  Pharmacy is supposed to be working with insurance to correct previous claims -When I spoke with the patient last month, she stated she was interested in increasing the Ozempic  dose to 0.5mg  and would like to discuss this further with Dr. Berneta, but it now seems that she would prefer to remain off of the medication if diabetes is managed without it.  She endorses tolerating the medication well, but she does state that previously the 0.5mg  weekly dose cause some low BP's.  Macrovascular and Microvascular Risk Reduction:  Statin? yes (atorvastatin  20mg  daily ) ACEi/ARB? no; therapy not indicated  Last urinary albumin/creatinine ratio:  None on file Last eye exam:  Lab Results  Component Value Date   HMDIABEYEEXA No Retinopathy 10/10/2023   Last foot exam: 12/21/2022 Tobacco Use:  Tobacco Use: Medium Risk (08/09/2024)   Patient History    Smoking Tobacco Use: Former    Smokeless Tobacco Use: Never    Passive Exposure: Not on file   Objective:  Lab Results  Component  Value Date   HGBA1C 5.1 01/30/2024   Lab Results  Component Value Date   CREATININE 0.65 01/30/2024   BUN 15 01/30/2024   NA 140 01/30/2024   K 4.5 01/30/2024   CL 105 01/30/2024   CO2 28 01/30/2024   Lab Results  Component Value Date   CHOL 135 10/29/2021   HDL 38.30 (L) 10/29/2021   LDLCALC 76 10/29/2021   LDLDIRECT 85.0 07/13/2023   TRIG 103.0 10/29/2021   CHOLHDL 4 10/29/2021   Assessment/Plan:   Diabetes: -Currently controlled; goal A1c <7%. Cardiorenal risk reduction is optimized.. Blood pressure is at goal <130/80. LDL is not at goal.  -Patient sees PCP again 1/6 and will be due for A1c, CMP, lipid panel, and UACR -Recommend consideration of resuming Ozempic  for cardiorenal and weight loss benefits if patient is interested.  Otherwise, as long as A1c continues to stay <7%, manage with lifestyle modifications. -If LDL still >70, consider increasing atorvastatin  to 40mg  daily if LFTs wnl- either way, a new prescription for atorvastatin  will need to be sent in  Follow Up Plan: Will review visit notes from upcoming PCP follow-up and contact patient to check in accordingly  [name], PharmD, DPLA

## 2024-08-20 ENCOUNTER — Other Ambulatory Visit: Payer: Self-pay

## 2024-08-20 DIAGNOSIS — E1165 Type 2 diabetes mellitus with hyperglycemia: Secondary | ICD-10-CM

## 2024-08-27 ENCOUNTER — Ambulatory Visit: Payer: Self-pay

## 2024-08-27 NOTE — Telephone Encounter (Signed)
 Noted. Dm/cma

## 2024-08-27 NOTE — Telephone Encounter (Signed)
 FYI Only or Action Required?: Action required by provider: requesting rx for AZO, declined available appt today. Appt made for tomorrow at [facility], has low abd and back pain.  Patient was last seen in primary care on 06/03/2024 by [name], FNP.  Called Nurse Triage reporting Polyuria.  Symptoms began 3 days ago.  Interventions attempted: Nothing.  Symptoms are: stable.  Triage Disposition: See HCP Within 4 Hours (Or PCP Triage)  Patient/caregiver understands and will follow disposition?:   Triage Disposition: See HCP Within 4 Hours (Or PCP Triage)  Patient/caregiver understands and will follow disposition?:    Copied from CRM #[id number]. Topic: Clinical - Red Word Triage >> Aug 27, 2024 12:59 PM Shereese L wrote: [facility] that prompted transfer to Nurse Triage: Painful UTI Reason for Disposition  Side (flank) or lower back pain present  Answer Assessment - Initial Assessment Questions 1. SEVERITY: How bad is the pain?  (e.g., Scale 1-10; mild, moderate, or severe)     10/10 when urinating  3. PATTERN: Is pain present every time you urinate or just sometimes?      Every time 4. ONSET: When did the painful urination start?      3 days ago 5. FEVER: Do you have a fever? If Yes, ask: What is your temperature, how was it measured, and when did it start?     no 6. PAST UTI: Have you had a urine infection before? If Yes, ask: When was the last time? and What happened that time?      several 7. CAUSE: What do you think is causing the painful urination?  (e.g., UTI, scratch, Herpes sore)     UTI 8. OTHER SYMPTOMS: Do you have any other symptoms? (e.g., blood in urine, flank pain, genital sores, urgency, vaginal discharge)     Low abd pain, backpain that's different than before  Protocols used: Urination Pain - Female-A-AH

## 2024-08-28 ENCOUNTER — Encounter: Payer: Self-pay | Admitting: Family Medicine

## 2024-08-28 ENCOUNTER — Ambulatory Visit: Admitting: Family Medicine

## 2024-08-28 VITALS — BP 127/77 | HR 80 | Ht 64.0 in | Wt 206.0 lb

## 2024-08-28 DIAGNOSIS — R319 Hematuria, unspecified: Secondary | ICD-10-CM | POA: Diagnosis not present

## 2024-08-28 DIAGNOSIS — R300 Dysuria: Secondary | ICD-10-CM | POA: Diagnosis not present

## 2024-08-28 LAB — POC URINALSYSI DIPSTICK (AUTOMATED)
Bilirubin, UA: NEGATIVE
Glucose, UA: NEGATIVE
Ketones, UA: NEGATIVE
Nitrite, UA: NEGATIVE
Protein, UA: NEGATIVE
Spec Grav, UA: <=1.005 — AB
Urobilinogen, UA: 0.2 U/dL — AB
pH, UA: 7

## 2024-08-28 MED ORDER — NITROFURANTOIN MONOHYD MACRO 100 MG PO CAPS: 100.0000 mg | ORAL_CAPSULE | Freq: Two times a day (BID) | ORAL | 0 refills | Status: AC

## 2024-08-28 MED ORDER — PHENAZOPYRIDINE HCL 100 MG PO TABS: 100.0000 mg | ORAL_TABLET | Freq: Three times a day (TID) | ORAL | 0 refills | Status: AC | PRN

## 2024-08-28 NOTE — Progress Notes (Signed)
 "  Acute Office Visit  Subjective:     Patient ID: [name], female    DOB: 09/01/1971, 53 y.o.   MRN: [id number]  Chief Complaint  Patient presents with   Dysuria    Dysuria    Patient is in today for dysuria.   Discussed the use of AI scribe software for clinical note transcription with the patient, who gave verbal consent to proceed.  History of Present Illness [name] is a 53 year old female with recurrent urinary tract infections and incontinence who presents with urinary symptoms.  She has been experiencing urinary symptoms for the past three days, including dysuria, increased frequency, and a sensation of pressure during urination. She describes a unique sensation of urine being 'pulled back up and down' and notes lacrimation during urination. No urgency, fever, or visible hematuria.  She has a history of recurrent urinary tract infections and incontinence, for which she has undergone procedures and follows with Urogynecology. Her last urinary tract infection was in September and was treated with Bactrim . She has no known allergies to antibiotics and has not experienced antibiotic resistance in the past.  She has a history of six back surgeries, which complicates her ability to discern new back pain. No history of kidney problems, and her last kidney function test in May was excellent. She mentions gaining weight and experiencing discomfort due to incontinence, which affects her daily life and clothing choices.  She is scheduled to start pelvic floor physical therapy in March and has not yet begun this treatment.    Review of Systems  Genitourinary:  Positive for dysuria.   All review of systems negative except what is listed in the HPI      Objective:    BP 127/77   Pulse 80   Ht 5' 4 (1.626 m)   Wt 206 lb (93.4 kg)   SpO2 99%   BMI 35.36 kg/m    Physical Exam Vitals reviewed.  Constitutional:      Appearance: Normal appearance.  Abdominal:      General: There is no distension.     Tenderness: There is no abdominal tenderness. There is no right CVA tenderness or left CVA tenderness.  Neurological:     Mental Status: She is alert and oriented to person, place, and time.  Psychiatric:        Mood and Affect: Mood normal.        Behavior: Behavior normal.        Thought Content: Thought content normal.        Judgment: Judgment normal.     Results for orders placed or performed in visit on 08/28/24  POCT Urinalysis Dipstick (Automated)  Result Value Ref Range   Color, UA yellow    Clarity, UA clear    Glucose, UA Negative Negative   Bilirubin, UA negative    Ketones, UA negative    Spec Grav, UA <=1.005 (A) 1.010 - 1.025   Blood, UA small    pH, UA 7.0 5.0 - 8.0   Protein, UA Negative Negative   Urobilinogen, UA 0.2 (A) 0.2 or 1.0 E.U./dL   Nitrite, UA negative    Leukocytes, UA Moderate (2+) (A) Negative        Assessment & Plan:   Problem List Items Addressed This Visit       Active Problems   Dysuria - Primary   Relevant Medications   nitrofurantoin , macrocrystal-monohydrate, (MACROBID ) 100 MG capsule   phenazopyridine  (PYRIDIUM ) 100  MG tablet   Other Relevant Orders   POCT Urinalysis Dipstick (Automated) (Completed)   Urine Culture   Urine Microscopic Only   Hematuria   Relevant Medications   nitrofurantoin , macrocrystal-monohydrate, (MACROBID ) 100 MG capsule   phenazopyridine  (PYRIDIUM ) 100 MG tablet   Other Relevant Orders   Urine Microscopic Only      Assessment & Plan  Symptoms of dysuria, frequency, and pressure for three days. Leukocytes and hematuria on UA noted.  - Prescribed Macrobid  (nitrofurantoin ) for current UTI. - Prescribed Pyridium  for symptomatic relief. - Ordered urine culture and micro - Advised follow-up with PCPafter completing antibiotics to check for persistent hematuria.       Meds ordered this encounter  Medications   nitrofurantoin , macrocrystal-monohydrate,  (MACROBID ) 100 MG capsule    Sig: Take 1 capsule (100 mg total) by mouth 2 (two) times daily for 5 days.    Dispense:  10 capsule    Refill:  0    Supervising Provider:   DOMENICA BLACKBIRD A [[id number]]   phenazopyridine  (PYRIDIUM ) 100 MG tablet    Sig: Take 1 tablet (100 mg total) by mouth 3 (three) times daily as needed for pain.    Dispense:  10 tablet    Refill:  0    Supervising Provider:   DOMENICA BLACKBIRD A [[id number]]    Return if symptoms worsen or fail to improve.  Waddell KATHEE Mon, NP   "

## 2024-08-30 LAB — URINALYSIS, MICROSCOPIC ONLY
Bacteria, UA: NONE SEEN /HPF
Hyaline Cast: NONE SEEN /LPF

## 2024-08-30 LAB — URINE CULTURE
MICRO NUMBER:: 17397050
SPECIMEN QUALITY:: ADEQUATE

## 2024-09-02 ENCOUNTER — Ambulatory Visit: Payer: Self-pay | Admitting: Family Medicine

## 2024-09-09 ENCOUNTER — Telehealth: Payer: Self-pay | Admitting: Obstetrics and Gynecology

## 2024-09-09 MED ORDER — METRONIDAZOLE 500 MG PO TABS: 500.0000 mg | ORAL_TABLET | Freq: Two times a day (BID) | ORAL | 0 refills | Status: AC

## 2024-09-09 NOTE — Telephone Encounter (Signed)
 Message received from front office: patient would like a prescription sent to her pharmacy for bacterial vaginosis.  claims she can not afford the co-payment for the office visit   Telephone call to patient, not in, left message for her to call the office.   Rx routed to pharmacy per protocol.

## 2024-09-10 ENCOUNTER — Ambulatory Visit: Admitting: Obstetrics and Gynecology

## 2024-09-10 ENCOUNTER — Ambulatory Visit: Admitting: Family Medicine

## 2024-09-11 ENCOUNTER — Telehealth: Payer: Self-pay

## 2024-09-11 NOTE — Progress Notes (Signed)
 Pharmacy Quality Measure Review   This patient is appearing on a report for being at risk of failing the adherence measure for diabetes (Ozempic ) medications this calendar year.   Medication: Ozempic  0.25-.5mg /dose (2mg /3ml) pen Last fill date: 07/15/2024 for 28 day supply   Ozempic  prescription was discontinued on 12/24 as patient reported that she isn't taking the medication. According to Dr.Gentry's note on 12/16, the patient lost her ozempic  and was advised by PCP to discontinue med until follow up with Dr.Kremer on 1/6 however the patient does prefer to remain off of the medication if diabetes is managed without it. The patient did not attend their 1/6 follow up visit due to copay costs.   Also noted was that the patients was placed on adherence report b/c pharmacy is billing 1 box (2mg ) as a 28 day supply when it should be a 56 day supply.    Heatherly Stenner Student-PharmD

## 2024-09-12 ENCOUNTER — Ambulatory Visit: Admitting: Family Medicine

## 2024-09-12 ENCOUNTER — Encounter: Payer: Self-pay | Admitting: Family Medicine

## 2024-09-12 VITALS — BP 124/70 | HR 72 | Temp 97.9°F | Ht 64.0 in | Wt 213.8 lb

## 2024-09-12 DIAGNOSIS — Z131 Encounter for screening for diabetes mellitus: Secondary | ICD-10-CM

## 2024-09-12 DIAGNOSIS — E039 Hypothyroidism, unspecified: Secondary | ICD-10-CM | POA: Diagnosis not present

## 2024-09-12 DIAGNOSIS — R635 Abnormal weight gain: Secondary | ICD-10-CM

## 2024-09-12 NOTE — Progress Notes (Signed)
 "  Established Patient Office Visit   Subjective:  Patient ID: [name], female    DOB: 1971-03-20  Age: 54 y.o. MRN: [id number]  Chief Complaint  Patient presents with   Follow-up    6 month follow up for DM, wants Thyroid  checked having chills fatigue and weight gain    HPI Encounter Diagnoses  Name Primary?   Adult hypothyroidism Yes   Weight gain    Screening for diabetes mellitus    Follow-up of above.  Continues with levothyroxine  75 mcg daily on a fasting stomach.  History of prediabetes/type 2 diabetes that had resolved with weight loss and Ozempic .  Had continued with 0.25 of Ozempic  up until the first part of last month she has regained some of the weight that she had lost on the Ozempic .  It had worked well for her in the past.  Recent UTI that had responded to treatment.  No longer with dysuria frequency or hematuria.   Review of Systems  Constitutional: Negative.   HENT: Negative.    Eyes:  Negative for blurred vision, discharge and redness.  Respiratory: Negative.    Cardiovascular: Negative.   Gastrointestinal:  Negative for abdominal pain.  Genitourinary: Negative.  Negative for dysuria, frequency, hematuria and urgency.  Musculoskeletal: Negative.  Negative for myalgias.  Skin:  Negative for rash.  Neurological:  Negative for tingling, loss of consciousness and weakness.  Endo/Heme/Allergies:  Negative for polydipsia.    Current Medications[1]   Objective:     BP 124/70 (BP Location: Left Arm, Patient Position: Sitting, Cuff Size: Large)   Pulse 72   Temp 97.9 F (36.6 C) (Oral)   Ht 5' 4 (1.626 m)   Wt 213 lb 12.8 oz (97 kg)   SpO2 98%   BMI 36.70 kg/m  Wt Readings from Last 3 Encounters:  09/12/24 213 lb 12.8 oz (97 kg)  08/28/24 206 lb (93.4 kg)  08/09/24 205 lb 12.8 oz (93.4 kg)      Physical Exam Constitutional:      General: She is not in acute distress.    Appearance: Normal appearance. She is not ill-appearing, toxic-appearing  or diaphoretic.  HENT:     Head: Normocephalic and atraumatic.     Right Ear: External ear normal.     Left Ear: External ear normal.     Mouth/Throat:     Mouth: Mucous membranes are moist.     Pharynx: Oropharynx is clear. No oropharyngeal exudate or posterior oropharyngeal erythema.  Eyes:     General: No scleral icterus.       Right eye: No discharge.        Left eye: No discharge.     Extraocular Movements: Extraocular movements intact.     Conjunctiva/sclera: Conjunctivae normal.     Pupils: Pupils are equal, round, and reactive to light.  Cardiovascular:     Rate and Rhythm: Normal rate and regular rhythm.  Pulmonary:     Effort: Pulmonary effort is normal. No respiratory distress.     Breath sounds: Normal breath sounds. No wheezing or rales.  Abdominal:     General: Bowel sounds are normal.     Tenderness: There is no right CVA tenderness or left CVA tenderness.  Musculoskeletal:     Cervical back: No rigidity or tenderness.  Lymphadenopathy:     Cervical: No cervical adenopathy.  Skin:    General: Skin is warm and dry.  Neurological:     Mental Status: She is alert and  oriented to person, place, and time.  Psychiatric:        Mood and Affect: Mood normal.        Behavior: Behavior normal.      No results found for any visits on 09/12/24.    The 10-year ASCVD risk score (Arnett DK, et al., 2019) is: 2.3%    Assessment & Plan:   Adult hypothyroidism -     TSH  Weight gain -     CBC with Differential/Platelet  Screening for diabetes mellitus -     Basic metabolic panel with GFR -     Hemoglobin A1c    Return in about 3 months (around 12/11/2024), or if symptoms worsen or fail to improve.    Elsie Sim Lent, MD    [1]  Current Outpatient Medications:    albuterol  (VENTOLIN  HFA) 108 (90 Base) MCG/ACT inhaler, Inhale 1-2 puffs into the lungs every 6 (six) hours as needed for wheezing or shortness of breath., Disp: 18 g, Rfl: 0   ALPRAZolam   (XANAX ) 0.5 MG tablet, Take 0.5 mg by mouth 3 (three) times daily as needed for anxiety., Disp: , Rfl:    ARIPiprazole  (ABILIFY ) 5 MG tablet, Take 1 tablet (5 mg total) by mouth daily., Disp: 30 tablet, Rfl: 2   aspirin -acetaminophen -caffeine (EXCEDRIN MIGRAINE) 250-250-65 MG tablet, Take 1 tablet by mouth every 6 (six) hours as needed for migraine., Disp: , Rfl:    atorvastatin  (LIPITOR) 20 MG tablet, TAKE 1 TABLET(20 MG) BY MOUTH DAILY, Disp: 60 tablet, Rfl: 0   blood glucose meter kit and supplies KIT, Dispense based on patient and insurance preference. Check glucose before breakfast. ICD: E11.65, Disp: 1 each, Rfl: 0   diclofenac  Sodium (VOLTAREN ) 1 % GEL, APPLY TWO GRAMS TOPICALLY DAILY AS NEEDED, Disp: 200 g, Rfl: 0   doxepin (SINEQUAN) 25 MG capsule, Take 25 mg by mouth at bedtime., Disp: , Rfl:    FINACEA 15 % FOAM, Apply 1 application  topically 2 (two) times daily as needed (rosacea)., Disp: , Rfl:    glucose blood test strip, Use as instructed, check glucose before breakfast. ICD: E11.65, Disp: 100 each, Rfl: 12   lamoTRIgine  (LAMICTAL ) 150 MG tablet, Take 300 mg by mouth daily., Disp: , Rfl:    Lancets (FREESTYLE) lancets, Use as instructed. E11.65, Disp: 100 each, Rfl: 12   levothyroxine  (SYNTHROID ) 75 MCG tablet, TAKE 1 TABLET (75 MCG TOTAL) BY MOUTH DAILY BEFORE BREAKFAST, Disp: 90 tablet, Rfl: 1   metroNIDAZOLE  (FLAGYL ) 500 MG tablet, Take 1 tablet (500 mg total) by mouth 2 (two) times daily., Disp: 14 tablet, Rfl: 0   pantoprazole  (PROTONIX ) 40 MG tablet, Take 1 tablet (40 mg total) by mouth daily., Disp: 90 tablet, Rfl: 1   phenazopyridine  (PYRIDIUM ) 100 MG tablet, Take 1 tablet (100 mg total) by mouth 3 (three) times daily as needed for pain., Disp: 10 tablet, Rfl: 0   QUEtiapine (SEROQUEL) 25 MG tablet, Take 25 mg by mouth at bedtime., Disp: , Rfl:   "

## 2024-09-13 ENCOUNTER — Ambulatory Visit: Payer: Self-pay | Admitting: Family Medicine

## 2024-09-13 LAB — CBC WITH DIFFERENTIAL/PLATELET
Basophils Absolute: 0.1 K/uL (ref 0.0–0.1)
Basophils Relative: 1.4 % (ref 0.0–3.0)
Eosinophils Absolute: 0.2 K/uL (ref 0.0–0.7)
Eosinophils Relative: 1.7 % (ref 0.0–5.0)
HCT: 38.8 % (ref 36.0–46.0)
Hemoglobin: 13 g/dL (ref 12.0–15.0)
Lymphocytes Relative: 28.3 % (ref 12.0–46.0)
Lymphs Abs: 2.8 K/uL (ref 0.7–4.0)
MCHC: 33.5 g/dL (ref 30.0–36.0)
MCV: 83.3 fl (ref 78.0–100.0)
Monocytes Absolute: 0.5 K/uL (ref 0.1–1.0)
Monocytes Relative: 5.2 % (ref 3.0–12.0)
Neutro Abs: 6.3 K/uL (ref 1.4–7.7)
Neutrophils Relative %: 63.4 % (ref 43.0–77.0)
Platelets: 247 K/uL (ref 150.0–400.0)
RBC: 4.66 Mil/uL (ref 3.87–5.11)
RDW: 15.4 % (ref 11.5–15.5)
WBC: 9.9 K/uL (ref 4.0–10.5)

## 2024-09-13 LAB — HEMOGLOBIN A1C: Hgb A1c MFr Bld: 5.3 % (ref 4.6–6.5)

## 2024-09-13 LAB — TSH: TSH: 2.33 u[IU]/mL (ref 0.35–5.50)

## 2024-09-13 LAB — BASIC METABOLIC PANEL WITH GFR
BUN: 17 mg/dL (ref 6–23)
CO2: 27 meq/L (ref 19–32)
Calcium: 9 mg/dL (ref 8.4–10.5)
Chloride: 104 meq/L (ref 96–112)
Creatinine, Ser: 0.67 mg/dL (ref 0.40–1.20)
GFR: 100.98 mL/min
Glucose, Bld: 92 mg/dL (ref 70–99)
Potassium: 4 meq/L (ref 3.5–5.1)
Sodium: 139 meq/L (ref 135–145)

## 2024-09-19 ENCOUNTER — Ambulatory Visit: Admitting: Physician Assistant

## 2024-09-23 ENCOUNTER — Other Ambulatory Visit: Payer: Self-pay

## 2024-09-23 DIAGNOSIS — E119 Type 2 diabetes mellitus without complications: Secondary | ICD-10-CM

## 2024-09-23 NOTE — Progress Notes (Unsigned)
" ° °  09/23/24 Name: [name] MRN: [id number] DOB: [birth year]  [name] is a 54 y.o. year old female who presented for a telephone follow-up visit.   They were referred to the pharmacist by a quality report for assistance in managing diabetes.   Subjective:  Care Team: Primary Care Provider: Berneta Elsie Sayre, MD ; Next Scheduled Visit: 12/19/24  Diabetes: Current medications: none -Medications tried in the past: metformin  stopped based on diabetes control with Ozempic ; however, patient states she lost her Ozempic  and was advised by PCP to continue not to use until follow-up with Dr. Berneta in January -Patient states she has not had Ozempic  in at least 1 month now -Patient would like to resume Ozempic , and endorses recent weight gain that had led to snoring and is suspicious of OSA  Macrovascular and Microvascular Risk Reduction:  Statin? yes (atorvastatin  20mg  daily ) ACEi/ARB? no; therapy not indicated  Last urinary albumin/creatinine ratio:  None on file Last eye exam:  Lab Results  Component Value Date   HMDIABEYEEXA No Retinopathy 10/10/2023   Last foot exam: 12/21/2022 Tobacco Use:  Tobacco Use: Medium Risk (09/12/2024)   Patient History    Smoking Tobacco Use: Former    Smokeless Tobacco Use: Never    Passive Exposure: Not on file   Objective:  Lab Results  Component Value Date   HGBA1C 5.3 09/12/2024   Lab Results  Component Value Date   CREATININE 0.67 09/12/2024   BUN 17 09/12/2024   NA 139 09/12/2024   K 4.0 09/12/2024   CL 104 09/12/2024   CO2 27 09/12/2024   Lab Results  Component Value Date   CHOL 135 10/29/2021   HDL 38.30 (L) 10/29/2021   LDLCALC 76 10/29/2021   LDLDIRECT 85.0 07/13/2023   TRIG 103.0 10/29/2021   CHOLHDL 4 10/29/2021   Assessment/Plan:   Diabetes: -Currently controlled; goal A1c <7%. Cardiorenal risk reduction is optimized.. Blood pressure is at goal <130/80. LDL is not at goal.  -Resume Ozempic  at 0.25mg  weekly  (patient agrees)- order pending for Dr. Berneta to sign if in agreement -Recommend patient monitor at least FBG daily; order pending for Accu Chek Guide supplies (test claim shows $0 copay for all) since patient's home glucometer has quit working -I recommend not only follow-up A1c at PCP visit in April, but also UACR and lipids.  If LDL still >70, consider increasing atorvastatin  to 40mg  daily  Follow Up Plan: 4 weeks  [name], PharmD, DPLA    "

## 2024-09-24 MED ORDER — OZEMPIC (0.25 OR 0.5 MG/DOSE) 2 MG/3ML ~~LOC~~ SOPN: PEN_INJECTOR | SUBCUTANEOUS | 1 refills | Status: AC

## 2024-09-24 MED ORDER — ACCU-CHEK GUIDE ME W/DEVICE KIT: PACK | 0 refills | Status: AC

## 2024-09-24 MED ORDER — ACCU-CHEK SOFTCLIX LANCETS MISC: 12 refills | Status: AC

## 2024-09-24 MED ORDER — ACCU-CHEK GUIDE TEST VI STRP: ORAL_STRIP | 12 refills | Status: AC

## 2024-09-26 ENCOUNTER — Ambulatory Visit: Admitting: Orthopaedic Surgery

## 2024-10-02 ENCOUNTER — Telehealth: Payer: Self-pay

## 2024-10-02 DIAGNOSIS — K219 Gastro-esophageal reflux disease without esophagitis: Secondary | ICD-10-CM

## 2024-10-02 MED ORDER — PANTOPRAZOLE SODIUM 40 MG PO TBEC: 40.0000 mg | DELAYED_RELEASE_TABLET | Freq: Every day | ORAL | 1 refills | Status: AC

## 2024-10-02 NOTE — Telephone Encounter (Signed)
 Copied from CRM [id number]. Topic: Clinical - Medication Refill >> Oct 02, 2024 11:19 AM Burnard DEL wrote: Medication: pantoprazole  (PROTONIX ) 40 MG tablet  Has the patient contacted their pharmacy? No (Agent: If no, request that the patient contact the pharmacy for the refill. If patient does not wish to contact the pharmacy document the reason why and proceed with request.) (Agent: If yes, when and what did the pharmacy advise?)  This is the patient's preferred pharmacy:  [facility] - [place], [place] - [address] [place] [address] Phone: [phone] Fax: [phone]  Is this the correct pharmacy for this prescription? Yes If no, delete pharmacy and type the correct one.   Has the prescription been filled recently? No  Is the patient out of the medication? Yes  Has the patient been seen for an appointment in the last year OR does the patient have an upcoming appointment? Yes  Can we respond through MyChart? Yes  Agent: Please be advised that Rx refills may take up to 3 business days. We ask that you follow-up with your pharmacy.

## 2024-10-21 ENCOUNTER — Other Ambulatory Visit

## 2024-11-05 ENCOUNTER — Ambulatory Visit

## 2024-11-07 ENCOUNTER — Encounter: Admitting: Physical Therapy

## 2024-11-14 ENCOUNTER — Encounter: Admitting: Physical Therapy

## 2024-11-21 ENCOUNTER — Encounter: Admitting: Physical Therapy

## 2024-11-28 ENCOUNTER — Encounter: Admitting: Physical Therapy

## 2024-12-05 ENCOUNTER — Encounter: Admitting: Physical Therapy

## 2024-12-19 ENCOUNTER — Ambulatory Visit: Admitting: Family Medicine
